# Patient Record
Sex: Male | Born: 1935 | Race: White | Hispanic: No | Marital: Married | Smoking: Never smoker
Health system: Southern US, Community
[De-identification: ages and names within clinical notes are randomized; demographics above are authoritative.]

## PROBLEM LIST (undated history)

## (undated) DIAGNOSIS — G250 Essential tremor: Secondary | ICD-10-CM

## (undated) DIAGNOSIS — C61 Malignant neoplasm of prostate: Secondary | ICD-10-CM

## (undated) DIAGNOSIS — Z950 Presence of cardiac pacemaker: Secondary | ICD-10-CM

## (undated) DIAGNOSIS — I509 Heart failure, unspecified: Secondary | ICD-10-CM

## (undated) DIAGNOSIS — N1330 Unspecified hydronephrosis: Secondary | ICD-10-CM

## (undated) DIAGNOSIS — Z8679 Personal history of other diseases of the circulatory system: Secondary | ICD-10-CM

## (undated) DIAGNOSIS — I4819 Other persistent atrial fibrillation: Secondary | ICD-10-CM

## (undated) DIAGNOSIS — I495 Sick sinus syndrome: Secondary | ICD-10-CM

## (undated) DIAGNOSIS — N289 Disorder of kidney and ureter, unspecified: Secondary | ICD-10-CM

## (undated) DIAGNOSIS — I4510 Unspecified right bundle-branch block: Secondary | ICD-10-CM

## (undated) DIAGNOSIS — D649 Anemia, unspecified: Secondary | ICD-10-CM

## (undated) DIAGNOSIS — R42 Dizziness and giddiness: Secondary | ICD-10-CM

## (undated) DIAGNOSIS — K5730 Diverticulosis of large intestine without perforation or abscess without bleeding: Secondary | ICD-10-CM

## (undated) DIAGNOSIS — Z9889 Other specified postprocedural states: Secondary | ICD-10-CM

## (undated) HISTORY — DX: Anemia, unspecified: D64.9

## (undated) HISTORY — PX: TONSILLECTOMY: SUR1361

## (undated) HISTORY — DX: Heart failure, unspecified: I50.9

## (undated) HISTORY — PX: CATARACT EXTRACTION: SUR2

## (undated) HISTORY — PX: CARDIAC ELECTROPHYSIOLOGY MAPPING AND ABLATION: SHX1292

## (undated) HISTORY — PX: CARDIOVERSION: SHX1299

## (undated) HISTORY — DX: Dizziness and giddiness: R42

---

## 2000-10-23 ENCOUNTER — Encounter (HOSPITAL_COMMUNITY): Admission: RE | Admit: 2000-10-23 | Discharge: 2001-01-21 | Payer: Self-pay | Admitting: Geriatric Medicine

## 2000-10-24 ENCOUNTER — Encounter: Payer: Self-pay | Admitting: Geriatric Medicine

## 2000-10-27 ENCOUNTER — Encounter: Payer: Self-pay | Admitting: Geriatric Medicine

## 2002-07-22 ENCOUNTER — Ambulatory Visit (HOSPITAL_COMMUNITY): Admission: RE | Admit: 2002-07-22 | Discharge: 2002-07-22 | Payer: Self-pay | Admitting: Gastroenterology

## 2003-06-03 ENCOUNTER — Ambulatory Visit: Admission: RE | Admit: 2003-06-03 | Discharge: 2003-06-03 | Payer: Self-pay | Admitting: Geriatric Medicine

## 2003-06-03 ENCOUNTER — Encounter (INDEPENDENT_AMBULATORY_CARE_PROVIDER_SITE_OTHER): Payer: Self-pay | Admitting: Cardiology

## 2003-08-25 ENCOUNTER — Ambulatory Visit: Admission: RE | Admit: 2003-08-25 | Discharge: 2003-09-28 | Payer: Self-pay | Admitting: Radiation Oncology

## 2003-10-28 ENCOUNTER — Ambulatory Visit: Admission: RE | Admit: 2003-10-28 | Discharge: 2004-01-19 | Payer: Self-pay | Admitting: Radiation Oncology

## 2003-12-21 ENCOUNTER — Ambulatory Visit (HOSPITAL_BASED_OUTPATIENT_CLINIC_OR_DEPARTMENT_OTHER): Admission: RE | Admit: 2003-12-21 | Discharge: 2003-12-21 | Payer: Self-pay | Admitting: Urology

## 2003-12-21 ENCOUNTER — Ambulatory Visit (HOSPITAL_COMMUNITY): Admission: RE | Admit: 2003-12-21 | Discharge: 2003-12-21 | Payer: Self-pay | Admitting: Urology

## 2003-12-21 HISTORY — PX: OTHER SURGICAL HISTORY: SHX169

## 2006-02-11 ENCOUNTER — Encounter: Admission: RE | Admit: 2006-02-11 | Discharge: 2006-02-27 | Payer: Self-pay | Admitting: Orthopedic Surgery

## 2006-09-04 ENCOUNTER — Ambulatory Visit (HOSPITAL_COMMUNITY): Admission: RE | Admit: 2006-09-04 | Discharge: 2006-09-04 | Payer: Self-pay | Admitting: Cardiology

## 2006-10-27 ENCOUNTER — Inpatient Hospital Stay (HOSPITAL_COMMUNITY): Admission: AD | Admit: 2006-10-27 | Discharge: 2006-10-27 | Payer: Self-pay | Admitting: Cardiology

## 2006-11-22 ENCOUNTER — Encounter: Admission: RE | Admit: 2006-11-22 | Discharge: 2006-11-22 | Payer: Self-pay | Admitting: Otolaryngology

## 2006-12-19 ENCOUNTER — Encounter: Admission: RE | Admit: 2006-12-19 | Discharge: 2006-12-19 | Payer: Self-pay | Admitting: Cardiology

## 2006-12-22 ENCOUNTER — Ambulatory Visit (HOSPITAL_COMMUNITY): Admission: RE | Admit: 2006-12-22 | Discharge: 2006-12-22 | Payer: Self-pay | Admitting: Cardiology

## 2006-12-22 HISTORY — PX: CARDIAC CATHETERIZATION: SHX172

## 2007-02-11 HISTORY — PX: KNEE ARTHROSCOPY: SUR90

## 2007-03-04 ENCOUNTER — Ambulatory Visit: Payer: Self-pay | Admitting: Internal Medicine

## 2007-04-02 ENCOUNTER — Ambulatory Visit: Payer: Self-pay | Admitting: Internal Medicine

## 2007-04-02 LAB — CONVERTED CEMR LAB
BUN: 25 mg/dL — ABNORMAL HIGH (ref 6–23)
Basophils Absolute: 0 10*3/uL (ref 0.0–0.1)
Basophils Relative: 0.6 % (ref 0.0–1.0)
Calcium: 9 mg/dL (ref 8.4–10.5)
Chloride: 106 meq/L (ref 96–112)
Creatinine, Ser: 1.2 mg/dL (ref 0.4–1.5)
Eosinophils Relative: 2 % (ref 0.0–5.0)
GFR calc Af Amer: 76 mL/min
GFR calc non Af Amer: 63 mL/min
Glucose, Bld: 176 mg/dL — ABNORMAL HIGH (ref 70–99)
HCT: 41.7 % (ref 39.0–52.0)
Hemoglobin: 14 g/dL (ref 13.0–17.0)
INR: 2.5 — ABNORMAL HIGH (ref 0.8–1.0)
MCHC: 33.6 g/dL (ref 30.0–36.0)
Monocytes Absolute: 0.3 10*3/uL (ref 0.2–0.7)
Neutro Abs: 5.6 10*3/uL (ref 1.4–7.7)
Neutrophils Relative %: 72.6 % (ref 43.0–77.0)
Sodium: 138 meq/L (ref 135–145)

## 2007-04-06 ENCOUNTER — Ambulatory Visit: Payer: Self-pay | Admitting: Internal Medicine

## 2007-04-06 ENCOUNTER — Inpatient Hospital Stay (HOSPITAL_COMMUNITY): Admission: RE | Admit: 2007-04-06 | Discharge: 2007-04-07 | Payer: Self-pay | Admitting: Internal Medicine

## 2007-04-08 ENCOUNTER — Ambulatory Visit: Payer: Self-pay | Admitting: Internal Medicine

## 2007-04-27 ENCOUNTER — Ambulatory Visit: Payer: Self-pay | Admitting: Internal Medicine

## 2007-04-27 LAB — CONVERTED CEMR LAB
Creatinine, Ser: 1.2 mg/dL (ref 0.4–1.5)
Eosinophils Absolute: 0.1 10*3/uL (ref 0.0–0.7)
GFR calc Af Amer: 76 mL/min
GFR calc non Af Amer: 63 mL/min
Hemoglobin: 14.2 g/dL (ref 13.0–17.0)
Lymphocytes Relative: 21.2 % (ref 12.0–46.0)
MCHC: 32.9 g/dL (ref 30.0–36.0)
Monocytes Absolute: 0.3 10*3/uL (ref 0.1–1.0)
Neutro Abs: 4.7 10*3/uL (ref 1.4–7.7)
Platelets: 181 10*3/uL (ref 150–400)
WBC: 6.6 10*3/uL (ref 4.5–10.5)
aPTT: 40.7 s — ABNORMAL HIGH (ref 21.7–29.8)

## 2007-05-05 ENCOUNTER — Ambulatory Visit (HOSPITAL_COMMUNITY): Admission: RE | Admit: 2007-05-05 | Discharge: 2007-05-06 | Payer: Self-pay | Admitting: Internal Medicine

## 2007-05-05 ENCOUNTER — Ambulatory Visit: Payer: Self-pay | Admitting: Internal Medicine

## 2007-05-05 HISTORY — PX: PACEMAKER INSERTION: SHX728

## 2007-05-21 ENCOUNTER — Ambulatory Visit: Payer: Self-pay

## 2007-09-22 ENCOUNTER — Ambulatory Visit: Payer: Self-pay | Admitting: Internal Medicine

## 2007-10-16 ENCOUNTER — Encounter: Admission: RE | Admit: 2007-10-16 | Discharge: 2007-10-16 | Payer: Self-pay | Admitting: Neurology

## 2007-10-20 ENCOUNTER — Encounter: Admission: RE | Admit: 2007-10-20 | Discharge: 2007-10-20 | Payer: Self-pay | Admitting: Neurology

## 2007-11-02 ENCOUNTER — Encounter: Admission: RE | Admit: 2007-11-02 | Discharge: 2007-11-02 | Payer: Self-pay | Admitting: Neurosurgery

## 2007-12-01 ENCOUNTER — Encounter: Admission: RE | Admit: 2007-12-01 | Discharge: 2007-12-01 | Payer: Self-pay | Admitting: Neurosurgery

## 2007-12-16 ENCOUNTER — Encounter (INDEPENDENT_AMBULATORY_CARE_PROVIDER_SITE_OTHER): Payer: Self-pay | Admitting: Neurosurgery

## 2007-12-16 ENCOUNTER — Inpatient Hospital Stay (HOSPITAL_COMMUNITY): Admission: RE | Admit: 2007-12-16 | Discharge: 2007-12-20 | Payer: Self-pay | Admitting: Neurosurgery

## 2007-12-16 HISTORY — PX: SUBDURAL HEMATOMA EVACUATION VIA CRANIOTOMY: SUR319

## 2008-01-01 ENCOUNTER — Ambulatory Visit: Payer: Self-pay | Admitting: Internal Medicine

## 2008-03-25 ENCOUNTER — Encounter: Admission: RE | Admit: 2008-03-25 | Discharge: 2008-03-25 | Payer: Self-pay | Admitting: Family Medicine

## 2008-05-02 ENCOUNTER — Encounter: Payer: Self-pay | Admitting: Internal Medicine

## 2008-05-12 ENCOUNTER — Encounter: Payer: Self-pay | Admitting: Cardiology

## 2008-05-12 ENCOUNTER — Ambulatory Visit: Payer: Self-pay | Admitting: Internal Medicine

## 2008-05-12 DIAGNOSIS — I495 Sick sinus syndrome: Secondary | ICD-10-CM | POA: Insufficient documentation

## 2008-05-12 DIAGNOSIS — I4891 Unspecified atrial fibrillation: Secondary | ICD-10-CM

## 2008-06-26 ENCOUNTER — Ambulatory Visit: Payer: Self-pay | Admitting: Internal Medicine

## 2008-06-27 ENCOUNTER — Ambulatory Visit: Payer: Self-pay | Admitting: Internal Medicine

## 2008-09-13 ENCOUNTER — Telehealth: Payer: Self-pay | Admitting: Internal Medicine

## 2008-09-16 ENCOUNTER — Ambulatory Visit: Payer: Self-pay | Admitting: Internal Medicine

## 2008-09-16 ENCOUNTER — Encounter: Payer: Self-pay | Admitting: Internal Medicine

## 2008-09-16 LAB — CONVERTED CEMR LAB
BUN: 23 mg/dL (ref 6–23)
Chloride: 107 meq/L (ref 96–112)
Glucose, Bld: 67 mg/dL — ABNORMAL LOW (ref 70–99)
Magnesium: 2.1 mg/dL (ref 1.5–2.5)
Potassium: 4.7 meq/L (ref 3.5–5.1)

## 2008-09-26 ENCOUNTER — Ambulatory Visit: Payer: Self-pay | Admitting: Internal Medicine

## 2008-12-01 ENCOUNTER — Ambulatory Visit (HOSPITAL_COMMUNITY): Admission: RE | Admit: 2008-12-01 | Discharge: 2008-12-01 | Payer: Self-pay | Admitting: Gastroenterology

## 2008-12-01 HISTORY — PX: COLONOSCOPY W/ POLYPECTOMY: SHX1380

## 2008-12-26 ENCOUNTER — Ambulatory Visit: Payer: Self-pay | Admitting: Internal Medicine

## 2009-03-27 ENCOUNTER — Ambulatory Visit: Payer: Self-pay | Admitting: Internal Medicine

## 2009-04-25 ENCOUNTER — Ambulatory Visit: Payer: Self-pay | Admitting: Internal Medicine

## 2009-04-25 DIAGNOSIS — I442 Atrioventricular block, complete: Secondary | ICD-10-CM | POA: Insufficient documentation

## 2009-06-26 ENCOUNTER — Encounter: Payer: Self-pay | Admitting: Internal Medicine

## 2009-09-25 ENCOUNTER — Ambulatory Visit: Payer: Self-pay | Admitting: Internal Medicine

## 2009-12-25 ENCOUNTER — Ambulatory Visit: Payer: Self-pay | Admitting: Internal Medicine

## 2010-02-13 NOTE — Cardiovascular Report (Signed)
Summary: TTM  TTM   Imported By: Roderic Ovens 02/03/2009 12:36:52  _____________________________________________________________________  External Attachment:    Type:   Image     Comment:   External Document

## 2010-02-13 NOTE — Assessment & Plan Note (Signed)
Summary: pacer check.gdt.amber  Medications Added ASPIRIN 81 MG TBEC (ASPIRIN) Take one tablet by mouth daily CIALIS 10 MG TABS (TADALAFIL) as needed      Allergies Added: NKDA  Primary Provider:  Hal Stoneking   History of Present Illness: [name] is seen in followup for pacemaker implanted for tachybradycardia syndrome. He is status post flutter ablation. He has some atrial fibrillation for which he takes flecainide. He had no symptoms of palpitations.  He has no complaints of chest pain or shortness of breath .  He is tolerating his medications well.  Current Medications (verified): 1)  Synthroid 125 Mcg Tabs (Levothyroxine Sodium) .... Take 1 By Mouth Once Daily 2)  Flecainide Acetate 100 Mg Tabs (Flecainide Acetate) .... Two Times A Day 3)  Pindolol 5 Mg Tabs (Pindolol) .... 1/2 Tab Two Times A Day 4)  Multivitamins  Tabs (Multiple Vitamin) .Marland Kitchen.. 1 Tab Once Daily 5)  Omega-3 350 Mg Caps (Omega-3 Fatty Acids) 6)  Glucosamine-Chondroitin  Caps (Glucosamine-Chondroit-Vit C-Mn) .... Take 1 By Mouth Two Times A Day 7)  Aspirin 81 Mg Tbec (Aspirin) .... Take One Tablet By Mouth Daily 8)  Cialis 10 Mg Tabs (Tadalafil) .... As Needed  Allergies (verified): No Known Drug Allergies  Vital Signs:  Patient profile:   77 year old male Height:      72 inches Weight:      179 pounds BMI:     24.36 Pulse rate:   60 / minute BP sitting:   100 / 65  (right arm) Cuff size:   regular  Vitals Entered By: [name], RMA (April 25, 2009 10:42 AM)  Physical Exam  General:  The patient was alert and oriented in no acute distress. HEENT Normal.  Neck veins were flat, carotids were brisk.  Lungs were clear.  Heart sounds were regular without murmurs or gallops.  Abdomen was soft with active bowel sounds. There is no clubbing cyanosis or edema. Skin Warm and dry    PPM Specifications Following MD:  Sherryl Manges, MD     [facility] Vendor:  [employer]     PPM Model Number:   [id number]     PPM Serial Number:  [id number] PPM DOI:  05/05/2007     PPM Implanting MD:  Sherryl Manges, MD  Lead 1    Location: RA     DOI: 05/05/2007     Model #: 0981     Serial #: [id number]     Status: active Lead 2    Location: RV     DOI: 05/05/2007     Model #: [id number]     Serial #: [id number]     Status: active  Magnet Response Rate:  BOL 100 ERI 85  Indications:  BRADYCARDIA   PPM Follow Up Remote Check?  No Battery Voltage:  good V     Battery Est. Longevity:  > 5 years     Pacer Dependent:  Yes       PPM Device Measurements Atrium  Impedance: 470 ohms, Threshold: 0.6 V at 0.4 msec Right Ventricle  Impedance: 540 ohms, Threshold: 0.7 V at 0.4 msec  Episodes MS Episodes:  0     Coumadin:  No Atrial Pacing:  89%     Ventricular Pacing:  92%  Parameters Mode:  DDDR     Lower Rate Limit:  60     Upper Rate Limit:  110 Paced AV Delay:  250     Next Cardiology Appt Due:  10/14/2009 Tech Comments:  No parameter changes.  Device function normal. Checked by industry.  TTM's with [employer].  ROV 6 months clinic. Altha Harm, LPN  April 25, 2009 11:00 AM   Impression & Recommendations:  Problem # 1:  ATRIAL FIBRILLATION (ICD-427.31) The patient has some atrial fibrillation comprising about 11% based on historogrAM/ device data at this point the options would include withdrawing the flecainide to see whether the frequency increased and if there are any associated symptoms, increase the drug to see if we could further suppress or to continue as we are. Given the paucity of symptoms he would like to do nothing.   His updated medication list for this problem includes:    Flecainide Acetate 100 Mg Tabs (Flecainide acetate) .Marland Kitchen..Marland Kitchen Two times a day    Pindolol 5 Mg Tabs (Pindolol) .Marland Kitchen... 1/2 tab two times a day    Aspirin 81 Mg Tbec (Aspirin) .Marland Kitchen... Take one tablet by mouth daily  Problem # 2:  PACEMAKER, PERMANENT-GUIDANT INSIGNIA 1297 (ICD-V45.01) Device parameters and data were reviewed and no  changes were made  Problem # 3:  AV BLOCK, COMPLETE (ICD-426.0) he has had progressive conduction system disease and now has no intrinsic rhythm at least during sinus. It appears that he conducts from atrium to the ventricle  during atrial fibrillation His updated medication list for this problem includes:    Flecainide Acetate 100 Mg Tabs (Flecainide acetate) .Marland Kitchen..Marland Kitchen Two times a day    Pindolol 5 Mg Tabs (Pindolol) .Marland Kitchen... 1/2 tab two times a day    Aspirin 81 Mg Tbec (Aspirin) .Marland Kitchen... Take one tablet by mouth daily

## 2010-02-13 NOTE — Cardiovascular Report (Signed)
Summary: Office Visit    Office Visit    Imported By: Roderic Ovens 04/25/2009 12:42:13  _____________________________________________________________________  External Attachment:    Type:   Image     Comment:   External Document

## 2010-02-13 NOTE — Cardiovascular Report (Signed)
Summary: TTM   TTM   Imported By: Roderic Ovens 07/24/2009 14:46:39  _____________________________________________________________________  External Attachment:    Type:   Image     Comment:   External Document

## 2010-02-13 NOTE — Cardiovascular Report (Signed)
Summary: TTM   TTM   Imported By: Roderic Ovens 04/05/2009 16:05:36  _____________________________________________________________________  External Attachment:    Type:   Image     Comment:   External Document

## 2010-02-13 NOTE — Cardiovascular Report (Signed)
Summary: TTM   TTM   Imported By: Roderic Ovens 10/25/2009 16:13:42  _____________________________________________________________________  External Attachment:    Type:   Image     Comment:   External Document

## 2010-02-15 NOTE — Cardiovascular Report (Signed)
Summary: TTM   TTM   Imported By: Roderic Ovens 01/12/2010 11:43:04  _____________________________________________________________________  External Attachment:    Type:   Image     Comment:   External Document

## 2010-02-16 NOTE — Cardiovascular Report (Signed)
Summary: TTM   TTM   Imported By: Roderic Ovens 07/24/2009 14:48:13  _____________________________________________________________________  External Attachment:    Type:   Image     Comment:   External Document

## 2010-03-26 ENCOUNTER — Encounter: Payer: Self-pay | Admitting: Internal Medicine

## 2010-03-26 DIAGNOSIS — I495 Sick sinus syndrome: Secondary | ICD-10-CM

## 2010-04-04 ENCOUNTER — Ambulatory Visit: Payer: Self-pay

## 2010-04-12 NOTE — Cardiovascular Report (Signed)
Summary: TTM   TTM   Imported By: Roderic Ovens 04/06/2010 14:42:56  _____________________________________________________________________  External Attachment:    Type:   Image     Comment:   External Document

## 2010-04-18 ENCOUNTER — Other Ambulatory Visit: Payer: Self-pay | Admitting: Internal Medicine

## 2010-05-18 ENCOUNTER — Other Ambulatory Visit: Payer: Self-pay | Admitting: Internal Medicine

## 2010-05-29 NOTE — Cardiovascular Report (Signed)
NAME:  [name], VISCOMI NO.:  [id number]   MEDICAL RECORD NO.:  [id number]          PATIENT TYPE:  OIB   LOCATION:  2854                         FACILITY:  [facility]   PHYSICIAN:  Armanda Magic, M.D.     DATE OF BIRTH:  28-Jun-1931   DATE OF PROCEDURE:  12/22/2006  DATE OF DISCHARGE:                            CARDIAC CATHETERIZATION   PROBLEMS INCLUDE:  1. Atrial fibrillation.  2. Chronic right bundle branch block.  3. Hypothyroidism.  4. Prostate CA.  5. Asymptomatic bradycardia.   PROCEDURE:  1. Left heart catheterization.  2. Coronary angiography.  3. Left ventriculography.   OPERATOR:  Armanda Magic, M.D.   INDICATIONS:  Atrial fibrillation, abnormal Cardiolite with thick  inferior wall defect.   COMPLICATIONS:  None.   IV ACCESS:  Via right femoral artery 6-French sheath.   IV MEDICATIONS:  Fentanyl 25 mcg IV.   This is a very pleasant 77 year old male with a history of prior chronic  right bundle branch block as well as paroxysmal atrial fibrillation  which has now become chronic atrial fibrillation.  He has had problems  with bradycardia in the past, and is now undergoing cardiac  catheterization to evaluate his inferior wall defect on Cardiolite prior  to starting him on flecainide.   The patient is brought to the cardiac catheterization laboratory in the  fasting nonsedated state.  Informed consent was obtained.  The patient  was connected to continuous heart rate pulse oximetry monitoring and  blood pressure monitoring.  The right groin was prepped and draped in a  sterile fashion.  Xylocaine 1% was used for local anesthesia.  Using  modified Seldinger technique a 6-French sheath was placed in the right  femoral artery.  Under fluoroscopic guidance a 6-French JL-4 catheter  was placed in the left coronary artery.  Multiple cine films were taken  at 30-degree RAO, and 40-degree LAO views.  His catheter was then  exchanged out over a guidewire for  a 6-French JR-4 catheter which was  placed under fluoroscopic guidance in the right coronary artery.  Multiple cine films were taken at 30-degree RAO, and 40-degree LAO  views.  The catheter was then exchanged out over a guidewire for a 6-  French angle pigtail catheter which was placed under fluoroscopic  guidance into the left ventricular cavity.   A left ventriculography was performed in the 30-degree RAO view using a  total of 30 mL of contrast at 15 mL per second.  The catheter was then  pulled back across the aortic valve with no significant gradient noted.  At the end of the procedure all catheters and sheaths were removed.  Manual compression was performed until adequate hemostasis was obtained.  The patient was transferred back to his room in stable condition.   RESULTS:  1. The left main coronary artery is widely patent and bifurcates into      the left anterior descending artery and left circumflex artery.  2. The left anterior descending artery is widely patent throughout its      course.  The apex gives rise to a first  diagonal branch which is      widely patent, and bifurcates into daughter branches both of which      are widely patent.  3. The left circumflex is widely patent throughout its course in the      AV groove.  It gives rise to a first small obtuse marginal branch,      and a second larger obtuse marginal II branch which bifurcates into      2 daughter branches and is widely patent.  It then gives off 3      other obtuse marginal branches all of which are widely patent.  4. The right coronary artery has luminal irregularities up to 20%, and      distally bifurcates into a posterior descending artery and      posterior lateral artery both of which are widely patent.   LEFT VENTRICULOGRAPHY:  Left ventriculography shows normal low normal LV  function EF 50%, aortic pressure 133/86 mmHg, LV pressure 132/5 mmHg,  LVEDP 5 mmHg.   ASSESSMENT:  1. Nonobstructive  coronary disease.  2. Low normal LV function EF 50% by cath, but echo and Cardiolite      showed EF well above 55%.  3. Atrial fibrillation rate control.   PLAN:  Restart Coumadin. discharge home after IV fluid and bedrest.  Will start flecainide as an outpatient at 50 mg b.i.d. and Pindolol 5 mg  a day for atrial fibrillation once INR is greater than 2 for four weeks  after restarting Coumadin.   FOLLOWUP:  With me in 4 weeks.      Armanda Magic, M.D.  Electronically Signed     TT/MEDQ  D:  12/22/2006  T:  12/22/2006  Job:  [id number]   cc:   Hal T. Stoneking, M.D.

## 2010-05-29 NOTE — Discharge Summary (Signed)
NAME:  [name], [name] NO.:  [id number]   MEDICAL RECORD NO.:  [id number]          PATIENT TYPE:  OIB   LOCATION:  6526                         FACILITY:  [facility]   PHYSICIAN:  Maple Mirza, PA   DATE OF BIRTH:  1931/09/28   DATE OF ADMISSION:  05/05/2007  DATE OF DISCHARGE:  05/06/2007                               DISCHARGE SUMMARY   ALLERGIES:  No known drug allergies.   Time for this dictation greater than 35 minutes.   FINAL DIAGNOSES:  1. Bradycardia after atrial flutter ablation March 2009, which is      symptomatic with fatigue and lethargy.  2. Discharging day one status post pacemaker implantation Medtronic      Insignia I dual-chamber pacemaker.  3. Chronic right bundle-branch block.   SECONDARY DIAGNOSES:  1. History of paroxysmal atrial fibrillation/flecainide therapy.  2. Iatrogenic hypothyroidism, treated.  3. History of prostate cancer.  4. Left heart catheterization December 22, 2006.  The LAD is patent.      The left circumflex is patent.  The right coronary artery has      luminal irregularities of about 22%.  Ejection fraction 50%.   PROCEDURE:  May 05, 2007, implant of the Medtronic [place] I dual-  chamber pacemaker, Dr. Sherryl Manges.  The patient has had no  postprocedural complications.  No hematoma at the pocket.  The device  interrogation well and chest x-ray shows no pneumothorax and the leads  are in appropriate position.  Of note, the patient has had the atrial  flutter ablation in the past.  This was done on April 06, 2007, Dr.  Sherryl Manges.  Even at that time, the patient did have bradycardia  status post ablation with a low steady recovery of sinus node function  over the ensuing 16 hours.   DISCHARGE MEDICATIONS:  The patient discharging on the following  medications:  1. Coumadin as before this admission.  2. Synthroid 112 mcg daily.  3. Flecainide 100 mg twice daily.  4. Pindolol 5 mg tablets one-half tablet twice  daily.  5. Multivitamin daily.  6. Lycopene 143 mg daily.  7. Omega-3 fatty acid daily.   He is asked to keep his incision dry for the next 7 days and to sponge  bathe until Tuesday May 12, 2007.  He follows up at [facility] Thursday May 21, 2007.  He  sees Dr. Graciela Husbands Tuesday August 25, 2007, at 9:40.   LAB WORK:  Pertinent to this admission was drawn on April 28, 2007.  White cells was 6.6, hemoglobin 14.2, hematocrit 43, and platelets  181,000.  Protime is 22.2, INR is 2, sodium is 140, potassium 4.7,  chloride 106, carbonate 27, glucose 103, BUN is 26, and creatinine 1.2.      Maple Mirza, PA     GM/MEDQ  D:  05/06/2007  T:  05/07/2007  Job:  [id number]   cc:   Duke Salvia, MD, [facility]  Hal T. Stoneking, M.D.

## 2010-05-29 NOTE — Assessment & Plan Note (Signed)
[facility]                         ELECTROPHYSIOLOGY OFFICE NOTE   NAME:[name], [name]                      MRN:          [id number]  DATE:04/27/2007                            DOB:          [birth year]    Mr. [name] is seen about a month following a flutter ablation which was  complicated by significant post termination pausing.  Because of ongoing  need for flecainide for atrial fibrillation control, he was treated with  pindolol.  He has had problems with exercise intolerance and some  lightheadedness.   MEDICATIONS:  1. Flecainide.  2. Coumadin.  3. Pindolol.   PHYSICAL EXAMINATION:  LUNGS:  Clear.  HEART:  His heart sounds were regular.  EXTREMITIES:  Without edema.   We then reviewed his event recorder strips which showed multiple  episodes of bradycardia with rates into the 30s.  He had no pauses of  greater than 3 seconds, notwithstanding the multiple slow heart rates.   IMPRESSION:  1. Chronotropic sinus bradycardia.  2. Status post flutter ablation.  3. Atrial fibrillation on flecainide and pindolol.   We will submit [name] for treadmill testing to assess chronotropic  competence.     Duke Salvia, MD, [facility]  Electronically Signed    SCK/MedQ  DD: 04/27/2007  DT: 04/27/2007  Job #: [id number]

## 2010-05-29 NOTE — Op Note (Signed)
NAME:  [name], STRUBLE NO.:  [id number]   MEDICAL RECORD NO.:  [id number]          PATIENT TYPE:  OIB   LOCATION:  2920                         FACILITY:  [facility]   PHYSICIAN:  Duke Salvia, MD, FACCDATE OF BIRTH:  May 22, 1931   DATE OF PROCEDURE:  04/06/2007  DATE OF DISCHARGE:                               OPERATIVE REPORT   PREOPERATIVE DIAGNOSIS:  Atrial fibrillation with atrial flutter  secondary to flecainide (1C therapy with a moderately rapid ventricular  response).   POSTOPERATIVE DIAGNOSIS:  Sinus rhythm.   PROCEDURE:  Invasive electrophysiological study, arrhythmia mapping, and  radiofrequency catheter ablation.   DESCRIPTION OF PROCEDURE:  Following the obtaining of informed consent,  the patient was brought to the electrophysiology laboratory and placed  on the fluoroscopic table in the supine position.  After routine after  routine prep and drape, cardiac catheterization was performed with local  anesthesia and conscious sedation.  Noninvasive blood pressure  monitoring, and transcutaneous oxygen saturation monitoring, and end-  tidal CO2 monitoring were performed continuously throughout the  procedure.  Following the procedure the catheter was removed.  Hemostasis was obtained, and the patient was transferred to the holding  area in stable condition.   Catheter was a 5-French quadripolar catheter which was inserted in the  left femoral vein at the AV junction.   A 6-French octapolar catheter was inserted into the right femoral vein  to the coronary sinus.   A 7-French dual decapolar catheter was inserted into the left femoral  vein to tricuspid annulus.   An 8-French 8-mm deflectable tip ablation catheter was inserted via the  right femoral vein using both an SL-2 and an S-AFL sheath to mapping  sites in the posterior septal space.   Surface leads I, aVF, and V-1 were monitored continuously throughout the  procedure.  Following insertion  of the catheters the stimulation  protocol included:  Entrainment mapping for both the tricuspid annulus  as well as the left atrium via the coronary sinus.   Incremental atrial pacing.   Incremental ventricular pacing.   Single atrial extra stimuli paced cycle length of 700 msec.   RESULTS ELECTROCARDIOGRAM:  INITIAL RHYTHM:  Atrial flutter; A interval:  334 msec;  RR interval 640-660 msec; QRS duration 206 msec; QT interval 412 msec; P-  wave duration N/A; bundle branch block:  Right/LS L AFB; pre-excitation:  Absent.  FINAL:  Rhythm:  Sinus; RR interval 1387 msec; PR interval 199 msec; QRS  duration:  N/M; QT interval 487 msec; P-wave duration:  148 msec;  AH interval approximately 100 msec;  H-interval 53 msec.  AV nodal function:  AV Wenckebach was 500 msec.  VA conduction was disassociated 600 msec.  AV nodal effective refractory, at a paced cycle length of 700 msecs, it  was 400  msec.   AV nodal conduction was discontinuous post ablation, but without echo  beats; and the discontinuity happened just prior to ERP.   ACCESSORY PATHWAY FUNCTION:  No evidence of accessory pathway was  identified.   VENTRICULAR RESPONSE TO PROGRAMMED STIMULATION:  Normal for ventricular  stimulation  as described.   Arrhythmias induced.  The patient presented to the lab in atrial  flutter.  Entrainment mapping was somewhat difficult because with  conduction slowing, I suspect, that he was encroaching upon his ERP and  post pacing intervals from the coronary sinus were very very long, but  post pacing intervals from the tricuspid annulus turned out to be about  tachycardia cycle length plus 20-30 msec.   Because of the physiology and the pharmacological effects, I presumed  that this was still isthmus dependence.  We then used radiofrequency  energy across the cavotricuspid isthmus.  With interruption of flutter,  the patient had 7 seconds of asystole.  Backup brady pacing was then   initiated.  Gradually he recovered a heart rate in the range of 30-40  beats per minute.   At this junction we noted that we continued to have cavotricuspid  isthmus conduction, and required a multitude more RF applications.  It  turned out that there was a ridge which could only be approached  retrograde; that is to say, with the catheter inverted.  RF, at this  point, resulted in conduction block.   FLUOROSCOPY TIME:  A total 14 minutes and 27 seconds; fluoroscopy was  utilized at 7.5 frames per second.   RADIOFREQUENCY ENERGY:  A total of 24 minutes and 12 seconds of RF  energy was applied across the cavotricuspid isthmus as noted previously.   IMPRESSION:  1. Prolonged sinus node recovery times with sinus bradycardia.  2. Abnormal atrial function manifested by an antecedent history of      atrial fibrillation with 1C therapy induced atrial flutter, status      post successful cavotricuspid isthmus conduction ablation.  3. Normal AV nodal function.  4. Normal His-Purkinje system function.  5. No accessory pathway.  6. Normal ventricular response to programmed stimulation.   SUMMARY AND CONCLUSIONS:  The results of electrophysiological testing  confirmed cavotricuspid isthmus conduction as the mechanism of Mr.  Broxson atrial flutter.  It was a difficult ablation because of the  prominent ridge.   The other notable was his prolonged sinus node recovery time, which may  be aggravated by his beta blockers which are unfortunately a concomitant  of his 1C therapy.  Bradycardia got better as we watched.  We will plan  to observe the patient in the ICU overnight with the intended decision  about pacemaker per implantation in the morning.   The patient tolerated the procedure without apparent complication.      Duke Salvia, MD, [facility]  Electronically Signed     SCK/MEDQ  D:  04/06/2007  T:  04/06/2007  Job:  [id number]   cc:   Armanda Magic, M.D.  Electrophysiology  laboratory

## 2010-05-29 NOTE — Procedures (Signed)
[facility]                              EXERCISE TREADMILL   NAME:[name], [name]                      MRN:          [id number]  DATE:04/27/2007                            DOB:          [birth year]    PROCEDURE:  Treadmill test.   [name] was submitted for treadmill testing for an assessment of  chronotropic competence.  He was exercised on a modification of a  standard Bruce protocol and in stage III, which was 3 minutes into  exercise, his blood pressure was noted to have fallen from 125 to 77.  Repeat was 77.  The test was terminated.  At that point, he had no  symptoms of lightheadedness.   IMPRESSION:  1. Chronotropic incompetence.  2. Hypotensive response to exercise because of the above.     Duke Salvia, MD, [facility]  Electronically Signed    SCK/MedQ  DD: 04/27/2007  DT: 04/27/2007  Job #: [id number]

## 2010-05-29 NOTE — Discharge Summary (Signed)
NAME:  [name], [name] NO.:  [id number]   MEDICAL RECORD NO.:  [id number]          PATIENT TYPE:  INP   LOCATION:  3032                         FACILITY:  [facility]   PHYSICIAN:  Danae Orleans. Venetia Maxon, M.D.  DATE OF BIRTH:  28-May-1931   DATE OF ADMISSION:  12/16/2007  DATE OF DISCHARGE:  12/20/2007                               DISCHARGE SUMMARY   PREOPERATIVE DIAGNOSIS:  Left chronic subdural hematoma.   POSTOPERATIVE DIAGNOSIS:  Left chronic subdural hematoma.   PROCEDURE:  Left craniotomy for subdural hematoma with evacuation of  subdural.   FINAL DIAGNOSIS:  Left chronic subdural hematoma.   HISTORY OF PRESENT ILLNESS AND HOSPITAL COURSE:  [name] is a 77-  year-old man who was admitted to the hospital with a subdural hematoma.  This has been going on for 2-1/2 months.  He had serial CT scans.  He  had been doing well, however, he developed progressive headaches and he  was admitted to the hospital and underwent uncomplicated operative  evacuation of the left-sided subdural hematoma.  Postoperatively, the  patient had a JP drain in place which was subsequently discontinued.  He  was observed in the ICU and then transferred to the floor.  Head CT on  December 18, 2007 showed good drainage.  His wounds were healing.  He was  doing well on December 20, 2007 and was discharged home in stable and  satisfactory condition with preoperative medications of flecainide  acetate 100 mg 2 times daily, Synthroid 125 mcg daily, pindolol 5 mg 1/2  tablet 3 times daily, multivitamin once daily, vitamin D once daily,  glucosamine and chondroitin daily, and fish oil 3 times weekly.  Additionally, his wife was given a prescription for Tylenol No. 3 one to  two every 4 hours as needed for pain.  He is instructed to follow up  with Dr. Franky Macho in the office on December 26, 2007 for staple removal.      Danae Orleans. Venetia Maxon, M.D.  Electronically Signed     JDS/MEDQ  D:  12/20/2007   T:  12/20/2007  Job:  [id number]

## 2010-05-29 NOTE — Letter (Signed)
March 04, 2007    [name], M.D.  [address], [address]  [place], [place] [address]   RE:  [name]  MRN:  [id number]  /  DOB:  Apr 22, 1931   Dear [name]:   It was a pleasure to see [name] at your request today regarding  his atrial fibrillation.   As you know he is a 77 year old gentleman with a history of atrial  fibrillation that dates back a couple years  to when he was initially  found to be hyperthyroid.  Underwent radioactive iodine therapy and  atrial fibrillation became quiescent until a couple years ago when he  started having paroxysms of it, which have become increasingly frequent.  He is not very good with the dates specifically, but he underwent echo  and Cardiolite evaluation which were negative.  Because of persistent  atrial fibrillation.  He underwent cardioversion in August 2008.  Shortly thereafter he developed recurrent atrial fibrillation and was  decided to put on flecainide.  Hospitalized and INR however was less  than 2.  This was deferred and subsequently there has been discussions  back and forth about the flecainide and finally he was loaded as an  outpatient following a catheterization demonstrating no obstructive  coronary disease as well as normal left ventricular function.   His symptoms with atrial fibrillation or mild.  He include has both  palpitations as well as decreased energy.  His wife says he also given  durable and he has less energy.   He is also has a history of exertional lightheadedness.  This predates  his atrial fibrillation as well as flecainide was noted when you sent  for treadmill test were apparently he got hypotensive.   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  His current medications in that regard  include pindolol.   His thromboembolic risk factors are broadly negative with a CHADS score  is zero.   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   PAST SURGICAL HISTORY:  Notable for knee arthroscopy and tonsillectomy  and the seeds for his prostate.   Review of systems is otherwise broadly negative.   SOCIAL HISTORY:  He is married.  He has 2 children and three  grandchildren.  He does not use cigarettes or recreational drugs.  Does  drink alcohol daily and this to 4 ounces a day.   MEDICATIONS:  Include Coumadin, Synthroid 112 flecainide 100 b.i.d.  pindolol 2.5 b.i.d. leg pain.   He has no known allergies.   EXAMINATION:  He is an elderly Caucasian male appearing stated age of  28.  His blood pressure 114/80, his pulse was 75 and rate irregular his  weight was 118.  HEENT exam demonstrated no icterus or xanthomata.  NECK:  Veins were flat.  The carotids are brisk and full bilaterally out  bruits.  BACK:  Without kyphosis, scoliosis.  Lungs were clear.  HEART:  Sounds were irregular without murmurs or gallops.  ABDOMEN:  Soft with active bowel sounds without midline pulsation or  hepatomegaly.  Femoral pulses were 2+ distal pulses were intact.  There is no clubbing, cyanosis or edema.  Neurological exam was grossly normal.  The skin was warm and dry.   Electrocardiogram dated today.  Interestingly, demonstrated atrial  flutter with atrial cycle length of approximately 300 milliseconds.  There is right bundle branch block.   Electrocardiogram from your office on January 28 demonstrated atrial  fibrillation with variable degrees of aberration but the P-waves which  were upright in lead V1 were quite irregular.   IMPRESSION:  1. Atrial flutter devolving from atrial fibrillation in the context of      flecainide therapy.  2. Thromboembolic risk factors notable for CHADS score 0.  3. Vasomotor instability with hypotension during exercise.  4. Normal heart by catheterization Myoview and echo.  5. Prostate cancer  with seed therapy.  6. Question of bradycardia and pindolol.   DISCUSSION:  [name], [name] has atrial fibrillation which is mildly  symptomatic and now has atrial flutter.  He is not sure that he can tell  the difference in the symptoms.  We will lengthy discussion regarding  rate control versus rhythm control all and as we have talked about  before given his modest symptoms the question these are as related to  his atrial fibrillation or to his rate.  As he is relatively young at  this point I would favor restoration of sinus rhythm and the maintenance  of sinus rhythm as we await the day as to the role of catheter ablation  in terms of reducing the risks of mortality and stroke with atrial  fibrillation in the absence of drug therapy.  I reviewed this  extensively with the family as well as the fact that with his CHADS  score of 0, apart from the peri-cardioversion time he does not need to  be on Coumadin.   Given the fact that he is evolved atrial flutter it would be optimistic  but not unheard of to be able to ablate his atrial flutter and have of  the flecainide maintained him in sinus rhythm at least for some period  of time.  The likelihood of that is 38-50% probably and the risks of  flutter ablation are about 01/998, and I reviewed these with the family  including the potential issues of heart block and vascular in the  understand these risks and they would like to proceed in that regard.   To that end, we will then plan to pursue catheter ablation of his atrial  flutter, maintain flecainide therapy to prevent atrial fibrillation,  continue on Coumadin peri-procedurally.   Thank you very much for allowing [place] to participate his care.    Sincerely,      Barry Salvia, MD, [facility]  Electronically Signed    SCK/MedQ  DD: 03/04/2007  DT: 03/05/2007  Job #: [id number]   CC:    [name], M.D.

## 2010-05-29 NOTE — Op Note (Signed)
NAME:  AUGUSTIN, BUN NO.:  [id number]   MEDICAL RECORD NO.:  [id number]          PATIENT TYPE:  INP   LOCATION:  3110                         FACILITY:  [facility]   PHYSICIAN:  Coletta Memos, M.D.     DATE OF BIRTH:  [birth year]   DATE OF PROCEDURE:  12/16/2007  DATE OF DISCHARGE:                               OPERATIVE REPORT   PREOPERATIVE DIAGNOSIS:  Left chronic subdural hematoma.   POSTOPERATIVE DIAGNOSIS:  Left chronic subdural hematoma.   PROCEDURE:  Left parietal craniotomy for hematoma evacuation.   COMPLICATIONS:  None.   DRAINS:  Drain left in subdural space.   INDICATIONS:  Ms. Seidel is a 77 year old gentleman who has had a  subdural hematoma for approximately last 2-1/2 months.  We have followed  it with serial CTs.  Up until this point in time, he did not want any  type of operative treatment.  Neurologically, he has been doing well.  He did have headaches, however.  Subsequently, he and his wife had  discussed it and felt that he should just go ahead with surgery.  Given  that he is admitted today for hematoma evacuation with craniotomy.   OPERATIVE NOTE:  Mr. Mccadden was brought to the operating room,  intubated, and placed under general anesthetic without difficulty.  A  Foley catheter was placed under sterile conditions.  His head was  positioned in a [place] 3-pin head holder after adequate anesthesia was  obtained.  His head was shaved.  He was then prepped in a sterile  fashion.  In the parietal boss on the left side, I made a linear  incision.  I then fashioned a craniotomy flap after placing Raney clips  along the skin edges and self-retaining retractors.  I turned to  craniotomy flap and opened the dura in a cruciate fashion and then  removed what was old blood.  Certainly, had the appearance of old blood.  Really, I did not strip the membranes, but I did open the membranes, so  I could get down to the cerebral surface.  I irrigated  copiously.  I  then placed a drain in the subdural space and reapproximated the dural  flap that was certainly not watertight.  I placed tack-up sutures around  the edges.  I replaced the craniotomy flap allowing for the drain, which  I placed subdurally and brought out through a separate exit site.  I  then reapproximated the bone flap using plates and screws.  I  reapproximated the scalp using Vicryl sutures and staples for the skin  edges, Vicryl for the subgaleal layer.  I applied a sterile dressing.  The patient then was extubated and moving all extremities.           ______________________________  Coletta Memos, M.D.     KC/MEDQ  D:  12/16/2007  T:  12/16/2007  Job:  [id number]

## 2010-05-29 NOTE — Assessment & Plan Note (Signed)
[facility]                         ELECTROPHYSIOLOGY OFFICE NOTE   NAME:FAISONCoreyon, Nicotra                      MRN:          [id number]  DATE:01/01/2008                            DOB:          05/13/1931    Mr. [name] is seen following his ablation for atrial flutter and  recurrent atrial fibrillation for which takes flecainide, and  bradycardia, status post pacemaker implantation.  He has had no  intercurrent atrial arrhythmias either by symptoms or by interrogation.   However, he has had an intercurrent subdural hematoma which was somewhat  chronic and underwent evacuation in early October.  He is obviously no  longer a candidate for Coumadin.   His CHADS VAS score is 1 based on his age of less than 26.   MEDICATIONS:  1. Atenolol 25 b.i.d.  2. Flecainide 100 b.i.d.  3. Synthroid 112.  4. Nutraceuticals.   PHYSICAL EXAMINATION:  VITAL SIGNS:  His blood pressure is 124/76 with a  pulse of 60.  His weight was 170 which is down 10 pounds.  LUNGS:  Clear.  HEART:  Sounds are regular.  EXTREMITIES:  Without edema.   Interrogation of his Guidant Insignia pulse generator demonstrates a P-  wave of 3.5 with impedance of 580.  The threshold was 0.8 at 0.4.  The R-  wave was 11.2 with impedance of 610.  The threshold was 0.6 at 0.4.  He  is 94% ventricularly paced.   IMPRESSION:  1. Bradycardia, status post pacemaker implantation.  2. Atrial flutter, status post ablation.  3. Atrial fibrillation for which takes flecainide.  4. Coumadin therapy for the above.  I discontinued because of the      intercurrent subdural hematoma that required evacuation.  5. CHADS VAS score of 1 with an ankle-brachial index, given age of      less than 13.   Mr. Pursell estimated stroke risk would be about 0.6% with a CHADS VAS  score of 1.  He will go up as he ages as we know.  However, clearly  Coumadin is no longer an option and his risk factors are not  sufficiently high to prompt more aggressive therapy for stroke risk  reduction i.e. left atrial appendage inclusion and/or left atrial  appendectomy at the time of Maze surgery.   It would be reasonable, however, if his neurosurgeon or neurologist  thinks it is appropriate to put him on aspirin for what ever risk  reduction we can accrue by yet.   We will see him again in 4 months' time.     Duke Salvia, MD, [facility]  Electronically Signed    SCK/MedQ  DD: 01/01/2008  DT: 01/02/2008  Job #: [id number]   cc:   Coletta Memos, M.D.  Hal T. Stoneking, M.D.

## 2010-05-29 NOTE — Letter (Signed)
September 22, 2007    Hal T. Stoneking, MD  [address] [place], [place] [address]   RE:  CRISTINO, DEGROFF  MRN:  [id number]  /  DOB:  02-05-1931   Dear Ann Maki,   Mr. Loughney comes in today complaining of dizziness in the setting of  flecainide therapy for atrial fibrillation and prior pacemaker  implantation.   He also asked about what to do about his Coumadin.  I recall that his  [place] score was 1 for borderline age at 37.   His other medications include pindolol, which was done in conjunction  with the flecainide and Coumadin and Synthroid.   On examination, his blood pressure is 104/70 with a pulse of 67.  His  weight was 180 which was stable.  His lungs were clear.  Heart sounds  were regular.  Extremities were without edema.   Interrogation of his Guidant pulse generator demonstrated a P-wave of  3.6 with impedance of 520, a threshold of 0.7 and 0.4, the R-wave was  11.5 with impedance of 610, threshold of 0.6 and 0.4.  The device was  reprogrammed.   IMPRESSION:  1. Tachybrady syndrome.  2. Status post pacer for the above.  3. Atrial fibrillation - paroxysmal.  4. Thromboembolic risk factors notable for borderline age.  5. Balance issues with some instability of gait.   Hal, the first issue is whether Mr. Loomer flecainide is contributing  to his neurological symptoms.  I suggest that we stop it and give it a 2-  4 week washout.  Reinstitution of antiarrhythmic drug therapy could be  then chosen depending on recurrent symptoms.   As he is not on flecainide, he does not need to be on pindolol, this is  designed to prevent 1:1 conduction of atrial flutter induced by the  flecainide which can go paradoxically quite rapidly.   The other issue is whether he had stopped his Coumadin.  Current  stratification algorithms we are using of the [place] score, he does not  have a [place] score of 1 at this point with his age.  A recent [facility] survey which you may  have seen in the Annals looks at the  value of Coumadin and stratified by age and interestingly in men 55-85.  There was little benefit and so with his age being borderline, I think  we could certainly err  in the side of no Coumadin.  Hence, I have told  him to stop it.   He is to let us know what happens to his neurological symptoms with a  discontinuation of his flecainide.     Sincerely,      Duke Salvia, MD, [facility]  Electronically Signed    SCK/MedQ  DD: 09/22/2007  DT: 09/23/2007  Job #: [id number]

## 2010-05-29 NOTE — Discharge Summary (Signed)
NAME:  [name], GOLDWATER NO.:  [id number]   MEDICAL RECORD NO.:  [id number]          PATIENT TYPE:  INP   LOCATION:  3709                         FACILITY:  [facility]   PHYSICIAN:  Jake Bathe, MD      DATE OF BIRTH:  1931/10/06   DATE OF ADMISSION:  10/27/2006  DATE OF DISCHARGE:  10/27/2006                               DISCHARGE SUMMARY   DISCHARGE DIAGNOSES:  1. Atrial flutter.  2. Hypothyroidism.  3. Right bundle branch block, chronic.  4. Long-term anticoagulation use.  5. Long-term medication use.   HOSPITAL COURSE:  Mr. [name] is a 77 year old male patient with a  history of paroxysmal atrial fibrillation, who is currently in atrial  fibrillation, and has had unsuccessful cardioversions in the past.  He  has been maintained on Coumadin, followed by Dr. Pete Glatter.   He was admitted on October 27, 2006, for flecainide loading.  When  reviewing his labs, he has been therapeutic with his INRs from June 17, 2006 to October 16, 2007, and on October 2, his INR was 1.8.  Today, his  INR was 2.3, but because he has not had 4 weeks of therapeutic INRs, we  must discharge him to home.  Our plan is for him to have therapeutic  INRs for at least 4 weeks, and then bring him back in for flecainide  loading given the fact that he would remain in atrial  fibrillation/flutter.   CONDITION ON DISCHARGE:  He is discharge to home in stable condition.  His condition is unchanged.   OTHER LAB STUDIES:  PTT 39, PT 26.3, INR 2.3.  Hemoglobin 14.1,  hematocrit 40.9, platelets 175, white count 4.7.   PENDING LAB TESTS:  Include a TSH and a CMP.  These need to be checked  and reviewed when he comes into the office.   MEDICATIONS:  He is to continue these medications:  1. Synthroid 125 mcg a day.  2. Coumadin as prior to admission.  3. Multivitamin daily.  4. Saw Palmetto daily.  5. Tomato extract daily.  6. Omega-3 daily.   FOLLOWUP:  Return to see Dr. Mayford Knife on November 17, 2006, at 3:15 p.m.   Please note, the patient's INR, again, was 2.1 on discharge today.      Guy Franco, P.A.      Jake Bathe, MD  Electronically Signed    LB/MEDQ  D:  10/27/2006  T:  10/27/2006  Job:  [id number]   cc:   Armanda Magic, M.D.

## 2010-05-29 NOTE — Op Note (Signed)
NAME:  [name], [name] NO.:  [id number]   MEDICAL RECORD NO.:  [id number]          PATIENT TYPE:  OIB   LOCATION:  2854                         FACILITY:  [facility]   PHYSICIAN:  Armanda Magic, M.D.     DATE OF BIRTH:  02/15/1931   DATE OF PROCEDURE:  09/04/2006  DATE OF DISCHARGE:                               OPERATIVE REPORT   PROBLEM LIST INCLUDES:  1. Symptomatic bradycardia.  2. Paroxysmal atrial fibrillation, now in atrial fibrillation.  3. Systemic anticoagulation with therapeutic INR hypothyroidism.   PROCEDURE:  Direct current cardioversion.   OPERATOR:  Armanda Magic, M.D.   INDICATIONS:  Atrial fibrillation.   COMPLICATIONS:  None.   IV MEDICATIONS:  Pentothal 250 mg IV.   HISTORY:  This is a 77 year old male with a history of paroxysmal atrial  fibrillation now in atrial fibrillation with a therapeutic INR for more  than 4 weeks.  He now presents for a cardioversion.   DESCRIPTION OF PROCEDURE:  The patient is brought to the day hospital in  a fasting, nonsedated state.  Informed consent was obtained.  The  patient was connected to continuous heart rate and pulse oximetry  monitoring, and intermittent blood pressure monitor.  Defibrillator pads  were placed on the anterior-and-posterior left chest.  After adequate  anesthesia was administered with 250 mg of IV Pentothal, a 120 joules,  biphasic, synchronized shock was delivered which successfully converted  the patient to sinus rhythm, and actually sinus bradycardia.  The  patient tolerated the procedure well without any complications.  Once  the patient was fully awake.  He was discharged to home.   ASSESSMENT:  1. Atrial fibrillation with controlled ventricular response.  2. Systemic anticoagulation with therapeutic INR.  3. Successful cardioversion to sinus brady.   PLAN:  Discharge to home after fully awake.  Follow up with me in 2  weeks for EKG.      Armanda Magic, M.D.  Electronically Signed     TT/MEDQ  D:  09/04/2006  T:  09/05/2006  Job:  [id number]   cc:   Hal T. Stoneking, M.D.

## 2010-05-29 NOTE — Op Note (Signed)
NAME:  RONELL, DUFFUS NO.:  [id number]   MEDICAL RECORD NO.:  [id number]          PATIENT TYPE:  OIB   LOCATION:  6526                         FACILITY:  [facility]   PHYSICIAN:  Duke Salvia, MD, FACCDATE OF BIRTH:  09-08-1931   DATE OF PROCEDURE:  05/05/2007  DATE OF DISCHARGE:                               OPERATIVE REPORT   PREOPERATIVE DIAGNOSIS:  Previously ablated atrial flutter with  significant sinus node dysfunction and bradycardia persistently in the  30s and 40s.   POSTOPERATIVE DIAGNOSIS:  Previously ablated atrial flutter with  significant sinus node dysfunction and bradycardia persistently in the  30s and 40s.   PROCEDURE:  Dual-chamber pacemaker implantation.   After obtaining informed consent, the patient was brought to the  electrophysiology laboratory, placed on the fluoroscopic table in supine  position.  After routine prep and drape of the left upper chest,  lidocaine was infiltrated into the prepectoral subclavicular region.  An  incision was made and carried down to the layer of device pocket using  sharp dissection and electrocautery.  A pocket was formed similarly.  Hemostasis was obtained.   Thereafter, the attention was turned gaining access to extrathoracic  left subclavian vein, which was accomplished without difficulty and  without the aspiration __________ puncture of the artery.  Two separate  venipunctures were accomplished.  Guidewires were placed and retained  and 0 silk suture was placed in a figure-of-eight fashion, allowed to  hang loosely.  Sequentially, a 7-French sheaths were placed, which were  passed, a Medtronic 5076, 58 cm active fixation, ventricular lead serial  number [id number] and Medtronic 5076, 52 cm active fixation, atrial lead  serial number [id number].  The ventricular lead was marked with a tie.  Under fluoroscopic guidance, multiple ventricular sites were mapped on  the septum at various locations from  just below the pulmonic valve all  the way down to the mid inferior septum.  Unfortunately, none of these  maintained R waves and none of them had adequate current of injury, and  because the patient was going to be atrially paced ultimately, we ended  up reverting to a RV apical site, where the bipolar R wave was 6.5 with  a pace impedance of 909 ohms, a threshold 0.4 volts at 0.5 milliseconds.  Current threshold 0.4 mV.  No diaphragmatic pacing at 10 volts and the  current of injury was brisk.   We also had a problem with atrium with multiple lead locations assessed.  Good amplitudes were seen in the area of Bachmann's bundle and the right  atrial appendage.  However, pacing could only be accomplished on the  right atrial free wall and so notwithstanding his INR of 2.8 we put the  lead over there where the amplitude was 3.0 with a pace impedance of 722  ohms, a threshold of 1 volt at 0.5 milliseconds, current threshold is  1.7 mA and the current of injury was brisk.  These leads were secured to  the prepectoral fascia, and then attached to a Guidant Insignia, model  [id number] pacemaker; serial number [id number].  Ventricular pacing  and AV pacing  were identified.  The leads and pulse generator were then placed in the  pocket, secured to the prepectoral fascia.  The pocket had been  copiously irrigated with antibiotic-containing saline solution and  hemostasis had been obtained.  There was just a tad of oozing which  looked to be coming from the incision, but I elected to put some  Surgicel on the cephalad border and the lateral border of the pocket,  and then closed the wound in three layers in the normal fashion.  The  wound was washed,  dried, and Benzoin and Steri-Strips dressings applied.  Needle counts,  sponge counts, and instrument counts were correct at the end of the  procedure according to the staff.  The patient tolerated the procedure  without apparent complication.      Duke Salvia, MD, [facility]  Electronically Signed     SCK/MEDQ  D:  05/05/2007  T:  05/06/2007  Job:  [id number]   cc:   [facility]  Electrophysiology Lab

## 2010-05-29 NOTE — Discharge Summary (Signed)
NAME:  [name], [name] NO.:  [id number]   MEDICAL RECORD NO.:  [id number]          PATIENT TYPE:  OIB   LOCATION:  2920                         FACILITY:  [facility]   PHYSICIAN:  Barry Salvia, MD, FACCDATE OF BIRTH:  [birth year]   DATE OF ADMISSION:  04/06/2007  DATE OF DISCHARGE:  04/07/2007                               DISCHARGE SUMMARY   Dictation and exam greater than 35 minutes.   FINAL DIAGNOSES:  1. Atrial fibrillation/flutter.  2. Cavo-tricuspid isthmus due to dependent atrial flutter status post      ablation April 06, 2007.      a.     Disrupting ridge on atrial wall requiring retrograde       difficult catheter maneuver for ablation.      b.     Asystole greater than 7 seconds during ablation at the time       of interruption of atrial flutter backup for AV pacing.      c.     Bradycardia post ablation with slow steady recovery of sinus       node function.  3. Vasomotor instability.   SECONDARY DIAGNOSES:  1. Paroxysmal atrial fibrillation quiescent on flecainide 1C      antiarrhythmic.  2. Treated hypothyroidism.   PROCEDURE:  April 06, 2007:  Electrophysiology study, radiofrequency  catheter ablation of a  cavo-tricuspid isthmus counterclockwise atrial flutter with  complications both during and after the procedure as mentioned above.   BRIEF HISTORY:  [name] is a 77 year old gentleman.  He has a history  of atrial fibrillation.  This was found during this patient's  hyperthyroid status.  He underwent radioactive iodine ablation.  His  atrial fibrillation was quiescent until a couple of years ago when he  started having paroxysms which have become increasingly frequent.  The  patient underwent cardioversion August 2008.  He did develop recurrent  atrial fibrillation and was placed on flecainide.  The patient has had a  catheterization in the past.  There was no obstructive coronary artery  disease.  The patient has normal left ventricular  function   Symptoms with atrial fibrillation are mild.  He has palpitations and  fatigue.  He also has a history of exertional lightheadedness.  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  Thromboembolic risk factors are negative.  His [place] score of zero.   PAST MEDICAL HISTORY:  Includes history of prostate cancer, arthritis  and treated iatrogenic hypothyroidism.  The patient has both atrial fib  and flutter.  The patient's fibrillation is well controlled with  flecainide.  The patient becomes hypotensive during exercise.   Plan would be to ablate atrial flutter and maintain the patient on  flecainide with a greater ensured probability that atrial fibrillation  will be under control.  The patient and family are willing to undergo  the risks which include heart block and they wish to proceed.  The plan  will be to pursue catheter ablation of atrial flutter, maintain  flecainide therapy to prevent recurrence of atrial fibrillation and  continue on Coumadin periprocedurally   HOSPITAL  COURSE:  The patient presents electively April 06, 2007.  He  underwent an electrophysiology study with radiofrequency catheter  ablation of a counterclockwise isthmus dependent atrial flutter.  It  required multiple radiofrequency catheter ablations and one reason for  this was that there was an atrial ridge which gave the atrial wall a  third dimension requiring catheter maneuvering which was somewhat  difficult.  The patient did have an episode of sinus arrest lasting  greater than 7 seconds.  He required backup pacing.  The patient was  also bradycardic with a heart rate in the 40s after the procedure after  successful ablation.  He was observed in the intensive care unit  overnight with slow recovery heart rate in the 60s by the morning of  postprocedure day number 1.  Throughout all this, the patient has been  asymptomatic and ready to discharge postprocedure day number 1.  His  Coumadin has  been therapeutic this admission.   DISCHARGE MEDICATIONS:  He goes home on the following medications:  1. Plendil 5 mg tablets one-half tablet twice daily.  2. Flecainide 100 mg twice daily.  3. Coumadin 7.5 alternating with 5 mg daily.  4. Synthroid 112 mcg daily.  5. Multivitamin daily.  6. Lycopene 143 mg daily.  7. Omega fish oil 500 mg 3 times per week.  8. Glucosamine chondroitin daily.   DISCHARGE INSTRUCTIONS:  1. He has follow up with [facility], [address]  2. Our office will call to get an event monitor.  3. Exercise treadmill study, Wednesday, May 06, 2007 at  11 a.m.  He      is asked to eat or drink nothing after 7:30 that day and to wear      comfortable shoes.  4. See Dr. Graciela Husbands Friday May 08, 2007 at 10:45 in the morning.   LABORATORY DATA:  White cells 7.5, hemoglobin 14, hematocrit 41.7,  platelets 185, pro-time 19.7, INR 2.5, sodium 138, potassium 4.3,  chloride 106, carbonate 25, glucose 176, BUN 25, creatinine 1.2.  These  labs were taken on April 02, 2007 at 8 a.m.  The patient may require  further study since his postprandial glucose is 176.      Maple Mirza, [place]      Barry Salvia, MD, [facility]  Electronically Signed    GM/MEDQ  D:  04/07/2007  T:  04/07/2007  Job:  [id number]   cc:   Barry Salvia, MD, [facility]  Hal T. Stoneking, M.D.  Armanda Magic, M.D.

## 2010-05-29 NOTE — Assessment & Plan Note (Signed)
[facility]                                 ON-CALL NOTE   NAME:FAISONPhelix, Fudala                      MRN:          [id number]  DATE:04/26/2007                            DOB:          [birth year]    TIME OF CALL:  Approximately 5:15.   HISTORY:  Cordelia Pen with [employer] called stating that Mr. Minus had  called in his recordings.  She states that his recordings were self  activated that the patient did not activate the monitor.  She states  that on review the telemetry shows sinus bradycardia anywhere from the  30s to 50s. Again, Cordelia Pen did state that these events were self  triggered, that they were not activated by the patient and the patient  has not had any problems or symptoms that may be associated with sinus  bradycardia.  I asked Cordelia Pen to please fax these to the office as per  routine instructions.     Joellyn Rued, PA-C  Electronically Signed    EW/MedQ  DD: 04/27/2007  DT: 04/27/2007  Job #: [id number]

## 2010-06-01 NOTE — Op Note (Signed)
   NAME:  COTEY, RAKES NO.:  [id number]   MEDICAL RECORD NO.:  [id number]                   PATIENT TYPE:  AMB   LOCATION:  ENDO                                 FACILITY:  [facility]   PHYSICIAN:  Danise Edge, M.D.                DATE OF BIRTH:  1931-03-04   DATE OF PROCEDURE:  07/22/2002  DATE OF DISCHARGE:                                 OPERATIVE REPORT   PROCEDURE PERFORMED:  Screening colonoscopy.   ENDOSCOPIST:  Charolett Bumpers, M.D.   INDICATIONS FOR PROCEDURE:  Mr. Stony Stegmann is a 77 year old male born  [birth year].  Mr. Pond is scheduled to undergo his first screening  colonoscopy with polypectomy to prevent colon cancer.   PREMEDICATION:  Versed 5 mg, Demerol 50 mg.   DESCRIPTION OF PROCEDURE:  After obtaining informed consent, Mr. Maglione was  placed in the left lateral decubitus position.  I administered intravenous  Demerol and intravenous Versed to achieve conscious sedation for the  procedure.  The patient's blood pressure, oxygen saturations and cardiac  rhythm were monitored throughout the procedure and documented in the medical  record.   Anal inspection was normal.  Digital rectal exam was normal.  The prostate  was non-nodular.  The Olympus adult colonoscope was introduced into the  rectum and easily advanced to the cecum.  A normal-appearing ileocecal valve  was intubated and the distal ileum inspected.  Colonic preparation for the  exam today was excellent.   Rectum:  Normal.   Sigmoid colon and descending colon:  Normal.   Splenic flexure:  Normal.   Transverse colon:  Normal.   Hepatic flexure:  Normal.   Ascending colon:  Normal.   Cecum and ileocecal valve:  Normal.   Distal ileum:  Normal.    ASSESSMENT:  Normal screening proctocolonoscopy to the cecum.  No endoscopic  evidence for the presence of colorectal neoplasia.                                                 Danise Edge, M.D.    MJ/MEDQ  D:  07/22/2002  T:  07/22/2002  Job:  [id number]   cc:   Hal T. Stoneking, M.D.  [address] [place], [place] [address]  Fax: [phone]

## 2010-06-01 NOTE — Op Note (Signed)
NAME:  [name], [name] NO.:  [id number]   MEDICAL RECORD NO.:  [id number]          PATIENT TYPE:  AMB   LOCATION:  NESC                         FACILITY:  [facility]   PHYSICIAN:  Ronald L. Ovidio Hanger, M.D.DATE OF BIRTH:  01-28-1931   DATE OF PROCEDURE:  12/21/2003  DATE OF DISCHARGE:                                 OPERATIVE REPORT   DIAGNOSIS:  Adenocarcinoma of the prostate.   OPERATION/PROCEDURE:  1.  Transperineal implantation of Iodine 125 seeds to the prostate.  2.  Flexible cystourethroscopy.   SURGEON:  Lucrezia Starch. Earlene Plater, M.D.   ASSISTANT:  Wynn Banker, M.D.   ANESTHESIA:  General endotracheal anesthesia.   ESTIMATED BLOOD LOSS:  10 mL.   TUBES:  16-French Foley.   COMPLICATIONS:  None.   A total of 44 iodine 125 seeds were implanted with 18 needles at 0.600 mCi  per seed.   INDICATIONS FOR PROCEDURE:  [name] is a very nice 77 year old white male  who presented with a PSA of 4.4 and underwent ultrasound and biopsy of the  prostate which revealed a Gleason's score of 6 which was 3 + 3 bilaterally.  He has considered all options carefully and understands risks, benefits and  alternatives. He has elected to proceed with seed implantation.  He has been  properly simulated and properly informed.   DESCRIPTION OF PROCEDURE:  The patient was placed in the supine position.  After proper general endotracheal anesthesia, was placed in the dorsal  lithotomy position and prepped and draped with Betadine in the sterile  fashion.  A 16-French Foley catheter was inserted and the bladder was  drained.  The balloon was inflated with 10 mL of contrast solution.  The  Siemens transrectal ultrasound probe was placed on the stepper device and  localized to preplanned coordinates.  By utilizing the Burdett device,  intraoperative dosimetry planning was performed, compared with preoperative  planning and seed location and augmentation was performed to smooth  out the  dosimetry.  Following this, red rubber catheter was placed in the rectum to  prevent flatus.  The physical and electronic grids were placed and two  holding needles were placed in unused coordinates, proper measurements  obtained and were localized both ultrasonically and fluoroscopically.  Seed  implantation was then performed in preplanned coordinates.  A total of 44  iodine 125 seeds were implanted with 18 needles at 0.600 mCi per seed.  Following implantation, we were comfortable, both fluoroscopically and with  ultrasound, with the locations of the seeds.  Transrectal ultrasound probe  along with red rubber catheter were removed.  Static images were obtained  fluoroscopically and the wound was dressed sterilely.  The patient was  placed in the supine position.  A 16-French Foley was removed and scanned  for seeds and there were none within it.  Flexible cystourethroscopy was  then performed with and Olympus flexible cystourethroscope.  He was noted to  have moderate trilobar hypertrophy with some obstruction but not severe.  He  had grade 1 trabeculation and efflux of clear urine was noted from the  normally placed ureteral orifices bilaterally and were no lesions or seeds  or spacers or strands in the bladder or the urethra.  The flexible  cystourethroscope was visually removed and a new 16-French Foley catheter  was inserted.  Bladder was drained.  The patient was taken to the recovery  room stable.     Rona   RLD/MEDQ  D:  12/21/2003  T:  12/21/2003  Job:  [id number]

## 2010-06-06 ENCOUNTER — Telehealth: Payer: Self-pay | Admitting: Internal Medicine

## 2010-06-06 ENCOUNTER — Encounter: Payer: Self-pay | Admitting: Internal Medicine

## 2010-06-06 NOTE — Telephone Encounter (Signed)
I spoke with the pt. I made him aware that I do not have any echo slots open tomorrow. He states Dr. Pete Glatter was supposed to refer for an echo as part of this appointment. I explained I do not have that request, so we will have him see Dr. Graciela Husbands tomorrow and then we will schedule the echo from that point if that is what is needed. He is agreeable.

## 2010-06-06 NOTE — Telephone Encounter (Signed)
Pt returned your call.  Please call him back

## 2010-06-06 NOTE — Telephone Encounter (Signed)
Pt wants to have an echo done when he comes in to see dr Graciela Husbands. Pt wants to talk to dr Graciela Husbands nurse.

## 2010-06-06 NOTE — Telephone Encounter (Signed)
LMTC

## 2010-06-07 ENCOUNTER — Encounter: Payer: Self-pay | Admitting: Internal Medicine

## 2010-06-07 ENCOUNTER — Ambulatory Visit (INDEPENDENT_AMBULATORY_CARE_PROVIDER_SITE_OTHER): Payer: Medicare Other | Admitting: Internal Medicine

## 2010-06-07 DIAGNOSIS — I059 Rheumatic mitral valve disease, unspecified: Secondary | ICD-10-CM

## 2010-06-07 DIAGNOSIS — I340 Nonrheumatic mitral (valve) insufficiency: Secondary | ICD-10-CM | POA: Insufficient documentation

## 2010-06-07 DIAGNOSIS — I4891 Unspecified atrial fibrillation: Secondary | ICD-10-CM

## 2010-06-07 DIAGNOSIS — Z950 Presence of cardiac pacemaker: Secondary | ICD-10-CM | POA: Insufficient documentation

## 2010-06-07 DIAGNOSIS — R42 Dizziness and giddiness: Secondary | ICD-10-CM | POA: Insufficient documentation

## 2010-06-07 DIAGNOSIS — I495 Sick sinus syndrome: Secondary | ICD-10-CM

## 2010-06-07 NOTE — Assessment & Plan Note (Signed)
No intercurrent atrial fibrillation; we discussed anticoagulation therapy. He has a CHADS VASC score of 2+. Long-term anticoagulation would be appropriate with ap forthcoming release of apixoban, however I would prefer until then to make the change.

## 2010-06-07 NOTE — Assessment & Plan Note (Signed)
It's been a couple of years; we will repeat his ultrasound.

## 2010-06-07 NOTE — Assessment & Plan Note (Signed)
stable °

## 2010-06-07 NOTE — Assessment & Plan Note (Signed)
The patient's device was interrogated.  The information was reviewed. No changes were made in the programming.    

## 2010-06-07 NOTE — Assessment & Plan Note (Signed)
We have discussed the physiology of the static intolerance and reviewed behavioral maneuvers with isometric contraction that might help mitigate this. We'll also plan to discontinue his pindolol initially used for tachybradycardia syndrome but now obviated by the presence of complete heart block

## 2010-06-07 NOTE — Progress Notes (Signed)
  HPI  [name] is a 77 y.o. male  seen in followup for pacemaker implanted for tachybradycardia syndrome. He is status post flutter ablation. He has some atrial fibrillation for which he takes flecainide. He had no symptoms of palpitations.  He has no complaints of chest pain or shortness of breath .   Past Medical History  Diagnosis Date  . Bradycardia     s/p PPM  . Atrial flutter     s/p ablation  . Atrial fibrillation     Past Surgical History  Procedure Date  . Pacemaker insertion 05/05/07    Guidant Insignia   . Total knee arthroplasty   . Tonsillectomy     Current Outpatient Prescriptions  Medication Sig Dispense Refill  . aspirin 81 MG tablet Take 81 mg by mouth daily.        . flecainide (TAMBOCOR) 100 MG tablet TAKE 1 TABLET BY MOUTH 2 TIMES A DAY  60 tablet  6  . Glucosamine-Chondroit-Vit C-Mn CAPS Take by mouth 2 (two) times daily.        Marland Kitchen levothyroxine (SYNTHROID, LEVOTHROID) 125 MCG tablet Take 125 mcg by mouth daily.        . Multiple Vitamin (MULTIVITAMIN) tablet Take 1 tablet by mouth daily.        . Omega-3 Fatty Acids (FISH OIL) 300 MG CAPS Take by mouth daily.        . pindolol (VISKEN) 5 MG tablet 1/2 TAB TWO TIMES A DAY  60 tablet  2  . Psyllium (RA FIBER SUPPLEMENT PO) Take by mouth. Three tablets each day (dietary fiber 6g- soluble fiber 6g)       . tadalafil (CIALIS) 10 MG tablet Take 10 mg by mouth daily as needed.          Allergies no known allergies  Review of Systems negative except from HPI and PMH  Physical Exam Well developed and well nourished in no acute distress HENT normal E scleral and icterus clear Neck Supple JVP flat; carotids brisk and full Clear to ausculation Regular rate and rhythm, no murmurs gallops or rub Soft with active bowel sounds No clubbing cyanosis and edema Alert and oriented, grossly normal motor and sensory function Skin Warm and Dry   Assessment and  Plan

## 2010-06-07 NOTE — Assessment & Plan Note (Signed)
He is 100% atrial and ventricularly paced.

## 2010-06-07 NOTE — Patient Instructions (Signed)
Your physician has requested that you have an echocardiogram. Echocardiography is a painless test that uses sound waves to create images of your heart. It provides your doctor with information about the size and shape of your heart and how well your heart's chambers and valves are working. This procedure takes approximately one hour. There are no restrictions for this procedure.  Your physician has recommended you make the following change in your medication:  1) Stop pindolol.  Your physician wants you to follow-up in: 6 months. You will receive a reminder letter in the mail two months in advance. If you don't receive a letter, please call our office to schedule the follow-up appointment.

## 2010-06-19 ENCOUNTER — Ambulatory Visit (HOSPITAL_COMMUNITY): Payer: Medicare Other | Attending: Internal Medicine | Admitting: Radiology

## 2010-06-19 DIAGNOSIS — I059 Rheumatic mitral valve disease, unspecified: Secondary | ICD-10-CM | POA: Insufficient documentation

## 2010-06-19 DIAGNOSIS — Z950 Presence of cardiac pacemaker: Secondary | ICD-10-CM

## 2010-06-19 DIAGNOSIS — I079 Rheumatic tricuspid valve disease, unspecified: Secondary | ICD-10-CM | POA: Insufficient documentation

## 2010-06-19 DIAGNOSIS — I495 Sick sinus syndrome: Secondary | ICD-10-CM

## 2010-06-25 ENCOUNTER — Encounter: Payer: Self-pay | Admitting: Internal Medicine

## 2010-06-25 DIAGNOSIS — I498 Other specified cardiac arrhythmias: Secondary | ICD-10-CM

## 2010-08-12 ENCOUNTER — Inpatient Hospital Stay (INDEPENDENT_AMBULATORY_CARE_PROVIDER_SITE_OTHER)
Admission: RE | Admit: 2010-08-12 | Discharge: 2010-08-12 | Disposition: A | Discharge status: Home / Self Care | Payer: Medicare Other | Source: Ambulatory Visit | Attending: Family Medicine | Admitting: Family Medicine

## 2010-08-12 ENCOUNTER — Ambulatory Visit (INDEPENDENT_AMBULATORY_CARE_PROVIDER_SITE_OTHER): Payer: Medicare Other

## 2010-08-12 DIAGNOSIS — S42009A Fracture of unspecified part of unspecified clavicle, initial encounter for closed fracture: Secondary | ICD-10-CM

## 2010-08-23 ENCOUNTER — Telehealth: Payer: Self-pay | Admitting: Internal Medicine

## 2010-08-23 NOTE — Telephone Encounter (Signed)
Pt needs to have orif left clavical and needs clearance please call [phone] ext [phone] fax# [phone]

## 2010-08-23 NOTE — Telephone Encounter (Signed)
Pt is for left clavicular ORIF

## 2010-08-23 NOTE — Telephone Encounter (Signed)
Pt has left clavicular fracture and is for an ORIF. Dr. Leslee Home wanted to know if pt needed to be seen prior to scheduling surgery as this is same site as his pacemaker. What precautions would be involved? Their office is aware Dr. Graciela Husbands will be back next week I will forward to Dr. Graciela Husbands and Herbert Seta

## 2010-08-28 ENCOUNTER — Telehealth: Payer: Self-pay | Admitting: Internal Medicine

## 2010-08-28 NOTE — Telephone Encounter (Signed)
Dr. Graciela Husbands called.  He has talked with Dr. Leslee Home today.  Dr. Leslee Home will contact [facility] regarding this matter. Mylo Red RN

## 2010-08-28 NOTE — Telephone Encounter (Signed)
Toniann Fail called regarding letter that was faxed 8-10 for surgical clearance, also made a call regarding the same.  They have gotten no response to either.  He has a fracture and needs to get repaired as soon as possible.  Aware being put in as urgent call.

## 2010-08-28 NOTE — Telephone Encounter (Signed)
In speaking with Dr. Graciela Husbands about urgent nature of surgical clearance he will contact Dr. Tracie Harrier office today. Mylo Red RN

## 2010-08-30 ENCOUNTER — Encounter (HOSPITAL_COMMUNITY): Payer: Medicare Other

## 2010-08-30 ENCOUNTER — Other Ambulatory Visit (HOSPITAL_COMMUNITY): Payer: Self-pay | Admitting: Orthopedic Surgery

## 2010-08-30 ENCOUNTER — Other Ambulatory Visit: Payer: Self-pay | Admitting: Orthopedic Surgery

## 2010-08-30 ENCOUNTER — Ambulatory Visit (HOSPITAL_COMMUNITY)
Admission: RE | Admit: 2010-08-30 | Discharge: 2010-08-30 | Disposition: A | Discharge status: Home / Self Care | Payer: Medicare Other | Source: Ambulatory Visit | Attending: Orthopedic Surgery | Admitting: Orthopedic Surgery

## 2010-08-30 DIAGNOSIS — Z01818 Encounter for other preprocedural examination: Secondary | ICD-10-CM

## 2010-08-30 DIAGNOSIS — Z01810 Encounter for preprocedural cardiovascular examination: Secondary | ICD-10-CM | POA: Insufficient documentation

## 2010-08-30 DIAGNOSIS — S42009A Fracture of unspecified part of unspecified clavicle, initial encounter for closed fracture: Secondary | ICD-10-CM | POA: Insufficient documentation

## 2010-08-30 DIAGNOSIS — Z950 Presence of cardiac pacemaker: Secondary | ICD-10-CM | POA: Insufficient documentation

## 2010-08-30 DIAGNOSIS — X58XXXA Exposure to other specified factors, initial encounter: Secondary | ICD-10-CM | POA: Insufficient documentation

## 2010-08-30 DIAGNOSIS — R9431 Abnormal electrocardiogram [ECG] [EKG]: Secondary | ICD-10-CM | POA: Insufficient documentation

## 2010-08-30 LAB — CBC
HCT: 37.1 % — ABNORMAL LOW (ref 39.0–52.0)
Hemoglobin: 12.5 g/dL — ABNORMAL LOW (ref 13.0–17.0)
MCHC: 33.7 g/dL (ref 30.0–36.0)
Platelets: 188 10*3/uL (ref 150–400)
RBC: 4.07 MIL/uL — ABNORMAL LOW (ref 4.22–5.81)
RDW: 13.1 % (ref 11.5–15.5)

## 2010-08-30 LAB — BASIC METABOLIC PANEL
BUN: 32 mg/dL — ABNORMAL HIGH (ref 6–23)
CO2: 23 mEq/L (ref 19–32)
Chloride: 105 mEq/L (ref 96–112)
Creatinine, Ser: 1.1 mg/dL (ref 0.50–1.35)
GFR calc non Af Amer: >60 mL/min (ref >60)
Glucose, Bld: 115 mg/dL — ABNORMAL HIGH (ref 70–99)
Potassium: 4.1 mEq/L (ref 3.5–5.1)
Sodium: 138 mEq/L (ref 135–145)

## 2010-08-30 LAB — SURGICAL PCR SCREEN: MRSA, PCR: NEGATIVE

## 2010-08-31 ENCOUNTER — Observation Stay (HOSPITAL_COMMUNITY)
Admission: RE | Admit: 2010-08-31 | Discharge: 2010-09-01 | Disposition: A | Discharge status: Home / Self Care | Payer: Medicare Other | Source: Ambulatory Visit | Attending: Orthopedic Surgery | Admitting: Orthopedic Surgery

## 2010-08-31 ENCOUNTER — Ambulatory Visit (HOSPITAL_COMMUNITY): Payer: Medicare Other

## 2010-08-31 DIAGNOSIS — Z01811 Encounter for preprocedural respiratory examination: Secondary | ICD-10-CM | POA: Insufficient documentation

## 2010-08-31 DIAGNOSIS — R9431 Abnormal electrocardiogram [ECG] [EKG]: Secondary | ICD-10-CM | POA: Insufficient documentation

## 2010-08-31 DIAGNOSIS — Z01810 Encounter for preprocedural cardiovascular examination: Secondary | ICD-10-CM | POA: Insufficient documentation

## 2010-08-31 DIAGNOSIS — X58XXXA Exposure to other specified factors, initial encounter: Secondary | ICD-10-CM | POA: Insufficient documentation

## 2010-08-31 DIAGNOSIS — S42023A Displaced fracture of shaft of unspecified clavicle, initial encounter for closed fracture: Principal | ICD-10-CM | POA: Insufficient documentation

## 2010-08-31 DIAGNOSIS — Z01812 Encounter for preprocedural laboratory examination: Secondary | ICD-10-CM | POA: Insufficient documentation

## 2010-08-31 HISTORY — PX: OTHER SURGICAL HISTORY: SHX169

## 2010-08-31 HISTORY — PX: ROTATOR CUFF REPAIR W/ DISTAL CLAVICLE EXCISION: SHX2365

## 2010-09-13 NOTE — Op Note (Signed)
NAME:  [name], [name] NO.:  [id number]  MEDICAL RECORD NO.:  [id number]  LOCATION:  1616                         FACILITY:  [facility]  PHYSICIAN:  Marlowe Kays, M.D.  DATE OF BIRTH:  [birth year]  DATE OF PROCEDURE:  08/31/2010 DATE OF DISCHARGE:                              OPERATIVE REPORT   PREOPERATIVE DIAGNOSIS:  Closed displaced distal left clavicle fracture.  POSTOPERATIVE DIAGNOSIS:  Closed displaced distal left clavicle fracture.  OPERATION:  ORIF of distal left clavicle fracture using two 5-mm Mersilene tapes wrapped around the clavicle and coracoid and supplemented with bone putty.  SURGEON:  Marlowe Kays, M.D.  ASSISTANT:  Nurse.  ANESTHESIA:  General.  INDICATIONS FOR PROCEDURE:  The fracture was far distal with just a several centimeters from the [facility] joint with the clavicle riding high and on serial x-rays there has been no sign that there is any decrease in the gap between the 2 bone fragments and I did not feel that this would heal without surgical assistance.  He also has a pacemaker and through the assistance of Dr. Hurman Horn his cardiologist, we were able to dismantle it prior to surgery and it will be reinstituted after surgery in the PACU.  DESCRIPTION OF PROCEDURE:  Prophylactic antibiotics, satisfied general anesthesia, beach-chair position on the Allen frame, left shoulder was prepped with DuraPrep, draped in sterile field.  Time-out performed.  IV had employed.  We made a curved incision along the mid to lateral clavicle centered at the coracoid and going down roughly to the [facility] joint.  The clavicle and fracture site were demarcated.  I cleared the fracture site of interposed soft tissue and correction of both ends of the fracture.  By elevating up the arm and pushing down distal clavicle what appeared to be close to nonanatomic reduction could be achieved.  I then undermined the clavicle posteriorly directly opposite the  coracoid process so that the tape would be arrived at the straight line and then undermined the clavicle anteriorly and placed a right angle hemostat from front to back off the undersurface of the clavicle and placed two 5- mm Mersilene tapes pulling them anteriorly.  I then dissected and after detaching part of the anterior deltoid and for exposure and found the coracoid process.  He has pacemaker wires just inferior to it and I was very careful to minimize any necessary dissection in that direction.  I then used the right angle hemostat once again to bring the anterior portions of the 2 tapes beneath the coracoid and up anteriorly once again.  With my scrub nurse holding the clavicle reduced and pushing up from the arm I then securely tied the 2 tapes.  I then placed a bone putty in the interval gap between the 2 fracture fragments and took a number of C-arm x-rays until it appeared that the bone putty was nicely filling in the gap which I was unable to close completely and satisfied that we had achieved best reduction possible.  The wound was irrigated with sterile saline taking care not to wash out the bone putty.  I infiltrated soft tissues with 0.25% Marcaine plain.  I then closed the wound  in layers with interrupted #1 Vicryl in the fascia and muscle overlying the clavicle 2-0 Vicryl subcutaneous tissue, staples in the skin.  Betadine, Adaptic, dry sterile dressing, and shoulder immobilizer were applied.  He tolerated the procedure well with no known complications during the case.  The regular Bovie did seem to give some activity to his EKG, so I tried to use a bipolar cautery as much as possible.          ______________________________ Marlowe Kays, M.D.     JA/MEDQ  D:  08/31/2010  T:  09/01/2010  Job:  [id number]  Electronically Signed by Marlowe Kays M.D. on 09/13/2010 10:52:14 AM

## 2010-09-24 ENCOUNTER — Encounter: Payer: Self-pay | Admitting: Internal Medicine

## 2010-09-24 DIAGNOSIS — I4892 Unspecified atrial flutter: Secondary | ICD-10-CM

## 2010-10-02 NOTE — Discharge Summary (Signed)
  NAME:  [name], BUHL NO.:  [id number]  MEDICAL RECORD NO.:  [id number]  LOCATION:                                 FACILITY:  PHYSICIAN:  Marlowe Kays, M.D.  DATE OF BIRTH:  05-Sep-1931  DATE OF ADMISSION:  08/31/2010 DATE OF DISCHARGE:  09/02/2010                              DISCHARGE SUMMARY   BRIEF HISTORY:  This gentleman fell on the left shoulder.  He was eventually seen, where x-rays showed a riding high clavicle at the [facility] joint.  There is a quite a gap between these two at the distal clavicle and was felt those are probably not healed due to the distance.  Dr. Graciela Husbands, [name]'s cardiologist, assisted as far as the management of the pacemaker during surgery.  On August 31, 2010, the patient underwent open reduction and internal fixation of distal left clavicle fracture using two 5 mm Mersilene tapes right around the clavicle and coracoid and supplemented with bony putty.  COURSE IN THE HOSPITAL:  The patient tolerated the surgical procedure quite well.  Neurovascularly remained intact in the operative extremity. He was fitted with a sling primarily for comfort.  Wound was clean and dry at discharge.  The patient was discharged to home on his home medication, which was: 1. Cialis 10 mg daily. 2. Glucosamine and chondroitin daily. 3. Enteric-coated aspirin 1 mg daily. 4. Synthroid 125 mcg daily. 5. Flecainide 100 mg dose b.i.d.  He is to follow up in the office approximately 2 weeks after date of surgery.  He is given mild analgesics for his discomfort.  He is to call should he have any problems or questions.     [name].   ______________________________ Marlowe Kays, M.D.    DLU/MEDQ  D:  09/26/2010  T:  09/27/2010  Job:  [id number]  cc:   Marlowe Kays, M.D. Fax: [phone]  Electronically Signed by Marlowe Kays M.D. on 10/02/2010 07:17:04 AM

## 2010-10-08 LAB — PROTIME-INR
INR: 2.8 — ABNORMAL HIGH
Prothrombin Time: 30.5 — ABNORMAL HIGH

## 2010-10-09 LAB — APTT: aPTT: 48 — ABNORMAL HIGH

## 2010-10-09 LAB — PROTIME-INR
INR: 2.8 — ABNORMAL HIGH
Prothrombin Time: 30.9 — ABNORMAL HIGH

## 2010-10-12 ENCOUNTER — Other Ambulatory Visit: Payer: Self-pay | Admitting: Neurology

## 2010-10-12 DIAGNOSIS — I6200 Nontraumatic subdural hemorrhage, unspecified: Secondary | ICD-10-CM

## 2010-10-16 ENCOUNTER — Ambulatory Visit
Admission: RE | Admit: 2010-10-16 | Discharge: 2010-10-16 | Disposition: A | Discharge status: Home / Self Care | Payer: Medicare Other | Source: Ambulatory Visit | Attending: Neurology | Admitting: Neurology

## 2010-10-16 DIAGNOSIS — I6200 Nontraumatic subdural hemorrhage, unspecified: Secondary | ICD-10-CM

## 2010-10-16 LAB — CBC
MCV: 97.5 fL (ref 78.0–100.0)
Platelets: 174 10*3/uL (ref 150–400)
WBC: 5.4 10*3/uL (ref 4.0–10.5)

## 2010-10-16 LAB — BASIC METABOLIC PANEL
BUN: 16 mg/dL (ref 6–23)
Chloride: 109 mEq/L (ref 96–112)
Creatinine, Ser: 1.07 mg/dL (ref 0.4–1.5)

## 2010-10-16 MED ORDER — IOHEXOL 300 MG/ML  SOLN
75.0000 mL | Freq: Once | INTRAMUSCULAR | Status: AC | PRN
  Administered 2010-10-16: 75 mL via INTRAVENOUS

## 2010-10-19 LAB — TYPE AND SCREEN: ABO/RH(D): O POS

## 2010-10-19 LAB — POCT I-STAT 7, (LYTES, BLD GAS, ICA,H+H)
Acid-base deficit: 2 mmol/L (ref 0.0–2.0)
O2 Saturation: 100 %
Potassium: 3.5 mEq/L (ref 3.5–5.1)
Sodium: 141 mEq/L (ref 135–145)
TCO2: 24 mmol/L (ref 0–100)

## 2010-10-25 LAB — COMPREHENSIVE METABOLIC PANEL
AST: 21
Alkaline Phosphatase: 44
BUN: 14
CO2: 24
Chloride: 110
Creatinine, Ser: 0.85
GFR calc non Af Amer: >60
Total Bilirubin: 1.1

## 2010-10-25 LAB — CBC
HCT: 40.9
MCV: 96.5
RBC: 4.24
WBC: 4.7

## 2010-10-25 LAB — APTT: aPTT: 39 — ABNORMAL HIGH

## 2010-10-25 LAB — PROTIME-INR: INR: 2.3 — ABNORMAL HIGH

## 2010-11-11 ENCOUNTER — Other Ambulatory Visit: Payer: Self-pay | Admitting: Internal Medicine

## 2010-12-05 ENCOUNTER — Encounter (HOSPITAL_COMMUNITY): Payer: Self-pay | Admitting: Pharmacy Technician

## 2010-12-14 ENCOUNTER — Encounter (HOSPITAL_COMMUNITY)
Admission: RE | Admit: 2010-12-14 | Discharge: 2010-12-14 | Disposition: A | Discharge status: Home / Self Care | Payer: Medicare Other | Source: Ambulatory Visit | Attending: Orthopedic Surgery | Admitting: Orthopedic Surgery

## 2010-12-14 ENCOUNTER — Encounter (HOSPITAL_COMMUNITY): Payer: Self-pay

## 2010-12-14 LAB — PROTIME-INR: Prothrombin Time: 14.3 seconds (ref 11.6–15.2)

## 2010-12-14 LAB — DIFFERENTIAL
Basophils Absolute: 0 10*3/uL (ref 0.0–0.1)
Eosinophils Absolute: 0.1 10*3/uL (ref 0.0–0.7)
Eosinophils Relative: 1 % (ref 0–5)

## 2010-12-14 LAB — URINALYSIS, ROUTINE W REFLEX MICROSCOPIC
Bilirubin Urine: NEGATIVE
Hgb urine dipstick: NEGATIVE
Nitrite: NEGATIVE
Specific Gravity, Urine: 1.01 (ref 1.005–1.030)
pH: 7 (ref 5.0–8.0)

## 2010-12-14 LAB — BASIC METABOLIC PANEL
CO2: 27 mEq/L (ref 19–32)
Calcium: 9.7 mg/dL (ref 8.4–10.5)
Creatinine, Ser: 1.14 mg/dL (ref 0.50–1.35)
Glucose, Bld: 78 mg/dL (ref 70–99)

## 2010-12-14 LAB — CBC
MCH: 31.4 pg (ref 26.0–34.0)
MCV: 92.3 fL (ref 78.0–100.0)
Platelets: 195 10*3/uL (ref 150–400)
RDW: 13.4 % (ref 11.5–15.5)

## 2010-12-14 LAB — APTT: aPTT: 35 seconds (ref 24–37)

## 2010-12-14 NOTE — Patient Instructions (Signed)
22 [name]  12/14/2010  Your procedure is scheduled on:12-18-10  Report to [facility] at 800 AM.  Call this number if you have problems the morning of surgery: [phone]   Remember:   Do not eat food:After Midnight.  May have clear liquids:until Midnight .  Clear liquids include soda, tea, black coffee, apple or grape juice, broth.  Take these medicines the morning of surgery with A SIP OF WATER: flecainide   Do not wear jewelry, make-up or nail polish.  Do not wear lotions, powders, or perfumes.   Do not shave 48 hours prior to surgery.  Do not bring valuables to the hospital.  Contacts, dentures or bridgework may not be worn into surgery.  Leave suitcase in the car. After surgery it may be brought to your room.  For patients admitted to the hospital, checkout time is 11:00 AM the day of discharge.   Patients discharged the day of surgery will not be allowed to drive home.  Special Instructions: CHG Shower Use Special Wash: 1/2 bottle night before surgery and 1/2 bottle morning of surgery.neck down avoid private area   Please read over the following fact sheets that you were given: MRSA Information, blood fact sheet, incentive spirometer fact sheet  Jasmine December [name] rn [facility] pre op nurse phone [phone] call if needed

## 2010-12-14 NOTE — Pre-Procedure Instructions (Addendum)
Medical clearance note dr Pete Glatter on chart Chest 2 view x ray in epic under imaging ekg 08-30-10 in epic under media Echo 06-19-10 in epic under procedures Last cardiology office visit note dr Graciela Husbands 06-07-10 in epic under encounters spoke with [facility] scientific rep, aware of 12-18-10 surgery and post op interrogation needed by [employer] rep, pacu to call [phone]-cardiac rep joey deakins when needed 12-18-10

## 2010-12-17 NOTE — H&P (Signed)
[name] is an 77 y.o. male.    Chief Complaint: left knee OA and pain   HPI: Pt is a 77 y.o. male complaining of left knee pain for 4-5 years. Pain had continually increased since the beginning. Initially the pt had a tear of the meniscus and had an arthroscopy in January 2008.  X-rays in the clinic show end-stage arthritic changes of the left knee. Pt has tried various conservative treatments which have failed to alleviate their symptoms. Various options are discussed with the patient. Risks, benefits and expectations were discussed with the patient. Patient understand the risks, benefits and expectations and wishes to proceed with surgery.   PCP:  Ginette Otto, MD, MD  D/C Plans: Home with HHPT  Post-op Meds: Rx given for ASA, Robaxin, Celebrex, Iron, Colace and MiraLax  Tranexamic Acid: Not to be given  PMH: Past Medical History  Diagnosis Date  . Bradycardia     s/p PPM  . Atrial flutter     s/p ablation  . Atrial fibrillation   . Prostate cancer 12-21-2003    12-7-seed implants done    PSH: Past Surgical History  Procedure Date  . Tonsillectomy as child  . Arthrscopic left knee surgery 02-11-2007  . Cardioversion 09-04-2006  . Av node ablation 04-06-2007  . Subdural hematoma evacuation via craniotomy 12-16-2007  . Rotator cuff repair w/ distal clavicle excision 08-31-2010    left  . Pacemaker insertion     Guidant Insignia     Social History:  reports that he has never smoked. He has never used smokeless tobacco. He reports that he drinks alcohol. He reports that he does not use illicit drugs.  Allergies:  No Known Allergies  Medications: No current facility-administered medications on file as of .   Medications Prior to Admission  Medication Sig Dispense Refill  . aspirin 81 MG tablet Take 81 mg by mouth at bedtime.       . flecainide (TAMBOCOR) 100 MG tablet TAKE 1 TABLET BY MOUTH 2 TIMES A DAY  60 tablet  6  . Glucosamine-Chondroit-Vit C-Mn CAPS  Take by mouth 2 (two) times daily.        Marland Kitchen levothyroxine (SYNTHROID, LEVOTHROID) 125 MCG tablet Take 125 mcg by mouth every evening.       . Multiple Vitamin (MULTIVITAMIN) tablet Take 1 tablet by mouth daily.        . Omega-3 Fatty Acids (FISH OIL) 300 MG CAPS Take by mouth daily.        . Psyllium (RA FIBER SUPPLEMENT PO) Take by mouth. Three tablets each day (dietary fiber 6g- soluble fiber 6g)       . tadalafil (CIALIS) 10 MG tablet Take 10 mg by mouth daily as needed.         ROS: Review of Systems  Constitutional: Negative.   HENT: Negative.   Eyes: Negative.   Respiratory: Negative.   Cardiovascular: Negative.   Gastrointestinal: Negative.   Genitourinary: Positive for frequency.  Musculoskeletal: Positive for joint pain.  Skin: Negative.   Neurological: Negative.   Endo/Heme/Allergies: Negative.   Psychiatric/Behavioral: Negative.    Physican Exam: Physical Exam  Constitutional: He is oriented to person, place, and time and well-developed, well-nourished, and in no distress. No distress.  HENT:  Head: Normocephalic.  Eyes: Conjunctivae and EOM are normal. Pupils are equal, round, and reactive to light.  Neck: Neck supple. No JVD present. No tracheal deviation present. No thyromegaly present.  Cardiovascular: Normal rate, regular rhythm  and normal heart sounds.  Exam reveals no gallop and no friction rub.   No murmur heard. Pulmonary/Chest: Effort normal and breath sounds normal. No stridor. No respiratory distress. He has no wheezes. He has no rales. He exhibits no tenderness.  Abdominal: Soft. There is no tenderness. There is no guarding.  Musculoskeletal:       Left knee: He exhibits decreased range of motion and swelling. He exhibits no ecchymosis and no erythema. tenderness found.  Lymphadenopathy:    He has no cervical adenopathy.  Neurological: He is alert and oriented to person, place, and time.  Skin: Skin is warm and dry. No rash noted. He is not diaphoretic.  No erythema. No pallor.  Psychiatric: Affect normal.      Assessment/Plan Assessment: left knee OA and pain   Plan: Patient will undergo a left total knee arthroplasty on 12/04.2012. Risks benefits and expectation were discussed with the patient. Patient understand risks, benefits and expectation and wishes to proceed.   Anastasio Auerbach Rieley Hausman   PAC  12/17/2010, 10:00 AM

## 2010-12-18 ENCOUNTER — Encounter (HOSPITAL_COMMUNITY): Payer: Self-pay | Admitting: *Deleted

## 2010-12-18 ENCOUNTER — Inpatient Hospital Stay (HOSPITAL_COMMUNITY): Payer: Medicare Other | Admitting: Certified Registered Nurse Anesthetist

## 2010-12-18 ENCOUNTER — Inpatient Hospital Stay (HOSPITAL_COMMUNITY)
Admission: RE | Admit: 2010-12-18 | Discharge: 2010-12-21 | DRG: 470 | Disposition: A | Discharge status: Skilled Nursing Facility | Payer: Medicare Other | Source: Ambulatory Visit | Attending: Orthopedic Surgery | Admitting: Orthopedic Surgery

## 2010-12-18 ENCOUNTER — Encounter (HOSPITAL_COMMUNITY): Payer: Self-pay | Admitting: Certified Registered Nurse Anesthetist

## 2010-12-18 ENCOUNTER — Encounter (HOSPITAL_COMMUNITY)
Admission: RE | Disposition: A | Discharge status: Skilled Nursing Facility | Payer: Self-pay | Source: Ambulatory Visit | Attending: Orthopedic Surgery

## 2010-12-18 DIAGNOSIS — F19921 Other psychoactive substance use, unspecified with intoxication with delirium: Secondary | ICD-10-CM | POA: Diagnosis not present

## 2010-12-18 DIAGNOSIS — I4891 Unspecified atrial fibrillation: Secondary | ICD-10-CM

## 2010-12-18 DIAGNOSIS — Z96659 Presence of unspecified artificial knee joint: Secondary | ICD-10-CM

## 2010-12-18 DIAGNOSIS — M1710 Unilateral primary osteoarthritis, unspecified knee: Principal | ICD-10-CM | POA: Diagnosis present

## 2010-12-18 DIAGNOSIS — E039 Hypothyroidism, unspecified: Secondary | ICD-10-CM | POA: Diagnosis present

## 2010-12-18 DIAGNOSIS — D649 Anemia, unspecified: Secondary | ICD-10-CM | POA: Diagnosis not present

## 2010-12-18 DIAGNOSIS — Z01812 Encounter for preprocedural laboratory examination: Secondary | ICD-10-CM

## 2010-12-18 DIAGNOSIS — I442 Atrioventricular block, complete: Secondary | ICD-10-CM

## 2010-12-18 DIAGNOSIS — T50995A Adverse effect of other drugs, medicaments and biological substances, initial encounter: Secondary | ICD-10-CM | POA: Diagnosis not present

## 2010-12-18 DIAGNOSIS — Z950 Presence of cardiac pacemaker: Secondary | ICD-10-CM

## 2010-12-18 HISTORY — PX: TOTAL KNEE ARTHROPLASTY: SHX125

## 2010-12-18 SURGERY — ARTHROPLASTY, KNEE, TOTAL
Anesthesia: Spinal | Site: Knee | Laterality: Left | Wound class: Clean

## 2010-12-18 MED ORDER — LACTATED RINGERS IV SOLN: INTRAVENOUS | Status: DC

## 2010-12-18 MED ORDER — ONDANSETRON HCL 4 MG PO TABS: 4.0000 mg | ORAL_TABLET | Freq: Four times a day (QID) | ORAL | Status: DC | PRN

## 2010-12-18 MED ORDER — LEVOTHYROXINE SODIUM 125 MCG PO TABS
125.0000 ug | ORAL_TABLET | Freq: Every evening | ORAL | Status: DC
  Administered 2010-12-18 – 2010-12-20 (×3): 125 ug via ORAL
  Filled 2010-12-18 (×4): qty 1

## 2010-12-18 MED ORDER — METHOCARBAMOL 500 MG PO TABS
500.0000 mg | ORAL_TABLET | Freq: Four times a day (QID) | ORAL | Status: DC | PRN
  Administered 2010-12-19 – 2010-12-20 (×3): 500 mg via ORAL
  Filled 2010-12-18 (×3): qty 1

## 2010-12-18 MED ORDER — CEFAZOLIN SODIUM 1-5 GM-% IV SOLN
INTRAVENOUS | Status: DC | PRN
  Administered 2010-12-18: 1 g via INTRAVENOUS

## 2010-12-18 MED ORDER — PROMETHAZINE HCL 25 MG/ML IJ SOLN: 6.2500 mg | INTRAMUSCULAR | Status: DC | PRN

## 2010-12-18 MED ORDER — MENTHOL 3 MG MT LOZG
1.0000 | LOZENGE | OROMUCOSAL | Status: DC | PRN
  Filled 2010-12-18: qty 9

## 2010-12-18 MED ORDER — LACTATED RINGERS IV SOLN
INTRAVENOUS | Status: DC
  Administered 2010-12-18: 125 mL/h via INTRAVENOUS

## 2010-12-18 MED ORDER — FENTANYL CITRATE 0.05 MG/ML IJ SOLN
INTRAMUSCULAR | Status: DC | PRN
  Administered 2010-12-18 (×4): 25 ug via INTRAVENOUS
  Administered 2010-12-18: 50 ug via INTRAVENOUS
  Administered 2010-12-18: 25 ug via INTRAVENOUS
  Administered 2010-12-18: 50 ug via INTRAVENOUS

## 2010-12-18 MED ORDER — HYDROCODONE-ACETAMINOPHEN 7.5-325 MG PO TABS
1.0000 | ORAL_TABLET | ORAL | Status: DC | PRN
  Administered 2010-12-18: 1 via ORAL
  Administered 2010-12-19 – 2010-12-21 (×12): 2 via ORAL
  Filled 2010-12-18 (×5): qty 2
  Filled 2010-12-18: qty 1
  Filled 2010-12-18 (×7): qty 2

## 2010-12-18 MED ORDER — ACETAMINOPHEN 10 MG/ML IV SOLN
INTRAVENOUS | Status: DC | PRN
  Administered 2010-12-18: 1000 mg via INTRAVENOUS

## 2010-12-18 MED ORDER — ALUM & MAG HYDROXIDE-SIMETH 200-200-20 MG/5ML PO SUSP: 30.0000 mL | ORAL | Status: DC | PRN

## 2010-12-18 MED ORDER — CEFAZOLIN SODIUM 1-5 GM-% IV SOLN: 1.0000 g | INTRAVENOUS | Status: DC

## 2010-12-18 MED ORDER — LACTATED RINGERS IV SOLN
INTRAVENOUS | Status: DC | PRN
  Administered 2010-12-18 (×2): via INTRAVENOUS

## 2010-12-18 MED ORDER — SODIUM CHLORIDE 0.9 % IR SOLN
Status: DC | PRN
  Administered 2010-12-18: 1000 mL

## 2010-12-18 MED ORDER — KETOROLAC TROMETHAMINE 30 MG/ML IJ SOLN
INTRAMUSCULAR | Status: DC | PRN
  Administered 2010-12-18 (×2): 30 mg

## 2010-12-18 MED ORDER — PHENOL 1.4 % MT LIQD
1.0000 | OROMUCOSAL | Status: DC | PRN
Start: 2010-12-18 — End: 2010-12-21
  Filled 2010-12-18: qty 177

## 2010-12-18 MED ORDER — LIDOCAINE HCL (CARDIAC) 20 MG/ML IV SOLN
INTRAVENOUS | Status: DC | PRN
  Administered 2010-12-18: 60 mg via INTRAVENOUS

## 2010-12-18 MED ORDER — DOCUSATE SODIUM 100 MG PO CAPS
100.0000 mg | ORAL_CAPSULE | Freq: Two times a day (BID) | ORAL | Status: DC
  Administered 2010-12-18 – 2010-12-21 (×6): 100 mg via ORAL
  Filled 2010-12-18 (×7): qty 1

## 2010-12-18 MED ORDER — ONDANSETRON HCL 4 MG/2ML IJ SOLN
4.0000 mg | Freq: Four times a day (QID) | INTRAMUSCULAR | Status: DC | PRN
  Administered 2010-12-19: 4 mg via INTRAVENOUS
  Filled 2010-12-18: qty 2

## 2010-12-18 MED ORDER — METHOCARBAMOL 100 MG/ML IJ SOLN
500.0000 mg | Freq: Four times a day (QID) | INTRAVENOUS | Status: DC | PRN
  Filled 2010-12-18: qty 5

## 2010-12-18 MED ORDER — CEFAZOLIN SODIUM 1-5 GM-% IV SOLN
1.0000 g | Freq: Four times a day (QID) | INTRAVENOUS | Status: AC
  Administered 2010-12-18 – 2010-12-19 (×3): 1 g via INTRAVENOUS
  Filled 2010-12-18 (×3): qty 50

## 2010-12-18 MED ORDER — FERROUS SULFATE 325 (65 FE) MG PO TABS
325.0000 mg | ORAL_TABLET | Freq: Three times a day (TID) | ORAL | Status: DC
  Administered 2010-12-18 – 2010-12-21 (×9): 325 mg via ORAL
  Filled 2010-12-18 (×11): qty 1

## 2010-12-18 MED ORDER — FLECAINIDE ACETATE 100 MG PO TABS
100.0000 mg | ORAL_TABLET | Freq: Two times a day (BID) | ORAL | Status: DC
  Administered 2010-12-18 – 2010-12-21 (×6): 100 mg via ORAL
  Filled 2010-12-18 (×8): qty 1

## 2010-12-18 MED ORDER — BUPIVACAINE-EPINEPHRINE PF 0.25-1:200000 % IJ SOLN
INTRAMUSCULAR | Status: DC | PRN
  Administered 2010-12-18 (×2): 60 mL

## 2010-12-18 MED ORDER — ZOLPIDEM TARTRATE 5 MG PO TABS: 5.0000 mg | ORAL_TABLET | Freq: Every evening | ORAL | Status: DC | PRN

## 2010-12-18 MED ORDER — FENTANYL CITRATE 0.05 MG/ML IJ SOLN: 25.0000 ug | INTRAMUSCULAR | Status: DC | PRN

## 2010-12-18 MED ORDER — HYDROMORPHONE HCL PF 1 MG/ML IJ SOLN
0.2000 mg | INTRAMUSCULAR | Status: DC | PRN
Start: 2010-12-18 — End: 2010-12-21
  Administered 2010-12-18: 0.3 mg via INTRAVENOUS
  Administered 2010-12-18: 0.6 mg via INTRAVENOUS
  Administered 2010-12-18: 0.3 mg via INTRAVENOUS
  Administered 2010-12-19: 0.6 mg via INTRAVENOUS
  Filled 2010-12-18 (×4): qty 1

## 2010-12-18 MED ORDER — POLYETHYLENE GLYCOL 3350 17 G PO PACK
17.0000 g | PACK | Freq: Every day | ORAL | Status: DC | PRN
  Administered 2010-12-20: 17 g via ORAL
  Filled 2010-12-18: qty 1

## 2010-12-18 MED ORDER — DEXAMETHASONE SODIUM PHOSPHATE 10 MG/ML IJ SOLN: 10.0000 mg | Freq: Once | INTRAMUSCULAR | Status: DC

## 2010-12-18 MED ORDER — SODIUM CHLORIDE 0.9 % IV SOLN
INTRAVENOUS | Status: DC
  Administered 2010-12-18 – 2010-12-19 (×2): via INTRAVENOUS
  Filled 2010-12-18 (×11): qty 1000

## 2010-12-18 MED ORDER — BUPIVACAINE IN DEXTROSE 0.75-8.25 % IT SOLN
INTRATHECAL | Status: DC | PRN
  Administered 2010-12-18: 2 mL via INTRATHECAL

## 2010-12-18 MED ORDER — RIVAROXABAN 10 MG PO TABS
10.0000 mg | ORAL_TABLET | Freq: Every day | ORAL | Status: DC
  Administered 2010-12-19 – 2010-12-21 (×3): 10 mg via ORAL
  Filled 2010-12-18 (×3): qty 1

## 2010-12-18 MED ORDER — MIDAZOLAM HCL 5 MG/5ML IJ SOLN
INTRAMUSCULAR | Status: DC | PRN
  Administered 2010-12-18: 1 mg via INTRAVENOUS

## 2010-12-18 SURGICAL SUPPLY — 57 items
ADH SKN CLS APL DERMABOND .7 (GAUZE/BANDAGES/DRESSINGS) ×1
BAG SPEC THK2 15X12 ZIP CLS (MISCELLANEOUS) ×1
BAG ZIPLOCK 12X15 (MISCELLANEOUS) ×2 IMPLANT
BANDAGE ELASTIC 6 VELCRO ST LF (GAUZE/BANDAGES/DRESSINGS) ×2 IMPLANT
BANDAGE ESMARK 6X9 LF (GAUZE/BANDAGES/DRESSINGS) ×1 IMPLANT
BLADE SAW SGTL 13.0X1.19X90.0M (BLADE) ×2 IMPLANT
BNDG CMPR 9X6 STRL LF SNTH (GAUZE/BANDAGES/DRESSINGS) ×1
BNDG ESMARK 6X9 LF (GAUZE/BANDAGES/DRESSINGS) ×2
BOWL SMART MIX CTS (DISPOSABLE) ×2 IMPLANT
CEMENT HV SMART SET (Cement) ×1 IMPLANT
CLOTH BEACON ORANGE TIMEOUT ST (SAFETY) ×2 IMPLANT
CUFF TOURN SGL QUICK 34 (TOURNIQUET CUFF) ×2
CUFF TRNQT CYL 34X4X40X1 (TOURNIQUET CUFF) ×1 IMPLANT
DECANTER SPIKE VIAL GLASS SM (MISCELLANEOUS) ×2 IMPLANT
DERMABOND ADVANCED (GAUZE/BANDAGES/DRESSINGS) ×1
DERMABOND ADVANCED .7 DNX12 (GAUZE/BANDAGES/DRESSINGS) ×1 IMPLANT
DRAPE EXTREMITY T 121X128X90 (DRAPE) ×2 IMPLANT
DRAPE POUCH INSTRU U-SHP 10X18 (DRAPES) ×2 IMPLANT
DRAPE U-SHAPE 47X51 STRL (DRAPES) ×2 IMPLANT
DRSG AQUACEL AG ADV 3.5X10 (GAUZE/BANDAGES/DRESSINGS) ×2 IMPLANT
DRSG TEGADERM 4X4.75 (GAUZE/BANDAGES/DRESSINGS) ×2 IMPLANT
DURAPREP 26ML APPLICATOR (WOUND CARE) ×2 IMPLANT
ELECT REM PT RETURN 9FT ADLT (ELECTROSURGICAL) ×2
ELECTRODE REM PT RTRN 9FT ADLT (ELECTROSURGICAL) ×1 IMPLANT
EVACUATOR 1/8 PVC DRAIN (DRAIN) ×2 IMPLANT
FACESHIELD LNG OPTICON STERILE (SAFETY) ×10 IMPLANT
GAUZE SPONGE 2X2 8PLY STRL LF (GAUZE/BANDAGES/DRESSINGS) ×1 IMPLANT
GLOVE BIOGEL PI IND STRL 7.5 (GLOVE) ×1 IMPLANT
GLOVE BIOGEL PI IND STRL 8 (GLOVE) ×1 IMPLANT
GLOVE BIOGEL PI INDICATOR 7.5 (GLOVE) ×1
GLOVE BIOGEL PI INDICATOR 8 (GLOVE) ×1
GLOVE ECLIPSE 8.0 STRL XLNG CF (GLOVE) ×2 IMPLANT
GLOVE ORTHO TXT STRL SZ7.5 (GLOVE) ×4 IMPLANT
GOWN STRL NON-REIN LRG LVL3 (GOWN DISPOSABLE) ×2 IMPLANT
HANDPIECE INTERPULSE COAX TIP (DISPOSABLE) ×2
IMMOBILIZER KNEE 20 (SOFTGOODS)
IMMOBILIZER KNEE 20 THIGH 36 (SOFTGOODS) IMPLANT
KIT BASIN OR (CUSTOM PROCEDURE TRAY) ×2 IMPLANT
MANIFOLD NEPTUNE II (INSTRUMENTS) ×2 IMPLANT
NDL SAFETY ECLIPSE 18X1.5 (NEEDLE) ×1 IMPLANT
NEEDLE HYPO 18GX1.5 SHARP (NEEDLE) ×2
NS IRRIG 1000ML POUR BTL (IV SOLUTION) ×4 IMPLANT
PACK TOTAL JOINT (CUSTOM PROCEDURE TRAY) ×2 IMPLANT
POSITIONER SURGICAL ARM (MISCELLANEOUS) ×2 IMPLANT
SET HNDPC FAN SPRY TIP SCT (DISPOSABLE) ×1 IMPLANT
SET PAD KNEE POSITIONER (MISCELLANEOUS) ×2 IMPLANT
SPONGE GAUZE 2X2 STER 10/PKG (GAUZE/BANDAGES/DRESSINGS) ×1
SUCTION FRAZIER 12FR DISP (SUCTIONS) ×2 IMPLANT
SUT MNCRL AB 4-0 PS2 18 (SUTURE) ×2 IMPLANT
SUT VIC AB 1 CT1 36 (SUTURE) ×6 IMPLANT
SUT VIC AB 2-0 CT1 27 (SUTURE) ×6
SUT VIC AB 2-0 CT1 TAPERPNT 27 (SUTURE) ×3 IMPLANT
SYR 50ML LL SCALE MARK (SYRINGE) ×2 IMPLANT
TOWEL OR 17X26 10 PK STRL BLUE (TOWEL DISPOSABLE) ×4 IMPLANT
TRAY FOLEY CATH 14FRSI W/METER (CATHETERS) ×2 IMPLANT
WATER STERILE IRR 1500ML POUR (IV SOLUTION) ×2 IMPLANT
WRAP KNEE MAXI GEL POST OP (GAUZE/BANDAGES/DRESSINGS) ×2 IMPLANT

## 2010-12-18 NOTE — Transfer of Care (Signed)
Immediate Anesthesia Transfer of Care Note  Patient: [name]  Procedure(s) Performed:  TOTAL KNEE ARTHROPLASTY  Patient Location: PACU  Anesthesia Type: Regional  Level of Consciousness: awake, alert  and oriented  Airway & Oxygen Therapy: Patient connected to face mask oxygen  Post-op Assessment: Report given to PACU RN  Post vital signs: Reviewed and stable  Complications: No apparent anesthesia complications

## 2010-12-18 NOTE — Progress Notes (Signed)
Utilization review completed.  

## 2010-12-18 NOTE — Interval H&P Note (Signed)
History and Physical Interval Note:  12/18/2010 10:17 AM  [name]  has presented today for surgery, with the diagnosis of left knee osteoarthrosis  The various methods of treatment have been discussed with the patient and family. After consideration of risks, benefits and other options for treatment, the patient has consented to  Procedure(s): LEFT TOTAL KNEE ARTHROPLASTY as a surgical intervention .  The patients' history has been reviewed, patient examined, no change in status, stable for surgery.  I have reviewed the patients' chart and labs.  Questions were answered to the patient's satisfaction.     Shelda Pal

## 2010-12-18 NOTE — Anesthesia Postprocedure Evaluation (Signed)
Anesthesia Post Note  Patient: [name]  Procedure(s) Performed:  TOTAL KNEE ARTHROPLASTY  Anesthesia type: Spinal  Patient location: PACU  Post pain: Pain level controlled  Post assessment: Post-op Vital signs reviewed  Last Vitals:  Filed Vitals:   12/18/10 1330  BP: 118/63  Pulse: 60  Temp:   Resp: 12    Post vital signs: Reviewed  Level of consciousness: sedated  Complications: No apparent anesthesia complications

## 2010-12-18 NOTE — Transfer of Care (Signed)
Immediate Anesthesia Transfer of Care Note  Patient: [name]  Procedure(s) Performed:  TOTAL KNEE ARTHROPLASTY  Patient Location: PACU  Anesthesia Type: Regional  Level of Consciousness: awake, alert  and oriented  Airway & Oxygen Therapy: Patient connected to face mask oxygen  Post-op Assessment: Report given to PACU RN  Post vital signs: Reviewed and stable  Complications: No apparent anesthesia complications 

## 2010-12-18 NOTE — Op Note (Signed)
NAME:  [name]                      MEDICAL RECORD NO.:  [id number]                             FACILITY:  [facility]      PHYSICIAN:  Madlyn Frankel. Charlann Boxer, M.D.  DATE OF BIRTH:  02-14-1931      DATE OF PROCEDURE:  12/18/2010                                     OPERATIVE REPORT         PREOPERATIVE DIAGNOSIS:  Left knee osteoarthritis.      POSTOPERATIVE DIAGNOSIS:  Left knee osteoarthritis.      FINDINGS:  The patient was noted to have complete loss of cartilage and   bone-on-bone arthritis with associated osteophytes in the medial and patello-femoral compartments of   the knee with a significant synovitis and associated effusion.      PROCEDURE:  Left total knee replacement.      COMPONENTS USED:  DePuy rotating platform posterior stabilized knee   system, a size 5 femur, 4 tibia, 10 mm insert, and 41 patellar   button.      SURGEON:  Madlyn Frankel. Charlann Boxer, M.D.      ASSISTANT:  Leilani Able, PA-C.      ANESTHESIA:  Spinal.      SPECIMENS:  None.      COMPLICATION:  None.      DRAINS:  One Hemovac.  EBL: 150cc      TOURNIQUET TIME:   Total Tourniquet Time Documented: Thigh (Left) - 36 minutes .      The patient was stable to the recovery room.      INDICATION FOR PROCEDURE:  [name] is a 77 y.o. male patient of   mine.  The patient had been seen, evaluated, and treated conservatively in the   office with medication, activity modification, and injections.  The patient had   radiographic changes of bone-on-bone arthritis with endplate sclerosis and osteophytes noted.      The patient failed conservative measures including medication, injections, and activity modification, and at this point was ready for more definitive measures.   Based on the radiographic changes and failed conservative measures, the patient   decided to proceed with total knee replacement.  Risks of infection,   DVT, component failure, need for revision surgery, postop course, and   expectations were all   discussed and reviewed.  Consent was obtained for benefit of pain   relief.      PROCEDURE IN DETAIL:  The patient was brought to the operative theater.   Once adequate anesthesia, preoperative antibiotics, 2 gm of Ancef administered, the patient was positioned supine with the left thigh tourniquet placed.  The  left lower extremity was prepped and draped in sterile fashion.  A time-   out was performed identifying the patient, planned procedure, and   extremity.      The left lower extremity was placed in the [facility] leg holder.  The leg was   exsanguinated, tourniquet elevated to 250 mmHg.  A midline incision was   made followed by median parapatellar arthrotomy.  Following initial   exposure, attention was first directed to the patella.  Precut  measurement was noted to be 25 mm.  I resected down to 15 mm and used a   41 patellar button to restore patellar height as well as cover the cut   surface.      The lug holes were drilled and a metal shim was placed to protect the   patella from retractors and saw blades.      At this point, attention was now directed to the femur.  The femoral   canal was opened with a drill, irrigated to try to prevent fat emboli.  An   intramedullary rod was passed at 3 degrees valgus, 11 mm of bone was   resected off the distal femur due to a pre-operative flexion contracture.  Following this resection, the tibia was   subluxated anteriorly.  Using the extramedullary guide, 10 mm of bone was resected off   the proximal lateral tibia.  We confirmed the gap would be   stable medially and laterally with a 10 mm insert as well as confirmed   the cut was perpendicular in the coronal plane, checking with an alignment rod.      Once this was done, I sized the femur to be a size 5 in the anterior-   posterior dimension, chose a 5 component based on medial and   lateral dimension.  The size 5 rotation block was then pinned in   position  anterior referenced using the C-clamp to set rotation.  The   anterior, posterior, and  chamfer cuts were made without difficulty nor   notching making certain that I was along the anterior cortex to help   with flexion gap stability.      The final box cut was made off the lateral aspect of distal femur.      At this point, the tibia was sized to be a size 4, the size 4 tray was   then pinned in position through the medial third of the tubercle,   drilled, and keel punched.  Trial reduction was now carried with a 5 femur,  4 tibia, a 10 mm insert, and the 41 patella botton.  The knee was brought to   extension, full extension with good flexion stability with the patella   tracking through the trochlea without application of pressure.  Given   all these findings, the trial components removed.  Final components were   opened and cement was mixed.  The knee was irrigated with normal saline   solution and pulse lavage.  The synovial lining was   then injected with 0.25% Marcaine with epinephrine and 1 cc of Toradol,   total of 61 cc.      The knee was irrigated.  Final implants were then cemented onto clean and   dried cut surfaces of bone with the knee brought to extension with a 10   mm trial insert.      Once the cement had fully cured, the excess cement was removed   throughout the knee.  I confirmed I was satisfied with the range of   motion and stability, and the final 10 mm insert was chosen.  It was   placed into the knee.      The tourniquet had been let down at 35 minutes.  No significant   hemostasis required.  The medium Hemovac drain was placed deep.  The   extensor mechanism was then reapproximated using #1 Vicryl with the knee   in flexion.  The   remaining  wound was closed with 2-0 Vicryl and running 4-0 Monocryl.   The knee was cleaned, dried, dressed sterilely using Dermabond and   Aquacel dressing.  Drain site dressed separately.  The patient was then   brought to  recovery room in stable condition, tolerating the procedure   well.   Please note that Physician Assistant, Leilani Able, was present for the entirety of the case, and was utilized for pre-operative positioning, peri-operative retractor management, general facilitation of the procedure.  He was also utilized for primary wound closure at the end of the case.              Madlyn Frankel Charlann Boxer, M.D.

## 2010-12-18 NOTE — Anesthesia Procedure Notes (Addendum)
Spinal  Patient location during procedure: OR Staffing Performed by: anesthesiologist  Preanesthetic Checklist Completed: patient identified, site marked, surgical consent, pre-op evaluation, timeout performed, IV checked, risks and benefits discussed and monitors and equipment checked Spinal Block Patient position: sitting Prep: Betadine Patient monitoring: heart rate, continuous pulse ox and blood pressure Injection technique: single-shot Needle Needle type: Quincke  Needle gauge: 22 G Needle length: 9 cm Additional Notes Expiration date of kit checked and confirmed. Patient tolerated procedure well, without complications.    Anesthesia Regional Block:   Narrative:

## 2010-12-18 NOTE — Anesthesia Preprocedure Evaluation (Signed)
Anesthesia Evaluation   Patient awake    Reviewed: Allergy & Precautions, H&P , NPO status , Patient's Chart, lab work & pertinent test results  Airway Mallampati: II TM Distance: >3 FB     Dental  (+) Teeth Intact and Dental Advisory Given   Pulmonary neg pulmonary ROS,    Pulmonary exam normal       Cardiovascular - CAD + pacemaker (History of atrial fibrillation treated with ablation and packemaker) Regular Normal    Neuro/Psych History of subdural hematoma several years ago requiring evacuation, Denies residual effects. Negative Neurological ROS  Negative Psych ROS   GI/Hepatic negative GI ROS, Neg liver ROS,   Endo/Other  Hypothyroidism   Renal/GU negative Renal ROS     Musculoskeletal negative musculoskeletal ROS (+)   Abdominal Normal abdominal exam  (+)   Peds  Hematology negative hematology ROS (+)   Anesthesia Other Findings   Reproductive/Obstetrics                           Anesthesia Physical Anesthesia Plan  ASA: III  Anesthesia Plan: Spinal   Post-op Pain Management:    Induction:   Airway Management Planned: Simple Face Mask  Additional Equipment:   Intra-op Plan:   Post-operative Plan:   Informed Consent: I have reviewed the patients History and Physical, chart, labs and discussed the procedure including the risks, benefits and alternatives for the proposed anesthesia with the patient or authorized representative who has indicated his/her understanding and acceptance.   Dental advisory given  Plan Discussed with: CRNA  Anesthesia Plan Comments:         Anesthesia Quick Evaluation

## 2010-12-18 NOTE — Anesthesia Postprocedure Evaluation (Signed)
Anesthesia Post Note  Patient: [name]  Procedure(s) Performed:  TOTAL KNEE ARTHROPLASTY  Anesthesia type: Spinal  Patient location: PACU  Post pain: Pain level controlled  Post assessment: Post-op Vital signs reviewed  Last Vitals:  Filed Vitals:   12/18/10 1400  BP: 126/73  Pulse: 60  Temp:   Resp: 13    Post vital signs: Reviewed  Level of consciousness: sedated  Complications: No apparent anesthesia complications

## 2010-12-19 DIAGNOSIS — Z96659 Presence of unspecified artificial knee joint: Secondary | ICD-10-CM

## 2010-12-19 LAB — CBC
Hemoglobin: 10 g/dL — ABNORMAL LOW (ref 13.0–17.0)
MCH: 31.2 pg (ref 26.0–34.0)
MCHC: 34.1 g/dL (ref 30.0–36.0)
Platelets: 149 10*3/uL — ABNORMAL LOW (ref 150–400)

## 2010-12-19 LAB — BASIC METABOLIC PANEL
Calcium: 8.2 mg/dL — ABNORMAL LOW (ref 8.4–10.5)
GFR calc non Af Amer: 68 mL/min — ABNORMAL LOW (ref >90)
Glucose, Bld: 116 mg/dL — ABNORMAL HIGH (ref 70–99)
Sodium: 134 mEq/L — ABNORMAL LOW (ref 135–145)

## 2010-12-19 MED ORDER — FERROUS SULFATE 325 (65 FE) MG PO TABS: 325.0000 mg | ORAL_TABLET | Freq: Three times a day (TID) | ORAL | Status: DC

## 2010-12-19 MED ORDER — ASPIRIN EC 325 MG PO TBEC: 325.0000 mg | DELAYED_RELEASE_TABLET | Freq: Two times a day (BID) | ORAL | Status: AC

## 2010-12-19 MED ORDER — DSS 100 MG PO CAPS: 100.0000 mg | ORAL_CAPSULE | Freq: Two times a day (BID) | ORAL | Status: AC

## 2010-12-19 MED ORDER — METHOCARBAMOL 500 MG PO TABS: 500.0000 mg | ORAL_TABLET | Freq: Four times a day (QID) | ORAL | Status: DC | PRN

## 2010-12-19 MED ORDER — CELECOXIB 200 MG PO CAPS: 200.0000 mg | ORAL_CAPSULE | Freq: Two times a day (BID) | ORAL | Status: AC

## 2010-12-19 MED ORDER — POLYETHYLENE GLYCOL 3350 17 G PO PACK: 17.0000 g | PACK | Freq: Two times a day (BID) | ORAL | Status: AC

## 2010-12-19 NOTE — Progress Notes (Signed)
CSW consulted for possible ST SNF placement. Me twith pt/spouse this am. Pt plans to return home with [facility] services upon d/c. RNCM to follow for D/C planning.

## 2010-12-19 NOTE — Progress Notes (Signed)
Subjective: 1 Day Post-Op Procedure(s) (LRB): TOTAL KNEE ARTHROPLASTY (Left)   Patient reports pain as moderate. No events other than some pain.  Objective:   VITALS:   Filed Vitals:   12/19/10 0600  BP: 107/67  Pulse: 64  Temp: 97.8 F (36.6 C)  Resp: 16    Neurovascular intact Dorsiflexion/Plantar flexion intact Incision: dressing C/D/I No cellulitis present Compartment soft  LABS  Basename 12/19/10 0359  HGB 10.0*  HCT 29.3*  WBC 7.5  PLT 149*     Basename 12/19/10 0359  NA 134*  K 4.7  BUN 23  CREATININE 1.03  GLUCOSE 116*    Assessment/Plan: 1 Day Post-Op Procedure(s) (LRB): TOTAL KNEE ARTHROPLASTY (Left)  Foley D/C'ed HV drain D/C'ed IV changed to NSL Advance diet Up with therapy Discharge home with home health on Thursday/Friday   Anastasio Auerbach. Delon Revelo   PAC  12/19/2010, 9:11 AM

## 2010-12-19 NOTE — Progress Notes (Signed)
Physical Therapy Evaluation Patient Details Name: [name] MRN: [id number] DOB: 07-20-1931 Today's Date: 12/19/2010 0921-1000 eval 2 1TA  Problem List:  Patient Active Problem List  Diagnoses  . AV BLOCK, COMPLETE  . ATRIAL FIBRILLATION-paroxysmal  . SICK SINUS/ TACHY-BRADY SYNDROME  . Geologist, engineering  . Orthostatic lightheadedness  . Mitral regurgitation    Past Medical History:  Past Medical History  Diagnosis Date  . Bradycardia     s/p PPM  . Atrial flutter     s/p ablation  . Atrial fibrillation   . Prostate cancer 12-21-2003    12-7-seed implants done   Past Surgical History:  Past Surgical History  Procedure Date  . Tonsillectomy as child  . Arthrscopic left knee surgery 02-11-2007  . Cardioversion 09-04-2006  . Av node ablation 04-06-2007  . Subdural hematoma evacuation via craniotomy 12-16-2007  . Rotator cuff repair w/ distal clavicle excision 08-31-2010    left  . Pacemaker insertion     [employer]     PT Assessment/Plan/Recommendation PT Assessment Clinical Impression Statement: pt is s/p Left TKA  and will benefit from PT to maximize independence PT Recommendation/Assessment: Patient will need skilled PT in the acute care venue PT Problem List: Decreased strength;Decreased range of motion;Decreased activity tolerance;Decreased balance;Decreased mobility;Decreased knowledge of use of DME;Pain Barriers to Discharge: None PT Therapy Diagnosis : Difficulty walking;Acute pain PT Plan PT Frequency: 7X/week PT Treatment/Interventions: DME instruction;Gait training;Stair training;Functional mobility training;Therapeutic activities;Therapeutic exercise;Patient/family education PT Recommendation Follow Up Recommendations: Home health PT NO DME recommended by PT  PT Goals  Acute Rehab PT Goals PT Goal Formulation: With patient Time For Goal Achievement: 5 days Pt will go Supine/Side to Sit: with supervision PT Goal:  Supine/Side to Sit - Progress: Progressing toward goal Pt will go Sit to Supine/Side: with supervision PT Goal: Sit to Supine/Side - Progress: Other (comment) Pt will go Sit to Stand: with supervision PT Goal: Sit to Stand - Progress: Progressing toward goal Pt will go Stand to Sit: with supervision PT Goal: Stand to Sit - Progress: Progressing toward goal Pt will Ambulate: 51 - 150 feet;with supervision;with rolling walker PT Goal: Ambulate - Progress: Progressing toward goal Pt will Go Up / Down Stairs: 1-2 stairs (has 2 short steps) PT Goal: Up/Down Stairs - Progress: Progressing toward goal Pt will Perform Home Exercise Program: with supervision, verbal cues required/provided PT Goal: Perform Home Exercise Program - Progress: Progressing toward goal  PT Evaluation Precautions/Restrictions  Precautions Precautions: Knee Precaution Comments: no pillow under knee Restrictions Weight Bearing Restrictions: No LLE Weight Bearing: Weight bearing as tolerated Prior Functioning  Home Living Lives With: Spouse Receives Help From: Family Type of Home:  (condo) Home Layout: One level Home Access: Stairs to enter [employer] of Steps: 2 separated short steps   Home Adaptive Equipment: Straight cane;Walker - rolling Additional Comments: toilet riser Prior Function Level of Independence: Independent with gait;Independent with transfers Cognition Cognition Arousal/Alertness: Awake/alert Overall Cognitive Status: Appears within functional limits for tasks assessed Orientation Level: Oriented X4 Sensation/Coordination   Extremity Assessment RLE Assessment RLE Assessment: Within Functional Limits (grossly) LLE Assessment LLE Assessment:  (independent SLR; ankle WFL) Mobility (including Balance) Bed Mobility Supine to Sit: 4: Min assist Supine to Sit Details (indicate cue type and reason): min assist with LLE; increased time; VCs to scoot Sitting - Scoot to Edge of Bed: 4:  Min assist Sitting - Scoot to Rex of Bed Details (indicate cue type and reason): assist to prevent posterior LOB; cues  for UE use Transfers Sit to Stand: 4: Min assist;From bed;3: Mod assist;From elevated surface Sit to Stand Details (indicate cue type and reason): cues for hands and LLE position Stand to Sit: 4: Min assist;To chair/3-in-1;3: Mod assist Stand to Sit Details: cues for hands and LLE position Ambulation/Gait Ambulation/Gait Assistance: 1: +2 Total assist;Patient percentage (comment) Ambulation/Gait Assistance Details (indicate cue type and reason): pt=60%; +2 lines, wt shift, safety, balance Ambulation Distance (Feet): 5 Feet Assistive device: Rolling walker Gait Pattern: Step-to pattern;Antalgic    Exercise  Total Joint Exercises Ankle Circles/Pumps: AROM;Both;Seated;10 reps Quad Sets: AROM;Both;10 reps;Seated End of Session PT - End of Session Activity Tolerance: Treatment limited secondary to medical complications (Comment) (decreased BP, dizziness and fatigue with activity) Patient left: in chair;with call bell in reach;with family/visitor present Nurse Communication: Other (comment) (BP) General Behavior During Session: [facility] for tasks performed Cognition: [facility] for tasks performed  [facility] 12/19/2010, 11:37 AM

## 2010-12-19 NOTE — Progress Notes (Signed)
Physical Therapy Treatment Patient Details Name: RILAN EILAND MRN: [id number] DOB: April 03, 1931 Today's Date: 12/19/2010 [id number] 1TE  PT Assessment/Plan  PT - Assessment/Plan Comments on Treatment Session: pt with less dizziness this pm; has a great deal of trouble relaxing hip flexors, contributing to pai in LLE, better knee and hip extension end of session/after ther ex PT Plan: Discharge plan remains appropriate;Frequency remains appropriate PT Frequency: 7X/week Follow Up Recommendations: Home health PT Equipment Recommended: No DME recommended by PT  PT Goals  Acute Rehab PT Goals PT Goal Formulation: With patient Time For Goal Achievement: 5 days Pt will go Supine/Side to Sit: with supervision PT Goal: Supine/Side to Sit - Progress: Progressing toward goal Pt will go Sit to Supine/Side: with supervision PT Goal: Sit to Supine/Side - Progress: Progressing toward goal Pt will go Sit to Stand: with supervision PT Goal: Sit to Stand - Progress: Progressing toward goal Pt will go Stand to Sit: with supervision PT Goal: Stand to Sit - Progress: Progressing toward goal Pt will Ambulate: 51 - 150 feet;with supervision;with rolling walker PT Goal: Ambulate - Progress: Progressing toward goal Pt will Go Up / Down Stairs: 1-2 stairs (has 2 short steps) PT Goal: Up/Down Stairs - Progress: Progressing toward goal Pt will Perform Home Exercise Program: with supervision, verbal cues required/provided PT Goal: Perform Home Exercise Program - Progress: Progressing toward goal  PT Treatment Precautions/Restrictions  Precautions Precautions: Knee Precaution Comments: no pillow under knee Restrictions Weight Bearing Restrictions: No LLE Weight Bearing: Weight bearing as tolerated Mobility (including Balance) Bed Mobility Sit to supine with min assist with LLE and cues for technique  Transfers Sit to Stand: 4: Min assist;From chair/3-in-1 Sit to Stand Details (indicate cue type and  reason): cues for hands, wt shift, LLE position Stand to Sit: 4: Min assist;To bed Stand to Sit Details: cues for hands and LLE position Ambulation/Gait Ambulation/Gait Assistance: 3: Mod assist Ambulation/Gait Assistance Details (indicate cue type and reason): stand-step pivot chair to bed with assist for balance, cues for  sequence Ambulation Distance (Feet): 5 Feet Assistive device: Rolling walker Gait Pattern: Step-to pattern;Antalgic    Exercise  Total Joint Exercises Ankle Circles/Pumps: AROM;Both;10 reps Quad Sets: AROM;Both;10 reps Short Arc Quad: AROM;Left;10 reps;Supine Heel Slides: AROM;10 reps;Supine;Left Hip ABduction/ADduction: AROM;Left;10 reps;Supine Straight Leg Raises: AROM;Left;5 reps;Supine End of Session PT - End of Session Activity Tolerance: Patient limited by fatigue Patient left: in bed;with call bell in reach Nurse Communication: Other (comment) (BP) General Behavior During Session: [facility] for tasks performed Cognition: [facility] for tasks performed  [facility] 12/19/2010, 12:59 PM

## 2010-12-19 NOTE — Discharge Summary (Signed)
Physician Discharge Summary  Patient ID: [name] MRN: [id number] DOB/AGE: 75-Jun-1933 75 y.o.  Admit date: 12/18/2010 Discharge date: 12/19/2010  Procedures:  Procedure(s) (LRB): TOTAL KNEE ARTHROPLASTY (Left)  Attending Physician: Shelda Pal   Admission Diagnoses: Left knee OA and pain    Discharge Diagnoses:  1. S/P Left total knee arthroplasty 2. Bradycardia  3. Atrial flutter  4. Atrial fibrillation  5. Prostate cancer   HPI: Pt is a 77 y.o. male complaining of left knee pain for 4-5 years. Pain had continually increased since the beginning. Initially the pt had a tear of the meniscus and had an arthroscopy in January 2008. X-rays in the clinic show end-stage arthritic changes of the left knee. Pt has tried various conservative treatments which have failed to alleviate their symptoms. Various options are discussed with the patient. Risks, benefits and expectations were discussed with the patient. Patient understand the risks, benefits and expectations and wishes to proceed with surgery.   PCP: Ginette Otto, MD, MD   Discharged Condition: good  Hospital Course:  Patient underwent the above stated procedure on 12/18/2010. Patient tolerated the procedure well and brought to the recovery room in good condition and subsequently to the floor.  POD #1 BP: 107/67 ; Pulse: 64 ; Temp: 97.8 F Pt's foley was removed, as well as the hemovac drain removed. IV was changed to a saline lock. Patient reports pain as moderate. No events other than some pain. Neurovascular intact, dorsiflexion/plantar flexion intact, incision: dressing C/D/I, no cellulitis present and compartment soft.  LABS  Basename  12/19/10 0359   HGB  10.0*   HCT  29.3*    POD #2  BP: 115/62 ; Pulse: 67 ; Temp: 98.9 F Patient reports pain as moderate. Complaining of some dizziness, but otherwise no events. Neurovascular intact, dorsiflexion/plantar flexion intact, incision: dressing C/D/I, no  cellulitis present and compartment soft.  LABS  Basename  12/20/10 0405    HGB  8.7*    HCT  24.9*     POD #3 BP: 122/68 ; Pulse: 64 ; Temp: 98.7 F  Patient reports pain as moderate. Some confusion last night but likely related to narcotics Patient and wife interested in him going to rehab. Neurovascular intact, dorsiflexion/plantar flexion intact, incision: dressing C/D/I, no cellulitis present and compartment soft.  LABS  No new labs  Discharge Exam: Extremities: Homans sign is negative, no sign of DVT, no edema, redness or tenderness in the calves or thighs and no ulcers, gangrene or trophic changes  Disposition: Nursing Facility  Discharge Orders    Future Appointments: Provider: Department: Dept Phone: Center:   01/24/2011 11:00 AM Duke Salvia, MD [facility] [phone] [id number]     Future Orders Please Complete By Expires   Diet - low sodium heart healthy      Call MD / Call 911      Comments:   If you experience chest pain or shortness of breath, CALL 911 and be transported to the hospital emergency room.  If you develope a fever above 101 F, pus (white drainage) or increased drainage or redness at the wound, or calf pain, call your surgeon's office.   Discharge instructions      Comments:   Maintain surgical dressing for 8 days, then replace with gauze and tape. Keep the area dry and clean until follow up. Follow up in 2 weeks at [facility]. Call with any questions or concerns.     Constipation Prevention  Comments:   Drink plenty of fluids.  Prune juice may be helpful.  You may use a stool softener, such as Colace (over the counter) 100 mg twice a day.  Use MiraLax (over the counter) for constipation as needed.   Increase activity slowly as tolerated      Weight Bearing as taught in Physical Therapy      Comments:   Use a walker or crutches as instructed.   Driving restrictions      Comments:   No driving for 4 weeks   TED hose       Comments:   Use stockings (TED hose) for 2 weeks on both leg(s).  You may remove them at night for sleeping.   Change dressing      Comments:   Maintain surgical dressing for 8 days, then change the dressing daily with sterile 4 x 4 inch gauze dressing and tape.      Current Discharge Medication List    START taking these medications   Details  aspirin EC 325 MG tablet Take 1 tablet (325 mg total) by mouth 2 (two) times daily. X 4 weeks Qty: 60 tablet, Refills: 0    celecoxib (CELEBREX) 200 MG capsule Take 1 capsule (200 mg total) by mouth 2 (two) times daily.    docusate sodium 100 MG CAPS Take 100 mg by mouth 2 (two) times daily.    ferrous sulfate 325 (65 FE) MG tablet Take 1 tablet (325 mg total) by mouth 3 (three) times daily after meals.    HYDROcodone-acetaminophen (NORCO) 7.5-325 MG per tablet Take 1-2 tablets by mouth every 4 (four) hours as needed. Qty: 120 tablet, Refills: 0    methocarbamol (ROBAXIN) 500 MG tablet Take 1 tablet (500 mg total) by mouth every 6 (six) hours as needed (muscle spasms). Qty: 50 tablet, Refills: 0    polyethylene glycol (MIRALAX / GLYCOLAX) packet Take 17 g by mouth 2 (two) times daily. Qty: 14 each      CONTINUE these medications which have NOT CHANGED   Details  flecainide (TAMBOCOR) 100 MG tablet TAKE 1 TABLET BY MOUTH 2 TIMES A DAY Qty: 60 tablet, Refills: 6    Glucosamine-Chondroit-Vit C-Mn CAPS Take by mouth 2 (two) times daily.      levothyroxine (SYNTHROID, LEVOTHROID) 125 MCG tablet Take 125 mcg by mouth every evening.     Psyllium (RA FIBER SUPPLEMENT PO) Take by mouth. Three tablets each day (dietary fiber 6g- soluble fiber 6g)     Multiple Vitamin (MULTIVITAMIN) tablet Take 1 tablet by mouth daily.      Omega-3 Fatty Acids (FISH OIL) 300 MG CAPS Take by mouth daily.      tadalafil (CIALIS) 10 MG tablet Take 10 mg by mouth daily as needed.        STOP taking these medications     traMADol (ULTRAM) 50 MG tablet  Comments:  Reason for Stopping:       aspirin 81 MG tablet Comments:  Reason for Stopping:          Signed: Anastasio Auerbach. Alyia Lacerte   PAC  12/21/2010, 7:20 AM

## 2010-12-20 LAB — BASIC METABOLIC PANEL
CO2: 26 mEq/L (ref 19–32)
Calcium: 8.4 mg/dL (ref 8.4–10.5)
GFR calc Af Amer: 74 mL/min — ABNORMAL LOW (ref >90)
GFR calc non Af Amer: 63 mL/min — ABNORMAL LOW (ref >90)
Sodium: 135 mEq/L (ref 135–145)

## 2010-12-20 LAB — CBC
MCH: 31.5 pg (ref 26.0–34.0)
Platelets: 136 10*3/uL — ABNORMAL LOW (ref 150–400)
RBC: 2.76 MIL/uL — ABNORMAL LOW (ref 4.22–5.81)

## 2010-12-20 NOTE — Progress Notes (Signed)
FL2 in shadow for MD signature. ST SNF may be needed upon D/C. SNF bed offers will be available in am , if needed. Will follow to assist with D/C planning.

## 2010-12-20 NOTE — Progress Notes (Signed)
Physical Therapy Treatment Patient Details Name: [name] MRN: [id number] DOB: Sep 16, 1931 Today's Date: 12/20/2010 1431 - 1510 2GT PT Assessment/Plan  PT - Assessment/Plan Comments on Treatment Session: pt continues with increased anxiety and requiring constant cues for safe task completion PT Plan: Discharge plan needs to be updated (Pt would benefit from ST-SNF level rehab) PT Frequency: 7X/week Follow Up Recommendations: Skilled nursing facility Equipment Recommended: Defer to next venue PT Goals  Acute Rehab PT Goals PT Goal: Sit to Supine/Side - Progress: Progressing toward goal PT Goal: Sit to Stand - Progress: Progressing toward goal PT Goal: Stand to Sit - Progress: Progressing toward goal  PT Treatment Precautions/Restrictions  Precautions Precautions: Knee Precaution Comments: no pillow under knee Required Braces or Orthoses: Yes Knee Immobilizer: Discontinue once straight leg raise with < 10 degree lag Restrictions Weight Bearing Restrictions: Yes LLE Weight Bearing: Weight bearing as tolerated Mobility (including Balance) Bed Mobility Bed Mobility: Yes Supine to Sit: 4: Min assist (for LLE) Supine to Sit Details (indicate cue type and reason): utilized sheet as leg lifter Sitting - Scoot to Edge of Bed: 4: Min assist Sit to Supine - Right: 4: Min assist;3: Mod assist Transfers Sit to Stand: 4: Min assist;With upper extremity assist;With armrests;From bed;From chair/3-in-1 Sit to Stand Details (indicate cue type and reason): cues for LE position, hand placement, posture Stand to Sit: 4: Min assist Stand to Sit Details: cues for LE position, hand placement, posture Ambulation/Gait Ambulation/Gait Assistance: 4: Min assist;3: Mod assist Ambulation/Gait Assistance Details (indicate cue type and reason): cues for posture, position from RW, sequence and increased UE WB Ambulation Distance (Feet):  (14, 29', 16) Assistive device: Rolling walker Gait Pattern:  Step-to pattern    Exercise    End of Session PT - End of Session Equipment Utilized During Treatment: Left knee immobilizer Activity Tolerance: Patient limited by pain Patient left: in bed;with call bell in reach General Behavior During Session: [facility] for tasks performed Cognition: [facility] for tasks performed  [name] 12/20/2010, 3:53 PM

## 2010-12-20 NOTE — Progress Notes (Signed)
Physical Therapy Treatment Patient Details Name: [name] MRN: [id number] DOB: [birth year] Today's Date: 12/20/2010 7829-5621 1 gt 1te PT Assessment/Plan  PT - Assessment/Plan Comments on Treatment Session: pt doing better, lesss dizziness today, still limited by pain and fatigue; pt anxious reagarding mobility PT Plan: Discharge plan remains appropriate;Frequency remains appropriate PT Frequency: 7X/week Follow Up Recommendations: Home health PT Equipment Recommended: None recommended by PT PT Goals  Acute Rehab PT Goals PT Goal: Supine/Side to Sit - Progress: Progressing toward goal PT Goal: Sit to Supine/Side - Progress: Progressing toward goal PT Goal: Sit to Stand - Progress: Progressing toward goal PT Goal: Stand to Sit - Progress: Progressing toward goal PT Goal: Ambulate - Progress: Progressing toward goal PT Goal: Perform Home Exercise Program - Progress: Progressing toward goal  PT Treatment Precautions/Restrictions  Precautions Precautions: Knee Precaution Comments: no pillow under knee Restrictions Weight Bearing Restrictions: Yes LLE Weight Bearing: Weight bearing as tolerated Mobility (including Balance) Bed Mobility Supine to Sit: 4: Min assist Supine to Sit Details (indicate cue type and reason): min assist with LLE; increased time; VCs to scoot Sitting - Scoot to Edge of Bed: 4: Min assist Sitting - Scoot to [place] of Bed Details (indicate cue type and reason): assist to prevent posterior LOB; cues for UE use Transfers Sit to Stand: 4: Min assist;From bed;With upper extremity assist Sit to Stand Details (indicate cue type and reason): cues for hands, wt shift, LLE position Stand to Sit: 4: Min assist;With upper extremity assist;With armrests;To chair/3-in-1 Stand to Sit Details: cues for hands and LLE position Ambulation/Gait Ambulation/Gait Assistance: 4: Min assist;3: Mod assist Ambulation/Gait Assistance Details (indicate cue type and reason): cues  for sequence, RW placement,  LOB x 3 to with min to mod assist to recover Ambulation Distance (Feet): 15 Feet Assistive device: Rolling walker Gait Pattern: Step-to pattern;Antalgic Stairs:  (stairs in am; pt has 2 very short separated steps)    Exercise  Total Joint Exercises Ankle Circles/Pumps: AROM;Both;10 reps Quad Sets: AROM;Both;10 reps Short Arc Quad: AROM;Left;10 reps; Heel Slides: AAROM;10 reps;;Left Straight Leg Raises: AAROM;Left;10 reps;Seated End of Session PT - End of Session Equipment Utilized During Treatment: Left knee immobilizer Activity Tolerance: Patient limited by fatigue Patient left: in chair;with call bell in reach General Behavior During Session: [facility] for tasks performed Cognition: [facility] for tasks performed  [facility] 12/20/2010, 10:18 AM

## 2010-12-20 NOTE — Progress Notes (Signed)
Physical Therapy Treatment Patient Details Name: [name] MRN: [id number] DOB: [birth year] Today's Date: 12/20/2010 1205 - 1215  TA PT Assessment/Plan  PT - Assessment/Plan Comments on Treatment Session: pt continues with increased anxiety and requiring constant cues for safe task completion PT Plan: Discharge plan remains appropriate PT Frequency: 7X/week Follow Up Recommendations: Home health PT Equipment Recommended: None recommended by PT PT Goals  Acute Rehab PT Goals PT Goal: Supine/Side to Sit - Progress: Progressing toward goal PT Goal: Sit to Supine/Side - Progress: Progressing toward goal PT Goal: Sit to Stand - Progress: Progressing toward goal PT Goal: Stand to Sit - Progress: Progressing toward goal PT Goal: Ambulate - Progress: Progressing toward goal PT Goal: Perform Home Exercise Program - Progress: Progressing toward goal  PT Treatment Precautions/Restrictions  Precautions Precautions: Knee Precaution Comments: no pillow under knee Required Braces or Orthoses: Yes Knee Immobilizer: Discontinue once straight leg raise with < 10 degree lag Restrictions Weight Bearing Restrictions: Yes LLE Weight Bearing: Weight bearing as tolerated Mobility (including Balance) Bed Mobility Supine to Sit: 4: Min assist Supine to Sit Details (indicate cue type and reason): min assist with LLE; increased time; VCs to scoot Sitting - Scoot to Edge of Bed: 4: Min assist Sitting - Scoot to [place] of Bed Details (indicate cue type and reason): assist to prevent posterior LOB; cues for UE use Sit to Supine - Right: 4: Min assist;3: Mod assist Transfers Sit to Stand: 4: Min assist;3: Mod assist;With upper extremity assist;With armrests;From chair/3-in-1 Sit to Stand Details (indicate cue type and reason): cues for LE position and use of UEs Stand to Sit: 4: Min assist;3: Mod assist;To bed;Without upper extremity assist Stand to Sit Details: cues for LE position and use of  UEs Ambulation/Gait Ambulation/Gait Assistance: 4: Min assist;3: Mod assist Ambulation/Gait Assistance Details (indicate cue type and reason): cues for posture, position from RW, sequence and increased UE WB Ambulation Distance (Feet): 5 Feet Assistive device: Rolling walker Gait Pattern: Step-to pattern Stairs:  (stairs in am; pt has 2 very short separated steps)     End of Session PT - End of Session Equipment Utilized During Treatment: Left knee immobilizer Activity Tolerance: Patient limited by pain Patient left: in bed;with call bell in reach General Behavior During Session: [facility] for tasks performed Cognition: [facility] for tasks performed  [name] 12/20/2010, 12:23 PM

## 2010-12-20 NOTE — Progress Notes (Signed)
Subjective: 2 Days Post-Op Procedure(s) (LRB): TOTAL KNEE ARTHROPLASTY (Left)   Patient reports pain as moderate. Complaining of some dizziness, but otherwise no events.   Objective:   VITALS:   Filed Vitals:   12/20/10 0540  BP: 115/62  Pulse: 67  Temp: 98.9 F (37.2 C)  Resp: 18    Neurovascular intact Dorsiflexion/Plantar flexion intact Incision: dressing C/D/I No cellulitis present Compartment soft  LABS  Basename 12/20/10 0405 12/19/10 0359  HGB 8.7* 10.0*  HCT 24.9* 29.3*  WBC 6.3 7.5  PLT 136* 149*     Basename 12/20/10 0405 12/19/10 0359  NA 135 134*  K 4.1 4.7  BUN 15 23  CREATININE 1.09 1.03  GLUCOSE 105* 116*   Assessment/Plan: 2 Days Post-Op Procedure(s) (LRB): TOTAL KNEE ARTHROPLASTY (Left)   Up with therapy Plan for discharge tomorrow to home with HHPT   [name]. [name]   PAC  12/20/2010, 7:53 AM

## 2010-12-20 NOTE — Progress Notes (Signed)
Occupational Therapy Evaluation Patient Details Name: TAISON CELANI MRN: [id number] DOB: 1931-05-21 Today's Date: 12/20/2010 EV2 [id number] Problem List:  Patient Active Problem List  Diagnoses  . AV BLOCK, COMPLETE  . ATRIAL FIBRILLATION-paroxysmal  . SICK SINUS/ TACHY-BRADY SYNDROME  . Geologist, engineering  . Orthostatic lightheadedness  . Mitral regurgitation  . S/P left total knee replacement    Past Medical History:  Past Medical History  Diagnosis Date  . Bradycardia     s/p PPM  . Atrial flutter     s/p ablation  . Atrial fibrillation   . Prostate cancer 12-21-2003    12-7-seed implants done   Past Surgical History:  Past Surgical History  Procedure Date  . Tonsillectomy as child  . Arthrscopic left knee surgery 02-11-2007  . Cardioversion 09-04-2006  . Av node ablation 04-06-2007  . Subdural hematoma evacuation via craniotomy 12-16-2007  . Rotator cuff repair w/ distal clavicle excision 08-31-2010    left  . Pacemaker insertion     [employer]     OT Assessment/Plan/Recommendation OT Assessment Clinical Impression Statement: Skilled OT to maximize I w/ADLs to supervision for d/c to next venue of care OT Recommendation/Assessment: Patient will need skilled OT in the acute care venue OT Problem List: Decreased activity tolerance;Decreased safety awareness;Decreased knowledge of use of DME or AE Problem List Comments: Wife states she cannot manage pt at home Barriers to Discharge: Decreased caregiver support OT Therapy Diagnosis : Generalized weakness OT Plan OT Frequency: Min 1X/week OT Treatment/Interventions: Self-care/ADL training;DME and/or AE instruction;Therapeutic activities;Patient/family education OT Recommendation Equipment Recommended: Defer to next venue Individuals Consulted Consulted and Agree with Results and Recommendations: Patient;Family member/caregiver OT Goals Acute Rehab OT Goals OT Goal Formulation: With  patient/family Time For Goal Achievement: 2 weeks ADL Goals Pt Will Perform Lower Body Bathing: with supervision;Sit to stand from bed ADL Goal: Lower Body Bathing - Progress: Progressing toward goals Pt Will Perform Lower Body Dressing: with supervision;Sit to stand from bed;with adaptive equipment ADL Goal: Lower Body Dressing - Progress: Progressing toward goals Pt Will Transfer to Toilet: with supervision;3-in-1;Raised toilet seat with arms ADL Goal: Toilet Transfer - Progress: Progressing toward goals Pt Will Perform Toileting - Clothing Manipulation: with supervision;Standing ADL Goal: Toileting - Clothing Manipulation - Progress: Progressing toward goals Pt Will Perform Toileting - Hygiene: with supervision;Sit to stand from 3-in-1/toilet ADL Goal: Toileting - Hygiene - Progress: Progressing toward goals  OT Evaluation Precautions/Restrictions  Precautions Precautions: Knee Precaution Comments: no pillow under knee Required Braces or Orthoses: Yes Knee Immobilizer: Discontinue once straight leg raise with < 10 degree lag Restrictions Weight Bearing Restrictions: Yes LLE Weight Bearing: Weight bearing as tolerated Prior Functioning Home Living Bathroom Shower/Tub: Walk-in shower Bathroom Toilet: Handicapped height Bathroom Accessibility: Yes How Accessible: Accessible via walker Home Adaptive Equipment: Raised toilet seat with rails;Straight cane;Walker - rolling;Shower chair without back   ADL ADL Grooming: Performed;Wash/dry face;Other (comment) (Minguard A for posture, balance) Where Assessed - Grooming: Standing at sink Upper Body Bathing: Simulated;Set up Where Assessed - Upper Body Bathing: Sitting, bed;Unsupported Lower Body Bathing: Simulated;Moderate assistance Where Assessed - Lower Body Bathing: Sit to stand from bed Upper Body Dressing: Simulated;Set up Where Assessed - Upper Body Dressing: Sitting, bed;Unsupported Lower Body Dressing: Simulated;Moderate  assistance Where Assessed - Lower Body Dressing: Sit to stand from bed Toilet Transfer: Performed;Minimal assistance;Moderate assistance Toilet Transfer Details (indicate cue type and reason): cues for safe technique manipulating RW around bathroom, hand placement, LE position Toilet Transfer Method: Freight forwarder  Equipment: Raised toilet seat with arms (or 3-in-1 over toilet) Toileting - Clothing Manipulation: Performed Where Assessed - Toileting Clothing Manipulation: Sit to stand from 3-in-1 or toilet Toileting - Hygiene: Performed;Other (comment) (minguard A) Where Assessed - Toileting Hygiene: Sit to stand from 3-in-1 or toilet Tub/Shower Transfer: Not assessed Tub/Shower Transfer Method: Not assessed Equipment Used: Rolling walker (3:1) Vision/Perception    Cognition Cognition Arousal/Alertness: Awake/alert Overall Cognitive Status: Appears within functional limits for tasks assessed Orientation Level: Oriented X4 Sensation/Coordination   Extremity Assessment RUE Assessment RUE Assessment: Within Functional Limits LUE Assessment LUE Assessment: Within Functional Limits Mobility  Bed Mobility Bed Mobility: Yes Supine to Sit: 4: Min assist (for LLE) Supine to Sit Details (indicate cue type and reason): utilized sheet as leg lifter Sitting - Scoot to Edge of Bed: 4: Min assist Sit to Supine - Right: 4: Min assist;3: Mod assist Transfers Sit to Stand: 4: Min assist;With upper extremity assist;With armrests;From bed;From chair/3-in-1 Sit to Stand Details (indicate cue type and reason): cues for LE position, hand placement, posture Stand to Sit: 4: Min assist Stand to Sit Details: cues for LE position, hand placement, posture Exercises   End of Session OT - End of Session Equipment Utilized During Treatment: Gait belt (RW, 3:1) Activity Tolerance: Patient tolerated treatment well Patient left: in chair;with call bell in reach;with family/visitor  present General Behavior During Session: [facility] for tasks performed Cognition: [facility] for tasks performed   Chenelle Benning A, OTR/L [phone] 12/20/2010, 3:09 PM

## 2010-12-21 LAB — BASIC METABOLIC PANEL
BUN: 14 mg/dL (ref 6–23)
Calcium: 8.5 mg/dL (ref 8.4–10.5)
GFR calc non Af Amer: 70 mL/min — ABNORMAL LOW (ref >90)
Glucose, Bld: 118 mg/dL — ABNORMAL HIGH (ref 70–99)

## 2010-12-21 MED ORDER — METHOCARBAMOL 500 MG PO TABS: 500.0000 mg | ORAL_TABLET | Freq: Four times a day (QID) | ORAL | Status: AC | PRN

## 2010-12-21 MED ORDER — HYDROCODONE-ACETAMINOPHEN 7.5-325 MG PO TABS: 1.0000 | ORAL_TABLET | ORAL | Status: AC | PRN

## 2010-12-21 NOTE — Progress Notes (Signed)
Utilization review completed.  

## 2010-12-21 NOTE — Progress Notes (Signed)
CM spoke with patient 12/20/10,  wants to go home where spouse will be caregiver . Already has DME that he has borrowed-RW, elevated toilet seat. Wishes to use [facility] for hh pt 12/21/2010 Raynelle Bring [facility] CCM [phone] Plans have changed to SNF rehab today per patient and spouse. CSW referral for snf rehab

## 2010-12-21 NOTE — Progress Notes (Signed)
Pt planning to D/C to [facility] place today for ST  SNF placement. [employer] has provided auth for SNF and ambulance transport ( [facility] ).

## 2010-12-21 NOTE — Progress Notes (Signed)
Physical Therapy Treatment Patient Details Name: [name] MRN: [id number] DOB: 1931-09-03 Today's Date: 12/21/2010 0940-1010 1gt 1te PT Assessment/Plan  PT - Assessment/Plan Comments on Treatment Session: pt with less anxiety this am regarding mobility, able to demo good carryover with safe mobility techniques and self correct toward end of session PT Plan: Discharge plan remains appropriate;Frequency remains appropriate PT Frequency: 7X/week Follow Up Recommendations: Skilled nursing facility  vs. Home health PT (if  does not D/C SNF today will likely progress to [facility] Monday) Equipment Recommended: None recommended by PT PT Goals  Acute Rehab PT Goals Time For Goal Achievement: 7 days PT Goal: Supine/Side to Sit - Progress: Progressing toward goal PT Goal: Sit to Stand - Progress: Progressing toward goal PT Goal: Stand to Sit - Progress: Progressing toward goal PT Goal: Ambulate - Progress: Progressing toward goal PT Goal: Perform Home Exercise Program - Progress: Progressing toward goal  PT Treatment Precautions/Restrictions  Precautions Precautions: Knee Precaution Comments: no pillow under knee Required Braces or Orthoses: Yes Knee Immobilizer: Discontinue once straight leg raise with < 10 degree lag Restrictions Weight Bearing Restrictions: Yes LLE Weight Bearing: Weight bearing as tolerated Mobility (including Balance) Bed Mobility Supine to Sit: 4: Min assist Supine to Sit Details (indicate cue type and reason): min assist with LLE; VCs to scoot Sitting - Scoot to Edge of Bed: 5: Supervision Transfers Sit to Stand: From bed;With upper extremity assist (min/guard) Sit to Stand Details (indicate cue type and reason): cues for LE position and use of UEs Stand to Sit:  (min/guard) Stand to Sit Details: cues for hand placement, wt shift and bil LE position Ambulation/Gait Ambulation/Gait Assistance: 4: Min assist Ambulation/Gait Assistance Details (indicate cue  type and reason): much improved gait stability today, no LOB; cues for  sequence, posture and RW distance from self Ambulation Distance (Feet): 100 Feet Assistive device: Rolling walker Gait Pattern: Step-to pattern    Exercise  Total Joint Exercises Ankle Circles/Pumps: AROM;Both;10 reps Quad Sets: AROM;Both;10 reps Short Arc Quad: AROM;Left;10 reps;Supine Heel Slides: AAROM;10 reps;Supine;Left Straight Leg Raises: 10 reps;AAROM;Left;Supine End of Session PT - End of Session Equipment Utilized During Treatment: Left knee immobilizer Activity Tolerance: Patient tolerated treatment well Patient left: in chair;with call bell in reach General Behavior During Session: [facility] for tasks performed Cognition: [facility] for tasks performed  [facility] 12/21/2010, 10:29 AM

## 2010-12-21 NOTE — Progress Notes (Signed)
Patient discharged to SNF for short term rehab at [facility], VSS, no complaints at present time, wife at bedside, 1 bag belongings sent with patient via ambulance.

## 2010-12-21 NOTE — Progress Notes (Signed)
Subjective: 3 Days Post-Op Procedure(s) (LRB): TOTAL KNEE ARTHROPLASTY (Left)    Patient reports pain as moderate. Some confusion last night but likely related to narcotics  Patient and wife interested in him going to rehab  Objective:   VITALS:   Filed Vitals:   12/21/10 0455  BP: 122/68  Pulse: 64  Temp: 98.7 F (37.1 C)  Resp: 16    Neurovascular intact Incision: dressing C/D/I Compartment soft  LABS  Basename 12/20/10 0405 12/19/10 0359  HGB 8.7* 10.0*  HCT 24.9* 29.3*  WBC 6.3 7.5  PLT 136* 149*     Basename 12/21/10 0421 12/20/10 0405 12/19/10 0359  NA 134* 135 134*  K 3.8 4.1 4.7  BUN 14 15 23   CREATININE 1.01 1.09 1.03  GLUCOSE 118* 105* 116*    No results found for this basename: LABPT:2,INR:2 in the last 72 hours   Assessment/Plan: 3 Days Post-Op Procedure(s) (LRB): TOTAL KNEE ARTHROPLASTY (Left)   Discharge to SNF Will work to get him to SNF today after therapy

## 2010-12-21 NOTE — Progress Notes (Signed)
Physical Therapy Treatment Patient Details Name: [name] MRN: [id number] DOB: Aug 14, 1931 Today's Date: 12/21/2010 12-28-1245 2gt PT Assessment/Plan  PT - Assessment/Plan Comments on Treatment Session: pt doing better, feeling better, possible d/c to SNF PT Plan: Discharge plan remains appropriate;Frequency remains appropriate PT Frequency: 7X/week Follow Up Recommendations: Skilled nursing facility;Home health PT Equipment Recommended: None recommended by PT PT Goals  Acute Rehab PT Goals Time For Goal Achievement: 7 days PT Goal: Supine/Side to Sit - Progress: Progressing toward goal PT Goal: Sit to Stand - Progress: Progressing toward goal PT Goal: Stand to Sit - Progress: Progressing toward goal PT Goal: Ambulate - Progress: Progressing toward goal PT Goal: Perform Home Exercise Program - Progress: Progressing toward goal  PT Treatment Precautions/Restrictions  Precautions Precautions: Knee Precaution Comments: no pillow under knee Required Braces or Orthoses: Yes Knee Immobilizer: Discontinue once straight leg raise with < 10 degree lag Restrictions Weight Bearing Restrictions: Yes LLE Weight Bearing: Weight bearing as tolerated Mobility (including Balance) Bed Mobility Supine to Sit: 4: Min assist Supine to Sit Details (indicate cue type and reason): min assist with LLE; VCs to scoot Sitting - Scoot to Edge of Bed: 5: Supervision Transfers Sit to Stand: 5: Supervision;From chair/3-in-1 Sit to Stand Details (indicate cue type and reason): cues for LE position and use of UEs Stand to Sit: 5: Supervision;To chair/3-in-1 Stand to Sit Details: cues for hand placement, wt shift and bil LE position Ambulation/Gait Ambulation/Gait Assistance: 5: Supervision Ambulation/Gait Assistance Details (indicate cue type and reason): cues sequence Ambulation Distance (Feet): 140 Feet Assistive device: Rolling walker Gait Pattern: Step-to pattern    End of Session PT - End  of Session Equipment Utilized During Treatment: Left knee immobilizer Activity Tolerance: Patient tolerated treatment well Patient left: in chair;with call bell in reach General Behavior During Session: [facility] for tasks performed Cognition: [facility] for tasks performed  [facility] 12/21/2010, 1:29 PM

## 2010-12-25 ENCOUNTER — Encounter (HOSPITAL_COMMUNITY): Payer: Self-pay | Admitting: Orthopedic Surgery

## 2010-12-25 ENCOUNTER — Encounter: Payer: Medicare Other | Admitting: Internal Medicine

## 2010-12-30 ENCOUNTER — Encounter: Payer: Self-pay | Admitting: Internal Medicine

## 2010-12-30 DIAGNOSIS — I498 Other specified cardiac arrhythmias: Secondary | ICD-10-CM

## 2011-01-22 ENCOUNTER — Ambulatory Visit: Payer: Medicare Other | Attending: Orthopedic Surgery | Admitting: Rehabilitation

## 2011-01-22 DIAGNOSIS — IMO0001 Reserved for inherently not codable concepts without codable children: Secondary | ICD-10-CM | POA: Insufficient documentation

## 2011-01-22 DIAGNOSIS — M25669 Stiffness of unspecified knee, not elsewhere classified: Secondary | ICD-10-CM | POA: Insufficient documentation

## 2011-01-22 DIAGNOSIS — M25569 Pain in unspecified knee: Secondary | ICD-10-CM | POA: Insufficient documentation

## 2011-01-22 DIAGNOSIS — R262 Difficulty in walking, not elsewhere classified: Secondary | ICD-10-CM | POA: Insufficient documentation

## 2011-01-23 ENCOUNTER — Ambulatory Visit: Payer: Medicare Other | Admitting: Rehabilitation

## 2011-01-24 ENCOUNTER — Ambulatory Visit: Payer: Medicare Other | Admitting: Rehabilitation

## 2011-01-24 ENCOUNTER — Encounter: Payer: Self-pay | Admitting: Internal Medicine

## 2011-01-24 ENCOUNTER — Ambulatory Visit (INDEPENDENT_AMBULATORY_CARE_PROVIDER_SITE_OTHER): Payer: Medicare Other | Admitting: Internal Medicine

## 2011-01-24 DIAGNOSIS — I951 Orthostatic hypotension: Secondary | ICD-10-CM

## 2011-01-24 DIAGNOSIS — I495 Sick sinus syndrome: Secondary | ICD-10-CM

## 2011-01-24 DIAGNOSIS — R42 Dizziness and giddiness: Secondary | ICD-10-CM

## 2011-01-24 DIAGNOSIS — I4891 Unspecified atrial fibrillation: Secondary | ICD-10-CM

## 2011-01-24 DIAGNOSIS — I442 Atrioventricular block, complete: Secondary | ICD-10-CM

## 2011-01-24 DIAGNOSIS — Z950 Presence of cardiac pacemaker: Secondary | ICD-10-CM

## 2011-01-24 LAB — PACEMAKER DEVICE OBSERVATION
AL AMPLITUDE: 2.9 mv
AL IMPEDENCE PM: 460 Ohm
AL THRESHOLD: 0.6 V
VENTRICULAR PACING PM: 95

## 2011-01-24 NOTE — Assessment & Plan Note (Signed)
Currently holding sinus rhythm on flecainide. We discussed again oral anticoagulation. He would like to wait until the release of apixoban.

## 2011-01-24 NOTE — Progress Notes (Signed)
  HPI  [name] is a 78 y.o. male seen in followup for pacemaker implanted for tachybradycardia syndrome. He is status post flutter ablation. He has some atrial fibrillation for which he takes flecainide. He had no symptoms of palpitations.  He has no complaints of chest pain or shortness of breath    He is recently undergoing knee replacement surgery and he is doing well following that.  Past Medical History  Diagnosis Date  . Bradycardia     s/p PPM  . Atrial flutter     s/p ablation  . Atrial fibrillation   . Prostate cancer 12-21-2003    12-7-seed implants done  . Hematoma   . History of difficult shoulder delivery, resulting in fractured clavicle of infant, currently pregnant     Past Surgical History  Procedure Date  . Tonsillectomy as child  . Arthrscopic left knee surgery 02-11-2007  . Cardioversion 09-04-2006  . Av node ablation 04-06-2007  . Subdural hematoma evacuation via craniotomy 12-16-2007  . Rotator cuff repair w/ distal clavicle excision 08-31-2010    left  . Pacemaker insertion     [employer]   . Total knee arthroplasty 12/18/2010    Procedure: TOTAL KNEE ARTHROPLASTY;  Surgeon: Shelda Pal;  Location: [facility]S;  Service: Orthopedics;  Laterality: Left;    Current Outpatient Prescriptions  Medication Sig Dispense Refill  . aspirin 81 MG tablet Take 160 mg by mouth daily.      . celecoxib (CELEBREX) 200 MG capsule Take 200 mg by mouth daily.      . flecainide (TAMBOCOR) 100 MG tablet TAKE 1 TABLET BY MOUTH 2 TIMES A DAY  60 tablet  6  . Glucosamine-Chondroit-Vit C-Mn CAPS Take by mouth 2 (two) times daily.        Marland Kitchen HYDROcodone-acetaminophen (NORCO) 7.5-325 MG per tablet Take 1 tablet by mouth every 6 (six) hours as needed. Take 1 or 2 tablets as needed      . levothyroxine (SYNTHROID, LEVOTHROID) 150 MCG tablet Take 150 mcg by mouth daily.      . Methylfol-Methylcob-Acetylcyst (CEREFOLIN NAC) 6-2-600 MG TABS Take 1 tablet by mouth daily.      .  Multiple Vitamin (MULTIVITAMIN) tablet Take 1 tablet by mouth daily.        . Omega-3 Fatty Acids (FISH OIL) 300 MG CAPS Take by mouth daily.        . Psyllium (RA FIBER SUPPLEMENT PO) Take by mouth. Three tablets each day (dietary fiber 6g- soluble fiber 6g)         No Known Allergies  Review of Systems negative except from HPI and PMH  Physical Exam BP 122/70  Pulse 61  Ht 6' (1.829 m)  Wt 170 lb 6.4 oz (77.293 kg)  BMI 23.11 kg/m2 Well developed and well nourished in no acute distress HENT normal E scleral and icterus clear Neck Supple JVP flat; carotids brisk and full Clear to ausculation Regular rate and rhythm, no murmurs gallops or rub Soft with active bowel sounds No clubbing cyanosis none Edema walking with a cane Alert and oriented, grossly normal motor and sensory function Skin Warm and Dry   Assessment and  Plan

## 2011-01-24 NOTE — Assessment & Plan Note (Signed)
As above.

## 2011-01-24 NOTE — Assessment & Plan Note (Signed)
Stable post pacing 

## 2011-01-24 NOTE — Assessment & Plan Note (Signed)
The patient's device was interrogated.  The information was reviewed. No changes were made in the programming.    

## 2011-01-24 NOTE — Assessment & Plan Note (Signed)
Much improved with isometric maneuver

## 2011-01-28 ENCOUNTER — Ambulatory Visit: Payer: Medicare Other | Admitting: Rehabilitation

## 2011-01-30 ENCOUNTER — Ambulatory Visit: Payer: Medicare Other | Admitting: Rehabilitation

## 2011-02-01 ENCOUNTER — Encounter: Payer: Medicare Other | Admitting: Rehabilitation

## 2011-02-05 ENCOUNTER — Ambulatory Visit: Payer: Medicare Other | Admitting: Rehabilitation

## 2011-02-06 ENCOUNTER — Encounter: Payer: Medicare Other | Admitting: Rehabilitation

## 2011-02-07 ENCOUNTER — Encounter: Payer: Medicare Other | Admitting: Rehabilitation

## 2011-02-11 ENCOUNTER — Ambulatory Visit: Payer: Medicare Other | Admitting: Rehabilitation

## 2011-02-13 ENCOUNTER — Ambulatory Visit: Payer: Medicare Other | Admitting: Rehabilitation

## 2011-02-15 ENCOUNTER — Ambulatory Visit: Payer: Medicare Other | Attending: Geriatric Medicine | Admitting: Rehabilitation

## 2011-02-15 DIAGNOSIS — M25669 Stiffness of unspecified knee, not elsewhere classified: Secondary | ICD-10-CM | POA: Insufficient documentation

## 2011-02-15 DIAGNOSIS — R262 Difficulty in walking, not elsewhere classified: Secondary | ICD-10-CM | POA: Insufficient documentation

## 2011-02-15 DIAGNOSIS — IMO0001 Reserved for inherently not codable concepts without codable children: Secondary | ICD-10-CM | POA: Insufficient documentation

## 2011-02-15 DIAGNOSIS — M25569 Pain in unspecified knee: Secondary | ICD-10-CM | POA: Insufficient documentation

## 2011-02-18 ENCOUNTER — Ambulatory Visit: Payer: Medicare Other | Admitting: Rehabilitation

## 2011-02-20 ENCOUNTER — Encounter: Payer: Medicare Other | Admitting: Rehabilitation

## 2011-02-21 ENCOUNTER — Ambulatory Visit: Payer: Medicare Other | Admitting: Rehabilitation

## 2011-02-22 ENCOUNTER — Encounter: Payer: Medicare Other | Admitting: Rehabilitation

## 2011-02-25 ENCOUNTER — Ambulatory Visit: Payer: Medicare Other | Admitting: Rehabilitation

## 2011-02-27 ENCOUNTER — Ambulatory Visit: Payer: Medicare Other | Admitting: Rehabilitation

## 2011-03-04 ENCOUNTER — Ambulatory Visit: Payer: Medicare Other | Admitting: Rehabilitation

## 2011-03-05 ENCOUNTER — Ambulatory Visit: Payer: Medicare Other | Admitting: Rehabilitation

## 2011-04-01 ENCOUNTER — Encounter: Payer: Self-pay | Admitting: Internal Medicine

## 2011-04-01 DIAGNOSIS — I498 Other specified cardiac arrhythmias: Secondary | ICD-10-CM

## 2011-05-21 ENCOUNTER — Other Ambulatory Visit: Payer: Self-pay | Admitting: *Deleted

## 2011-05-21 MED ORDER — FLECAINIDE ACETATE 100 MG PO TABS: 100.0000 mg | ORAL_TABLET | Freq: Two times a day (BID) | ORAL | Status: DC

## 2011-06-27 ENCOUNTER — Encounter (HOSPITAL_COMMUNITY): Payer: Self-pay | Admitting: Emergency Medicine

## 2011-06-27 ENCOUNTER — Emergency Department (HOSPITAL_COMMUNITY): Payer: Medicare Other

## 2011-06-27 ENCOUNTER — Emergency Department (HOSPITAL_COMMUNITY)
Admission: EM | Admit: 2011-06-27 | Discharge: 2011-06-27 | Disposition: A | Discharge status: Home / Self Care | Payer: Medicare Other | Attending: Emergency Medicine | Admitting: Emergency Medicine

## 2011-06-27 DIAGNOSIS — Z7982 Long term (current) use of aspirin: Secondary | ICD-10-CM | POA: Insufficient documentation

## 2011-06-27 DIAGNOSIS — I059 Rheumatic mitral valve disease, unspecified: Secondary | ICD-10-CM | POA: Insufficient documentation

## 2011-06-27 DIAGNOSIS — R0789 Other chest pain: Secondary | ICD-10-CM | POA: Insufficient documentation

## 2011-06-27 DIAGNOSIS — M25519 Pain in unspecified shoulder: Secondary | ICD-10-CM | POA: Insufficient documentation

## 2011-06-27 DIAGNOSIS — M549 Dorsalgia, unspecified: Secondary | ICD-10-CM | POA: Insufficient documentation

## 2011-06-27 DIAGNOSIS — Z950 Presence of cardiac pacemaker: Secondary | ICD-10-CM | POA: Insufficient documentation

## 2011-06-27 DIAGNOSIS — Z79899 Other long term (current) drug therapy: Secondary | ICD-10-CM | POA: Insufficient documentation

## 2011-06-27 DIAGNOSIS — I4891 Unspecified atrial fibrillation: Secondary | ICD-10-CM | POA: Insufficient documentation

## 2011-06-27 DIAGNOSIS — Z8546 Personal history of malignant neoplasm of prostate: Secondary | ICD-10-CM | POA: Insufficient documentation

## 2011-06-27 LAB — BASIC METABOLIC PANEL
BUN: 30 mg/dL — ABNORMAL HIGH (ref 6–23)
CO2: 22 mEq/L (ref 19–32)
Calcium: 9.5 mg/dL (ref 8.4–10.5)
Creatinine, Ser: 1.18 mg/dL (ref 0.50–1.35)

## 2011-06-27 LAB — DIFFERENTIAL
Basophils Absolute: 0 10*3/uL (ref 0.0–0.1)
Eosinophils Relative: 2 % (ref 0–5)
Lymphocytes Relative: 31 % (ref 12–46)
Monocytes Absolute: 0.3 10*3/uL (ref 0.1–1.0)

## 2011-06-27 LAB — POCT I-STAT TROPONIN I: Troponin i, poc: 0.01 ng/mL (ref 0.00–0.08)

## 2011-06-27 LAB — CBC
HCT: 37.1 % — ABNORMAL LOW (ref 39.0–52.0)
MCHC: 35.3 g/dL (ref 30.0–36.0)
MCV: 89.4 fL (ref 78.0–100.0)
RDW: 17 % — ABNORMAL HIGH (ref 11.5–15.5)

## 2011-06-27 MED ORDER — SODIUM CHLORIDE 0.9 % IV BOLUS (SEPSIS)
500.0000 mL | Freq: Once | INTRAVENOUS | Status: AC
  Administered 2011-06-27: 500 mL via INTRAVENOUS

## 2011-06-27 NOTE — ED Notes (Addendum)
Pt stated that he noticed that his veins were distended on both arms. No pain. And vein distention decrease when he raises his arms. No redness. Pt also stated that he was having bilateral shoulder blade discomfort. Pt stated that the discomfort went away when he laid down. No pain any longer. No CP, No SOB. Will continue to monitor.

## 2011-06-27 NOTE — ED Provider Notes (Addendum)
History     CSN: [id number]  Arrival date & time 06/27/11  0013   First MD Initiated Contact with Patient 06/27/11 0124      Chief Complaint  Patient presents with  . Shoulder Pain    (Consider location/radiation/quality/duration/timing/severity/associated sxs/prior treatment) HPI Patient is a 78 year old male with history of multiple medical problems who presents to the emergency department for evaluation of several moments of mid back pain radiating to the bilateral shoulders that occurred while the patient was standing up and mixing some coffee. Following this brief sensation patient noted that the veins in his hands appeared particularly prominent. Patient's symptoms were resolved after laying down but he presented as he wanted to make sure that everything was okay. He currently denies any pain, nausea, vomiting, or neurologic symptoms. Patient does continue to note that he feels that his hands appeared to have very prominent veins dorsally. There are no other associated or modifying factors. Past Medical History  Diagnosis Date  . Bradycardia     s/p PPM  . Atrial flutter     s/p ablation  . Atrial fibrillation   . Prostate cancer 12-21-2003    12-7-seed implants done  . Hematoma   . Mitral regurgitation     mild echo 2012  . Pacemaker --[employer]   . Orthostatic lightheadedness   . History of difficult shoulder delivery, resulting in fractured clavicle of infant, currently pregnant     Please note- pt is not "currently pregnant" but does have a history of clavicle fx during delivery    Past Surgical History  Procedure Date  . Tonsillectomy as child  . Arthrscopic left knee surgery 02-11-2007  . Cardioversion 09-04-2006  . Av node ablation 04-06-2007  . Subdural hematoma evacuation via craniotomy 12-16-2007  . Rotator cuff repair w/ distal clavicle excision 08-31-2010    left  . Pacemaker insertion     [employer] Insignia   . Total knee arthroplasty 12/18/2010   Procedure: TOTAL KNEE ARTHROPLASTY;  Surgeon: Shelda Pal;  Location: [facility];  Service: Orthopedics;  Laterality: Left;    History reviewed. No pertinent family history.  History  Substance Use Topics  . Smoking status: Never Smoker   . Smokeless tobacco: Never Used  . Alcohol Use: 8.4 oz/week    14 Glasses of wine per week     2 glasses wine day      Review of Systems  Constitutional: Negative.   HENT: Negative.   Eyes: Negative.   Respiratory: Negative.   Cardiovascular: Negative.   Gastrointestinal: Negative.   Genitourinary: Negative.   Musculoskeletal: Positive for back pain.       As described in history of present illness  Skin: Negative.   Neurological: Negative.   Hematological: Negative.   Psychiatric/Behavioral: Negative.   All other systems reviewed and are negative.    Allergies  Review of patient's allergies indicates no known allergies.  Home Medications   Current Outpatient Rx  Name Route Sig Dispense Refill  . ASPIRIN 81 MG PO TABS Oral Take 81 mg by mouth daily.     Marland Kitchen FERROUS SULFATE 325 (65 FE) MG PO TABS Oral Take 325 mg by mouth daily with breakfast.    . FLECAINIDE ACETATE 100 MG PO TABS Oral Take 1 tablet (100 mg total) by mouth 2 (two) times daily. 180 tablet 2  . GLUCOSAMINE-CHONDROIT-VIT C-MN PO CAPS Oral Take by mouth 2 (two) times daily.      Marland Kitchen LEVOTHYROXINE SODIUM 150 MCG PO  TABS Oral Take 150 mcg by mouth daily.    . CEREFOLIN NAC 6-2-600 MG PO TABS Oral Take 1 tablet by mouth daily.    Marland Kitchen ONE-DAILY MULTI VITAMINS PO TABS Oral Take 1 tablet by mouth daily.      Marland Kitchen FISH OIL 300 MG PO CAPS Oral Take by mouth daily.      Marland Kitchen RA FIBER SUPPLEMENT PO Oral Take by mouth. Three tablets each day (dietary fiber 6g- soluble fiber 6g)       BP 139/82  Pulse 58  Temp 97.7 F (36.5 C) (Oral)  Resp 12  SpO2 98%  Physical Exam GEN: Well-developed, well-nourished male in no distress HEENT: Atraumatic, normocephalic. Oropharynx clear without  erythema EYES: PERRLA BL, no scleral icterus. NECK: Trachea midline, no meningismus CV: regular rate and rhythm. No murmurs, rubs, or gallops PULM: No respiratory distress.  No crackles, wheezes, or rales. GI: soft, non-tender. No guarding, rebound, or tenderness. + bowel sounds  GU: deferred Neuro: cranial nerves grossly 2-12 intact, no abnormalities of strength or sensation, A and O x 3 MSK: Patient moves all 4 extremities symmetrically, no deformity, edema, or injury noted Skin: No rashes petechiae, purpura, or jaundice Psych: no abnormality of mood  ED Course  Procedures (including critical care time)  Indication: atypical chest pain Please note this EKG was reviewed extemporaneously by myself.  Date: 06/27/2011   Date: 06/27/2011  Rate: 60  Rhythm: AV paced rhythm, prolonged AV conduction  QRS Axis: normal  Intervals: normal  ST/T Wave abnormalities: nonspecific T wave changes  Conduction Disutrbances:none  Narrative Interpretation:   Old EKG Reviewed: unchanged      Labs Reviewed  CBC - Abnormal; Notable for the following:    RBC 4.15 (*)     HCT 37.1 (*)     RDW 17.0 (*)     All other components within normal limits  BASIC METABOLIC PANEL - Abnormal; Notable for the following:    BUN 30 (*)     GFR calc non Af Amer 58 (*)     GFR calc Af Amer 67 (*)     All other components within normal limits  POCT I-STAT TROPONIN I  DIFFERENTIAL  POCT I-STAT TROPONIN I   Dg Chest 2 View  06/27/2011  *RADIOLOGY REPORT*  Clinical Data: Upper back pain/shoulder pain.  CHEST - 2 VIEW  Comparison: 08/30/2010  Findings: Mild osteopenia.Pacer with leads at right atrium and right ventricle.  No lead discontinuity.  Remote distal left clavicular trauma. Midline trachea.  Normal heart size and mediastinal contours for age.  No pleural effusion or pneumothorax. At least one right lung granuloma. No congestive failure.  IMPRESSION: No acute cardiopulmonary disease.  Original Report  Authenticated By: Consuello Bossier, M.D.     1. Chest pain, atypical       MDM  Patient is a 78 year old male who presented complaining of brief sensation of pain between his shoulder blades with no associated chest pain, lightheadedness, shortness of breath, or diaphoresis. He noted that he had prominent veins noted on the dorsum of both hands following this episode. Patient denied any other symptoms. Aside from concern over the veins in his hands patient had no symptoms on presentation to the emergency department. Given his conjugated past medical history workup for possible atypical chest pain was performed. EKG did not show any significant changes and chest x-ray was unremarkable including no evidence of widened mediastinum that might raise concern for aortic dissection. Patient was hemodynamically  stable. CBC and BMP were unremarkable as were 0 and 3 hour troponins. Patient remained comfortable. He is normally followed by Dr. Merlene Laughter as his primary care physician. I reported that he should followup with Dr. Pete Glatter regarding his concerns over the veins in his hands. Patient had no history of thromboembolic disease in pulses were 2+ radially bilaterally. Patient did not have any skin changes that might suggest a phlebitis either. Patient was discharged home in good condition.       Cyndra Numbers, MD 06/28/11 1191  Cyndra Numbers, MD 07/25/11 2007

## 2011-06-27 NOTE — Discharge Instructions (Signed)
Chest Pain (Nonspecific) It is often hard to give a specific diagnosis for the cause of chest pain. There is always a chance that your pain could be related to something serious, such as a heart attack or a blood clot in the lungs. You need to follow up with your caregiver for further evaluation. CAUSES   Heartburn.   Pneumonia or bronchitis.   Anxiety or stress.   Inflammation around your heart (pericarditis) or lung (pleuritis or pleurisy).   A blood clot in the lung.   A collapsed lung (pneumothorax). It can develop suddenly on its own (spontaneous pneumothorax) or from injury (trauma) to the chest.   Shingles infection (herpes zoster virus).  The chest wall is composed of bones, muscles, and cartilage. Any of these can be the source of the pain.  The bones can be bruised by injury.   The muscles or cartilage can be strained by coughing or overwork.   The cartilage can be affected by inflammation and become sore (costochondritis).  DIAGNOSIS  Lab tests or other studies, such as X-rays, electrocardiography, stress testing, or cardiac imaging, may be needed to find the cause of your pain.  TREATMENT   Treatment depends on what may be causing your chest pain. Treatment may include:   Acid blockers for heartburn.   Anti-inflammatory medicine.   Pain medicine for inflammatory conditions.   Antibiotics if an infection is present.   You may be advised to change lifestyle habits. This includes stopping smoking and avoiding alcohol, caffeine, and chocolate.   You may be advised to keep your head raised (elevated) when sleeping. This reduces the chance of acid going backward from your stomach into your esophagus.   Most of the time, nonspecific chest pain will improve within 2 to 3 days with rest and mild pain medicine.  HOME CARE INSTRUCTIONS   If antibiotics were prescribed, take your antibiotics as directed. Finish them even if you start to feel better.   For the next few  days, avoid physical activities that bring on chest pain. Continue physical activities as directed.   Do not smoke.   Avoid drinking alcohol.   Only take over-the-counter or prescription medicine for pain, discomfort, or fever as directed by your caregiver.   Follow your caregiver's suggestions for further testing if your chest pain does not go away.   Keep any follow-up appointments you made. If you do not go to an appointment, you could develop lasting (chronic) problems with pain. If there is any problem keeping an appointment, you must call to reschedule.  SEEK MEDICAL CARE IF:   You think you are having problems from the medicine you are taking. Read your medicine instructions carefully.   Your chest pain does not go away, even after treatment.   You develop a rash with blisters on your chest.  SEEK IMMEDIATE MEDICAL CARE IF:   You have increased chest pain or pain that spreads to your arm, neck, jaw, back, or abdomen.   You develop shortness of breath, an increasing cough, or you are coughing up blood.   You have severe back or abdominal pain, feel nauseous, or vomit.   You develop severe weakness, fainting, or chills.   You have a fever.  THIS IS AN EMERGENCY. Do not wait to see if the pain will go away. Get medical help at once. Call your local emergency services (911 in [place] Do not drive yourself to the hospital. MAKE SURE YOU:   Understand these instructions.     Will watch your condition.   Will get help right away if you are not doing well or get worse.  Document Released: 10/10/2004 Document Revised: 12/20/2010 Document Reviewed: 08/06/2007 [employer] Patient Information 2012 [employer]. 

## 2011-06-27 NOTE — ED Notes (Signed)
C/o veins in bilateral arms being "very pronounced" x 1 hour.  States he had bilateral shoulder pain 1 hour ago that started while mixing coffee.  States pain was relieved once lying down.

## 2011-07-02 DIAGNOSIS — I498 Other specified cardiac arrhythmias: Secondary | ICD-10-CM

## 2011-09-01 ENCOUNTER — Emergency Department (INDEPENDENT_AMBULATORY_CARE_PROVIDER_SITE_OTHER): Payer: Medicare Other

## 2011-09-01 ENCOUNTER — Emergency Department (HOSPITAL_COMMUNITY)
Admission: EM | Admit: 2011-09-01 | Discharge: 2011-09-01 | Disposition: A | Discharge status: Home / Self Care | Payer: Medicare Other | Source: Home / Self Care | Attending: Emergency Medicine | Admitting: Emergency Medicine

## 2011-09-01 ENCOUNTER — Encounter (HOSPITAL_COMMUNITY): Payer: Self-pay | Admitting: *Deleted

## 2011-09-01 DIAGNOSIS — R109 Unspecified abdominal pain: Secondary | ICD-10-CM

## 2011-09-01 LAB — POCT URINALYSIS DIP (DEVICE)
Bilirubin Urine: NEGATIVE
Glucose, UA: NEGATIVE mg/dL
Ketones, ur: NEGATIVE mg/dL
Leukocytes, UA: NEGATIVE
pH: 6 (ref 5.0–8.0)

## 2011-09-01 MED ORDER — BISACODYL 5 MG PO TBEC: 5.0000 mg | DELAYED_RELEASE_TABLET | Freq: Two times a day (BID) | ORAL | Status: AC

## 2011-09-01 MED ORDER — POLYETHYLENE GLYCOL 3350 17 G PO PACK: 17.0000 g | PACK | Freq: Two times a day (BID) | ORAL | Status: AC

## 2011-09-01 NOTE — ED Provider Notes (Signed)
History     CSN: [id number]  Arrival date & time 09/01/11  1137   First MD Initiated Contact with Patient 09/01/11 1143      Chief Complaint  Patient presents with  . Flank Pain    (Consider location/radiation/quality/duration/timing/severity/associated sxs/prior treatment) HPI Comments: Patient presents urgent care this early afternoon complaining of sudden onset of left flank pain pain is somewhat constant and it's been present for the last 2 days. He does not recall any falls or injuries or gestures that could have precipitated this pain. (Patient points to left flank region). Patient denies having noticed any other symptoms such as abdominal pains, nausea, vomiting, no changes in urination pattern has he denies any discomfort, burning, or pressure sensation. Describes the pain is much more noticeable when he's laying flat on his bed. The point that last night he had to sit up in a chair to watch TV as the pain was bothering. Patient any diarrheas, feeling nauseous, and denies constitutional symptoms such as fevers, myalgias, arthralgias, unintentional weight loss or changes in appetite.  Patient is a 78 y.o. male presenting with flank pain. The history is provided by the patient.  Flank Pain This is a new problem. The current episode started 2 days ago. The problem occurs constantly. The problem has been gradually worsening. Pertinent negatives include no chest pain, no abdominal pain, no headaches and no shortness of breath. Exacerbated by: Laying flat on his back. Nothing relieves the symptoms. He has tried acetaminophen for the symptoms. The treatment provided no relief.    Past Medical History  Diagnosis Date  . Bradycardia     s/p PPM  . Atrial flutter     s/p ablation  . Atrial fibrillation   . Prostate cancer 12-21-2003    12-7-seed implants done  . Hematoma   . Mitral regurgitation     mild echo 2012  . Pacemaker --[employer]   . Orthostatic lightheadedness   .  Subdural hematoma     Past Surgical History  Procedure Date  . Tonsillectomy as child  . Arthrscopic left knee surgery 02-11-2007  . Cardioversion 09-04-2006  . Av node ablation 04-06-2007  . Subdural hematoma evacuation via craniotomy 12-16-2007  . Rotator cuff repair w/ distal clavicle excision 08-31-2010    left  . Pacemaker insertion     Guidant Insignia   . Total knee arthroplasty 12/18/2010    Procedure: TOTAL KNEE ARTHROPLASTY;  Surgeon: Shelda Pal;  Location: [facility];  Service: Orthopedics;  Laterality: Left;  . Joint replacement   . Brain surgery   . Cardioversion   . Cataract extraction   . Left knee arthroscopy     No family history on file.  History  Substance Use Topics  . Smoking status: Never Smoker   . Smokeless tobacco: Never Used  . Alcohol Use: 7.7 oz/week    7 Glasses of wine, 7 Drinks containing 0.5 oz of alcohol per week     2 glasses wine day      Review of Systems  Constitutional: Negative for chills, activity change and appetite change.  Respiratory: Negative for chest tightness and shortness of breath.   Cardiovascular: Negative for chest pain, palpitations and leg swelling.  Gastrointestinal: Negative for abdominal pain.  Genitourinary: Positive for flank pain. Negative for dysuria.  Musculoskeletal: Positive for back pain.  Skin: Negative for rash.  Neurological: Negative for dizziness, facial asymmetry and headaches.    Allergies  Review of patient's allergies indicates no known  allergies.  Home Medications   Current Outpatient Rx  Name Route Sig Dispense Refill  . ASPIRIN 81 MG PO TABS Oral Take 81 mg by mouth daily.     Marland Kitchen FLECAINIDE ACETATE 100 MG PO TABS Oral Take 1 tablet (100 mg total) by mouth 2 (two) times daily. 180 tablet 2  . GLUCOSAMINE CHONDR COMPLEX PO Oral Take by mouth.    Marland Kitchen LEVOTHYROXINE SODIUM 150 MCG PO TABS Oral Take 137 mcg by mouth daily.     . CEREFOLIN NAC 6-2-600 MG PO TABS Oral Take 1 tablet by mouth daily.     Marland Kitchen ONE-DAILY MULTI VITAMINS PO TABS Oral Take 1 tablet by mouth daily.      Marland Kitchen FISH OIL 300 MG PO CAPS Oral Take by mouth daily.      Marland Kitchen RA FIBER SUPPLEMENT PO Oral Take by mouth. Three tablets each day (dietary fiber 6g- soluble fiber 6g)     . BISACODYL 5 MG PO TBEC Oral Take 1 tablet (5 mg total) by mouth 2 (two) times daily. 14 tablet 0  . FERROUS SULFATE 325 (65 FE) MG PO TABS Oral Take 325 mg by mouth daily with breakfast.    . GLUCOSAMINE-CHONDROIT-VIT C-MN PO CAPS Oral Take by mouth 2 (two) times daily.      Marland Kitchen POLYETHYLENE GLYCOL 3350 PO PACK Oral Take 17 g by mouth 2 (two) times daily. 14 each 0    BP 182/85  Pulse 64  Temp 97.8 F (36.6 C) (Oral)  Resp 18  SpO2 99%  Physical Exam  Nursing note and vitals reviewed. Constitutional: Vital signs are normal. He appears well-developed and well-nourished.  Eyes: No scleral icterus.  Neck: Neck supple.  Cardiovascular: Normal rate.   Pulmonary/Chest: Effort normal and breath sounds normal. He has no wheezes. He has no rales. He exhibits no tenderness.  Abdominal: Soft. He exhibits no distension and no mass. There is no hepatosplenomegaly or hepatomegaly. There is no tenderness. There is no rigidity, no rebound, no guarding, no CVA tenderness, no tenderness at McBurney's point and negative Murphy's sign. No hernia.  Musculoskeletal:       Arms: Skin: Skin is warm. No rash noted. No erythema.    ED Course  Procedures (including critical care time)   Labs Reviewed  POCT URINALYSIS DIP (DEVICE)   Dg Abd 1 View  09/01/2011  *RADIOLOGY REPORT*  Clinical Data: Flank pain.  ABDOMEN - 1 VIEW  Comparison: None.  Findings: None there is moderate stool throughout the colon.  No evidence for small bowel obstruction.  The Visualized osseous structures have a normal appearance.  Prostate seeds overlie the symphysis pubis. No definite urinary tract calculi.  IMPRESSION: Moderate stool burden.  Original Report Authenticated By: Patterson Hammersmith, M.D.        MDM   Left flank pressure discomfort not reproducible on abdominal exam. Urine dip results were unremarkable. X-rays was consistent with significant stool burden. Have discussed with patient that this is a possibility that his perceiving pressure discomfort in his left flank induced by this cold splenic flexure related to constipation. Patient has been prescribed a combination of MiraLax and Dulcolax to be use for the next few days and have them course patient to walk and to hydrate himself well. Alternatively we discussed at if no improvement is noted or worsening symptoms such as increased pain, nausea, vomiting, fevers that she should go immediately to the emergency department for further evaluation. I suspect this is left flank  pain associated with constipation and increased stool burden. Patient understands treatment plan, followup care and symptoms that warrant further evaluation in the emergency department. Patient is comfortably sitting reading his electronic devise in a sitting position afebrile.      Jimmie Molly, MD 09/01/11 1346

## 2011-09-01 NOTE — ED Notes (Signed)
C/O left flank pain 2 days ago; pain is constant with varying severity.  Has difficulty laying down and sleeping due to flank pain becoming worse from pressure of bed.  Denies n/v/d, difficulty urinating, or fevers.  Took a left-over pain pill to help with discomfort.

## 2011-09-17 ENCOUNTER — Other Ambulatory Visit: Payer: Self-pay | Admitting: Gastroenterology

## 2011-09-17 DIAGNOSIS — R109 Unspecified abdominal pain: Secondary | ICD-10-CM

## 2011-09-19 ENCOUNTER — Ambulatory Visit
Admission: RE | Admit: 2011-09-19 | Discharge: 2011-09-19 | Disposition: A | Discharge status: Home / Self Care | Payer: Medicare Other | Source: Ambulatory Visit | Attending: Gastroenterology | Admitting: Gastroenterology

## 2011-09-19 DIAGNOSIS — R109 Unspecified abdominal pain: Secondary | ICD-10-CM

## 2011-10-01 DIAGNOSIS — I498 Other specified cardiac arrhythmias: Secondary | ICD-10-CM

## 2011-10-10 ENCOUNTER — Encounter: Payer: Self-pay | Admitting: Internal Medicine

## 2011-10-10 ENCOUNTER — Telehealth: Payer: Self-pay | Admitting: Internal Medicine

## 2011-10-10 ENCOUNTER — Ambulatory Visit (INDEPENDENT_AMBULATORY_CARE_PROVIDER_SITE_OTHER): Payer: Medicare Other | Admitting: Internal Medicine

## 2011-10-10 VITALS — BP 106/64 | HR 122 | Resp 18 | Ht 72.0 in | Wt 169.5 lb

## 2011-10-10 DIAGNOSIS — I4891 Unspecified atrial fibrillation: Secondary | ICD-10-CM

## 2011-10-10 DIAGNOSIS — Z950 Presence of cardiac pacemaker: Secondary | ICD-10-CM

## 2011-10-10 DIAGNOSIS — I442 Atrioventricular block, complete: Secondary | ICD-10-CM

## 2011-10-10 LAB — PACEMAKER DEVICE OBSERVATION
AL THRESHOLD: 1 V
DEVICE MODEL PM: 319544
RV LEAD THRESHOLD: 1 V

## 2011-10-10 MED ORDER — PROPRANOLOL HCL ER 60 MG PO CP24: 60.0000 mg | ORAL_CAPSULE | Freq: Every day | ORAL | Status: DC

## 2011-10-10 NOTE — Telephone Encounter (Signed)
New Problem:    Patient called in because he believes that he has slipped out of steady rhythm and would like to be seen today.  Patient had an accelrerated Heart Rate. Please call back.

## 2011-10-10 NOTE — Assessment & Plan Note (Signed)
Pacemaker interrogation today demonstrates underlying atrial tachycardia. The patient was temporarily programmed into the time mode and atrial pacing at 190 beats per minute resulted in termination of the patient's atrial tachycardia in restoration of sinus rhythm at 60 beats per minute with atrial ventricular pacing. I've asked the patient to follow back up with Dr. Graciela Husbands in the next week or 2. His echocardiogram demonstrates fairly marked ST-T wave abnormality on flecainide.

## 2011-10-10 NOTE — Telephone Encounter (Signed)
Please call home phone number 

## 2011-10-10 NOTE — Patient Instructions (Addendum)
Your physician recommends that you schedule a follow-up appointment next week with Dr Warren Danes to start Propanolol 60mg  daily that Neuro gave patient

## 2011-10-10 NOTE — Assessment & Plan Note (Addendum)
Interrogation of the patient's pacemaker today demonstrates that his underlying atrial rhythm appears to be an atrial tachycardia at 122 beats per minute. He is not in atrial fibrillation. The patient is on flecainide. He may well have a very slow atrial flutter. There is 1-1 conduction. After pace termination of the patient's atrial arrhythmia, he is maintaining sinus rhythm with AV sequential pacing. He has been prescribed propranolol for tremor. If the patient goes back into atrial flutter/tachycardia, hopefully this will improve his ventricular rate control.

## 2011-10-10 NOTE — Progress Notes (Signed)
HPI Mr. Dunshee returns today for an unscheduled office visit do to tachypalpitations.Marland Kitchen He is a very pleasant 78 year old man with a history of atrial flutter status post catheter ablation. The patient developed bradycardia and underwent permanent pacemaker insertion. He developed atrial fibrillation and has been placed on flecainide. The patient notes that he underwent a very strenuous exercise regimen several days ago. Since then he has felt rather poorly. He has minimal awareness of palpitations. He has minimal shortness of breath but no chest tightness. He has not had syncope. No Known Allergies   Current Outpatient Prescriptions  Medication Sig Dispense Refill  . aspirin 81 MG tablet Take 81 mg by mouth daily.       . ferrous sulfate 325 (65 FE) MG tablet Take 325 mg by mouth daily with breakfast.      . flecainide (TAMBOCOR) 100 MG tablet Take 1 tablet (100 mg total) by mouth 2 (two) times daily.  180 tablet  2  . Glucosamine-Chondroit-Vit C-Mn CAPS Take by mouth 2 (two) times daily.        . Glucosamine-Chondroitin (GLUCOSAMINE CHONDR COMPLEX PO) Take by mouth.      . levothyroxine (SYNTHROID, LEVOTHROID) 150 MCG tablet Take 137 mcg by mouth daily.       . Multiple Vitamin (MULTIVITAMIN) tablet Take 1 tablet by mouth daily.        . Omega-3 Fatty Acids (FISH OIL) 300 MG CAPS Take by mouth daily.        . Psyllium (RA FIBER SUPPLEMENT PO) Take by mouth. Three tablets each day (dietary fiber 6g- soluble fiber 6g)       . propranolol ER (INDERAL LA) 60 MG 24 hr capsule Take 1 capsule (60 mg total) by mouth daily.         Past Medical History  Diagnosis Date  . Bradycardia     s/p PPM  . Atrial flutter     s/p ablation  . Atrial fibrillation   . Prostate cancer 12-21-2003    12-7-seed implants done  . Hematoma   . Mitral regurgitation     mild echo 2012  . Pacemaker --[employer]   . Orthostatic lightheadedness   . Subdural hematoma     ROS:   All systems reviewed and  negative except as noted in the HPI.   Past Surgical History  Procedure Date  . Tonsillectomy as child  . Arthrscopic left knee surgery 02-11-2007  . Cardioversion 09-04-2006  . Av node ablation 04-06-2007  . Subdural hematoma evacuation via craniotomy 12-16-2007  . Rotator cuff repair w/ distal clavicle excision 08-31-2010    left  . Pacemaker insertion     [employer]ign[employer]   . Total knee arthroplasty 12/18/2010    Procedure: TOTAL KNEE ARTHROPLASTY;  Surgeon: Shelda Pal;  Location: [facility];  Service: Orthopedics;  Laterality: Left;  . Joint replacement   . Brain surgery   . Cardioversion   . Cataract extraction   . Left knee arthroscopy      No family history on file.   History   Social History  . Marital Status: Married    Spouse Name: N/A    Number of Children: N/A  . Years of Education: N/A   Occupational History  . Not on file.   Social History Main Topics  . Smoking status: Never Smoker   . Smokeless tobacco: Never Used  . Alcohol Use: 7.7 oz/week    7 Glasses of wine, 7 Drinks containing 0.5 oz  of alcohol per week     2 glasses wine day  . Drug Use: No  . Sexually Active: Not on file   Other Topics Concern  . Not on file   Social History Narrative  . No narrative on file     BP 106/64  Pulse 122  Resp 18  Ht 6' (1.829 m)  Wt 169 lb 8 oz (76.885 kg)  BMI 22.99 kg/m2  Physical Exam:  Well appearing elderly man, NAD HEENT: Unremarkable Neck:  7 cm JVD, no thyromegally Lungs:  Clear with no wheezes, rales, or rhonchi.  HEART:  Regular tachycardia rhythm, no murmurs, no rubs, no clicks Abd:  soft, positive bowel sounds, no organomegally, no rebound, no guarding Ext:  2 plus pulses, no edema, no cyanosis, no clubbing Skin:  No rashes no nodules Neuro:  CN II through XII intact, motor grossly intact  EKG Wide QRS tachycardia at 122 beats per minute DEVICE  Normal device function.  See PaceArt for details. Underlying atrial flutter/tachycardia  at 122 beats per minute  Assess/Plan:

## 2011-10-10 NOTE — Telephone Encounter (Signed)
States he thinks his heart is out of rhythm and blood pressure is low.  Scheduled pt to come in for an EKG and B/P check this afternoon.  Pt agrees.

## 2011-10-22 ENCOUNTER — Encounter: Payer: Medicare Other | Admitting: Internal Medicine

## 2011-10-23 ENCOUNTER — Encounter: Payer: Self-pay | Admitting: Internal Medicine

## 2011-10-23 ENCOUNTER — Ambulatory Visit (INDEPENDENT_AMBULATORY_CARE_PROVIDER_SITE_OTHER): Payer: Medicare Other | Admitting: Internal Medicine

## 2011-10-23 VITALS — BP 100/70 | HR 60 | Ht 72.0 in | Wt 169.0 lb

## 2011-10-23 DIAGNOSIS — I498 Other specified cardiac arrhythmias: Secondary | ICD-10-CM

## 2011-10-23 DIAGNOSIS — I471 Supraventricular tachycardia: Secondary | ICD-10-CM | POA: Insufficient documentation

## 2011-10-23 DIAGNOSIS — Z950 Presence of cardiac pacemaker: Secondary | ICD-10-CM

## 2011-10-23 DIAGNOSIS — I4891 Unspecified atrial fibrillation: Secondary | ICD-10-CM

## 2011-10-23 DIAGNOSIS — I442 Atrioventricular block, complete: Secondary | ICD-10-CM

## 2011-10-23 LAB — PACEMAKER DEVICE OBSERVATION
AL AMPLITUDE: 3.1 mv
AL THRESHOLD: 0.8 V
DEVICE MODEL PM: 319544
RV LEAD AMPLITUDE: 5.6 mv

## 2011-10-23 MED ORDER — FLECAINIDE ACETATE 100 MG PO TABS: 50.0000 mg | ORAL_TABLET | Freq: Two times a day (BID) | ORAL | Status: DC

## 2011-10-23 NOTE — Assessment & Plan Note (Signed)
Intermittent

## 2011-10-23 NOTE — Assessment & Plan Note (Signed)
The patient had atrial tachycardia terminated by [facility] by Dr. Ladona Ridgel. We have reprogrammed his device to allow [place] to detect tachycardias faster than 110; he is mostly atrial pace is a we should have very little overlap. We will continue his flecainide, although we will decrease the dose from 100-50 twice daily as his first-degree AV block is new since June suggesting significant flecainide effect, and perhaps excess

## 2011-10-23 NOTE — Progress Notes (Signed)
Patient Care Team: Hal Stormy Fabian, MD as PCP - General (Internal Medicine)   HPI  [name] is a 78 y.o. male Seen in followup for atrial fibrillation for which she was placed on flecainide. He was seen a few weeks ago and was found to have a broad complex rhythm that was concerning for flecainide toxicity. There was an atrial tachycardia that Dr. Ladona Ridgel successfully pace terminated. Review of the histograms suggested that this was a unique event. (today).  He has a history of catheter ablation of atrial flutter. Past Medical History  Diagnosis Date  . Bradycardia     s/p PPM  . Atrial flutter     s/p ablation  . Atrial fibrillation   . Prostate cancer 12-21-2003    12-7-seed implants done  . Hematoma   . Mitral regurgitation     mild echo 2012  . Pacemaker --[employer]   . Orthostatic lightheadedness   . Subdural hematoma     Past Surgical History  Procedure Date  . Tonsillectomy as child  . Arthrscopic left knee surgery 02-11-2007  . Cardioversion 09-04-2006  . Av node ablation 04-06-2007  . Subdural hematoma evacuation via craniotomy 12-16-2007  . Rotator cuff repair w/ distal clavicle excision 08-31-2010    left  . Pacemaker insertion     [employer]ign[employer]   . Total knee arthroplasty 12/18/2010    Procedure: TOTAL KNEE ARTHROPLASTY;  Surgeon: Shelda Pal;  Location: [facility];  Service: Orthopedics;  Laterality: Left;  . Joint replacement   . Brain surgery   . Cardioversion   . Cataract extraction   . Left knee arthroscopy     Current Outpatient Prescriptions  Medication Sig Dispense Refill  . aspirin 81 MG tablet Take 81 mg by mouth daily.       . flecainide (TAMBOCOR) 100 MG tablet Take 1 tablet (100 mg total) by mouth 2 (two) times daily.  180 tablet  2  . Glucosamine-Chondroit-Vit C-Mn CAPS Take by mouth 2 (two) times daily.        . Glucosamine-Chondroitin (GLUCOSAMINE CHONDR COMPLEX PO) Take by mouth.      . levothyroxine (SYNTHROID,  LEVOTHROID) 150 MCG tablet Take 137 mcg by mouth daily.       . Multiple Vitamin (MULTIVITAMIN) tablet Take 1 tablet by mouth daily.        . Omega-3 Fatty Acids (FISH OIL) 300 MG CAPS Take by mouth daily.        . propranolol ER (INDERAL LA) 60 MG 24 hr capsule Take 1 capsule (60 mg total) by mouth daily.      . Psyllium (RA FIBER SUPPLEMENT PO) Take by mouth. Three tablets each day (dietary fiber 6g- soluble fiber 6g)       . rOPINIRole (REQUIP) 0.25 MG tablet         No Known Allergies  Review of Systems negative except from HPI and PMH  Physical Exam BP 100/70  Pulse 60  Ht 6' (1.829 m)  Wt 169 lb (76.658 kg)  BMI 22.92 kg/m2  SpO2 99% Well developed and well nourished in no acute distress HENT normal E scleral and icterus clear Neck Supple JVP flat; carotids brisk and full Clear to ausculation  egular rate and rhythm, no murmurs gallops or rub Soft with active bowel sounds No clubbing cyanosis none Edema Alert and oriented, grossly normal motor and sensory function Skin Warm and Dry  Electrocardiogram today demonstrates sinus rhythm with AV pacing and a PR  interval of 240 ms Assessment and  Plan

## 2011-10-23 NOTE — Patient Instructions (Addendum)
Decrease Flecainide dose to 50mg  twice daily.

## 2011-10-23 NOTE — Assessment & Plan Note (Signed)
No recurrent atrial fibrillation.

## 2011-10-23 NOTE — Assessment & Plan Note (Signed)
The patient's device was interrogated and the information was fully reviewed.  The device was reprogrammed to  Allow it to check atrial tachycardia in the 120 range

## 2011-10-24 ENCOUNTER — Encounter: Payer: Medicare Other | Admitting: Internal Medicine

## 2011-11-06 NOTE — Addendum Note (Signed)
Addended by: Marrion Coy L on: 11/06/2011 03:01 PM   Modules accepted: Orders

## 2011-12-31 DIAGNOSIS — I498 Other specified cardiac arrhythmias: Secondary | ICD-10-CM

## 2012-02-18 ENCOUNTER — Telehealth: Payer: Self-pay | Admitting: Internal Medicine

## 2012-02-18 DIAGNOSIS — I4891 Unspecified atrial fibrillation: Secondary | ICD-10-CM

## 2012-02-18 MED ORDER — PROPRANOLOL HCL ER 60 MG PO CP24: 60.0000 mg | ORAL_CAPSULE | Freq: Every day | ORAL | Status: DC

## 2012-02-18 NOTE — Telephone Encounter (Signed)
New problem:   Beta blocker was discussed by Dr. Graciela Husbands . However, the urologist had put patient on medication there is a delay from the urologist in refill medication.

## 2012-02-18 NOTE — Telephone Encounter (Signed)
Follow-up: ° ° ° °Patient called in returning your call.  Please call back. °

## 2012-02-18 NOTE — Telephone Encounter (Signed)
I left a message for the patient to call. 

## 2012-02-18 NOTE — Telephone Encounter (Signed)
I spoke with the patient. He is needing a refill on his beta blocker- propranolol ER 60 mg once daily refilled. This was originally done through Dr. Marlis Edelson office, but they will not refill until he is seen next week. I explained to the patient I would refill this for him. [facility] [address] [facility].

## 2012-03-31 DIAGNOSIS — I498 Other specified cardiac arrhythmias: Secondary | ICD-10-CM

## 2012-04-14 ENCOUNTER — Encounter: Payer: Self-pay | Admitting: Internal Medicine

## 2012-04-14 ENCOUNTER — Ambulatory Visit (INDEPENDENT_AMBULATORY_CARE_PROVIDER_SITE_OTHER): Payer: Medicare Other | Admitting: Internal Medicine

## 2012-04-14 VITALS — BP 112/60 | HR 61 | Ht 72.0 in | Wt 180.0 lb

## 2012-04-14 DIAGNOSIS — I4891 Unspecified atrial fibrillation: Secondary | ICD-10-CM

## 2012-04-14 DIAGNOSIS — I442 Atrioventricular block, complete: Secondary | ICD-10-CM

## 2012-04-14 DIAGNOSIS — I471 Supraventricular tachycardia: Secondary | ICD-10-CM

## 2012-04-14 DIAGNOSIS — R42 Dizziness and giddiness: Secondary | ICD-10-CM

## 2012-04-14 DIAGNOSIS — I498 Other specified cardiac arrhythmias: Secondary | ICD-10-CM

## 2012-04-14 DIAGNOSIS — Z950 Presence of cardiac pacemaker: Secondary | ICD-10-CM

## 2012-04-14 LAB — PACEMAKER DEVICE OBSERVATION
AL AMPLITUDE: 2.4 mv
BRDY-0002RV: 60 {beats}/min
DEVICE MODEL PM: 319544
RV LEAD AMPLITUDE: 9.9 mv
RV LEAD IMPEDENCE PM: 490 Ohm

## 2012-04-14 NOTE — Assessment & Plan Note (Signed)
Intermittent. Heart rate control is adequate

## 2012-04-14 NOTE — Assessment & Plan Note (Signed)
The patient's device was interrogated.  The information was reviewed. No changes were made in the programming.    

## 2012-04-14 NOTE — Patient Instructions (Addendum)
Your physician has recommended you make the following change in your medication:  1) Stop flecainide. 2) check on the cost of Eliquis with your pharmacy.  Your physician wants you to follow-up in: 1 year with Dr. Graciela Husbands. You will receive a reminder letter in the mail two months in advance. If you don't receive a letter, please call our office to schedule the follow-up appointment.

## 2012-04-14 NOTE — Progress Notes (Signed)
Patient Care Team: Hal Stormy Fabian, MD as PCP - General (Internal Medicine)   HPI  [name] is a 79 y.o. male Seen in followup for atrial fibrillation for which  he was placed on flecainide. He was seen a few weeks ago and was found to have a broad complex rhythm that was concerning for flecainide toxicity. There was an atrial tachycardia that Dr. Ladona Ridgel successfully pace terminated.   It was elected to continue his flecainide. The device was reprogrammed to detect slower arrhythmias. The flecainide dose was decreased from 100-50 twice a day  He has no awareness of intercurrent atrial fibrillation. He notes that fatigue is chronic but no different. He does not take anticoagulants.    Past Medical History      Past Medical History  Diagnosis Date  . Bradycardia     s/p PPM  . Atrial flutter     s/p ablation  . Atrial fibrillation   . Prostate cancer 12-21-2003    12-7-seed implants done  . Hematoma   . Mitral regurgitation     mild echo 2012  . Pacemaker --[employer]   . Orthostatic lightheadedness   . Subdural hematoma     Past Surgical History  Procedure Laterality Date  . Tonsillectomy  as child  . Arthrscopic left knee surgery  02-11-2007  . Cardioversion  09-04-2006  . Av node ablation  04-06-2007  . Subdural hematoma evacuation via craniotomy  12-16-2007  . Rotator cuff repair w/ distal clavicle excision  08-31-2010    left  . Pacemaker insertion      [employer]   . Total knee arthroplasty  12/18/2010    Procedure: TOTAL KNEE ARTHROPLASTY;  Surgeon: Shelda Pal;  Location: [facility];  Service: Orthopedics;  Laterality: Left;  . Joint replacement    . Brain surgery    . Cardioversion    . Cataract extraction    . Left knee arthroscopy      Current Outpatient Prescriptions  Medication Sig Dispense Refill  . aspirin 81 MG tablet Take 81 mg by mouth daily.       . Calcium Citrate-Vitamin D (CITRACAL + D PO) Take by mouth.      . flecainide  (TAMBOCOR) 100 MG tablet Take 0.5 tablets (50 mg total) by mouth 2 (two) times daily.  180 tablet  2  . Glucosamine-Chondroitin (GLUCOSAMINE CHONDR COMPLEX PO) Take by mouth.      [name] Oil 500 MG CAPS Take by mouth.      . levothyroxine (SYNTHROID, LEVOTHROID) 150 MCG tablet Take 137 mcg by mouth daily.       . Methylfol-Methylcob-Acetylcyst (CEREFOLIN NAC PO) Take by mouth daily.      . Multiple Vitamin (MULTIVITAMIN) tablet Take 1 tablet by mouth daily.        . propranolol ER (INDERAL LA) 60 MG 24 hr capsule Take 1 capsule (60 mg total) by mouth daily.  90 capsule  3   No current facility-administered medications for this visit.    No Known Allergies  Review of Systems negative except from HPI and PMH  Physical Exam BP 112/60  Pulse 61  Ht 6' (1.829 m)  Wt 180 lb (81.647 kg)  BMI 24.41 kg/m2  SpO2 99% Well developed and well nourished in no acute distress HENT normal E scleral and icterus clear Neck Supple JVP flat; carotids brisk and full Clear to ausculation irregularly irregular  no murmurs gallops or rub Soft with active bowel sounds  No clubbing cyanosis none Edema Alert and oriented, grossly normal motor and sensory function Skin Warm and Dry    Assessment and  Plan

## 2012-04-14 NOTE — Assessment & Plan Note (Signed)
We reviewed the value of isometric maneuvers as well as the potential value of block for his bed

## 2012-04-14 NOTE — Assessment & Plan Note (Signed)
  He presents today with atrial fibrillation. He is unaware of his atrial arrhythmia today. He has had a 30% burden by virtue of his sister for evaluation. He is not on anticoagulation. We have reviewed this again. I've suggested that he consider apixaban. This is based on the data from the AVERROES trial

## 2012-04-15 ENCOUNTER — Telehealth: Payer: Self-pay | Admitting: Internal Medicine

## 2012-04-15 NOTE — Telephone Encounter (Signed)
The dose is 5 mg twice a day.\ Either pradaxa or xarelto can be looked at,  They may be similar relative to asa but we dont have data

## 2012-04-15 NOTE — Telephone Encounter (Signed)
Spoke with patient regarding Rx for Eliquis.  Patient states he saw Dr. Graciela Husbands yesterday and SK told him to check on price of Eliquis with pharmacy.  Patient states this will cost him $70/month.  States it is a Nurse, children's IV with his insurance co Herbalist) and that Dr. Graciela Husbands can claim an exception for patient but they do not know how much this will lower the cost.  Patient would like to know what Dr. Graciela Husbands recommends and also what is the dosage he will be taking? Please call home phone @ [phone].  May speak to wife if patient unavailable.  Routing to Dr. Graciela Husbands for advice

## 2012-04-15 NOTE — Telephone Encounter (Signed)
New Problem:     Patient called in wanting to speak with Dr. Odessa Fleming nurse about a medication change Dr. Graciela Husbands was discussing with him yesterday.  Please call back.

## 2012-04-16 ENCOUNTER — Telehealth: Payer: Self-pay | Admitting: Internal Medicine

## 2012-04-16 DIAGNOSIS — I4892 Unspecified atrial flutter: Secondary | ICD-10-CM

## 2012-04-16 NOTE — Telephone Encounter (Signed)
New Prob   Requesting to speak to nurse regarding a new prescription. Please call.

## 2012-04-16 NOTE — Telephone Encounter (Signed)
I spoke with the patient. He states he called [employer] back and was told that Dr. Graciela Husbands could start an exception process for Eliquis. I explained to the patient I wasn't sure exactly what that meant. This does not sound like a PA, he does not qualify for the lower co-pay card for the first year, and he does not qualify for indigent. He will contact the insurance company again and see if they can call us or if there is a # to call directly to speak with someone about this. He did check on Pradaxa and Xarelto and they will be a lower cost. He will call us back.

## 2012-04-16 NOTE — Telephone Encounter (Signed)
I spoke with the patient. He has been made aware of Dr. Odessa Fleming recommendations for the dose on Eliquis. I have also given him the names of Pradaxa at 150 mg BID and Xarelto at 20 mg once daily. He will check on the cost of these drugs as well. I have advised of potential side effects with Pradaxa. The patient was also stating his insurance had stated Dr. Graciela Husbands could claim an exception for Eliquis. I asked if this was just in the form of a letter and the patient was not sure. I explained I have never heard of doing this with a drug, but will forward to Dr. Graciela Husbands to review.  We may just be able to do a letter stating why this drug is recommended and preferred for the patient to submit to his insurance. The patient is aware that Dr. Graciela Husbands is out starting today and through all of next week.

## 2012-04-18 ENCOUNTER — Other Ambulatory Visit: Payer: Self-pay | Admitting: Internal Medicine

## 2012-04-30 ENCOUNTER — Telehealth: Payer: Self-pay

## 2012-04-30 NOTE — Telephone Encounter (Signed)
Pt called today wanting to speak with you concerning a medication that Dr. Graciela Husbands recommended. [phone]

## 2012-04-30 NOTE — Telephone Encounter (Signed)
[name] at 04/30/2012 1:41 PM   Status: Signed            Pt called today wanting to speak with you concerning a medication that Dr. Graciela Husbands recommended. [phone]

## 2012-04-30 NOTE — Telephone Encounter (Signed)
I called and spoke with the patient. He gave me the number to [facility] to call regarding a letter of exception. [phone], member ID- [id number]. Sherri Rad, RN, BSN   Per [employer], this is for a tier exception to be done. Eliquis is a tier 4 drug. I was put in contact with a Charity fundraiser for [employer]. Since the patient has not tried another blood thinner, he more than likely will not get a tier exception. Xarelto and Pradaxa are tier 3 drugs, but I was not able to find out the cost to the patient. I will notify the patient, the request for tier exceptions for Eliquis will be put through. Per the nurse, we may get a call back to the office tomorrow regarding approval or denial. I will also notify the patient of where we are in the process. I will forward to Dr. Graciela Husbands and Deliah Goody, RN. I have spoken with the patient's wife and she is aware. Sherri Rad, RN, BSN

## 2012-04-30 NOTE — Telephone Encounter (Signed)
Call received back from [place], [place] with [employer]. Tier exception was denied. I will forward to Dr. Graciela Husbands to make him aware and see if we pursue xarelto as a tier 3. [employer] has notified the patient's wife of the denial. As an understanding, these drugs are covered, but at a higher co-pay.

## 2012-05-05 NOTE — Telephone Encounter (Signed)
Spoke with pt, he would like to take xarelto and pradaxa which ever dr Graciela Husbands prefers. He also wants to know if dr Graciela Husbands thinks ablation is an option for him again. Aware dr Graciela Husbands is not in the office today. Will ask and then call the pt back at his home number.

## 2012-05-05 NOTE — Telephone Encounter (Signed)
F/u    Pt calling regarding changing to a less expensive blood thinner

## 2012-05-06 NOTE — Telephone Encounter (Signed)
Discussed with dr Graciela Husbands, he would prefer xarelto. The last bmp in our chart is 08-2011. Will call dr Pete Glatter for more recent labs.

## 2012-05-06 NOTE — Telephone Encounter (Signed)
Pt aware will need bmp drawn for the dosing of the xarelto. The last one done with dr stoneking was 2012. Once we have the results of the bmp will be able to dose xarelto. Pt aware will follow up with dr Graciela Husbands regarding atrial fib ablation. The pt had atrial flutter ablation in the past. Pt agreed with this plan.

## 2012-05-08 ENCOUNTER — Other Ambulatory Visit (INDEPENDENT_AMBULATORY_CARE_PROVIDER_SITE_OTHER): Payer: Medicare Other

## 2012-05-08 ENCOUNTER — Encounter: Payer: Self-pay | Admitting: Internal Medicine

## 2012-05-08 DIAGNOSIS — I4892 Unspecified atrial flutter: Secondary | ICD-10-CM

## 2012-05-08 LAB — CBC WITH DIFFERENTIAL/PLATELET
Basophils Absolute: 0 10*3/uL (ref 0.0–0.1)
Eosinophils Absolute: 0.2 10*3/uL (ref 0.0–0.7)
HCT: 40.2 % (ref 39.0–52.0)
Lymphs Abs: 1.7 10*3/uL (ref 0.7–4.0)
MCHC: 34.1 g/dL (ref 30.0–36.0)
Monocytes Absolute: 0.6 10*3/uL (ref 0.1–1.0)
Monocytes Relative: 8.7 % (ref 3.0–12.0)
Neutro Abs: 4.3 10*3/uL (ref 1.4–7.7)
Platelets: 207 10*3/uL (ref 150.0–400.0)
RDW: 12.9 % (ref 11.5–14.6)

## 2012-05-08 LAB — BASIC METABOLIC PANEL
BUN: 30 mg/dL — ABNORMAL HIGH (ref 6–23)
CO2: 27 mEq/L (ref 19–32)
GFR: 62.1 mL/min (ref >60.00)
Glucose, Bld: 75 mg/dL (ref 70–99)
Potassium: 4.8 mEq/L (ref 3.5–5.1)

## 2012-05-11 MED ORDER — RIVAROXABAN 20 MG PO TABS: 20.0000 mg | ORAL_TABLET | Freq: Every day | ORAL | Status: DC

## 2012-05-11 NOTE — Telephone Encounter (Signed)
Spoke with pt, aware labs are normal. Discussed with the pharm md, pt to start xarelto 20 mg once daily. He will have labs repeated in 4 weeks. Co-pay card placed at the front desk for pick-up. Pt voiced understanding and agreed with this plan.

## 2012-05-13 ENCOUNTER — Other Ambulatory Visit: Payer: Self-pay | Admitting: *Deleted

## 2012-06-02 ENCOUNTER — Other Ambulatory Visit: Payer: Self-pay

## 2012-06-02 MED ORDER — CEREFOLIN NAC 6-2-600 MG PO TABS: 1.0000 | ORAL_TABLET | Freq: Every day | ORAL | Status: DC

## 2012-06-10 ENCOUNTER — Other Ambulatory Visit: Payer: Self-pay | Admitting: Emergency Medicine

## 2012-06-10 DIAGNOSIS — I4892 Unspecified atrial flutter: Secondary | ICD-10-CM

## 2012-06-10 MED ORDER — RIVAROXABAN 20 MG PO TABS: 20.0000 mg | ORAL_TABLET | Freq: Every day | ORAL | Status: DC

## 2012-06-12 ENCOUNTER — Telehealth: Payer: Self-pay | Admitting: Internal Medicine

## 2012-06-12 DIAGNOSIS — I4892 Unspecified atrial flutter: Secondary | ICD-10-CM

## 2012-06-12 MED ORDER — RIVAROXABAN 20 MG PO TABS: 20.0000 mg | ORAL_TABLET | Freq: Every day | ORAL | Status: DC

## 2012-06-12 NOTE — Telephone Encounter (Signed)
New Problem  Pt states he wants to know if Dr Graciela Husbands will increase his Xarelto 20 mg from 30 days to 90 days ?

## 2012-06-12 NOTE — Telephone Encounter (Signed)
Spoke with pt, script sent to pharm for 90 day supply

## 2012-06-30 ENCOUNTER — Encounter: Payer: Self-pay | Admitting: Internal Medicine

## 2012-06-30 DIAGNOSIS — I498 Other specified cardiac arrhythmias: Secondary | ICD-10-CM

## 2012-07-13 ENCOUNTER — Other Ambulatory Visit (HOSPITAL_COMMUNITY): Payer: Self-pay | Admitting: Podiatry

## 2012-07-13 DIAGNOSIS — R0989 Other specified symptoms and signs involving the circulatory and respiratory systems: Secondary | ICD-10-CM

## 2012-07-16 ENCOUNTER — Ambulatory Visit (HOSPITAL_COMMUNITY)
Admission: RE | Admit: 2012-07-16 | Discharge: 2012-07-16 | Disposition: A | Discharge status: Home / Self Care | Payer: Medicare Other | Source: Ambulatory Visit | Attending: Cardiovascular Disease | Admitting: Cardiovascular Disease

## 2012-07-16 DIAGNOSIS — R0989 Other specified symptoms and signs involving the circulatory and respiratory systems: Secondary | ICD-10-CM

## 2012-07-16 NOTE — Progress Notes (Signed)
Lower Extremity Arterial Duplex Completed. Negative. °[name] ° °

## 2012-07-22 ENCOUNTER — Ambulatory Visit: Payer: Self-pay | Admitting: Nurse Practitioner

## 2012-07-23 ENCOUNTER — Ambulatory Visit: Payer: Self-pay | Admitting: Nurse Practitioner

## 2012-08-17 ENCOUNTER — Ambulatory Visit: Payer: Self-pay | Admitting: Nurse Practitioner

## 2012-08-20 ENCOUNTER — Encounter: Payer: Self-pay | Admitting: Nurse Practitioner

## 2012-08-20 ENCOUNTER — Ambulatory Visit (INDEPENDENT_AMBULATORY_CARE_PROVIDER_SITE_OTHER): Payer: Medicare Other | Admitting: Nurse Practitioner

## 2012-08-20 VITALS — BP 111/77 | HR 73 | Ht 72.0 in | Wt 180.0 lb

## 2012-08-20 DIAGNOSIS — R259 Unspecified abnormal involuntary movements: Secondary | ICD-10-CM

## 2012-08-20 DIAGNOSIS — I6200 Nontraumatic subdural hemorrhage, unspecified: Secondary | ICD-10-CM

## 2012-08-20 DIAGNOSIS — S065XAA Traumatic subdural hemorrhage with loss of consciousness status unknown, initial encounter: Secondary | ICD-10-CM

## 2012-08-20 DIAGNOSIS — S065X9A Traumatic subdural hemorrhage with loss of consciousness of unspecified duration, initial encounter: Secondary | ICD-10-CM

## 2012-08-20 DIAGNOSIS — R251 Tremor, unspecified: Secondary | ICD-10-CM

## 2012-08-20 DIAGNOSIS — G3184 Mild cognitive impairment, so stated: Secondary | ICD-10-CM

## 2012-08-20 NOTE — Patient Instructions (Addendum)
Continue Inderal LA 60 mg daily for tremors. Continue to challenge mind with activity such as crossword puzzles, Suduko, playing bridge daily. Continue Cerefolin NAC. Followup with me in 6 months.

## 2012-08-20 NOTE — Progress Notes (Signed)
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 Small improvement since last visit with Cerefloin. Tremors are slightly worse left greater than right, etiology unclear whether benign essential or mild Parkinsonism secondary to remote history of left frontal subdural hematoma.  He returns for followup after last visit on 10/09/2011. He states improvement in his hand tremor since using Inderal LA 60 mg daily which he seems to be tolerating it quite well without significant side effects. He states his typing is a lot better. He does complain of mild orthostatic dizziness when he stands up. He is concerned about his short-term memory difficulties which he feels are slightly getting worse over the years. He forgets appointments but may remember them later. He needs to write himself constant notes and reminders. He does have a family history of dementia in his mother. 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 He has no new complaints. Despite his subjective worsening of memory on MMSE testing today he scored 29/30 which is unchanged from last visit.  UPDATE 08/20/12 (LL):[name] returns for follow up for memory loss and tremors.  His tremors are well controlled on Inderal LA and he continues to take Cerefolin NAC daily with no problems.  He has a pacemaker and takes Xarelto daily for atrial fibrillation.  He scores 30/30 on his MMSE today, with 4/4 clock drawing and naming 11 "f" words in 1 minute.  No new neurovascular problems.   REVIEW OF SYSTEMS: Full 14 system review of systems performed and notable only for: constitutional: N/A  cardiovascular: N/A respiratory: N/A endocrine: N/A  ear/nose/throat: Ringing in ears  Hematology/Lymph:  N/A musculoskeletal: joint pain skin: N/A genitourinary: urination problems Gastrointestinal: blood in stool allergy/immunology: N/A neurological: Memory loss, dizziness sleep: insomnia psychiatric: not enough sleep   ALLERGIES: No Known Allergies  HOME MEDICATIONS: Outpatient Prescriptions Prior to Visit  Medication Sig Dispense Refill  . Calcium Citrate-Vitamin D (CITRACAL + D PO) Take by mouth.      . Glucosamine-Chondroitin (GLUCOSAMINE CHONDR COMPLEX PO) Take by mouth.      [name] Oil 500 MG CAPS Take by mouth.      . levothyroxine (SYNTHROID, LEVOTHROID) 150 MCG tablet Take 137 mcg by mouth daily.       . Methylfol-Methylcob-Acetylcyst (CEREFOLIN NAC) 6-2-600 MG TABS Take 1 tablet by mouth daily.  90 each  1  . Multiple Vitamin (MULTIVITAMIN) tablet Take 1 tablet by mouth daily.        . propranolol ER (INDERAL LA) 60 MG 24 hr capsule Take 1 capsule (60 mg total) by mouth daily.  90 capsule  3  . Rivaroxaban (XARELTO) 20 MG TABS Take 1 tablet (20 mg total) by mouth daily.  90 tablet  3   No facility-administered medications prior to visit.    PAST MEDICAL HISTORY: Past Medical History  Diagnosis Date  . Bradycardia     s/p PPM  . Atrial flutter     s/p ablation  . Atrial fibrillation   . Prostate cancer 12-21-2003    12-7-seed implants done  . Hematoma   . Mitral regurgitation     mild echo 2012  . Pacemaker --[employer]   . Orthostatic lightheadedness   . Subdural hematoma     PAST SURGICAL HISTORY: Past Surgical  History  Procedure Laterality Date  . Tonsillectomy  as child  . Arthrscopic left knee surgery  02-11-2007  . Cardioversion  09-04-2006  . Av node ablation  04-06-2007  . Subdural hematoma evacuation via craniotomy  12-16-2007  . Rotator cuff repair w/ distal clavicle excision  08-31-2010    left  . Pacemaker insertion      [employer] Insignia   . Total knee arthroplasty  12/18/2010    Procedure: TOTAL KNEE ARTHROPLASTY;  Surgeon: Shelda Pal;  Location: [facility];  Service: Orthopedics;  Laterality: Left;  . Joint replacement    . Brain surgery    . Cardioversion    . Cataract extraction    . Left knee arthroscopy      FAMILY HISTORY: Family History  Problem Relation Age of Onset  . Aneurysm Father     SOCIAL HISTORY: History   Social History  . Marital Status: Married    Spouse Name: N/A    Number of Children: 2  . Years of Education: BA   Occupational History  .     Social History Main Topics  . Smoking status: Never Smoker   . Smokeless tobacco: Never Used  . Alcohol Use: 7.7 oz/week    7 Glasses of wine, 7 Drinks containing 0.5 oz of alcohol per week     Comment: 2 glasses wine day  . Drug Use: No  . Sexually Active: Not on file   Other Topics Concern  . Not on file   Social History Narrative   Patient lives at home with his spouse.   Caffeine Use: 2 cups of coffee     PHYSICAL EXAM  There were no vitals filed for this visit. There is no weight on file to calculate BMI.  Generalized: In no acute distress. Pleasant elderly Caucasian male.   Neck: Supple, no carotid bruits   Cardiac: Irregular rhythm, no murmur   Pulmonary: Clear to auscultation bilaterally   Musculoskeletal: Mild kyphosis  Neurological examination   Mentation: Alert oriented to time, place, history taking, language fluent, and casual conversation. MMSE 30/30 today, Clock drawing 4/4.   (MMSE 29/30 on 03/23/2012, 26/30 on 08/08/2011; deficits in orientation, attention and calculation. AST 14 on 03/23/2012; was 14 on 08/08/2011. Clock drawing 4/4. )  Cranial nerve II-XII: Pupils were equal round reactive to light extraocular movements were full, visual field were full on confrontational test. facial sensation and strength were normal. hearing was intact to finger rubbing bilaterally. Uvula tongue midline. head turning and shoulder shrug and were normal and symmetric. MOTOR: normal bulk and tone, full strength in the BUE,  BLE, fine finger movements normal, no pronator drift. Fine postural tremors left greater than right. No cogwheeling or bradykinesia. Mild tremulous writing. Able to draw a spiral and joints he points to make a straight line easily. SENSORY: normal and symmetric to light touch, pinprick COORDINATION: finger-nose-finger, heel-to-shin bilaterally, there was no truncal ataxia REFLEXES: Deep tendon reflexes are 2+ symmetric. Negative palmomental. GAIT/STATION: Short strides, steady gait with mild difficulty with tandem walking. Turns are smooth.   DIAGNOSTIC DATA (LABS, IMAGING, TESTING) - I reviewed patient records, labs, notes, testing and imaging myself where available.  Lab Results  Component Value Date   WBC 6.8 05/08/2012   HGB 13.7 05/08/2012   HCT 40.2 05/08/2012   MCV 96.7 05/08/2012   PLT 207.0 05/08/2012      Component Value Date/Time   NA 136 05/08/2012 1201   K 4.8 05/08/2012 1201  CL 102 05/08/2012 1201   CO2 27 05/08/2012 1201   GLUCOSE 75 05/08/2012 1201   BUN 30* 05/08/2012 1201   CREATININE 1.2 05/08/2012 1201   CALCIUM 8.9 05/08/2012 1201     ASSESSMENT AND PLAN 50 -year-old male with mild memory loss and cognitive impairment likely from age-related minimal cognitive impairment. Stable since last visit with Cerefolin. Tremors are slightly worse left greater than right, etiology unclear whether benign essential for mild Parkinsonianism secondary to remote history of left frontal subdural hematoma, controlled well on Inderal LA.  PLAN: Continue Inderal LA 60 mg daily for tremors. Continue to challenge mind with activity such as crossword puzzles, Suduko, playing bridge daily. Continue Cerefolin NAC. Followup with me in 12 months, sooner as needed.  Johany Hansman NP-C 08/20/2012, 2:54 PM  [facility] [address] [place], [place] [address] [phone]

## 2012-09-29 ENCOUNTER — Encounter: Payer: Self-pay | Admitting: Internal Medicine

## 2012-09-29 DIAGNOSIS — I498 Other specified cardiac arrhythmias: Secondary | ICD-10-CM

## 2012-10-27 ENCOUNTER — Telehealth: Payer: Self-pay | Admitting: *Deleted

## 2012-10-27 NOTE — Telephone Encounter (Signed)
Pt states his circulation test was normal, but he continues to have problems with his foot.  I encouraged pt to schedule follow up appt with Dr Leeanne Deed.  Pt agreed and I transferred to scheduler.

## 2012-11-04 ENCOUNTER — Encounter: Payer: Self-pay | Admitting: Podiatry

## 2012-11-04 ENCOUNTER — Ambulatory Visit (INDEPENDENT_AMBULATORY_CARE_PROVIDER_SITE_OTHER): Payer: Medicare Other | Admitting: Podiatry

## 2012-11-04 VITALS — BP 120/71 | HR 62 | Resp 20 | Ht 71.0 in | Wt 175.0 lb

## 2012-11-04 DIAGNOSIS — M7750 Other enthesopathy of unspecified foot: Secondary | ICD-10-CM

## 2012-11-04 MED ORDER — DICLOFENAC SODIUM 3 % TD GEL: TRANSDERMAL | Status: DC

## 2012-11-04 NOTE — Patient Instructions (Signed)
Apply gel to the sore tendon on the left foot as instructed.

## 2012-11-04 NOTE — Progress Notes (Signed)
Patient ID: [name], male   DOB: 1931-02-07, 79 y.o.   MRN: [id number]  Subjective: This patient presents complaining of persistent sharp pains localize to the dorsum of the left foot over the extensor hallucis longus tendon area. He complains of pain that can last up to 4 hours that can occur on an off weightbearing.  Vascular: The left DP is zero over four. The right DP is two over four. PTs are two over four bilaterally. Arterial Doppler exam 07/16/2012 demonstrated normal values without any evidence of arterial insufficiency in the lower extremities at rest.  Neurological: Sensation to 10 g monofilament wire is intact 10 over 10 locations bilaterally. Vibratory sensation intact bilaterally. Knee and ankle reflexes are weakly reactive bilaterally.  Musculoskeletal: The left hallux is in a hyperextended position at rest. He can actively dorsi flex the left hallux 5 over 5. The extensor hallucis longus tendon on the left is tender to palpation.  Assessment: Extensor hallucis longus tendinitis, left foot  Plan: 3% diclofenac gel dispense 100 g, apply 2 g 4 times a day to the tendon area as needed.  Reappoint at patient's request.  Ladasha Schnackenberg C.Leeanne Deed, DPM

## 2012-11-05 ENCOUNTER — Telehealth: Payer: Self-pay | Admitting: *Deleted

## 2012-11-05 NOTE — Telephone Encounter (Signed)
Pt states the Diclofenac 3% gel is close to $400.00 even with ins. The pharmacist said the Voltaren 1% gel would be less.  I told pt that I would inform Dr Leeanne Deed and call again.

## 2012-11-09 MED ORDER — DICLOFENAC SODIUM 1 % TD GEL: 4.0000 g | Freq: Four times a day (QID) | TRANSDERMAL | Status: DC

## 2012-11-09 NOTE — Telephone Encounter (Signed)
Informed pt's wife, I would be sending a rx for Voltaren 1% gel to his pharmacy, that he could call and check.

## 2012-11-29 IMAGING — CR DG CHEST 2V
2 series · 2 of 2 positions shown · non-contrast
Comparison: 08/30/2010

CLINICAL DATA: Upper back pain/shoulder pain.

CHEST - 2 VIEW

[w chest pa]
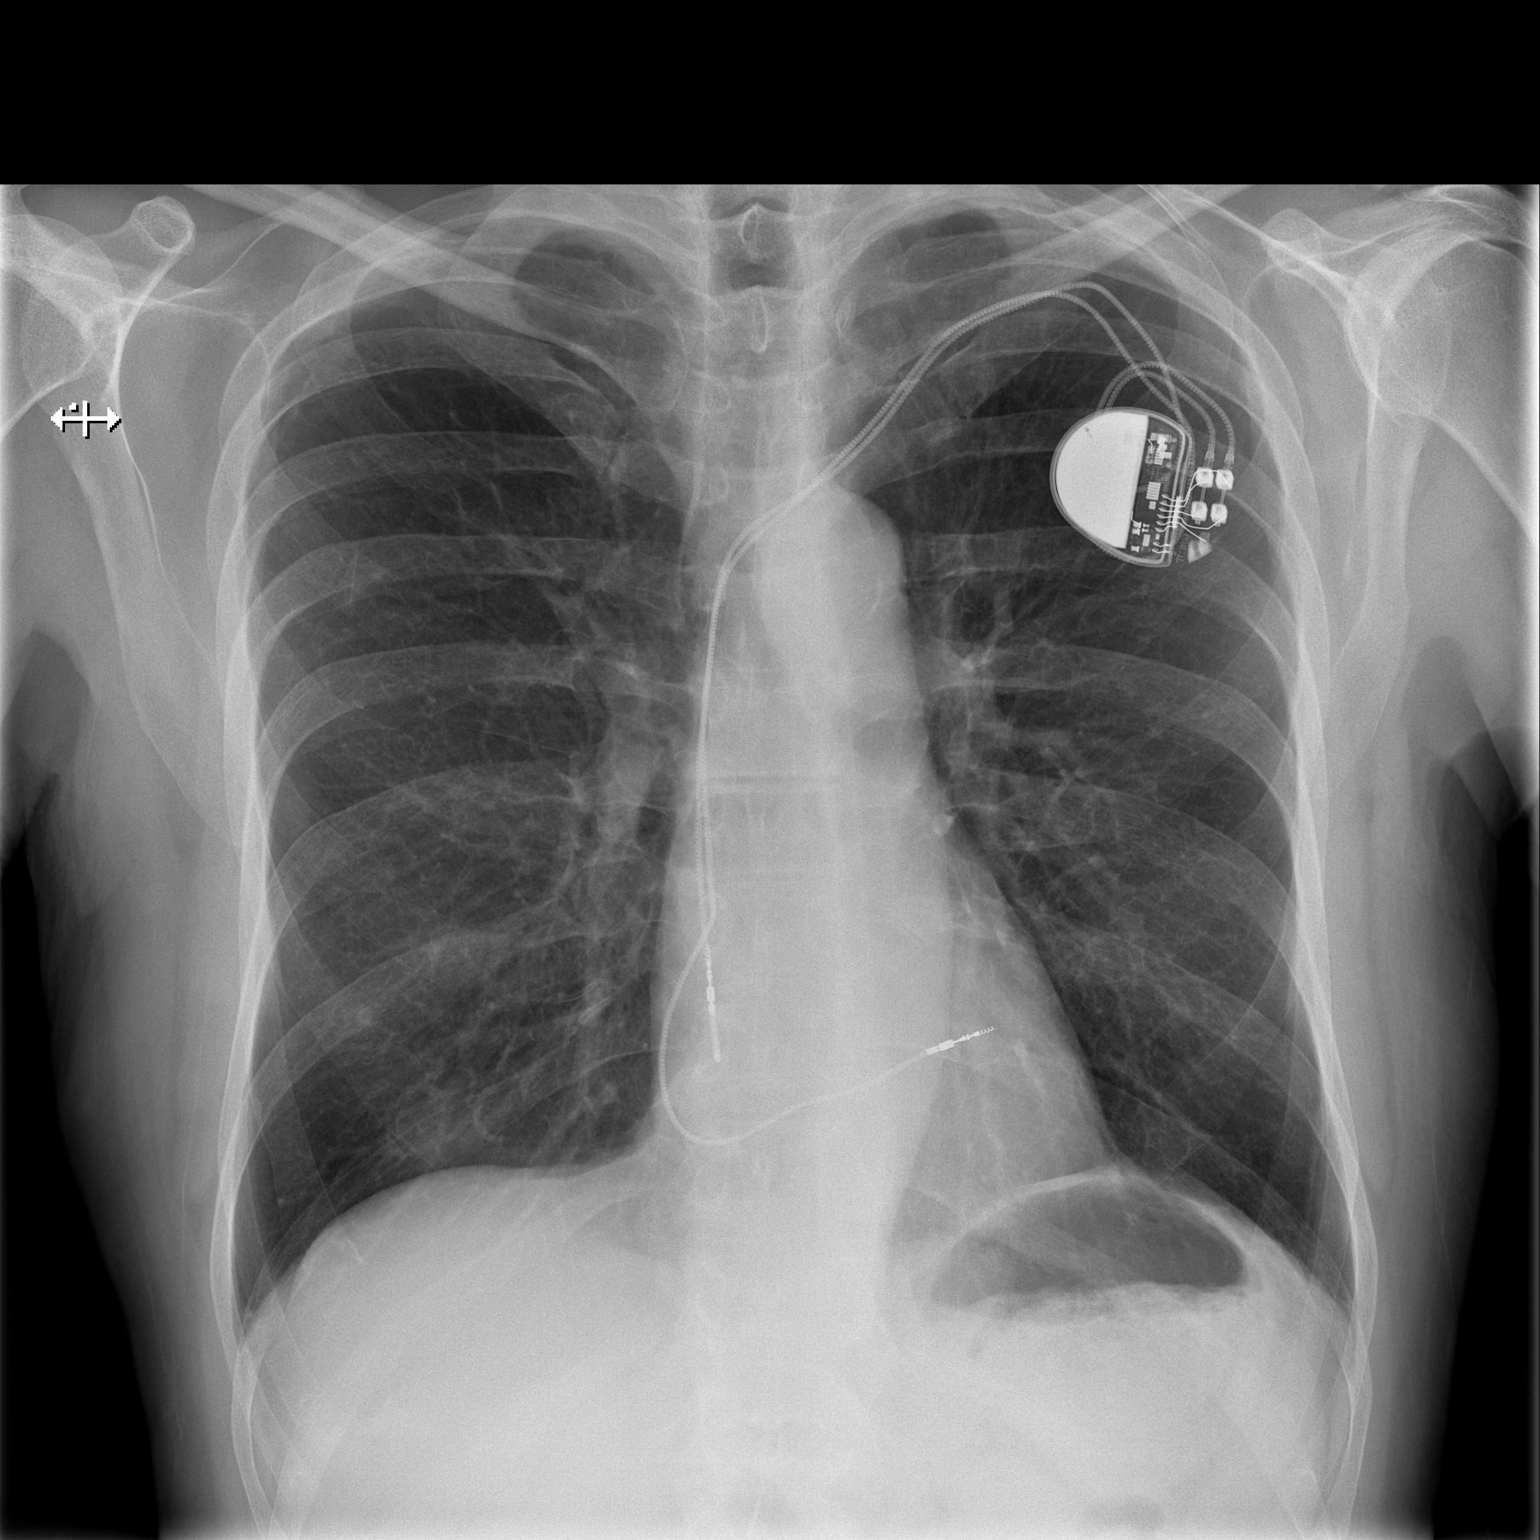

[w chest lat]
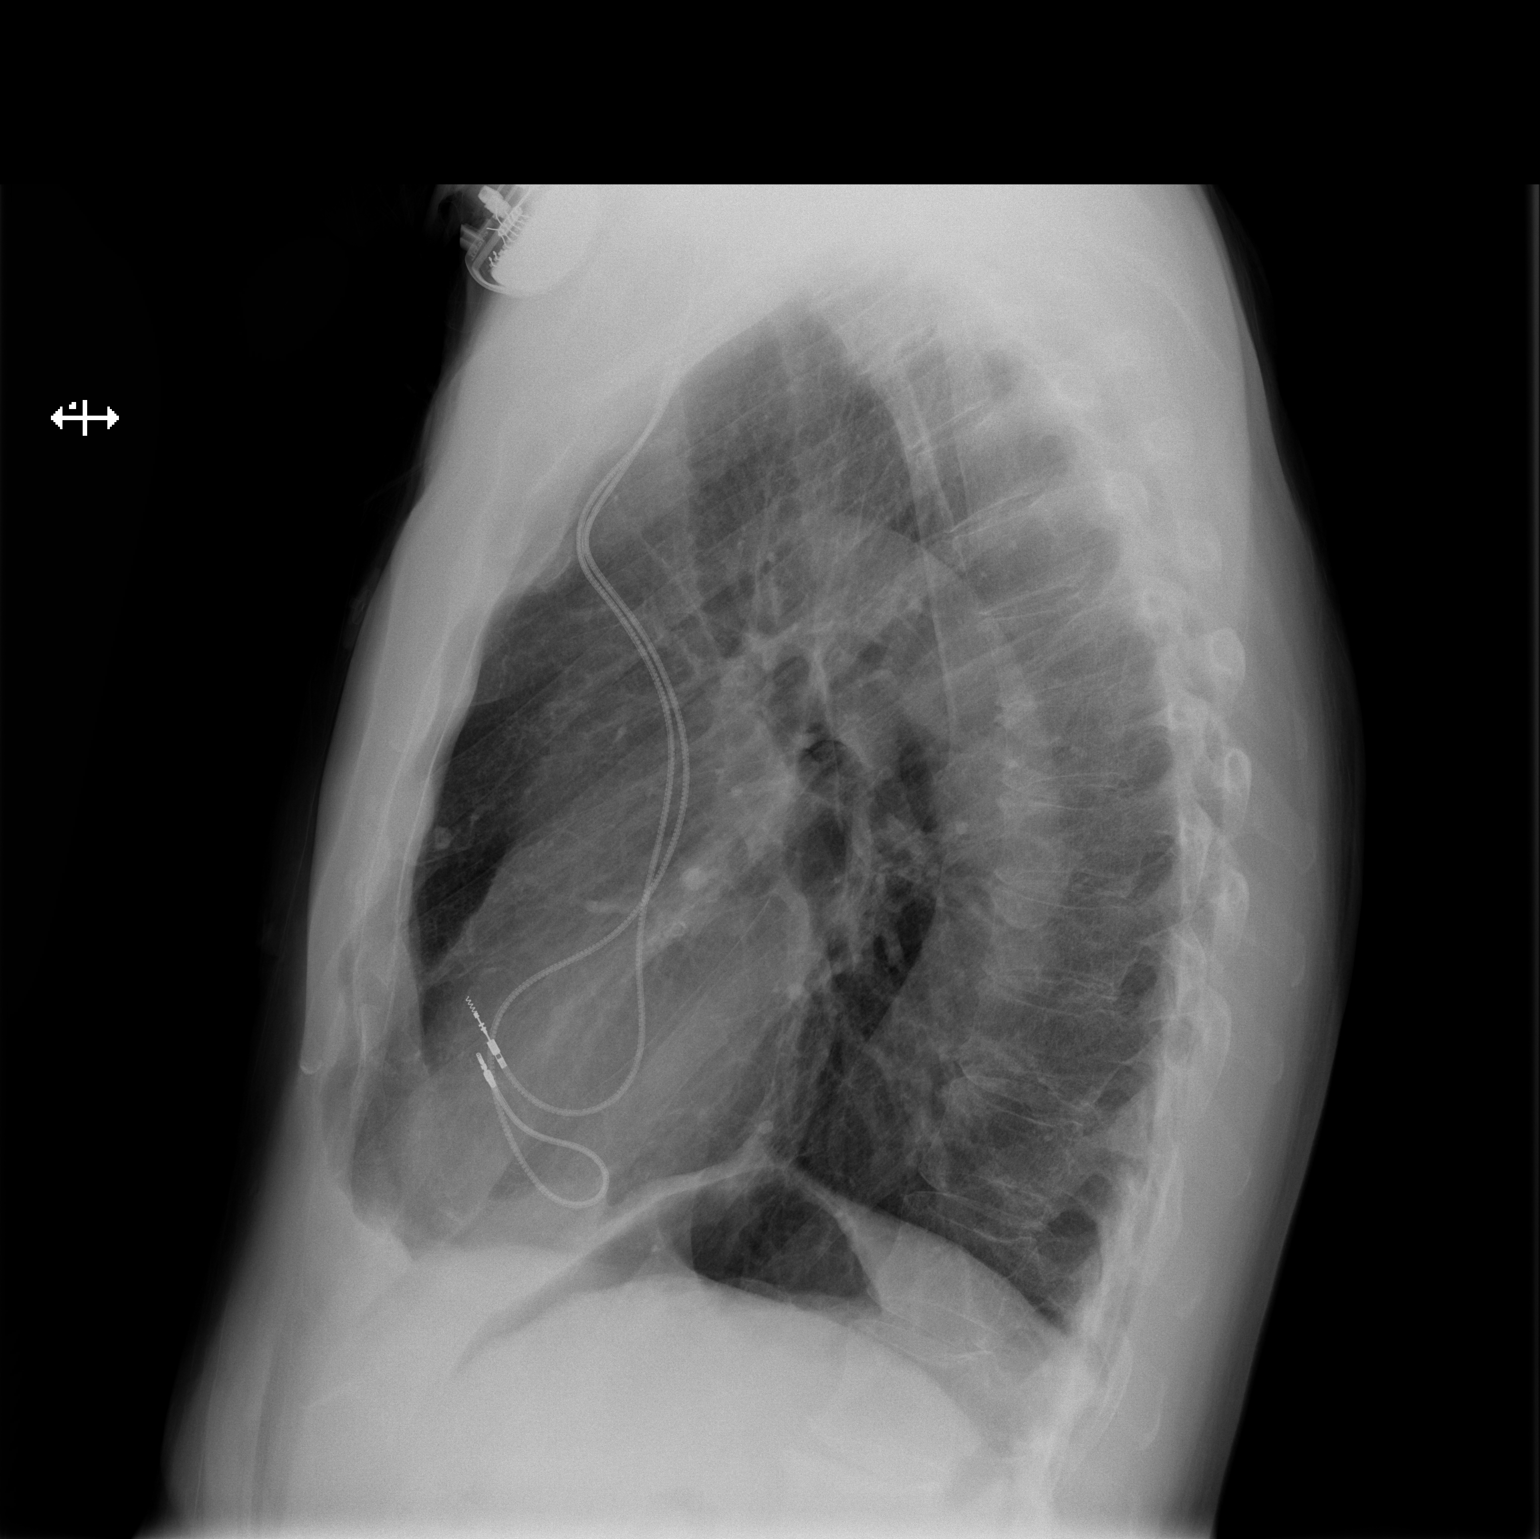

[2 of 2 positions shown; findings below may reference images not displayed]

FINDINGS: Mild osteopenia.Pacer with leads at right atrium and
right ventricle.  No lead discontinuity.  Remote distal left
clavicular trauma. Midline trachea.  Normal heart size and
mediastinal contours for age.  No pleural effusion or pneumothorax.
At least one right lung granuloma. No congestive failure.
IMPRESSION: No acute cardiopulmonary disease.

## 2012-12-04 ENCOUNTER — Telehealth: Payer: Self-pay | Admitting: Neurology

## 2012-12-06 MED ORDER — CEREFOLIN NAC 6-2-600 MG PO TABS: 1.0000 | ORAL_TABLET | Freq: Every day | ORAL | Status: DC

## 2012-12-06 NOTE — Telephone Encounter (Signed)
Rx sent 

## 2013-01-04 ENCOUNTER — Encounter: Payer: Self-pay | Admitting: Internal Medicine

## 2013-01-04 DIAGNOSIS — I498 Other specified cardiac arrhythmias: Secondary | ICD-10-CM

## 2013-01-06 ENCOUNTER — Ambulatory Visit: Payer: Medicare Other | Admitting: Podiatry

## 2013-01-13 ENCOUNTER — Ambulatory Visit (INDEPENDENT_AMBULATORY_CARE_PROVIDER_SITE_OTHER): Payer: Medicare Other | Admitting: Podiatry

## 2013-01-13 ENCOUNTER — Ambulatory Visit (INDEPENDENT_AMBULATORY_CARE_PROVIDER_SITE_OTHER): Payer: Medicare Other

## 2013-01-13 DIAGNOSIS — M2020 Hallux rigidus, unspecified foot: Secondary | ICD-10-CM

## 2013-01-13 DIAGNOSIS — M2022 Hallux rigidus, left foot: Secondary | ICD-10-CM

## 2013-01-13 NOTE — Progress Notes (Signed)
Patient ID: [name], male   DOB: 02/02/1931, 79 y.o.   MRN: [id number] Subjective: Patient presents for followup visit from 11/04/2012. At that time he was complaining of painful left hallux. He has multiple repetitive constant sharp pains up to 10 episodes a day at lasting up to a half hour. Diclofenac gel was prescribed on the initial visit however he noticed minimal relief.  Objective: Orientated x76 79 year old white male.  Vascular PTs are two over four bilaterally. Left DP is zero over four right is two over four. Arterial Doppler on 07/16/2012 demonstrated normal values  without evidence of arterial insufficiency in the lower extremity at rest.  Neurological: Patient has stable gait with hallux bilaterally in an hyperextended position.  Musculoskeletal: The left first MPJ is rigid at the metatarsophalangeal joint and the distal left hallux remains in the upward position. The right first MPJ has has mobility, however the right hallux is in hyperextended position.  X-ray report left foot demonstrates significant narrowing and arthrosis of the left first MPJ with the hallux in a hyperextended position.  Assessment: Hallux rigidus left first MPJ resulting in hyperextension of the left hallux  Plan: Patient advised that the deformity that creates the hyperextension is at the level of the first metatarsal phalangeal joint. He callosities had minimal relief from local care I am going to refer him to Dr. Ralene Cork for further evaluation and possible surgical treatment.

## 2013-01-29 ENCOUNTER — Ambulatory Visit: Payer: Medicare Other

## 2013-01-29 ENCOUNTER — Emergency Department (INDEPENDENT_AMBULATORY_CARE_PROVIDER_SITE_OTHER): Payer: Medicare Other

## 2013-01-29 ENCOUNTER — Ambulatory Visit (INDEPENDENT_AMBULATORY_CARE_PROVIDER_SITE_OTHER): Payer: Medicare Other

## 2013-01-29 ENCOUNTER — Encounter (HOSPITAL_COMMUNITY): Payer: Self-pay | Admitting: Emergency Medicine

## 2013-01-29 ENCOUNTER — Emergency Department (HOSPITAL_COMMUNITY)
Admission: EM | Admit: 2013-01-29 | Discharge: 2013-01-29 | Disposition: A | Discharge status: Home / Self Care | Payer: Medicare Other | Source: Home / Self Care | Attending: Family Medicine | Admitting: Family Medicine

## 2013-01-29 VITALS — BP 115/64 | HR 74 | Resp 16 | Ht 71.0 in | Wt 175.0 lb

## 2013-01-29 DIAGNOSIS — M79609 Pain in unspecified limb: Secondary | ICD-10-CM

## 2013-01-29 DIAGNOSIS — M25519 Pain in unspecified shoulder: Secondary | ICD-10-CM

## 2013-01-29 DIAGNOSIS — M549 Dorsalgia, unspecified: Secondary | ICD-10-CM

## 2013-01-29 DIAGNOSIS — M1990 Unspecified osteoarthritis, unspecified site: Secondary | ICD-10-CM

## 2013-01-29 DIAGNOSIS — M7750 Other enthesopathy of unspecified foot: Secondary | ICD-10-CM

## 2013-01-29 DIAGNOSIS — M2020 Hallux rigidus, unspecified foot: Secondary | ICD-10-CM

## 2013-01-29 MED ORDER — TRAMADOL HCL 50 MG PO TABS: 50.0000 mg | ORAL_TABLET | Freq: Four times a day (QID) | ORAL | Status: DC | PRN

## 2013-01-29 MED ORDER — CYCLOBENZAPRINE HCL 10 MG PO TABS: 10.0000 mg | ORAL_TABLET | Freq: Two times a day (BID) | ORAL | Status: DC | PRN

## 2013-01-29 NOTE — Patient Instructions (Signed)
Hallux Rigidus Hallux rigidus is a condition involving pain and a loss of motion of the first (big) toe. The pain gets worse with lifting up (extension) of the toe. This is usually due to arthritic bony bumps (spurring) of the joint at the base of the big toe.  SYMPTOMS   Pain, with lifting up of the toe.  Tenderness over the joint where the big toe meets the foot.  Redness, swelling, and warmth over the top of the base of the big toe (sometimes).  Foot pain, stiffness, and limping. CAUSES  Halllux rigidus is caused by arthritis of the joint where the big toe meets the foot. The arthritis creates a bone spur that pinches the soft tissues, when the toe is extended. RISK INCREASES WITH:  Tight shoes, with a narrow toe box.  Family history of foot problems.  Gout and rheumatoid and psoriatic arthritis.  History of previous toe injury, including "turf toe."  Long first toe, flat feet, and other big toe bony bumps.  Arthritis of the big toe. PREVENTION   Wear wide toed shoes that fit well.  Tape the big toe, to reduce motion and to prevent pinching of the tissues between the bone.  Maintain physical fitness:  Foot and ankle flexibility.  Muscle strength and endurance. PROGNOSIS  This condition can usually be managed with proper treatment. However, surgery is typically required to prevent the problem from recurring.  RELATED COMPLICATIONS  Injury to other areas of the foot or ankle, caused by abnormal walking in an attempt to avoid the pain felt when walking normally. TREATMENT Treatment first involves stopping the activities that aggravate your symptoms. Ice and medicine can be used to reduce the pain and inflammation. Modifications to shoes may help reduce pain, including wearing stiff-soled shoes, shoes with a wide toe box, inserting a padded donut to relieve pressure on top of the joint, or wearing an arch support. Corticosteroid injections may be given to reduce inflammation.  If non-surgical treatment is unsuccessful, surgery may be needed. Surgical options include removing the arthritic bony spur, cutting a bone in the foot to change the arc of motion (allowing the toe to extend more), or fusion of the joint (eliminating all motion in the joint at the base of the big toe).  MEDICATION   If pain medicine is needed, nonsteroidal anti-inflammatory medicines (aspirin and ibuprofen), or other minor pain relievers (acetaminophen), are often advised.  Do not take pain medicine for 7 days before surgery.  Prescription pain relievers are usually prescribed only after surgery. Use only as directed and only as much as you need.  Ointments for arthritis, applied to the skin, may give some relief.  Injections of corticosteroids may be given to reduce inflammation. HEAT AND COLD  Cold treatment (icing) relieves pain and reduces inflammation. Cold treatment should be applied for 10 to 15 minutes every 2 to 3 hours, and immediately after activity that aggravates your symptoms. Use ice packs or an ice massage.  Heat treatment may be used before performing the stretching and strengthening activities prescribed by your caregiver, physical therapist, or athletic trainer. Use a heat pack or a warm water soak. SEEK MEDICAL CARE IF:   Symptoms get worse or do not improve in 2 weeks, despite treatment.  After surgery you develop fever, increasing pain, redness, swelling, drainage of fluids, bleeding, or increasing warmth.  New, unexplained symptoms develop. (Drugs used in treatment may produce side effects.) Document Released: 12/31/2004 Document Revised: 03/25/2011 Document Reviewed: 04/14/2008 ExitCare Patient   Information 2014 [employer].  

## 2013-01-29 NOTE — Discharge Instructions (Signed)
   Shoulder Pain The shoulder is the joint that connects your arms to your body. The bones that form the shoulder joint include the upper arm bone (humerus), the shoulder blade (scapula), and the collarbone (clavicle). The top of the humerus is shaped like a ball and fits into a rather flat socket on the scapula (glenoid cavity). A combination of muscles and strong, fibrous tissues that connect muscles to bones (tendons) support your shoulder joint and hold the ball in the socket. Small, fluid-filled sacs (bursae) are located in different areas of the joint. They act as cushions between the bones and the overlying soft tissues and help reduce friction between the gliding tendons and the bone as you move your arm. Your shoulder joint allows a wide range of motion in your arm. This range of motion allows you to do things like scratch your back or throw a ball. However, this range of motion also makes your shoulder more prone to pain from overuse and injury. Causes of shoulder pain can originate from both injury and overuse and usually can be grouped in the following four categories:  Redness, swelling, and pain (inflammation) of the tendon (tendinitis) or the bursae (bursitis).  Instability, such as a dislocation of the joint.  Inflammation of the joint (arthritis).  Broken bone (fracture). HOME CARE INSTRUCTIONS   Apply ice to the sore area.  Put ice in a plastic bag.  Place a towel between your skin and the bag.  Leave the ice on for 15-20 minutes, 03-04 times per day for the first 2 days.  Stop using cold packs if they do not help with the pain.  If you have a shoulder sling or immobilizer, wear it as long as your caregiver instructs. Only remove it to shower or bathe. Move your arm as little as possible, but keep your hand moving to prevent swelling.  Squeeze a soft ball or foam pad as much as possible to help prevent swelling.  Only take over-the-counter or prescription medicines for  pain, discomfort, or fever as directed by your caregiver. SEEK MEDICAL CARE IF:   Your shoulder pain increases, or new pain develops in your arm, hand, or fingers.  Your hand or fingers become cold and numb.  Your pain is not relieved with medicines. SEEK IMMEDIATE MEDICAL CARE IF:   Your arm, hand, or fingers are numb or tingling.  Your arm, hand, or fingers are significantly swollen or turn white or blue. MAKE SURE YOU:   Understand these instructions.  Will watch your condition.  Will get help right away if you are not doing well or get worse. Document Released: 10/10/2004 Document Revised: 09/25/2011 Document Reviewed: 12/15/2010 ExitCare Patient Information 2014 [employer].  

## 2013-01-29 NOTE — ED Provider Notes (Signed)
CSN: [id number]     Arrival date & time 01/29/13  1332 History   First MD Initiated Contact with Patient 01/29/13 1526     Chief Complaint  Patient presents with  . Back Pain   (Consider location/radiation/quality/duration/timing/severity/associated sxs/prior Treatment) HPI Comments: 80 year old male with history of complete AV block, A. fib, sick sinus syndrome, mitral regurg, with pacemaker, presents complaining of left shoulder pain. Since he woke up this morning, he has a strange vague stabbing pain in his left shoulder. He has never experienced this before and he denies any known injury. This has been present since he woke up.  He has tried taking tylenol for this but it did not help.  No other associated symptoms, all other ROS negative.     Past Medical History  Diagnosis Date  . Bradycardia     s/p PPM  . Atrial flutter     s/p ablation  . Atrial fibrillation   . Prostate cancer 12-21-2003    12-7-seed implants done  . Hematoma   . Mitral regurgitation     mild echo 2012  . Pacemaker --[employer]   . Orthostatic lightheadedness   . Subdural hematoma    Past Surgical History  Procedure Laterality Date  . Tonsillectomy  as child  . Arthrscopic left knee surgery  02-11-2007  . Cardioversion  09-04-2006  . Av node ablation  04-06-2007  . Subdural hematoma evacuation via craniotomy  12-16-2007  . Rotator cuff repair w/ distal clavicle excision  08-31-2010    left  . Pacemaker insertion      [employer] Insignia   . Total knee arthroplasty  12/18/2010    Procedure: TOTAL KNEE ARTHROPLASTY;  Surgeon: Mauri Pole;  Location: [facility];  Service: Orthopedics;  Laterality: Left;  . Joint replacement    . Brain surgery    . Cardioversion    . Cataract extraction    . Left knee arthroscopy     Family History  Problem Relation Age of Onset  . Aneurysm Father   . Stroke Father    History  Substance Use Topics  . Smoking status: Never Smoker   . Smokeless tobacco: Never  Used  . Alcohol Use: 7.7 oz/week    7 Glasses of wine, 7 Drinks containing 0.5 oz of alcohol per week     Comment: 2 glasses wine day    Review of Systems  Constitutional: Negative for fever, chills and fatigue.  HENT: Negative for sore throat.   Eyes: Negative for visual disturbance.  Respiratory: Negative for cough and shortness of breath.   Cardiovascular: Negative for chest pain, palpitations and leg swelling.  Gastrointestinal: Negative for nausea, vomiting, abdominal pain, diarrhea and constipation.  Genitourinary: Negative for dysuria, urgency, frequency and hematuria.  Musculoskeletal: Positive for arthralgias and back pain. Negative for myalgias, neck pain and neck stiffness.  Skin: Negative for rash.  Neurological: Negative for dizziness, weakness and light-headedness.    Allergies  Review of patient's allergies indicates no known allergies.  Home Medications   Current Outpatient Rx  Name  Route  Sig  Dispense  Refill  . BOOSTRIX 5-2.5-18.5 injection               . Calcium Citrate-Vitamin D (CITRACAL + D PO)   Oral   Take by mouth.         . cyclobenzaprine (FLEXERIL) 10 MG tablet   Oral   Take 1 tablet (10 mg total) by mouth 2 (two) times daily as  needed for muscle spasms.   20 tablet   0   . Glucosamine-Chondroitin (GLUCOSAMINE CHONDR COMPLEX PO)   Oral   Take by mouth.         Javier Docker Oil 500 MG CAPS   Oral   Take by mouth.         . levothyroxine (SYNTHROID, LEVOTHROID) 150 MCG tablet   Oral   Take 137 mcg by mouth daily.          . Methylfol-Methylcob-Acetylcyst (CEREFOLIN NAC) 6-2-600 MG TABS   Oral   Take 1 tablet by mouth daily.   90 each   2   . Multiple Vitamin (MULTIVITAMIN) tablet   Oral   Take 1 tablet by mouth daily.           . propranolol ER (INDERAL LA) 60 MG 24 hr capsule   Oral   Take 1 capsule (60 mg total) by mouth daily.   90 capsule   3   . Rivaroxaban (XARELTO) 20 MG TABS   Oral   Take 1 tablet (20 mg  total) by mouth daily.   90 tablet   3   . solifenacin (VESICARE) 5 MG tablet   Oral   Take 10 mg by mouth daily.         . traMADol (ULTRAM) 50 MG tablet   Oral   Take 1 tablet (50 mg total) by mouth every 6 (six) hours as needed.   30 tablet   0   . zolpidem (AMBIEN) 10 MG tablet                BP 142/75  Pulse 63  Temp(Src) 97.3 F (36.3 C) (Oral)  Resp 20  SpO2 100% Physical Exam  Nursing note and vitals reviewed. Constitutional: He is oriented to person, place, and time. He appears well-developed and well-nourished. No distress.  HENT:  Head: Normocephalic.  Pulmonary/Chest: Effort normal. No respiratory distress.  Musculoskeletal:       Cervical back: He exhibits no tenderness.  Left scapular winging   Neurological: He is alert and oriented to person, place, and time. Coordination normal.  Skin: Skin is warm and dry. No rash noted. He is not diaphoretic.  Psychiatric: He has a normal mood and affect. Judgment normal.    ED Course  Procedures (including critical care time) Labs Review Labs Reviewed - No data to display Imaging Review Dg Ribs Unilateral W/chest Left  01/29/2013   CLINICAL DATA:  Pain.  EXAM: LEFT RIBS AND CHEST - 3+ VIEW  COMPARISON:  None.  FINDINGS: No focal bony abnormality. No evidence of fracture. Small left base infiltrate versus atelectasis. No pneumothorax. Apical pleural thickening on the left consistent with scarring. This is stable from 06/27/2011. Cardiac pacer noted with lead tips in right atrium and right ventricle. Degenerative changes thoracic spine.  IMPRESSION: Mild left lower lobe pneumonia and/or atelectasis. No focal bony abnormality.   Electronically Signed   By: Marcello Moores  Register   On: 01/29/2013 16:28      MDM   1. Shoulder pain   2. Back pain    Pt seen in conjunction with attending, scapular winging is mild and probably insignificant.  EKG unchanged, non-ischemic.  CXR read is PNA or atelectasis, does not fit  with clinical picture, will monitor for any respiratory symptoms.  This is most likely MSK pain, treat symptomatically, f/u if worsening or if new/concerning symptoms arise.     Meds ordered this encounter  Medications  .  traMADol (ULTRAM) 50 MG tablet    Sig: Take 1 tablet (50 mg total) by mouth every 6 (six) hours as needed.    Dispense:  30 tablet    Refill:  0    Order Specific Question:  Supervising Provider    Answer:  Lynne Leader, [place]  . cyclobenzaprine (FLEXERIL) 10 MG tablet    Sig: Take 1 tablet (10 mg total) by mouth 2 (two) times daily as needed for muscle spasms.    Dispense:  20 tablet    Refill:  0    Order Specific Question:  Supervising Provider    Answer:  Lynne Leader, [place]       Liam Graham, PA-C 01/29/13 1654

## 2013-01-29 NOTE — Progress Notes (Signed)
   Subjective:    Patient ID: [name], male    DOB: 31-Aug-1931, 80 y.o.   MRN: [id number] Pt presents for follow up of left hallux, pt states the Voltaren gel 1% did not help, is currently using Tylenol. HPI    Review of Systems no new changes or findings.     Objective:   Physical Exam Vascular status appears to be intact with pedal pulses palpable DP postal for PT plus one over 4 bilateral Refill time 3 seconds all digits. Epicritic and proprioceptive sensations intact and symmetric bilateral. There is normal plantar response DTRs not listed. Clinically there is some limitation of motion great toe joint left more so than right with the toe being in the hallux extensors position has restricted range of motion dorsiflexion plantar flexion with pain and crepitus on attempted range of motion of the left great toe joint. Right toe has some mild arthrosis although not near as severe or symptomatic. Patient been using topical indications (gel etc. it was temporary relief at best maintaining good [place] walking or athletic shoe at all times. 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 My recommendation would be for conservative care stiff soled shoe patient is been using Tylenol as needed for pain topical NSAIDs the other alternative at this time be surgical intervention in the form of the implant arthroplasty [name] with Silastic implant is reviewed with the patient. Also the option of the first MTP fusion was also discussed however I feel patient would benefit from an implant arthroplasty to maintain motion and activities. Literature is dispensed be discussed options risks complications patient will discuss with family in reappointed to me for possible surgical consult  and scheduling at some point in the future  [name] DPM

## 2013-01-29 NOTE — ED Notes (Signed)
C/o upper left back pain that was noticed this morning, onset this morning.  Pain is described as sharp, and has improved slightly.  Also described as "uncomfortable" .  Patient reports twisting of torso makes sharp pain spike.  No known injury.

## 2013-02-02 NOTE — ED Provider Notes (Signed)
Medical screening examination/treatment/procedure(s) were performed by a resident physician or non-physician practitioner and as the supervising physician I was immediately available for consultation/collaboration.  Valen Mascaro, MD    Brianni Manthe S Lennix Rotundo, MD 02/02/13 0744 

## 2013-02-08 ENCOUNTER — Other Ambulatory Visit: Payer: Self-pay | Admitting: Internal Medicine

## 2013-03-18 ENCOUNTER — Other Ambulatory Visit: Payer: Self-pay | Admitting: Internal Medicine

## 2013-03-18 ENCOUNTER — Ambulatory Visit
Admission: RE | Admit: 2013-03-18 | Discharge: 2013-03-18 | Disposition: A | Discharge status: Home / Self Care | Payer: Medicare Other | Source: Ambulatory Visit | Attending: Internal Medicine | Admitting: Internal Medicine

## 2013-03-18 DIAGNOSIS — R059 Cough, unspecified: Secondary | ICD-10-CM

## 2013-03-18 DIAGNOSIS — R05 Cough: Secondary | ICD-10-CM

## 2013-04-14 ENCOUNTER — Ambulatory Visit (INDEPENDENT_AMBULATORY_CARE_PROVIDER_SITE_OTHER): Payer: Medicare Other | Admitting: Internal Medicine

## 2013-04-14 ENCOUNTER — Encounter: Payer: Self-pay | Admitting: Internal Medicine

## 2013-04-14 VITALS — BP 127/81 | HR 85 | Ht 72.0 in | Wt 180.0 lb

## 2013-04-14 DIAGNOSIS — I4891 Unspecified atrial fibrillation: Secondary | ICD-10-CM

## 2013-04-14 DIAGNOSIS — I442 Atrioventricular block, complete: Secondary | ICD-10-CM

## 2013-04-14 DIAGNOSIS — I495 Sick sinus syndrome: Secondary | ICD-10-CM

## 2013-04-14 DIAGNOSIS — Z950 Presence of cardiac pacemaker: Secondary | ICD-10-CM

## 2013-04-14 LAB — MDC_IDC_ENUM_SESS_TYPE_INCLINIC
Brady Statistic AP VP Percent: 26 %
Brady Statistic AP VS Percent: 1 %
Brady Statistic AS VP Percent: 23 %
Implantable Pulse Generator Model: 1297
Implantable Pulse Generator Serial Number: 319544
Lead Channel Impedance Value: 360 Ohm
Lead Channel Pacing Threshold Amplitude: 0.7 V
Lead Channel Pacing Threshold Pulse Width: 0.4 ms
Lead Channel Setting Pacing Amplitude: 2 V
MDC IDC MSMT LEADCHNL RA SENSING INTR AMPL: 1.1 mV
MDC IDC MSMT LEADCHNL RV IMPEDANCE VALUE: 420 Ohm
MDC IDC MSMT LEADCHNL RV SENSING INTR AMPL: 8.2 mV
MDC IDC SET LEADCHNL RV PACING AMPLITUDE: 2.4 V
MDC IDC SET LEADCHNL RV PACING PULSEWIDTH: 0.4 ms
MDC IDC STAT BRADY AS VS PERCENT: 50 %

## 2013-04-14 NOTE — Progress Notes (Signed)
Patient Care Team: Lajean Manes, MD as PCP - General (Internal Medicine)   HPI  [name] is a 80 y.o. male Seen in followup for atrial fibrillation for which he was placed on flecainide. He was seen a few weeks ago and was found to have a broad complex rhythm that was concerning for flecainide toxicity. There was an atrial tachycardia that Dr. Lovena Le successfully pace terminated.  It was elected to continue his flecainide. The device was reprogrammed to detect slower arrhythmias. The flecainide dose was decreased from 100-50 twice a day  He has no awareness of intercurrent atrial fibrillation. He notes that fatigue is chronic but no different.  He is taking Rivaroxaban   Past Medical History  Diagnosis Date  . Bradycardia     s/p PPM  . Atrial flutter     s/p ablation  . Atrial fibrillation   . Prostate cancer 12-21-2003    12-7-seed implants done  . Hematoma   . Mitral regurgitation     mild echo 2012  . Pacemaker --[employer]   . Orthostatic lightheadedness   . Subdural hematoma     Past Surgical History  Procedure Laterality Date  . Tonsillectomy  as child  . Arthrscopic left knee surgery  02-11-2007  . Cardioversion  09-04-2006  . Av node ablation  04-06-2007  . Subdural hematoma evacuation via craniotomy  12-16-2007  . Rotator cuff repair w/ distal clavicle excision  08-31-2010    left  . Pacemaker insertion      [employer] Insignia   . Total knee arthroplasty  12/18/2010    Procedure: TOTAL KNEE ARTHROPLASTY;  Surgeon: Mauri Pole;  Location: [facility];  Service: Orthopedics;  Laterality: Left;  . Joint replacement    . Brain surgery    . Cardioversion    . Cataract extraction    . Left knee arthroscopy      Current Outpatient Prescriptions  Medication Sig Dispense Refill  . BOOSTRIX 5-2.5-18.5 injection       . Calcium Citrate-Vitamin D (CITRACAL + D PO) Take by mouth.      . Glucosamine-Chondroitin (GLUCOSAMINE CHONDR COMPLEX PO) Take by  mouth.      Javier Docker Oil 500 MG CAPS Take by mouth.      . levothyroxine (SYNTHROID, LEVOTHROID) 150 MCG tablet Take 137 mcg by mouth daily.       . Methylfol-Methylcob-Acetylcyst (CEREFOLIN NAC) 6-2-600 MG TABS Take 1 tablet by mouth daily.  90 each  2  . Multiple Vitamin (MULTIVITAMIN) tablet Take 1 tablet by mouth daily.        . propranolol ER (INDERAL LA) 60 MG 24 hr capsule TAKE 1 CAPSULE (60 MG TOTAL) BY MOUTH DAILY.  90 capsule  0  . Rivaroxaban (XARELTO) 20 MG TABS Take 1 tablet (20 mg total) by mouth daily.  90 tablet  3  . zolpidem (AMBIEN) 10 MG tablet        No current facility-administered medications for this visit.    No Known Allergies  Review of Systems negative except from HPI and PMH  Physical Exam BP 127/81  Pulse 85  Ht 6' (1.829 m)  Wt 180 lb (81.647 kg)  BMI 24.41 kg/m2 Well developed and nourished in no acute distress HENT normal Neck supple with JVP-flat Carotids brisk and full without bruits Clear Irregularly irregular rate and rhythm with controlled* ventricular response, no murmurs or gallops Abd-soft with active BS without hepatomegaly No Clubbing cyanosis edema  Skin-warm and dry A & Oriented  Grossly normal sensory and motor function     Assessment and  Plan  Atrial fibrillation  Permanent  Continue meds  Pacemaker The patient's device was interrogated.  The information was reviewed. No changes were made in the programming.

## 2013-04-14 NOTE — Patient Instructions (Signed)

## 2013-04-15 ENCOUNTER — Encounter: Payer: Self-pay | Admitting: Internal Medicine

## 2013-04-22 ENCOUNTER — Encounter: Payer: Self-pay | Admitting: Internal Medicine

## 2013-05-07 DIAGNOSIS — I4892 Unspecified atrial flutter: Secondary | ICD-10-CM

## 2013-05-10 ENCOUNTER — Other Ambulatory Visit: Payer: Self-pay | Admitting: Internal Medicine

## 2013-05-28 ENCOUNTER — Encounter (HOSPITAL_COMMUNITY): Payer: Self-pay | Admitting: Emergency Medicine

## 2013-05-28 ENCOUNTER — Other Ambulatory Visit: Payer: Self-pay | Admitting: Geriatric Medicine

## 2013-05-28 ENCOUNTER — Inpatient Hospital Stay (HOSPITAL_COMMUNITY)
Admission: EM | Admit: 2013-05-28 | Discharge: 2013-06-04 | DRG: 715 | Disposition: A | Discharge status: Home / Self Care | Payer: Medicare Other | Attending: Internal Medicine | Admitting: Internal Medicine

## 2013-05-28 DIAGNOSIS — R053 Chronic cough: Secondary | ICD-10-CM

## 2013-05-28 DIAGNOSIS — E875 Hyperkalemia: Secondary | ICD-10-CM | POA: Diagnosis present

## 2013-05-28 DIAGNOSIS — K5900 Constipation, unspecified: Secondary | ICD-10-CM | POA: Diagnosis not present

## 2013-05-28 DIAGNOSIS — I4891 Unspecified atrial fibrillation: Secondary | ICD-10-CM | POA: Diagnosis present

## 2013-05-28 DIAGNOSIS — D638 Anemia in other chronic diseases classified elsewhere: Secondary | ICD-10-CM | POA: Diagnosis present

## 2013-05-28 DIAGNOSIS — N1330 Unspecified hydronephrosis: Secondary | ICD-10-CM | POA: Diagnosis present

## 2013-05-28 DIAGNOSIS — I4892 Unspecified atrial flutter: Secondary | ICD-10-CM | POA: Diagnosis present

## 2013-05-28 DIAGNOSIS — N3289 Other specified disorders of bladder: Secondary | ICD-10-CM | POA: Diagnosis present

## 2013-05-28 DIAGNOSIS — R300 Dysuria: Secondary | ICD-10-CM | POA: Diagnosis not present

## 2013-05-28 DIAGNOSIS — N3941 Urge incontinence: Secondary | ICD-10-CM | POA: Diagnosis present

## 2013-05-28 DIAGNOSIS — R05 Cough: Secondary | ICD-10-CM

## 2013-05-28 DIAGNOSIS — I442 Atrioventricular block, complete: Secondary | ICD-10-CM

## 2013-05-28 DIAGNOSIS — Z7901 Long term (current) use of anticoagulants: Secondary | ICD-10-CM

## 2013-05-28 DIAGNOSIS — R350 Frequency of micturition: Secondary | ICD-10-CM | POA: Diagnosis not present

## 2013-05-28 DIAGNOSIS — R059 Cough, unspecified: Secondary | ICD-10-CM

## 2013-05-28 DIAGNOSIS — I495 Sick sinus syndrome: Secondary | ICD-10-CM

## 2013-05-28 DIAGNOSIS — R42 Dizziness and giddiness: Secondary | ICD-10-CM

## 2013-05-28 DIAGNOSIS — E876 Hypokalemia: Secondary | ICD-10-CM | POA: Diagnosis not present

## 2013-05-28 DIAGNOSIS — Z950 Presence of cardiac pacemaker: Secondary | ICD-10-CM | POA: Diagnosis present

## 2013-05-28 DIAGNOSIS — C61 Malignant neoplasm of prostate: Principal | ICD-10-CM | POA: Diagnosis present

## 2013-05-28 DIAGNOSIS — I340 Nonrheumatic mitral (valve) insufficiency: Secondary | ICD-10-CM

## 2013-05-28 DIAGNOSIS — I498 Other specified cardiac arrhythmias: Secondary | ICD-10-CM | POA: Diagnosis present

## 2013-05-28 DIAGNOSIS — R5383 Other fatigue: Secondary | ICD-10-CM

## 2013-05-28 DIAGNOSIS — N179 Acute kidney failure, unspecified: Secondary | ICD-10-CM | POA: Diagnosis present

## 2013-05-28 DIAGNOSIS — Z96659 Presence of unspecified artificial knee joint: Secondary | ICD-10-CM

## 2013-05-28 DIAGNOSIS — J4489 Other specified chronic obstructive pulmonary disease: Secondary | ICD-10-CM | POA: Diagnosis present

## 2013-05-28 DIAGNOSIS — J449 Chronic obstructive pulmonary disease, unspecified: Secondary | ICD-10-CM | POA: Diagnosis present

## 2013-05-28 DIAGNOSIS — D649 Anemia, unspecified: Secondary | ICD-10-CM | POA: Diagnosis present

## 2013-05-28 DIAGNOSIS — R3915 Urgency of urination: Secondary | ICD-10-CM | POA: Diagnosis not present

## 2013-05-28 DIAGNOSIS — Z823 Family history of stroke: Secondary | ICD-10-CM

## 2013-05-28 DIAGNOSIS — I471 Supraventricular tachycardia: Secondary | ICD-10-CM

## 2013-05-28 LAB — COMPREHENSIVE METABOLIC PANEL
ALBUMIN: 4 g/dL (ref 3.5–5.2)
ALK PHOS: 69 U/L (ref 39–117)
ALT: 10 U/L (ref 0–53)
AST: 17 U/L (ref 0–37)
BILIRUBIN TOTAL: 0.4 mg/dL (ref 0.3–1.2)
BUN: 68 mg/dL — ABNORMAL HIGH (ref 6–23)
CHLORIDE: 103 meq/L (ref 96–112)
CO2: 21 mEq/L (ref 19–32)
Calcium: 10 mg/dL (ref 8.4–10.5)
Creatinine, Ser: 4.48 mg/dL — ABNORMAL HIGH (ref 0.50–1.35)
GFR calc Af Amer: 13 mL/min — ABNORMAL LOW (ref >90)
GFR, EST NON AFRICAN AMERICAN: 11 mL/min — AB (ref >90)
Glucose, Bld: 107 mg/dL — ABNORMAL HIGH (ref 70–99)
POTASSIUM: 6 meq/L — AB (ref 3.7–5.3)
Sodium: 140 mEq/L (ref 137–147)
Total Protein: 7.3 g/dL (ref 6.0–8.3)

## 2013-05-28 LAB — CBC WITH DIFFERENTIAL/PLATELET
BASOS PCT: 0 % (ref 0–1)
Basophils Absolute: 0 10*3/uL (ref 0.0–0.1)
Eosinophils Absolute: 0.1 10*3/uL (ref 0.0–0.7)
Eosinophils Relative: 2 % (ref 0–5)
HCT: 28.6 % — ABNORMAL LOW (ref 39.0–52.0)
HEMOGLOBIN: 9.7 g/dL — AB (ref 13.0–17.0)
LYMPHS ABS: 1.2 10*3/uL (ref 0.7–4.0)
Lymphocytes Relative: 20 % (ref 12–46)
MCH: 33.4 pg (ref 26.0–34.0)
MCHC: 33.9 g/dL (ref 30.0–36.0)
MCV: 98.6 fL (ref 78.0–100.0)
Monocytes Absolute: 0.3 10*3/uL (ref 0.1–1.0)
Monocytes Relative: 6 % (ref 3–12)
NEUTROS PCT: 72 % (ref 43–77)
Neutro Abs: 4.2 10*3/uL (ref 1.7–7.7)
Platelets: 181 10*3/uL (ref 150–400)
RBC: 2.9 MIL/uL — ABNORMAL LOW (ref 4.22–5.81)
RDW: 13 % (ref 11.5–15.5)
WBC: 5.9 10*3/uL (ref 4.0–10.5)

## 2013-05-28 MED ORDER — SODIUM CHLORIDE 0.9 % IV BOLUS (SEPSIS)
1000.0000 mL | Freq: Once | INTRAVENOUS | Status: AC
  Administered 2013-05-28: 1000 mL via INTRAVENOUS

## 2013-05-28 MED ORDER — SODIUM POLYSTYRENE SULFONATE 15 GM/60ML PO SUSP
15.0000 g | Freq: Once | ORAL | Status: AC
  Administered 2013-05-28: 15 g via ORAL
  Filled 2013-05-28: qty 60

## 2013-05-28 NOTE — H&P (Signed)
PCP:  [name], MD  Urology  Cardiology: Caryl Comes  Chief Complaint:  Was told his labs were off  HPI: [name] is a 80 y.o. male   has a past medical history of Bradycardia; Atrial flutter; Atrial fibrillation; Prostate cancer (12-21-2003); Hematoma; Mitral regurgitation; Pacemaker --[employer]; Orthostatic lightheadedness; Subdural hematoma; Anemia due to GI blood loss; and Prostate cancer.   Presented with  Patient had routine lab work done that showed acute renal failure. No hx of this in the past. He noted decreased urine output for some time. Urologist have told him few days ago that he was retaining his urine. Last week he was started on Flomax in hopes to help with retention. Yesterday he has wet is bed. Denies any nausea or vomiting no diarrhea.  Bladder scan in ER showed 300 ml of urine. Patient reports hxof prostate CA diagnosed and treated 5 years ago.  Hospitalist was called for admission for ARF. He reports a bit of dry cough.   Review of Systems:    Pertinent positives include: urinary retention.  non-productive cough,   Constitutional:  No weight loss, night sweats, Fevers, chills, fatigue, weight loss  HEENT:  No headaches, Difficulty swallowing,Tooth/dental problems,Sore throat,  No sneezing, itching, ear ache, nasal congestion, post nasal drip,  Cardio-vascular:  No chest pain, Orthopnea, PND, anasarca, dizziness, palpitations.no Bilateral lower extremity swelling  GI:  No heartburn, indigestion, abdominal pain, nausea, vomiting, diarrhea, change in bowel habits, loss of appetite, melena, blood in stool, hematemesis Resp:  no shortness of breath at rest. No dyspnea on exertion, No excess mucus, no productive cough, No No coughing up of blood.No change in color of mucus.No wheezing. Skin:  no rash or lesions. No jaundice GU:  no dysuria, change in color of urine, no urgency or frequency. No straining to urinate.  No flank pain.    Musculoskeletal:  No joint pain or no joint swelling. No decreased range of motion. No back pain.  Psych:  No change in mood or affect. No depression or anxiety. No memory loss.  Neuro: no localizing neurological complaints, no tingling, no weakness, no double vision, no gait abnormality, no slurred speech, no confusion  Otherwise ROS are negative except for above, 10 systems were reviewed  Past Medical History: Past Medical History  Diagnosis Date  . Bradycardia     s/p PPM  . Atrial flutter     s/p ablation  . Atrial fibrillation   . Prostate cancer 12-21-2003    12-7-seed implants done  . Hematoma   . Mitral regurgitation     mild echo 2012  . Pacemaker --[employer]   . Orthostatic lightheadedness   . Subdural hematoma    Past Surgical History  Procedure Laterality Date  . Tonsillectomy  as child  . Arthrscopic left knee surgery  02-11-2007  . Cardioversion  09-04-2006  . Av node ablation  04-06-2007  . Subdural hematoma evacuation via craniotomy  12-16-2007  . Rotator cuff repair w/ distal clavicle excision  08-31-2010    left  . Pacemaker insertion      [employer]ignia   . Total knee arthroplasty  12/18/2010    Procedure: TOTAL KNEE ARTHROPLASTY;  Surgeon: Mauri Pole;  Location: [facility];  Service: Orthopedics;  Laterality: Left;  . Joint replacement    . Brain surgery    . Cardioversion    . Cataract extraction    . Left knee arthroscopy       Medications: Prior  to Admission medications   Medication Sig Start Date End Date Taking? Authorizing Provider  acetaminophen (TYLENOL) 500 MG tablet Take 500 mg by mouth every 6 (six) hours as needed for moderate pain.   Yes Historical Provider, MD  calcium citrate-vitamin D (CITRACAL+D) 315-200 MG-UNIT per tablet Take 1 tablet by mouth 2 (two) times daily.   Yes Historical Provider, MD  Carboxymethylcellul-Glycerin (LUBRICATING EYE DROPS) 0.5-0.9 % SOLN Apply 1 drop to eye daily as needed.   Yes Historical  Provider, MD  Glucosamine-Chondroit-Vit C-Mn (GLUCOSAMINE 1500 COMPLEX) CAPS Take 1 capsule by mouth 2 (two) times daily.   Yes Historical Provider, MD  guaiFENesin (ROBITUSSIN) 100 MG/5ML liquid Take 200 mg by mouth 2 (two) times daily as needed for cough or congestion.   Yes Historical Provider, MD  levothyroxine (SYNTHROID, LEVOTHROID) 137 MCG tablet Take 137 mcg by mouth daily before breakfast.   Yes Historical Provider, MD  Melatonin 3 MG TABS Take 1 tablet by mouth at bedtime.   Yes Historical Provider, MD  Methylfol-Methylcob-Acetylcyst (CEREFOLIN NAC) 6-2-600 MG TABS Take 1 tablet by mouth daily.   Yes Historical Provider, MD  Multiple Vitamin (MULTIVITAMIN WITH MINERALS) TABS tablet Take 1 tablet by mouth daily.   Yes Historical Provider, MD  Omega-3 Fatty Acids (FISH OIL) 500 MG CAPS Take 500 mg by mouth 2 (two) times daily.   Yes Historical Provider, MD  propranolol ER (INDERAL LA) 60 MG 24 hr capsule Take 60 mg by mouth daily.   Yes Historical Provider, MD  rivaroxaban (XARELTO) 20 MG TABS tablet Take 20 mg by mouth daily.    Yes Historical Provider, MD  solifenacin (VESICARE) 10 MG tablet Take 10 mg by mouth daily.   Yes Historical Provider, MD  tamsulosin (FLOMAX) 0.4 MG CAPS capsule Take 0.4 mg by mouth daily.   Yes Historical Provider, MD    Allergies:  No Known Allergies  Social History:  Ambulatory  Independently  Lives at home   With family     reports that he has never smoked. He has never used smokeless tobacco. He reports that he drinks about 7.7 ounces of alcohol per week. He reports that he does not use illicit drugs.    Family History: family history includes Aneurysm in his father; Stroke in his father.    Physical Exam: Patient Vitals for the past 24 hrs:  BP Temp Temp src Pulse Resp SpO2 Height Weight  05/28/13 2315 158/99 mmHg - - 118 21 99 % - -  05/28/13 2300 149/92 mmHg - - 90 20 95 % - -  05/28/13 2230 154/88 mmHg - - 46 20 96 % - -  05/28/13 2215  156/87 mmHg - - - 22 - - -  05/28/13 2145 151/104 mmHg - - 42 21 100 % - -  05/28/13 1942 - 97.5 F (36.4 C) Oral - - - - -  05/28/13 1939 116/91 mmHg - Oral 60 20 99 % 6' (1.829 m) 79.379 kg (175 lb)    1. General:  in No Acute distress 2. Psychological: Alert and   Oriented 3. Head/ENT:   Moist  Mucous Membranes                          Head Non traumatic, neck supple                          Normal  Dentition 4. SKIN: normal  Skin turgor,  Skin clean Dry and intact no rash 5. Heart: Irregular rate and rhythm no Murmur, Rub or gallop 6. Lungs: no wheezes occasional crackles   7. Abdomen: Soft, non-tender, Non distended 8. Lower extremities: no clubbing, cyanosis, or edema 9. Neurologically Grossly intact, moving all 4 extremities equally 10. MSK: Normal range of motion  body mass index is 23.73 kg/(m^2).   Labs on Admission:   Recent Labs  05/28/13 1946  NA 140  K 6.0*  CL 103  CO2 21  GLUCOSE 107*  BUN 68*  CREATININE 4.48*  CALCIUM 10.0    Recent Labs  05/28/13 1946  AST 17  ALT 10  ALKPHOS 69  BILITOT 0.4  PROT 7.3  ALBUMIN 4.0   No results found for this basename: LIPASE, AMYLASE,  in the last 72 hours  Recent Labs  05/28/13 1946  WBC 5.9  NEUTROABS 4.2  HGB 9.7*  HCT 28.6*  MCV 98.6  PLT 181   No results found for this basename: CKTOTAL, CKMB, CKMBINDEX, TROPONINI,  in the last 72 hours No results found for this basename: TSH, T4TOTAL, FREET3, T3FREE, THYROIDAB,  in the last 72 hours No results found for this basename: VITAMINB12, FOLATE, FERRITIN, TIBC, IRON, RETICCTPCT,  in the last 72 hours No results found for this basename: HGBA1C    Estimated Creatinine Clearance: 14.7 ml/min (by C-G formula based on Cr of 4.48). ABG    Component Value Date/Time   PHART 7.393 12/16/2007 0941   HCO3 23.2 12/16/2007 0941   TCO2 24 12/16/2007 0941   ACIDBASEDEF 2.0 12/16/2007 0941   O2SAT 100.0 12/16/2007 0941     No results found for this basename:  DDIMER     Other results:  I have pearsonaly reviewed this: ECG REPORT  Rate:83  Rhythm: A.fib with RBBB some PVC ST&T Change: no ischemia   BNP (last 3 results) No results found for this basename: PROBNP,  in the last 8760 hours  Filed Weights   05/28/13 1939  Weight: 79.379 kg (175 lb)     Cultures: No results found for this basename: sdes, specrequest, cult, reptstatus      Radiological Exams on Admission: No results found.  Chart has been reviewed  Assessment/Plan  80 year old gentleman with history of atrial fibrillation on xarelto with recent history of urinary retention presents with acute renal failure with improved urinary output after Foley placement  Present on Admission:  . Acute renal failure - likely post obstructive, will obtain renal ultrasound, obtain urine electrolytes urinary output improved after Foley placement. Continue Flomax for now in a.m. please get urology consult. If no improvement of acute renal failure would benefit from renal consult  . ATRIAL FIBRILLATION-paroxysmal - currently rate controlled  acute renal failure not a candidate for xarelto, will hold and watch renal function. If recovers it could be restarted later is not patient need to have an alternative anticoagulation . Anemia - patient has remote history of GI bleeding nothing recent. Obtain Hemoccult stool and anemia panel  . Hyperkalemia - Has been treated with Taxol 8 insulin glucose and albuterol held calcium his calcium level was 10   Will repeat serial labs.   cough will obtain chest x-ray given bilateral crackles  Prophylaxis: SCDpatient likely still therapeutic on xarelto   CODE STATUS:  FULL CODE   Other plan as per orders.  I have spent a total of 55 min on this admission  Abdi Husak 05/28/2013, 11:57 PM

## 2013-05-28 NOTE — ED Notes (Signed)
Pt ambulated to the bathroom.  

## 2013-05-28 NOTE — ED Provider Notes (Signed)
CSN: [id number]     Arrival date & time 05/28/13  1936 History   First MD Initiated Contact with Patient 05/28/13 2156     No chief complaint on file.    (Consider location/radiation/quality/duration/timing/severity/associated sxs/prior Treatment) The history is provided by the patient, medical records and the spouse. No language interpreter was used.    [name] is a 80 y.o. male  with a hx of s-fib (with pacemaker), prostate cancer (tx with seeds), anemia (unknown origin) presents to the Emergency Department presents after his MD reported that hs had an abnormal lab value.  Pt reports he has had chronic cough that the doctor has been managing and he is waiting for a CT scan on Tuesday.  He denies new medication changes, dysuria, hematuria, flank pain, abd pain.  Aggravating or alleviating factors. Patient denies chest pain, shortness of breath, fever, chills. Pt relates a hx of urinary retention, but reports that he has been urinating some today.    Past Medical History  Diagnosis Date  . Bradycardia     s/p PPM  . Atrial flutter     s/p ablation  . Atrial fibrillation   . Prostate cancer 12-21-2003    12-7-seed implants done  . Hematoma   . Mitral regurgitation     mild echo 2012  . Pacemaker --[employer]   . Orthostatic lightheadedness   . Subdural hematoma    Past Surgical History  Procedure Laterality Date  . Tonsillectomy  as child  . Arthrscopic left knee surgery  02-11-2007  . Cardioversion  09-04-2006  . Av node ablation  04-06-2007  . Subdural hematoma evacuation via craniotomy  12-16-2007  . Rotator cuff repair w/ distal clavicle excision  08-31-2010    left  . Pacemaker insertion      [employer] Insignia   . Total knee arthroplasty  12/18/2010    Procedure: TOTAL KNEE ARTHROPLASTY;  Surgeon: Mauri Pole;  Location: [facility];  Service: Orthopedics;  Laterality: Left;  . Joint replacement    . Brain surgery    . Cardioversion    . Cataract extraction    .  Left knee arthroscopy     Family History  Problem Relation Age of Onset  . Aneurysm Father   . Stroke Father    History  Substance Use Topics  . Smoking status: Never Smoker   . Smokeless tobacco: Never Used  . Alcohol Use: 7.7 oz/week    7 Glasses of wine, 7 Drinks containing 0.5 oz of alcohol per week     Comment: 2 glasses wine day    Review of Systems  Constitutional: Negative for fever, diaphoresis, appetite change, fatigue and unexpected weight change.  HENT: Negative for mouth sores.   Eyes: Negative for visual disturbance.  Respiratory: Positive for cough (chronic). Negative for chest tightness, shortness of breath and wheezing.   Cardiovascular: Negative for chest pain.  Gastrointestinal: Negative for nausea, vomiting, abdominal pain, diarrhea and constipation.  Endocrine: Negative for polydipsia, polyphagia and polyuria.  Genitourinary: Negative for dysuria, urgency, frequency and hematuria.  Musculoskeletal: Negative for back pain and neck stiffness.  Skin: Negative for rash.  Allergic/Immunologic: Negative for immunocompromised state.  Neurological: Negative for syncope, light-headedness and headaches.  Hematological: Does not bruise/bleed easily.  Psychiatric/Behavioral: Negative for sleep disturbance. The patient is not nervous/anxious.       Allergies  Review of patient's allergies indicates no known allergies.  Home Medications   Prior to Admission medications   Medication Sig  Start Date End Date Taking? Authorizing Provider  acetaminophen (TYLENOL) 500 MG tablet Take 500 mg by mouth every 6 (six) hours as needed for moderate pain.   Yes Historical Provider, MD  calcium citrate-vitamin D (CITRACAL+D) 315-200 MG-UNIT per tablet Take 1 tablet by mouth 2 (two) times daily.   Yes Historical Provider, MD  Carboxymethylcellul-Glycerin (LUBRICATING EYE DROPS) 0.5-0.9 % SOLN Apply 1 drop to eye daily as needed.   Yes Historical Provider, MD   Glucosamine-Chondroit-Vit C-Mn (GLUCOSAMINE 1500 COMPLEX) CAPS Take 1 capsule by mouth 2 (two) times daily.   Yes Historical Provider, MD  guaiFENesin (ROBITUSSIN) 100 MG/5ML liquid Take 200 mg by mouth 2 (two) times daily as needed for cough or congestion.   Yes Historical Provider, MD  levothyroxine (SYNTHROID, LEVOTHROID) 137 MCG tablet Take 137 mcg by mouth daily before breakfast.   Yes Historical Provider, MD  Melatonin 3 MG TABS Take 1 tablet by mouth at bedtime.   Yes Historical Provider, MD  Methylfol-Methylcob-Acetylcyst (CEREFOLIN NAC) 6-2-600 MG TABS Take 1 tablet by mouth daily.   Yes Historical Provider, MD  Multiple Vitamin (MULTIVITAMIN WITH MINERALS) TABS tablet Take 1 tablet by mouth daily.   Yes Historical Provider, MD  Omega-3 Fatty Acids (FISH OIL) 500 MG CAPS Take 500 mg by mouth 2 (two) times daily.   Yes Historical Provider, MD  propranolol ER (INDERAL LA) 60 MG 24 hr capsule Take 60 mg by mouth daily.   Yes Historical Provider, MD  rivaroxaban (XARELTO) 20 MG TABS tablet Take 20 mg by mouth daily.    Yes Historical Provider, MD  solifenacin (VESICARE) 10 MG tablet Take 10 mg by mouth daily.   Yes Historical Provider, MD  tamsulosin (FLOMAX) 0.4 MG CAPS capsule Take 0.4 mg by mouth daily.   Yes Historical Provider, MD   BP 149/92  Pulse 90  Temp(Src) 97.5 F (36.4 C) (Oral)  Resp 20  Ht 6' (1.829 m)  Wt 175 lb (79.379 kg)  BMI 23.73 kg/m2  SpO2 95% Physical Exam  Nursing note and vitals reviewed. Constitutional: He is oriented to person, place, and time. He appears well-developed and well-nourished. No distress.  Awake, alert, nontoxic appearance  HENT:  Head: Normocephalic and atraumatic.  Right Ear: Tympanic membrane, external ear and ear canal normal.  Left Ear: Tympanic membrane, external ear and ear canal normal.  Nose: Nose normal. No epistaxis. Right sinus exhibits no maxillary sinus tenderness and no frontal sinus tenderness. Left sinus exhibits no  maxillary sinus tenderness and no frontal sinus tenderness.  Mouth/Throat: Uvula is midline, oropharynx is clear and moist and mucous membranes are normal. Mucous membranes are not pale and not cyanotic. No oropharyngeal exudate, posterior oropharyngeal edema, posterior oropharyngeal erythema or tonsillar abscesses.  Eyes: Conjunctivae are normal. Pupils are equal, round, and reactive to light. No scleral icterus.  Neck: Normal range of motion and full passive range of motion without pain. Neck supple.  Cardiovascular: Normal rate, regular rhythm and intact distal pulses.   Irregularly irregular rhythm  Pulmonary/Chest: Effort normal and breath sounds normal. No stridor. No respiratory distress. He has no wheezes.  Urine equal breath sounds Patient coughing  Abdominal: Soft. Bowel sounds are normal. He exhibits no mass. There is no tenderness. There is no rebound and no guarding.  Abdomen soft and nontender No CVA tenderness  Musculoskeletal: Normal range of motion. He exhibits no edema.  No peripheral edema  Lymphadenopathy:    He has no cervical adenopathy.  Neurological: He is alert and  oriented to person, place, and time. He exhibits normal muscle tone. Coordination normal.  Speech is clear and goal oriented Moves extremities without ataxia  Skin: Skin is warm and dry. No rash noted. He is not diaphoretic. No erythema.  No rash  Psychiatric: He has a normal mood and affect.    ED Course  Procedures (including critical care time) Labs Review Labs Reviewed  CBC WITH DIFFERENTIAL - Abnormal; Notable for the following:    RBC 2.90 (*)    Hemoglobin 9.7 (*)    HCT 28.6 (*)    All other components within normal limits  COMPREHENSIVE METABOLIC PANEL - Abnormal; Notable for the following:    Potassium 6.0 (*)    Glucose, Bld 107 (*)    BUN 68 (*)    Creatinine, Ser 4.48 (*)    GFR calc non Af Amer 11 (*)    GFR calc Af Amer 13 (*)    All other components within normal limits     Imaging Review No results found.   EKG Interpretation None      ECG:  Date: 05/28/2013  Rate: 83  Rhythm: atrial fibrillation  QRS Axis: normal  Intervals: n/a  ST/T Wave abnormalities: indeterminate  Conduction Disutrbances:right bundle branch block  Narrative Interpretation: a-fib with demand pacing, unchanged from prior  Old EKG Reviewed: unchanged    MDM   Final diagnoses:  Hyperkalemia  Acute renal failure  Atrial fibrillation  Chronic cough  Anemia  Pacemaker-dual-chamber-[employer]   [name] presents after routine followup with his primary care physician L. neck with acute renal failure. No known cause of his renal failure. Question dehydration versus urinary retention vs possible medication change. Patient denies medication change however pharmacy review shows that patient has been taking cerefolin strength 6/1/5/2 which was recently changed to a strength of 6/2/600 and contains acetylcysteine.  Patient denies nausea vomiting or other dehydration events. He has a history of A. fib and is anticoagulated with Xarelto.    Bladder scan with 342mL. Foley with 361mL output.  ECG with A. fib and demand pacing; unchanged from last.  He is given fluids and Kayexalate. Will consult hospitalist for admission.  BP 149/92  Pulse 90  Temp(Src) 97.5 F (36.4 C) (Oral)  Resp 20  Ht 6' (1.829 m)  Wt 175 lb (79.379 kg)  BMI 23.73 kg/m2  SpO2 95%  The patient was discussed with and seen by Dr. Eulis Foster who agrees with the treatment plan.   Jarrett Soho Dereonna Lensing, PA-C 05/29/13 [phone]

## 2013-05-28 NOTE — ED Provider Notes (Signed)
  Face-to-face evaluation   History: He presents for evaluation of elevated creatinine. His doctor has been following him for a chest problem and check routine labs today. He, states he was also told recently that his bladder was "half full." Today he also had urinary urge incontinence, and urgency. He denies back, pain or stool incontinence.   Physical exam: Alert, elderly man in mild discomfort. Heart regular rate and rhythm. No murmur. Lungs clear to auscultation. Abdomen soft, nontender. He has a sensation of pressure with palpation, suprapubic region, but there is no palpable mass in this area.   Urinary Bladder scan- 300 cc  Medical screening examination/treatment/procedure(s) were conducted as a shared visit with non-physician practitioner(s) and myself.  I personally evaluated the patient during the encounter  Richarda Blade, MD 05/29/13 [phone]

## 2013-05-28 NOTE — ED Notes (Signed)
Admitting at bedside 

## 2013-05-28 NOTE — ED Notes (Addendum)
Pt. received a call from Dr. Carlyle Lipa clinic advising him to go to ER due to abnormal blood tests results done today at [facility] . Pt. unable to recall the abnormal results . Denies pain or discomfort / respirations unlabored .

## 2013-05-28 NOTE — ED Notes (Signed)
Bedside commode set up at bedside.

## 2013-05-29 ENCOUNTER — Inpatient Hospital Stay (HOSPITAL_COMMUNITY): Payer: Medicare Other

## 2013-05-29 ENCOUNTER — Encounter (HOSPITAL_COMMUNITY): Payer: Self-pay | Admitting: *Deleted

## 2013-05-29 DIAGNOSIS — E875 Hyperkalemia: Secondary | ICD-10-CM

## 2013-05-29 DIAGNOSIS — D649 Anemia, unspecified: Secondary | ICD-10-CM

## 2013-05-29 DIAGNOSIS — R059 Cough, unspecified: Secondary | ICD-10-CM

## 2013-05-29 DIAGNOSIS — N179 Acute kidney failure, unspecified: Secondary | ICD-10-CM | POA: Diagnosis present

## 2013-05-29 DIAGNOSIS — R05 Cough: Secondary | ICD-10-CM

## 2013-05-29 DIAGNOSIS — I4891 Unspecified atrial fibrillation: Secondary | ICD-10-CM

## 2013-05-29 LAB — BASIC METABOLIC PANEL
BUN: 68 mg/dL — ABNORMAL HIGH (ref 6–23)
BUN: 63 mg/dL — ABNORMAL HIGH (ref 6–23)
BUN: 62 mg/dL — ABNORMAL HIGH (ref 6–23)
CHLORIDE: 107 meq/L (ref 96–112)
CO2: 19 meq/L (ref 19–32)
CO2: 18 meq/L — AB (ref 19–32)
CO2: 19 mEq/L (ref 19–32)
Calcium: 9.2 mg/dL (ref 8.4–10.5)
Calcium: 8.4 mg/dL (ref 8.4–10.5)
Calcium: 8.8 mg/dL (ref 8.4–10.5)
Chloride: 102 mEq/L (ref 96–112)
Chloride: 105 mEq/L (ref 96–112)
Creatinine, Ser: 4.53 mg/dL — ABNORMAL HIGH (ref 0.50–1.35)
Creatinine, Ser: 4.56 mg/dL — ABNORMAL HIGH (ref 0.50–1.35)
Creatinine, Ser: 4.7 mg/dL — ABNORMAL HIGH (ref 0.50–1.35)
GFR calc Af Amer: 13 mL/min — ABNORMAL LOW (ref >90)
GFR calc Af Amer: 13 mL/min — ABNORMAL LOW (ref >90)
GFR calc non Af Amer: 11 mL/min — ABNORMAL LOW (ref >90)
GFR, EST AFRICAN AMERICAN: 12 mL/min — AB (ref >90)
GFR, EST NON AFRICAN AMERICAN: 11 mL/min — AB (ref >90)
GFR, EST NON AFRICAN AMERICAN: 11 mL/min — AB (ref >90)
GLUCOSE: 108 mg/dL — AB (ref 70–99)
GLUCOSE: 116 mg/dL — AB (ref 70–99)
Glucose, Bld: 78 mg/dL (ref 70–99)
POTASSIUM: 4.4 meq/L (ref 3.7–5.3)
Potassium: 5 mEq/L (ref 3.7–5.3)
Potassium: 4.4 mEq/L (ref 3.7–5.3)
SODIUM: 140 meq/L (ref 137–147)
SODIUM: 135 meq/L — AB (ref 137–147)
SODIUM: 141 meq/L (ref 137–147)

## 2013-05-29 LAB — COMPREHENSIVE METABOLIC PANEL
ALBUMIN: 3.5 g/dL (ref 3.5–5.2)
ALT: 10 U/L (ref 0–53)
AST: 15 U/L (ref 0–37)
Alkaline Phosphatase: 61 U/L (ref 39–117)
BUN: 65 mg/dL — ABNORMAL HIGH (ref 6–23)
CALCIUM: 9 mg/dL (ref 8.4–10.5)
CO2: 17 meq/L — AB (ref 19–32)
CREATININE: 4.46 mg/dL — AB (ref 0.50–1.35)
Chloride: 105 mEq/L (ref 96–112)
GFR calc Af Amer: 13 mL/min — ABNORMAL LOW (ref >90)
GFR, EST NON AFRICAN AMERICAN: 11 mL/min — AB (ref >90)
Glucose, Bld: 137 mg/dL — ABNORMAL HIGH (ref 70–99)
Potassium: 4.7 mEq/L (ref 3.7–5.3)
SODIUM: 141 meq/L (ref 137–147)
Total Bilirubin: 0.3 mg/dL (ref 0.3–1.2)
Total Protein: 6.6 g/dL (ref 6.0–8.3)

## 2013-05-29 LAB — CBC
HEMATOCRIT: 25.8 % — AB (ref 39.0–52.0)
HEMOGLOBIN: 8.7 g/dL — AB (ref 13.0–17.0)
MCH: 33.1 pg (ref 26.0–34.0)
MCHC: 33.7 g/dL (ref 30.0–36.0)
MCV: 98.1 fL (ref 78.0–100.0)
Platelets: 144 10*3/uL — ABNORMAL LOW (ref 150–400)
RBC: 2.63 MIL/uL — AB (ref 4.22–5.81)
RDW: 13.2 % (ref 11.5–15.5)
WBC: 6.5 10*3/uL (ref 4.0–10.5)

## 2013-05-29 LAB — URINALYSIS, ROUTINE W REFLEX MICROSCOPIC
BILIRUBIN URINE: NEGATIVE
Glucose, UA: NEGATIVE mg/dL
HGB URINE DIPSTICK: NEGATIVE
Ketones, ur: NEGATIVE mg/dL
Leukocytes, UA: NEGATIVE
NITRITE: NEGATIVE
PH: 5 (ref 5.0–8.0)
Protein, ur: NEGATIVE mg/dL
Specific Gravity, Urine: 1.014 (ref 1.005–1.030)
Urobilinogen, UA: 0.2 mg/dL (ref 0.0–1.0)

## 2013-05-29 LAB — IRON AND TIBC
IRON: 52 ug/dL (ref 42–135)
SATURATION RATIOS: 20 % (ref 20–55)
TIBC: 262 ug/dL (ref 215–435)
UIBC: 210 ug/dL (ref 125–400)

## 2013-05-29 LAB — FERRITIN: Ferritin: 127 ng/mL (ref 22–322)

## 2013-05-29 LAB — FOLATE: Folate: >20 ng/mL

## 2013-05-29 LAB — GLUCOSE, CAPILLARY
GLUCOSE-CAPILLARY: 47 mg/dL — AB (ref 70–99)
Glucose-Capillary: 100 mg/dL — ABNORMAL HIGH (ref 70–99)

## 2013-05-29 LAB — RETICULOCYTES
RBC.: 2.57 MIL/uL — ABNORMAL LOW (ref 4.22–5.81)
Retic Count, Absolute: 23.1 10*3/uL (ref 19.0–186.0)
Retic Ct Pct: 0.9 % (ref 0.4–3.1)

## 2013-05-29 LAB — TSH: TSH: 0.675 u[IU]/mL (ref 0.350–4.500)

## 2013-05-29 LAB — MAGNESIUM: Magnesium: 1.6 mg/dL (ref 1.5–2.5)

## 2013-05-29 LAB — SODIUM, URINE, RANDOM: Sodium, Ur: 54 mEq/L

## 2013-05-29 LAB — GLUCOSE, RANDOM: Glucose, Bld: 130 mg/dL — ABNORMAL HIGH (ref 70–99)

## 2013-05-29 LAB — PHOSPHORUS: PHOSPHORUS: 4.2 mg/dL (ref 2.3–4.6)

## 2013-05-29 LAB — VITAMIN B12: VITAMIN B 12: 972 pg/mL — AB (ref 211–911)

## 2013-05-29 LAB — CREATININE, URINE, RANDOM: CREATININE, URINE: 50.41 mg/dL

## 2013-05-29 MED ORDER — ACETAMINOPHEN 650 MG RE SUPP
650.0000 mg | Freq: Four times a day (QID) | RECTAL | Status: DC | PRN
  Filled 2013-05-29: qty 1

## 2013-05-29 MED ORDER — GUAIFENESIN 100 MG/5ML PO SOLN
200.0000 mg | Freq: Two times a day (BID) | ORAL | Status: DC | PRN
  Administered 2013-05-29 (×2): 200 mg via ORAL
  Filled 2013-05-29 (×6): qty 10

## 2013-05-29 MED ORDER — PROPRANOLOL HCL ER 60 MG PO CP24
60.0000 mg | ORAL_CAPSULE | Freq: Every day | ORAL | Status: DC
  Administered 2013-05-29 – 2013-06-04 (×6): 60 mg via ORAL
  Filled 2013-05-29 (×7): qty 1

## 2013-05-29 MED ORDER — DEXTROSE 50 % IV SOLN
1.0000 | Freq: Once | INTRAVENOUS | Status: AC
Start: 2013-05-29 — End: 2013-05-29
  Administered 2013-05-29: 50 mL via INTRAVENOUS
  Filled 2013-05-29: qty 50

## 2013-05-29 MED ORDER — DOCUSATE SODIUM 100 MG PO CAPS
100.0000 mg | ORAL_CAPSULE | Freq: Two times a day (BID) | ORAL | Status: DC
Start: 2013-05-29 — End: 2013-06-04
  Administered 2013-05-31 – 2013-06-03 (×7): 100 mg via ORAL
  Filled 2013-05-29 (×14): qty 1

## 2013-05-29 MED ORDER — ONDANSETRON HCL 4 MG/2ML IJ SOLN
4.0000 mg | Freq: Four times a day (QID) | INTRAMUSCULAR | Status: DC | PRN
  Filled 2013-05-29: qty 2

## 2013-05-29 MED ORDER — HYDROCODONE-ACETAMINOPHEN 5-325 MG PO TABS
1.0000 | ORAL_TABLET | ORAL | Status: DC | PRN
  Administered 2013-05-30: 1 via ORAL
  Administered 2013-06-01 – 2013-06-03 (×2): 2 via ORAL
  Administered 2013-06-04: 1 via ORAL
  Filled 2013-05-29: qty 2
  Filled 2013-05-29: qty 1
  Filled 2013-05-29: qty 2
  Filled 2013-05-29: qty 1
  Filled 2013-05-29: qty 2

## 2013-05-29 MED ORDER — DARIFENACIN HYDROBROMIDE ER 15 MG PO TB24
15.0000 mg | ORAL_TABLET | Freq: Every day | ORAL | Status: DC
  Administered 2013-05-29 – 2013-06-04 (×6): 15 mg via ORAL
  Filled 2013-05-29 (×7): qty 1

## 2013-05-29 MED ORDER — INSULIN ASPART 100 UNIT/ML IV SOLN
10.0000 [IU] | Freq: Once | INTRAVENOUS | Status: AC
Start: 2013-05-29 — End: 2013-05-29
  Administered 2013-05-29: 10 [IU] via INTRAVENOUS

## 2013-05-29 MED ORDER — SODIUM CHLORIDE 0.9 % IJ SOLN
3.0000 mL | Freq: Two times a day (BID) | INTRAMUSCULAR | Status: DC
  Administered 2013-05-29 – 2013-06-02 (×7): 3 mL via INTRAVENOUS
  Filled 2013-05-29: qty 3

## 2013-05-29 MED ORDER — ACETAMINOPHEN 325 MG PO TABS
650.0000 mg | ORAL_TABLET | Freq: Four times a day (QID) | ORAL | Status: DC | PRN
  Administered 2013-05-29 – 2013-05-30 (×3): 650 mg via ORAL
  Filled 2013-05-29 (×4): qty 2

## 2013-05-29 MED ORDER — DEXTROSE 10 % IV SOLN: Freq: Once | INTRAVENOUS | Status: DC

## 2013-05-29 MED ORDER — SODIUM POLYSTYRENE SULFONATE 15 GM/60ML PO SUSP
30.0000 g | Freq: Once | ORAL | Status: AC
  Administered 2013-05-29: 30 g via ORAL
  Filled 2013-05-29 (×2): qty 120

## 2013-05-29 MED ORDER — LEVOTHYROXINE SODIUM 137 MCG PO TABS
137.0000 ug | ORAL_TABLET | Freq: Every day | ORAL | Status: DC
  Administered 2013-05-29 – 2013-06-04 (×7): 137 ug via ORAL
  Filled 2013-05-29 (×9): qty 1

## 2013-05-29 MED ORDER — ONDANSETRON HCL 4 MG PO TABS
4.0000 mg | ORAL_TABLET | Freq: Four times a day (QID) | ORAL | Status: DC | PRN
  Filled 2013-05-29: qty 1

## 2013-05-29 MED ORDER — ALBUTEROL SULFATE (2.5 MG/3ML) 0.083% IN NEBU
5.0000 mg | INHALATION_SOLUTION | Freq: Once | RESPIRATORY_TRACT | Status: AC
  Administered 2013-05-29: 5 mg via RESPIRATORY_TRACT
  Filled 2013-05-29: qty 6

## 2013-05-29 MED ORDER — MOMETASONE FURO-FORMOTEROL FUM 100-5 MCG/ACT IN AERO
2.0000 | INHALATION_SPRAY | Freq: Two times a day (BID) | RESPIRATORY_TRACT | Status: DC
  Administered 2013-05-30 – 2013-06-04 (×10): 2 via RESPIRATORY_TRACT
  Filled 2013-05-29 (×2): qty 8.8

## 2013-05-29 MED ORDER — SODIUM CHLORIDE 0.9 % IV SOLN
INTRAVENOUS | Status: AC
  Administered 2013-05-29: 03:00:00 via INTRAVENOUS

## 2013-05-29 MED ORDER — TAMSULOSIN HCL 0.4 MG PO CAPS
0.4000 mg | ORAL_CAPSULE | Freq: Every day | ORAL | Status: DC
  Administered 2013-05-29 – 2013-06-04 (×6): 0.4 mg via ORAL
  Filled 2013-05-29 (×7): qty 1

## 2013-05-29 NOTE — Progress Notes (Signed)
Pt's wife and daughter in room wanting to speak with MD; Dr. Humphrey Rolls paged at this time to make aware; will await callback.

## 2013-05-29 NOTE — Consult Note (Signed)
Reason for Consult: Acute Renal Failure with Hydronephrosis, Prostate Cancer  Referring Physician: Laurelyn Sickle MD  [name] is an 80 y.o. male.   HPI:   1 - Acute Renal Failure With Hydronephrosis  - Baseline Cr < 1.3 2014, noted acute rise >4 with hyperkalemia to 6.0, now improved. Renal US 5/16 with bilateral hydro to bladder that is non-distended. Has had foley for >24 hrs without  significant improvement in Cr. CT 5/16 with hydro down to very thickened bladder neck worrisome for possible neoplasm.  2 - Prostate Cancer - s/p primary brachytherapy 2005 for Gleason 6 disease with excellent biochemical control until 2014 when slow rise in PSA, now most recently 2.89 04/2013.   Today Alonzo is seen in consultation for above. He has been on Xarelto for A-Fib in past, but none in 3 days.   Past Medical History  Diagnosis Date  . Bradycardia     s/p PPM  . Atrial flutter     s/p ablation  . Atrial fibrillation   . Prostate cancer 12-21-2003    12-7-seed implants done  . Hematoma   . Mitral regurgitation     mild echo 2012  . Pacemaker --[employer]   . Orthostatic lightheadedness   . Subdural hematoma   . Anemia due to GI blood loss   . Prostate cancer     Past Surgical History  Procedure Laterality Date  . Tonsillectomy  as child  . Arthrscopic left knee surgery  02-11-2007  . Cardioversion  09-04-2006  . Av node ablation  04-06-2007  . Subdural hematoma evacuation via craniotomy  12-16-2007  . Rotator cuff repair w/ distal clavicle excision  08-31-2010    left  . Pacemaker insertion      [employer] Insignia   . Total knee arthroplasty  12/18/2010    Procedure: TOTAL KNEE ARTHROPLASTY;  Surgeon: Mauri Pole;  Location: [facility];  Service: Orthopedics;  Laterality: Left;  . Joint replacement    . Brain surgery    . Cardioversion    . Cataract extraction    . Left knee arthroscopy      Family History  Problem Relation Age of Onset  . Aneurysm Father   . Stroke Father      Social History:  reports that he has never smoked. He has never used smokeless tobacco. He reports that he drinks about 7.7 ounces of alcohol per week. He reports that he does not use illicit drugs.  Allergies: No Known Allergies  Medications: I have reviewed the patient's current medications.  Results for orders placed during the hospital encounter of 05/28/13 (from the past 48 hour(s))  CBC WITH DIFFERENTIAL     Status: Abnormal   Collection Time    05/28/13  7:46 PM      Result Value Ref Range   WBC 5.9  4.0 - 10.5 K/uL   RBC 2.90 (*) 4.22 - 5.81 MIL/uL   Hemoglobin 9.7 (*) 13.0 - 17.0 g/dL   HCT 28.6 (*) 39.0 - 52.0 %   MCV 98.6  78.0 - 100.0 fL   MCH 33.4  26.0 - 34.0 pg   MCHC 33.9  30.0 - 36.0 g/dL   RDW 13.0  11.5 - 15.5 %   Platelets 181  150 - 400 K/uL   Neutrophils Relative % 72  43 - 77 %   Neutro Abs 4.2  1.7 - 7.7 K/uL   Lymphocytes Relative 20  12 - 46 %   Lymphs  Abs 1.2  0.7 - 4.0 K/uL   Monocytes Relative 6  3 - 12 %   Monocytes Absolute 0.3  0.1 - 1.0 K/uL   Eosinophils Relative 2  0 - 5 %   Eosinophils Absolute 0.1  0.0 - 0.7 K/uL   Basophils Relative 0  0 - 1 %   Basophils Absolute 0.0  0.0 - 0.1 K/uL  COMPREHENSIVE METABOLIC PANEL     Status: Abnormal   Collection Time    05/28/13  7:46 PM      Result Value Ref Range   Sodium 140  137 - 147 mEq/L   Potassium 6.0 (*) 3.7 - 5.3 mEq/L   Chloride 103  96 - 112 mEq/L   CO2 21  19 - 32 mEq/L   Glucose, Bld 107 (*) 70 - 99 mg/dL   BUN 68 (*) 6 - 23 mg/dL   Creatinine, Ser 4.48 (*) 0.50 - 1.35 mg/dL   Calcium 10.0  8.4 - 10.5 mg/dL   Total Protein 7.3  6.0 - 8.3 g/dL   Albumin 4.0  3.5 - 5.2 g/dL   AST 17  0 - 37 U/L   ALT 10  0 - 53 U/L   Alkaline Phosphatase 69  39 - 117 U/L   Total Bilirubin 0.4  0.3 - 1.2 mg/dL   GFR calc non Af Amer 11 (*) >90 mL/min   GFR calc Af Amer 13 (*) >90 mL/min   Comment: (NOTE)     The eGFR has been calculated using the CKD EPI equation.     This calculation has  not been validated in all clinical situations.     eGFR's persistently <90 mL/min signify possible Chronic Kidney     Disease.  URINALYSIS, ROUTINE W REFLEX MICROSCOPIC     Status: None   Collection Time    05/28/13 11:35 PM      Result Value Ref Range   Color, Urine YELLOW  YELLOW   APPearance CLEAR  CLEAR   Specific Gravity, Urine 1.014  1.005 - 1.030   pH 5.0  5.0 - 8.0   Glucose, UA NEGATIVE  NEGATIVE mg/dL   Hgb urine dipstick NEGATIVE  NEGATIVE   Bilirubin Urine NEGATIVE  NEGATIVE   Ketones, ur NEGATIVE  NEGATIVE mg/dL   Protein, ur NEGATIVE  NEGATIVE mg/dL   Urobilinogen, UA 0.2  0.0 - 1.0 mg/dL   Nitrite NEGATIVE  NEGATIVE   Leukocytes, UA NEGATIVE  NEGATIVE   Comment: MICROSCOPIC NOT DONE ON URINES WITH NEGATIVE PROTEIN, BLOOD, LEUKOCYTES, NITRITE, OR GLUCOSE <1000 mg/dL.  GLUCOSE, RANDOM     Status: Abnormal   Collection Time    05/29/13  1:55 AM      Result Value Ref Range   Glucose, Bld 130 (*) 70 - 99 mg/dL  GLUCOSE, CAPILLARY     Status: Abnormal   Collection Time    05/29/13  2:44 AM      Result Value Ref Range   Glucose-Capillary 47 (*) 70 - 99 mg/dL  RETICULOCYTES     Status: Abnormal   Collection Time    05/29/13  3:20 AM      Result Value Ref Range   Retic Ct Pct 0.9  0.4 - 3.1 %   RBC. 2.57 (*) 4.22 - 5.81 MIL/uL   Retic Count, Manual 23.1  19.0 - 186.0 K/uL  BASIC METABOLIC PANEL     Status: Abnormal   Collection Time    05/29/13  3:20 AM  Result Value Ref Range   Sodium 140  137 - 147 mEq/L   Potassium 5.0  3.7 - 5.3 mEq/L   Chloride 107  96 - 112 mEq/L   CO2 19  19 - 32 mEq/L   Glucose, Bld 78  70 - 99 mg/dL   BUN 68 (*) 6 - 23 mg/dL   Creatinine, Ser 4.53 (*) 0.50 - 1.35 mg/dL   Calcium 9.2  8.4 - 10.5 mg/dL   GFR calc non Af Amer 11 (*) >90 mL/min   GFR calc Af Amer 13 (*) >90 mL/min   Comment: (NOTE)     The eGFR has been calculated using the CKD EPI equation.     This calculation has not been validated in all clinical situations.      eGFR's persistently <90 mL/min signify possible Chronic Kidney     Disease.  TSH     Status: None   Collection Time    05/29/13  3:20 AM      Result Value Ref Range   TSH 0.675  0.350 - 4.500 uIU/mL   Comment: Please note change in reference range.  GLUCOSE, CAPILLARY     Status: Abnormal   Collection Time    05/29/13  3:24 AM      Result Value Ref Range   Glucose-Capillary 100 (*) 70 - 99 mg/dL  MAGNESIUM     Status: None   Collection Time    05/29/13  9:05 AM      Result Value Ref Range   Magnesium 1.6  1.5 - 2.5 mg/dL  PHOSPHORUS     Status: None   Collection Time    05/29/13  9:05 AM      Result Value Ref Range   Phosphorus 4.2  2.3 - 4.6 mg/dL  COMPREHENSIVE METABOLIC PANEL     Status: Abnormal   Collection Time    05/29/13  9:05 AM      Result Value Ref Range   Sodium 141  137 - 147 mEq/L   Potassium 4.7  3.7 - 5.3 mEq/L   Chloride 105  96 - 112 mEq/L   CO2 17 (*) 19 - 32 mEq/L   Glucose, Bld 137 (*) 70 - 99 mg/dL   BUN 65 (*) 6 - 23 mg/dL   Creatinine, Ser 4.46 (*) 0.50 - 1.35 mg/dL   Calcium 9.0  8.4 - 10.5 mg/dL   Total Protein 6.6  6.0 - 8.3 g/dL   Albumin 3.5  3.5 - 5.2 g/dL   AST 15  0 - 37 U/L   ALT 10  0 - 53 U/L   Alkaline Phosphatase 61  39 - 117 U/L   Total Bilirubin 0.3  0.3 - 1.2 mg/dL   GFR calc non Af Amer 11 (*) >90 mL/min   GFR calc Af Amer 13 (*) >90 mL/min   Comment: (NOTE)     The eGFR has been calculated using the CKD EPI equation.     This calculation has not been validated in all clinical situations.     eGFR's persistently <90 mL/min signify possible Chronic Kidney     Disease.  CBC     Status: Abnormal   Collection Time    05/29/13  9:05 AM      Result Value Ref Range   WBC 6.5  4.0 - 10.5 K/uL   RBC 2.63 (*) 4.22 - 5.81 MIL/uL   Hemoglobin 8.7 (*) 13.0 - 17.0 g/dL   HCT 25.8 (*) 39.0 -  52.0 %   MCV 98.1  78.0 - 100.0 fL   MCH 33.1  26.0 - 34.0 pg   MCHC 33.7  30.0 - 36.0 g/dL   RDW 13.2  11.5 - 15.5 %   Platelets 144 (*) 150  - 400 K/uL    Dg Chest 2 View  05/29/2013   CLINICAL DATA:  Cough  EXAM: CHEST  2 VIEW  COMPARISON:  03/18/2013  FINDINGS: Hyperinflation suggests COPD but coarsening of the interstitial markings but no focal lobar opacity. No pleural effusion. Heart size is mildly enlarged without evidence for edema. Dual lead left-sided pacer in place. No pneumothorax. No acute osseous finding.  IMPRESSION: Hyperinflation suggesting emphysema with mild cardiomegaly.   Electronically Signed   By: Conchita Paris M.D.   On: 05/29/2013 12:29   US Renal  05/29/2013   CLINICAL DATA:  Acute renal failure. History of prostate cancer. Postobstructive acute renal failure suspected per electronic medical record.  EXAM: RENAL/URINARY TRACT ULTRASOUND COMPLETE  COMPARISON:  Abdominal ultrasound 09/19/2011  FINDINGS: Right Kidney:  Length: 13 cm. Moderate hydronephrosis and proximal hydroureter. A cause of obstruction is not visualized. Normal cortical echogenicity. No evidence of solid mass.  Left Kidney:  Length: 13 cm. Moderate hydronephrosis and proximal hydroureter. A cause of obstruction is not visualized. Normal cortical echogenicity. No evidence of solid mass.  Bladder:  Decompressed around a Foley catheter.  IMPRESSION: Moderate bilateral hydronephrosis with decompressed bladder. While the hydronephrosis could be related to recent bladder outlet obstruction, suggest follow-up examination to document normalization (thereby excluding an obstructive retroperitoneal process affecting the distal ureters.)   Electronically Signed   By: Jorje Guild M.D.   On: 05/29/2013 11:29    Review of Systems  Constitutional: Negative.  Negative for fever and chills.  HENT: Negative.   Eyes: Negative.   Respiratory: Negative.   Cardiovascular: Negative.   Gastrointestinal: Negative.  Negative for nausea and vomiting.  Genitourinary: Negative.  Negative for hematuria and flank pain.  Musculoskeletal: Negative.   Skin: Negative.    Neurological: Negative.   Endo/Heme/Allergies: Negative.   Psychiatric/Behavioral: Negative.    Blood pressure 157/94, pulse 87, temperature 98 F (36.7 C), temperature source Oral, resp. rate 19, height 6' (1.829 m), weight 78.3 kg (172 lb 9.9 oz), SpO2 98.00%. Physical Exam  Constitutional: He is oriented to person, place, and time. He appears well-developed and well-nourished.  Very pleasant  HENT:  Head: Normocephalic and atraumatic.  Eyes: EOM are normal. Pupils are equal, round, and reactive to light.  Neck: Normal range of motion.  Cardiovascular: Normal rate.   Respiratory: Effort normal.  GI: Bowel sounds are normal.  Genitourinary: Penis normal.  Foley c/d/i with clear yellow urine  Musculoskeletal: Normal range of motion.  Neurological: He is alert and oriented to person, place, and time.  Skin: Skin is warm and dry.  Psychiatric: He has a normal mood and affect. His behavior is normal. Judgment and thought content normal.    Assessment/Plan:  1 - Acute Renal Failure With Hydronephrosis  - Certainly concerning for bladder neck / prostat obstruction, possibly prostate cancer recurrence. Discussed options for renal drainage including neph tubes v. Attempt endoscopic management with cysto, retrogrades, bladder biopsy / resection, bilateral stents and they want attmpt of the latter. I estimated chances of success 50% and if not, then would need neph tubes.  Risks including bleeding, infection, damage to kidney / ureter / bladder, non-cure, need for chronic stent changes, non-diagnosis, as well as rare risks such  as DVT, PE, MI, CVA, Mortality discussed.   2 - Prostate Cancer - PSA today pending, rise ceratinly concerning for recurrence, would strongly consider androgen deprivation in continued PSA rise or histologic tumor of biopsy.   [facility] to [place] for surgery as most tools available there for successful procedure.   Alexis Frock 05/29/2013, 4:04 PM

## 2013-05-29 NOTE — Progress Notes (Signed)
[facility] PROGRESS NOTE  [name] PYP:[id number] DOB: 1931/12/13 DOA: 05/28/2013 PCP: [name], MD  Assessment/Plan: 1. Acute renal failure -he had a baseline creatinine of 1.2 in April -on ultrasound appears to have an obstructive uropathy -urology consult ordered also consider nephrology consult ordered  2. Atrial fibrillation -right now rate controlled -monitor rate continue beta blockers  3. Anemia -check iron studies -get hemoccult  4. Cough -appears to be more consistent with asthmatic cough -will start on dulera  5. Hyperkalemia -improved -monitor labs  Code Status: Full Code Family Communication: Wife and Daughter in room (indicate person spoken with, relationship, and if by phone, the number) Disposition Plan: Home   Consultants:  Urology ordered  Nephrology ordered  Procedures:  Ultrasound of Kidneys  Antibiotics:  None  HPI/Subjective: 6 yow male admitted with worsening of creatinine from baseline of 1.2 most recently. On ultrasound has an obstructive uropathy. He does have history of prostate issues in the past treated by urology. He is basically asymptomatic other than a cough which appears to sound like a asthmatic cough.  Objective: Filed Vitals:   05/29/13 0216  BP: 157/94  Pulse: 87  Temp: 98 F (36.7 C)  Resp: 19    Intake/Output Summary (Last 24 hours) at 05/29/13 1444 Last data filed at 05/29/13 1243  Gross per 24 hour  Intake 641.25 ml  Output   1375 ml  Net -733.75 ml   Filed Weights   05/28/13 1939 05/29/13 0216  Weight: 79.379 kg (175 lb) 78.3 kg (172 lb 9.9 oz)    Exam:   General:  Awake and alert no distress  Cardiovascular: RRR no gallop or rubs  Respiratory: good air entry no rales noted  Abdomen: soft non-tender  Musculoskeletal: No active synovitis  Data Reviewed: Basic Metabolic Panel:  Recent Labs Lab 05/28/13 1946 05/29/13 0155 05/29/13 0320 05/29/13 0905  NA 140   --  140 141  K 6.0*  --  5.0 4.7  CL 103  --  107 105  CO2 21  --  19 17*  GLUCOSE 107* 130* 78 137*  BUN 68*  --  68* 65*  CREATININE 4.48*  --  4.53* 4.46*  CALCIUM 10.0  --  9.2 9.0  MG  --   --   --  1.6  PHOS  --   --   --  4.2   Liver Function Tests:  Recent Labs Lab 05/28/13 1946 05/29/13 0905  AST 17 15  ALT 10 10  ALKPHOS 69 61  BILITOT 0.4 0.3  PROT 7.3 6.6  ALBUMIN 4.0 3.5   No results found for this basename: LIPASE, AMYLASE,  in the last 168 hours No results found for this basename: AMMONIA,  in the last 168 hours CBC:  Recent Labs Lab 05/28/13 1946 05/29/13 0905  WBC 5.9 6.5  NEUTROABS 4.2  --   HGB 9.7* 8.7*  HCT 28.6* 25.8*  MCV 98.6 98.1  PLT 181 144*   Cardiac Enzymes: No results found for this basename: CKTOTAL, CKMB, CKMBINDEX, TROPONINI,  in the last 168 hours BNP (last 3 results) No results found for this basename: PROBNP,  in the last 8760 hours CBG:  Recent Labs Lab 05/29/13 0244 05/29/13 0324  GLUCAP 47* 100*    No results found for this or any previous visit (from the past 240 hour(s)).   Studies: Dg Chest 2 View  05/29/2013   CLINICAL DATA:  Cough  EXAM: CHEST  2 VIEW  COMPARISON:  03/18/2013  FINDINGS: Hyperinflation suggests COPD but coarsening of the interstitial markings but no focal lobar opacity. No pleural effusion. Heart size is mildly enlarged without evidence for edema. Dual lead left-sided pacer in place. No pneumothorax. No acute osseous finding.  IMPRESSION: Hyperinflation suggesting emphysema with mild cardiomegaly.   Electronically Signed   By: Conchita Paris M.D.   On: 05/29/2013 12:29   US Renal  05/29/2013   CLINICAL DATA:  Acute renal failure. History of prostate cancer. Postobstructive acute renal failure suspected per electronic medical record.  EXAM: RENAL/URINARY TRACT ULTRASOUND COMPLETE  COMPARISON:  Abdominal ultrasound 09/19/2011  FINDINGS: Right Kidney:  Length: 13 cm. Moderate hydronephrosis and  proximal hydroureter. A cause of obstruction is not visualized. Normal cortical echogenicity. No evidence of solid mass.  Left Kidney:  Length: 13 cm. Moderate hydronephrosis and proximal hydroureter. A cause of obstruction is not visualized. Normal cortical echogenicity. No evidence of solid mass.  Bladder:  Decompressed around a Foley catheter.  IMPRESSION: Moderate bilateral hydronephrosis with decompressed bladder. While the hydronephrosis could be related to recent bladder outlet obstruction, suggest follow-up examination to document normalization (thereby excluding an obstructive retroperitoneal process affecting the distal ureters.)   Electronically Signed   By: Jorje Guild M.D.   On: 05/29/2013 11:29    Scheduled Meds: . darifenacin  15 mg Oral Daily  . docusate sodium  100 mg Oral BID  . levothyroxine  137 mcg Oral QAC breakfast  . propranolol ER  60 mg Oral Daily  . sodium chloride  3 mL Intravenous Q12H  . tamsulosin  0.4 mg Oral Daily   Continuous Infusions:   Active Problems:   ATRIAL FIBRILLATION-paroxysmal   Pacemaker-dual-chamber-[employer]   Acute renal failure   Anemia   ARF (acute renal failure)   Hyperkalemia    Time spent: 8min    Allyne Gee  [facility] Pager [phone] If 7PM-7AM, please contact night-coverage at [email or web address], password [facility] 05/29/2013, 2:44 PM  LOS: 1 day

## 2013-05-30 ENCOUNTER — Inpatient Hospital Stay (HOSPITAL_COMMUNITY): Payer: Medicare Other | Admitting: Anesthesiology

## 2013-05-30 ENCOUNTER — Encounter (HOSPITAL_COMMUNITY)
Admission: EM | Disposition: A | Discharge status: Home / Self Care | Payer: Self-pay | Source: Home / Self Care | Attending: Internal Medicine

## 2013-05-30 ENCOUNTER — Encounter (HOSPITAL_BASED_OUTPATIENT_CLINIC_OR_DEPARTMENT_OTHER): Payer: Self-pay | Admitting: Anesthesiology

## 2013-05-30 ENCOUNTER — Inpatient Hospital Stay (HOSPITAL_COMMUNITY): Payer: Medicare Other

## 2013-05-30 ENCOUNTER — Encounter (HOSPITAL_COMMUNITY): Payer: Medicare Other | Admitting: Anesthesiology

## 2013-05-30 DIAGNOSIS — D649 Anemia, unspecified: Secondary | ICD-10-CM

## 2013-05-30 DIAGNOSIS — E875 Hyperkalemia: Secondary | ICD-10-CM

## 2013-05-30 DIAGNOSIS — I4891 Unspecified atrial fibrillation: Secondary | ICD-10-CM

## 2013-05-30 DIAGNOSIS — N179 Acute kidney failure, unspecified: Secondary | ICD-10-CM

## 2013-05-30 HISTORY — PX: CYSTOSCOPY W/ URETERAL STENT PLACEMENT: SHX1429

## 2013-05-30 LAB — BASIC METABOLIC PANEL
BUN: 59 mg/dL — ABNORMAL HIGH (ref 6–23)
CHLORIDE: 106 meq/L (ref 96–112)
CO2: 19 mEq/L (ref 19–32)
Calcium: 8.5 mg/dL (ref 8.4–10.5)
Creatinine, Ser: 4.59 mg/dL — ABNORMAL HIGH (ref 0.50–1.35)
GFR calc Af Amer: 13 mL/min — ABNORMAL LOW (ref >90)
GFR calc non Af Amer: 11 mL/min — ABNORMAL LOW (ref >90)
Glucose, Bld: 98 mg/dL (ref 70–99)
Potassium: 4.2 mEq/L (ref 3.7–5.3)
Sodium: 142 mEq/L (ref 137–147)

## 2013-05-30 SURGERY — CYSTOSCOPY, WITH RETROGRADE PYELOGRAM AND URETERAL STENT INSERTION
Anesthesia: General | Site: Ureter | Laterality: Bilateral

## 2013-05-30 MED ORDER — PROPOFOL 10 MG/ML IV BOLUS
INTRAVENOUS | Status: DC | PRN
  Administered 2013-05-30: 50 mg via INTRAVENOUS
  Administered 2013-05-30: 200 mg via INTRAVENOUS

## 2013-05-30 MED ORDER — PHENYLEPHRINE HCL 10 MG/ML IJ SOLN
INTRAMUSCULAR | Status: DC | PRN
  Administered 2013-05-30 (×4): 80 ug via INTRAVENOUS

## 2013-05-30 MED ORDER — METHYLENE BLUE 1 % INJ SOLN
INTRAMUSCULAR | Status: DC | PRN
  Administered 2013-05-30: 10 mL via INTRAVENOUS

## 2013-05-30 MED ORDER — DEXTROSE 5 % IV SOLN
1.0000 g | INTRAVENOUS | Status: DC | PRN
  Administered 2013-05-30: 1 g via INTRAVENOUS

## 2013-05-30 MED ORDER — ONDANSETRON HCL 4 MG/2ML IJ SOLN
INTRAMUSCULAR | Status: DC | PRN
  Administered 2013-05-30: 4 mg via INTRAVENOUS

## 2013-05-30 MED ORDER — SODIUM CHLORIDE 0.9 % IR SOLN
Status: DC | PRN
  Administered 2013-05-30 (×6): 1000 mL via INTRAVESICAL

## 2013-05-30 MED ORDER — PROMETHAZINE HCL 25 MG/ML IJ SOLN: 6.2500 mg | INTRAMUSCULAR | Status: DC | PRN

## 2013-05-30 MED ORDER — TIOTROPIUM BROMIDE MONOHYDRATE 18 MCG IN CAPS
18.0000 ug | ORAL_CAPSULE | Freq: Every day | RESPIRATORY_TRACT | Status: DC
  Administered 2013-05-31 – 2013-06-04 (×5): 18 ug via RESPIRATORY_TRACT
  Filled 2013-05-30: qty 5

## 2013-05-30 MED ORDER — FENTANYL CITRATE 0.05 MG/ML IJ SOLN
25.0000 ug | INTRAMUSCULAR | Status: DC | PRN
  Administered 2013-05-30 (×2): 50 ug via INTRAVENOUS

## 2013-05-30 MED ORDER — CEFTRIAXONE SODIUM 1 G IJ SOLR
1.0000 g | INTRAMUSCULAR | Status: DC
Start: 2013-05-30 — End: 2013-05-30
  Filled 2013-05-30: qty 10

## 2013-05-30 MED ORDER — LACTATED RINGERS IV SOLN
INTRAVENOUS | Status: DC | PRN
  Administered 2013-05-30 (×2): via INTRAVENOUS

## 2013-05-30 MED ORDER — METHYLENE BLUE 1 % INJ SOLN
INTRAMUSCULAR | Status: AC
  Filled 2013-05-30: qty 10

## 2013-05-30 MED ORDER — FENTANYL CITRATE 0.05 MG/ML IJ SOLN
INTRAMUSCULAR | Status: DC | PRN
  Administered 2013-05-30 (×2): 50 ug via INTRAVENOUS

## 2013-05-30 MED ORDER — DEXAMETHASONE SODIUM PHOSPHATE 10 MG/ML IJ SOLN
INTRAMUSCULAR | Status: DC | PRN
  Administered 2013-05-30: 10 mg via INTRAVENOUS

## 2013-05-30 MED ORDER — LIDOCAINE HCL (CARDIAC) 20 MG/ML IV SOLN
INTRAVENOUS | Status: DC | PRN
  Administered 2013-05-30: 100 mg via INTRAVENOUS

## 2013-05-30 MED ORDER — IOHEXOL 300 MG/ML  SOLN
INTRAMUSCULAR | Status: DC | PRN
  Administered 2013-05-30: 10 mL via INTRAVENOUS

## 2013-05-30 SURGICAL SUPPLY — 26 items
BAG URINE DRAINAGE (UROLOGICAL SUPPLIES) ×7 IMPLANT
BAG URO CATCHER STRL LF (DRAPE) ×4 IMPLANT
CATH BEACON 5.038 65CM KMP-01 (CATHETERS) ×3 IMPLANT
CATH FOLEY 2WAY SLVR  5CC 16FR (CATHETERS) ×2
CATH FOLEY 2WAY SLVR 5CC 16FR (CATHETERS) ×1 IMPLANT
CATH FOLEY 3WAY 30CC 22FR (CATHETERS) ×4 IMPLANT
DRAPE CAMERA CLOSED 9X96 (DRAPES) ×4 IMPLANT
ELECT BUTTON HF 24-28F 2 30DE (ELECTRODE) ×4 IMPLANT
ELECT LOOP MED HF 24F 12D (CUTTING LOOP) ×7 IMPLANT
ELECT LOOP MED HF 24F 12D CBL (CLIP) ×4 IMPLANT
ELECT RESECT VAPORIZE 12D CBL (ELECTRODE) ×4 IMPLANT
GLOVE BIOGEL M STRL SZ7.5 (GLOVE) ×4 IMPLANT
GOWN STRL REUS W/TWL XL LVL3 (GOWN DISPOSABLE) ×4 IMPLANT
GUIDEWIRE ANG ZIPWIRE 038X150 (WIRE) ×3 IMPLANT
GUIDEWIRE STR DUAL SENSOR (WIRE) ×3 IMPLANT
HOLDER FOLEY CATH W/STRAP (MISCELLANEOUS) IMPLANT
IV NS IRRIG 3000ML ARTHROMATIC (IV SOLUTION) ×8 IMPLANT
KIT ASPIRATION TUBING (SET/KITS/TRAYS/PACK) IMPLANT
MANIFOLD NEPTUNE II (INSTRUMENTS) ×4 IMPLANT
PACK CYSTO (CUSTOM PROCEDURE TRAY) ×4 IMPLANT
PLUG CATH AND CAP STER (CATHETERS) ×4 IMPLANT
STENT CONTOUR 6FRX26X.038 (STENTS) ×6 IMPLANT
SYR 30ML LL (SYRINGE) IMPLANT
SYRINGE IRR TOOMEY STRL 70CC (SYRINGE) ×4 IMPLANT
TUBING CONNECTING 10 (TUBING) ×3 IMPLANT
TUBING CONNECTING 10' (TUBING) ×1

## 2013-05-30 NOTE — Progress Notes (Signed)
[facility] at bedside to transport pt to [facility] for procedure; family at bedside and will follow pt to [facility].

## 2013-05-30 NOTE — Progress Notes (Signed)
[facility] PROGRESS NOTE  [name] TDV:[id number] DOB: 28-Feb-1931 DOA: 05/28/2013 PCP: [name], MD  Assessment/Plan: 1. Acute renal failure -spoke to urology he had cystoscopy done and this shows possible tumor obstructing the ureters -await pathology -appreciate urology input  2. Atrial fibrillation -right now rate controlled -monitor rate continue beta blockers  3. Anemia -likely chronic disease  4. Cough -appears to be more consistent with asthmatic cough -started on dulera -CXR shows COPD  5. Hyperkalemia -improved -monitor labs  Code Status: Full Code Family Communication: Wife and Daughter in room (indicate person spoken with, relationship, and if by phone, the number) Disposition Plan: Home   Consultants:  Urology ordered  Nephrology ordered  Procedures:  Ultrasound of Kidneys  Antibiotics:  None  HPI/Subjective: 28 yow male admitted with worsening of creatinine from baseline of 1.2 most recently. On ultrasound has an obstructive uropathy. He does have history of prostate issues in the past treated by urology. Went for cystoscopy and had resection of tumor.  Objective: Filed Vitals:   05/30/13 1340  BP:   Pulse: 69  Temp:   Resp: 11    Intake/Output Summary (Last 24 hours) at 05/30/13 1432 Last data filed at 05/30/13 1342  Gross per 24 hour  Intake   1150 ml  Output   1425 ml  Net   -275 ml   Filed Weights   05/28/13 1939 05/29/13 0216  Weight: 79.379 kg (175 lb) 78.3 kg (172 lb 9.9 oz)    Exam:   General:  Awake and alert no distress  Cardiovascular: RRR no gallop or rubs  Respiratory: good air entry no rales noted  Abdomen: soft non-tender  Musculoskeletal: No active synovitis  Data Reviewed: Basic Metabolic Panel:  Recent Labs Lab 05/29/13 0320 05/29/13 0905 05/29/13 1520 05/29/13 2009 05/30/13 0755  NA 140 141 135* 141 142  K 5.0 4.7 4.4 4.4 4.2  CL 107 105 102 105 106  CO2 19 17* 18*  19 19  GLUCOSE 78 137* 108* 116* 98  BUN 68* 65* 63* 62* 59*  CREATININE 4.53* 4.46* 4.56* 4.70* 4.59*  CALCIUM 9.2 9.0 8.4 8.8 8.5  MG  --  1.6  --   --   --   PHOS  --  4.2  --   --   --    Liver Function Tests:  Recent Labs Lab 05/28/13 1946 05/29/13 0905  AST 17 15  ALT 10 10  ALKPHOS 69 61  BILITOT 0.4 0.3  PROT 7.3 6.6  ALBUMIN 4.0 3.5   No results found for this basename: LIPASE, AMYLASE,  in the last 168 hours No results found for this basename: AMMONIA,  in the last 168 hours CBC:  Recent Labs Lab 05/28/13 1946 05/29/13 0905  WBC 5.9 6.5  NEUTROABS 4.2  --   HGB 9.7* 8.7*  HCT 28.6* 25.8*  MCV 98.6 98.1  PLT 181 144*   Cardiac Enzymes: No results found for this basename: CKTOTAL, CKMB, CKMBINDEX, TROPONINI,  in the last 168 hours BNP (last 3 results) No results found for this basename: PROBNP,  in the last 8760 hours CBG:  Recent Labs Lab 05/29/13 0244 05/29/13 0324  GLUCAP 47* 100*    No results found for this or any previous visit (from the past 240 hour(s)).   Studies: Ct Abdomen Pelvis Wo Contrast  05/30/2013   CLINICAL DATA:  Acute renal failure. Bilateral hydronephrosis at recent ultrasound.  EXAM: CT ABDOMEN AND PELVIS WITHOUT CONTRAST  TECHNIQUE:  Multidetector CT imaging of the abdomen and pelvis was performed following the standard protocol without IV contrast.  COMPARISON:  Renal ultrasound obtained yesterday.  FINDINGS: Again demonstrated is moderate dilatation of both renal collecting systems and ureters. The ureters are dilated to the level of the urinary bladder. There is moderate diffuse bladder wall thickening. Foley catheter in the bladder. No urinary tract calculi are seen. Prostate radiation seed implants are demonstrated with no enlargement of the prostate gland. Small bilateral inguinal hernias containing fat.  Small bilateral pleural effusions. Mild bilateral lower lobe atelectasis. Mild patchy opacity in the lingula. Multiple small  calcified granulomata in the liver and spleen. Unremarkable non contrasted appearance of the pancreas, gallbladder and adrenal glands. Small amount of free peritoneal fluid in the inferior pelvis. This is slightly higher in density than simple fluid, measuring between 8 and 13 Hounsfield units.  Minimal sigmoid diverticulosis without evidence of diverticulitis. No enlarged lymph nodes. Mildly enlarged heart. Lumbar lower thoracic spine degenerative changes. No evidence of bony metastatic disease.  IMPRESSION: 1. Moderate bilateral hydronephrosis and hydroureter to the level of a decompressed urinary bladder with moderate diffuse wall thickening. This is concerning for the possibility of a bladder neoplasm causing ureteral obstruction. Recommend cystoscopy. 2. Small amount of minimally complex free peritoneal fluid in the inferior pelvis. 3. Mild cardiomegaly. 4. Small bilateral pleural effusions and mild bilateral lower lobe atelectasis. 5. Mild patchy scarring or atelectasis in the lingula. 6. Small bilateral inguinal hernias containing fat.   Electronically Signed   By: Enrique Sack M.D.   On: 05/30/2013 01:27   Dg Chest 2 View  05/29/2013   CLINICAL DATA:  Cough  EXAM: CHEST  2 VIEW  COMPARISON:  03/18/2013  FINDINGS: Hyperinflation suggests COPD but coarsening of the interstitial markings but no focal lobar opacity. No pleural effusion. Heart size is mildly enlarged without evidence for edema. Dual lead left-sided pacer in place. No pneumothorax. No acute osseous finding.  IMPRESSION: Hyperinflation suggesting emphysema with mild cardiomegaly.   Electronically Signed   By: Conchita Paris M.D.   On: 05/29/2013 12:29   US Renal  05/29/2013   CLINICAL DATA:  Acute renal failure. History of prostate cancer. Postobstructive acute renal failure suspected per electronic medical record.  EXAM: RENAL/URINARY TRACT ULTRASOUND COMPLETE  COMPARISON:  Abdominal ultrasound 09/19/2011  FINDINGS: Right Kidney:  Length: 13  cm. Moderate hydronephrosis and proximal hydroureter. A cause of obstruction is not visualized. Normal cortical echogenicity. No evidence of solid mass.  Left Kidney:  Length: 13 cm. Moderate hydronephrosis and proximal hydroureter. A cause of obstruction is not visualized. Normal cortical echogenicity. No evidence of solid mass.  Bladder:  Decompressed around a Foley catheter.  IMPRESSION: Moderate bilateral hydronephrosis with decompressed bladder. While the hydronephrosis could be related to recent bladder outlet obstruction, suggest follow-up examination to document normalization (thereby excluding an obstructive retroperitoneal process affecting the distal ureters.)   Electronically Signed   By: Jorje Guild M.D.   On: 05/29/2013 11:29    Scheduled Meds: . darifenacin  15 mg Oral Daily  . docusate sodium  100 mg Oral BID  . levothyroxine  137 mcg Oral QAC breakfast  . mometasone-formoterol  2 puff Inhalation BID  . propranolol ER  60 mg Oral Daily  . sodium chloride  3 mL Intravenous Q12H  . tamsulosin  0.4 mg Oral Daily  . tiotropium  18 mcg Inhalation Daily   Continuous Infusions:   Active Problems:   ATRIAL FIBRILLATION-paroxysmal   Pacemaker-dual-chamber-[employer]   Acute renal failure   Anemia   ARF (acute renal failure)   Hyperkalemia    Time spent: 28min    Allyne Gee  [facility] Pager [phone] If 7PM-7AM, please contact night-coverage at [email or web address], password [facility] 05/30/2013, 2:32 PM  LOS: 2 days

## 2013-05-30 NOTE — Transfer of Care (Signed)
Immediate Anesthesia Transfer of Care Note  Patient: QUARTEZ LAGOS  Procedure(s) Performed: Procedure(s): CYSTOSCOPY WITH RETROGRADE PYELOGRAM/ BILATERAL URETERAL STENT PLACEMENT, TRANSURETRAL RESECTION OF BLADDER TUMOR WITH GYRUS (Bilateral)  Patient Location: PACU  Anesthesia Type:General  Level of Consciousness: sedated  Airway & Oxygen Therapy: Patient Spontanous Breathing and Patient connected to face mask oxygen  Post-op Assessment: Report given to PACU RN and Post -op Vital signs reviewed and stable  Post vital signs: Reviewed and stable  Complications: No apparent anesthesia complications

## 2013-05-30 NOTE — Progress Notes (Signed)
Pt back in room from [facility]; pt awake and alert; pt denies pain at this time; will cont. To monitor.

## 2013-05-30 NOTE — Brief Op Note (Signed)
05/28/2013 - 05/30/2013  12:33 PM  PATIENT:  [name]  80 y.o. male  PRE-OPERATIVE DIAGNOSIS:  prostate cancer, acute renal failure, hydronephrosis  POST-OPERATIVE DIAGNOSIS:  prostate cancer, acute renal failure, hydronephrosis  PROCEDURE:  Procedure(s): CYSTOSCOPY WITH RETROGRADE PYELOGRAM/ BILATERAL URETERAL STENT PLACEMENT, TRANSURETRAL RESECTION OF BLADDER TUMOR WITH GYRUS (Bilateral)  SURGEON:  Surgeon(s) and Role:    * Alexis Frock, MD - Primary  PHYSICIAN ASSISTANT:   ASSISTANTS: none   ANESTHESIA:   general  EBL:  Total I/O In: 1000 [I.V.:1000] Out: -   BLOOD ADMINISTERED:none  DRAINS: 35F foley to straight drain   LOCAL MEDICATIONS USED:  NONE  SPECIMEN:  Source of Specimen:  Bladder neck / trigone / prostate base mass fragments  DISPOSITION OF SPECIMEN:  PATHOLOGY  COUNTS:  YES  TOURNIQUET:  * No tourniquets in log *  DICTATION: .Other Dictation: Dictation Number [id number]  PLAN OF CARE: Admit to inpatient   PATIENT DISPOSITION:  PACU - hemodynamically stable.   Delay start of Pharmacological VTE agent (>24hrs) due to surgical blood loss or risk of bleeding: not applicable

## 2013-05-30 NOTE — Anesthesia Preprocedure Evaluation (Addendum)
Anesthesia Evaluation  Patient identified by MRN, date of birth, ID band Patient awake  General Assessment Comment: Bradycardia         s/p PPM   .  Atrial flutter         s/p ablation   .  Atrial fibrillation     .  Prostate cancer  12-21-2003       12-7-seed implants done   .  Hematoma     .  Mitral regurgitation         mild echo 2012   .  Pacemaker --[employer]     .  Orthostatic lightheadedness     .  Subdural hematoma     Reviewed: Allergy & Precautions, H&P , NPO status , Patient's Chart, lab work & pertinent test results  Airway Mallampati: II TM Distance: >3 FB Neck ROM: Full    Dental no notable dental hx.    Pulmonary neg pulmonary ROS,  Chronic cough for three months of unknown etiology. Nebulizer treatment today.  Lungs clear with good air exchange. breath sounds clear to auscultation  Pulmonary exam normal       Cardiovascular Exercise Tolerance: Good + dysrhythmias Atrial Fibrillation + pacemaker Rhythm:Regular Rate:Normal     Neuro/Psych negative neurological ROS  negative psych ROS   GI/Hepatic negative GI ROS, Neg liver ROS,   Endo/Other  negative endocrine ROS  Renal/GU ARFRenal diseaseK 4.2 Cr 4.59  negative genitourinary   Musculoskeletal negative musculoskeletal ROS (+)   Abdominal   Peds negative pediatric ROS (+)  Hematology  (+) anemia , Hgb 8.7   Anesthesia Other Findings   Reproductive/Obstetrics negative OB ROS                         Anesthesia Physical Anesthesia Plan  ASA: III  Anesthesia Plan: General   Post-op Pain Management:    Induction: Intravenous  Airway Management Planned: LMA  Additional Equipment:   Intra-op Plan:   Post-operative Plan: Extubation in OR  Informed Consent: I have reviewed the patients History and Physical, chart, labs and discussed the procedure including the risks, benefits and alternatives for the  proposed anesthesia with the patient or authorized representative who has indicated his/her understanding and acceptance.   Dental advisory given  Plan Discussed with: CRNA  Anesthesia Plan Comments:         Anesthesia Quick Evaluation

## 2013-05-31 ENCOUNTER — Encounter (HOSPITAL_COMMUNITY): Payer: Self-pay | Admitting: Urology

## 2013-05-31 DIAGNOSIS — I517 Cardiomegaly: Secondary | ICD-10-CM

## 2013-05-31 HISTORY — PX: TRANSTHORACIC ECHOCARDIOGRAM: SHX275

## 2013-05-31 LAB — CBC
HEMATOCRIT: 27.5 % — AB (ref 39.0–52.0)
HEMOGLOBIN: 9.4 g/dL — AB (ref 13.0–17.0)
MCH: 33.3 pg (ref 26.0–34.0)
MCHC: 34.2 g/dL (ref 30.0–36.0)
MCV: 97.5 fL (ref 78.0–100.0)
Platelets: 148 10*3/uL — ABNORMAL LOW (ref 150–400)
RBC: 2.82 MIL/uL — ABNORMAL LOW (ref 4.22–5.81)
RDW: 13 % (ref 11.5–15.5)
WBC: 14.6 10*3/uL — ABNORMAL HIGH (ref 4.0–10.5)

## 2013-05-31 LAB — BASIC METABOLIC PANEL
BUN: 50 mg/dL — ABNORMAL HIGH (ref 6–23)
CO2: 19 meq/L (ref 19–32)
Calcium: 8.5 mg/dL (ref 8.4–10.5)
Chloride: 105 mEq/L (ref 96–112)
Creatinine, Ser: 3.37 mg/dL — ABNORMAL HIGH (ref 0.50–1.35)
GFR calc Af Amer: 19 mL/min — ABNORMAL LOW (ref >90)
GFR calc non Af Amer: 16 mL/min — ABNORMAL LOW (ref >90)
GLUCOSE: 122 mg/dL — AB (ref 70–99)
POTASSIUM: 4.1 meq/L (ref 3.7–5.3)
SODIUM: 140 meq/L (ref 137–147)

## 2013-05-31 LAB — PSA: PSA: 2.59 ng/mL (ref <=4.00)

## 2013-05-31 NOTE — Care Management Note (Unsigned)
    Page 1 of 1   05/31/2013     12:05:48 PM CARE MANAGEMENT NOTE 05/31/2013  Patient:  [name], [name]   Account Number:  [id number]  Date Initiated:  05/31/2013  Documentation initiated by:  Timmie Dugue  Subjective/Objective Assessment:   Pt adm on 5/15 with ARF, bladder mass.  PTA,  pt resides at home with spouse.     Action/Plan:   Will follow for dc needs as pt progresses.   Anticipated DC Date:  06/01/2013   Anticipated DC Plan:  [place]  CM consult      Choice offered to / List presented to:             Status of service:  In process, will continue to follow Medicare Important Message given?   (If response is "NO", the following Medicare IM given date fields will be blank) Date Medicare IM given:   Date Additional Medicare IM given:    Discharge Disposition:    Per UR Regulation:  Reviewed for med. necessity/level of care/duration of stay  If discussed at [place] of Stay Meetings, dates discussed:    Comments:

## 2013-05-31 NOTE — Op Note (Signed)
NAME:  [name] NO.:  [id number]  MEDICAL RECORD NO.:  [id number]  LOCATION:  [id number]                        FACILITY:  [place]  PHYSICIAN:  Alexis Frock, MD     DATE OF BIRTH:  1931/03/07  DATE OF PROCEDURE:  05/30/2013 DATE OF DISCHARGE:                              OPERATIVE REPORT   DIAGNOSES:  Bilateral hydronephrosis, acute renal failure, history of prostate cancer, bladder mass.  PROCEDURE: 1. Cystoscopy, bilateral retrograde pyelogram interpretation. 2. Insertion of bilateral ureteral stents, 6 x 26, no tether. 3. Transurethral resection of bladder tumor, volume medium.  ESTIMATED BLOOD LOSS:  Nil.  COMPLICATIONS:  None.  SPECIMEN:  Area of trigone/bladder neck/bladder base mass fragments for permanent Pathology.  FINDINGS: 1. Nodular mass appearance to the area of the trigone and bladder neck     completely involving bilateral ureteral orifices,     worrisome for possible malignancy with malignant ureteral     obstruction. 2. Bilateral severe hydroureteronephrosis to the level of the     ureterovesical junction. 3. Successful unroofing of bilateral ureteral orifices with resection     of bladder neck area.   INDICATION:  [name] is a 80 year old gentleman with history of localized prostate cancer, status post brachytherapy.  He has had excellent biochemical control for a number of years.  He recently had a  modest PSA rise.  However, he presented to the emergency room after it was noted that he was in acute renal failure with a creatinine of 4 from baseline of less than 1.5 previously.  Imaging revealed bilateral hydronephrosis down to the level of the ureterovesical junction where there is thickened nodular tissue worrisome for possible neoplasm.  The patient also failed to have significant GFR recovery with a trial of Foley catheter alone.  Options were discussed including continued conservative measures versus bilateral  nephrostomy versus attempted transurethral endoscopic management with insertion of bilateral stents, possible resection of bladder neck mass, and the family wished to proceed with the latter. Informed consent was obtained and placed in the medical record.  PROCEDURE IN DETAIL:  The patient being [name] was verified. Procedure being transurethral resection of bladder tumor and bilateral stent placement confirmed.  Procedure was carried out.  Time-out was performed.  Intravenous antibiotics were administered.  General LMA anesthesia was introduced.  The patient was placed into a low lithotomy position.  Sterile field was created by prepping and draping the patient's penis, perineum, and proximal thighs using iodine x3.  Next, cystourethroscopy was performed using a 22-French rigid cystoscope with a 30-degree offset lens.  Inspection of the anterior and posterior urethra were unremarkable.  Inspection of the urinary bladder revealed a nodular appearance to the entire trigone extending towards the area of the prostate base.  Ureteral orifices were not readily visible and this process appeared to involve the orifices proper.  Multiple attempts were made to try to cannulate the native ureteral orifices on each side using multiple angulations with a KMP catheter.  Straight-tip wire and angled- tip wire, and ureteral orifices were unable to be cannulated on either side.  As such, decision was made to proceed with the resection of the trigone and  nodular area for purposes of tissue diagnosis and to hopefully unroof the bilateral ureters.  As such, the cystoscope was exchanged for a 28-French continuous-flow resectoscope sheath with medium loop and very careful resection was performed of this area of tissue, which did have a consistency worrisome for possible prostate tissue.  These fragments were set aside, labeled the area of the bladder neck, prostate base mass, history of prostate  cancer.  Additional coagulation current was given.  Following these maneuvers, very slight fold was seen in the location of ureteral orifices was uncovered on each side.  First on the left side, the KMP catheter was once again used, angled-tip glidewire, and indeed this was found to represent the left ureteral orifice.  Retrograde pyelogram was then obtained.  Left retrograde pyelogram demonstrated a single left ureter, a single system left kidney.  There was large hydroureteronephrosis to the level of the ureterovesical junction.  A 0.038 Glidewire was once again advanced at the level of the upper pole, over which a new 6 x 26 Contour stent was placed.  Good proximal and distal deployment were noted. Attention was then directed at the right side.  The right ureteral orifice was also similarly identified and cannulated at this time using a straight tip wire and KMP catheter which provided the proper angulation to the right ureteral orifice, and right retrograde pyelogram was seen.  Right retrograde pyelogram demonstrated a single right ureter, a single system right kidney.  There was large hydroureteronephrosis to the level of the ureterovesical junction.  The Sensor wire was exchanged for a 0.038 Glidewire to the level of the upper pole over which a new 6 x 26 stent was placed on the right side.  Good proximal and distal deployment were noted.  Following these maneuvers with excellent hemostasis, there was no evidence of perforation.  Stents were in adequate position, and given the patient still had acute renal failure, it was felt that Foley catheter was warranted as such the resectoscope was exchanged for a new 16-French Foley catheter.  The 10 mL of sterile water in the balloon. This connected to straight drain.  Procedure was terminated.  The patient tolerated the procedure well.  There were no immediate periprocedural complications.  The patient was taken to postanesthesia care  unit in stable condition.          ______________________________ Alexis Frock, MD     TM/MEDQ  D:  05/30/2013  T:  05/30/2013  Job:  [id number]

## 2013-05-31 NOTE — Anesthesia Postprocedure Evaluation (Signed)
  Anesthesia Post-op Note  Patient: [name]  Procedure(s) Performed: Procedure(s) (LRB): CYSTOSCOPY WITH RETROGRADE PYELOGRAM/ BILATERAL URETERAL STENT PLACEMENT, TRANSURETRAL RESECTION OF BLADDER TUMOR WITH GYRUS (Bilateral)  Patient Location: PACU  Anesthesia Type: General  Level of Consciousness: awake and alert   Airway and Oxygen Therapy: Patient Spontanous Breathing  Post-op Pain: mild  Post-op Assessment: Post-op Vital signs reviewed, Patient's Cardiovascular Status Stable, Respiratory Function Stable, Patent Airway and No signs of Nausea or vomiting  Last Vitals:  Filed Vitals:   05/31/13 0427  BP: 125/62  Pulse: 78  Temp: 36.7 C  Resp: 18    Post-op Vital Signs: stable   Complications: No apparent anesthesia complications

## 2013-05-31 NOTE — Progress Notes (Signed)
Pt ambulated in hallway 662ft. Pt tolerated walk well. Monitoring will continue.

## 2013-05-31 NOTE — Progress Notes (Signed)
Pt ambulated in hallway 364ft. Pt tolerated walk well monitoring will continue.

## 2013-05-31 NOTE — Progress Notes (Signed)
  Echocardiogram 2D Echocardiogram has been performed.  Donata Clay 05/31/2013, 10:22 AM

## 2013-05-31 NOTE — Progress Notes (Signed)
1 Day Post-Op  Subjective:  1 - Acute Renal Failure With Hydronephrosis - Baseline Cr < 1.3 2014, noted acute rise >4 with hyperkalemia to 6.0. Renal US 5/16 with bilateral hydro to bladder that is non-distended. Has had foley for >24 hrs without significant improvement in Cr. CT 5/16 with hydro down to very thickened bladder neck worrisome for possible neoplasm. S/p tranurethral resection of trigone / ureteral unroofing / bilateral stent placement 5/17, path pending.   2 - Prostate Cancer - s/p primary brachytherapy 2005 for Gleason 6 disease with excellent biochemical control until 2014 when slow rise in PSA, now most recently 2.89 04/2013.   Today [place] is without compalints. Vigorous diuresis via foley past 24 hrs (>3L). Cr starting to improve, now low 3's.   Objective: Vital signs in last 24 hours: Temp:  [97.6 F (36.4 C)-98 F (36.7 C)] 97.6 F (36.4 C) (05/18 1428) Pulse Rate:  [69-88] 69 (05/18 1428) Resp:  [16-18] 18 (05/18 1428) BP: (101-148)/(51-87) 101/51 mmHg (05/18 1428) SpO2:  [96 %-100 %] 100 % (05/18 1428) Last BM Date: 05/30/13  Intake/Output from previous day: 05/17 0701 - 05/18 0700 In: 1390 [P.O.:240; I.V.:1150] Out: 3250 [Urine:3250] Intake/Output this shift: Total I/O In: 960 [P.O.:960] Out: -   General appearance: alert, cooperative and appears stated age Head: Normocephalic, without obvious abnormality, atraumatic Eyes: conjunctivae/corneas clear. PERRL, EOM's intact. Fundi benign. Throat: lips, mucosa, and tongue normal; teeth and gums normal Neck: supple, symmetrical, trachea midline Back: symmetric, no curvature. ROM normal. No CVA tenderness. Resp: no labored breathing Chest wall: no tenderness Cardio: Nl rate GI: soft, non-tender; bowel sounds normal; no masses,  no organomegaly Male genitalia: normal, foley c/d/i with thin, dark urine, no clots.  Extremities: extremities normal, atraumatic, no cyanosis or edema Pulses: 2+ and symmetric Skin:  Skin color, texture, turgor normal. No rashes or lesions Lymph nodes: Cervical, supraclavicular, and axillary nodes normal. Neurologic: Grossly normal  Lab Results:   Recent Labs  05/29/13 0905 05/31/13 0410  WBC 6.5 14.6*  HGB 8.7* 9.4*  HCT 25.8* 27.5*  PLT 144* 148*   BMET  Recent Labs  05/30/13 0755 05/31/13 0410  NA 142 140  K 4.2 4.1  CL 106 105  CO2 19 19  GLUCOSE 98 122*  BUN 59* 50*  CREATININE 4.59* 3.37*  CALCIUM 8.5 8.5   PT/INR No results found for this basename: LABPROT, INR,  in the last 72 hours ABG No results found for this basename: PHART, PCO2, PO2, HCO3,  in the last 72 hours  Studies/Results: Ct Abdomen Pelvis Wo Contrast  05/30/2013   CLINICAL DATA:  Acute renal failure. Bilateral hydronephrosis at recent ultrasound.  EXAM: CT ABDOMEN AND PELVIS WITHOUT CONTRAST  TECHNIQUE: Multidetector CT imaging of the abdomen and pelvis was performed following the standard protocol without IV contrast.  COMPARISON:  Renal ultrasound obtained yesterday.  FINDINGS: Again demonstrated is moderate dilatation of both renal collecting systems and ureters. The ureters are dilated to the level of the urinary bladder. There is moderate diffuse bladder wall thickening. Foley catheter in the bladder. No urinary tract calculi are seen. Prostate radiation seed implants are demonstrated with no enlargement of the prostate gland. Small bilateral inguinal hernias containing fat.  Small bilateral pleural effusions. Mild bilateral lower lobe atelectasis. Mild patchy opacity in the lingula. Multiple small calcified granulomata in the liver and spleen. Unremarkable non contrasted appearance of the pancreas, gallbladder and adrenal glands. Small amount of free peritoneal fluid in the inferior pelvis. This  is slightly higher in density than simple fluid, measuring between 8 and 13 Hounsfield units.  Minimal sigmoid diverticulosis without evidence of diverticulitis. No enlarged lymph nodes.  Mildly enlarged heart. Lumbar lower thoracic spine degenerative changes. No evidence of bony metastatic disease.  IMPRESSION: 1. Moderate bilateral hydronephrosis and hydroureter to the level of a decompressed urinary bladder with moderate diffuse wall thickening. This is concerning for the possibility of a bladder neoplasm causing ureteral obstruction. Recommend cystoscopy. 2. Small amount of minimally complex free peritoneal fluid in the inferior pelvis. 3. Mild cardiomegaly. 4. Small bilateral pleural effusions and mild bilateral lower lobe atelectasis. 5. Mild patchy scarring or atelectasis in the lingula. 6. Small bilateral inguinal hernias containing fat.   Electronically Signed   By: Enrique Sack M.D.   On: 05/30/2013 01:27    Anti-infectives: Anti-infectives   Start     Dose/Rate Route Frequency Ordered Stop   05/30/13 1115  cefTRIAXone (ROCEPHIN) 1 g in dextrose 5 % 50 mL IVPB  Status:  Discontinued     1 g 100 mL/hr over 30 Minutes Intravenous On call to O.R. 05/30/13 1109 05/30/13 1121      Assessment/Plan:  1 - Acute Renal Failure With Hydronephrosis - likely some obstructive component from trigone mass, should improve s/p stenting. Expected diuresis is encouraging. Path pending.   2 - Prostate Cancer - further management pending path results.   Alexis Frock 05/31/2013

## 2013-05-31 NOTE — Progress Notes (Signed)
[facility] PROGRESS NOTE  [name] [id number]:[id number] DOB: February 16, 1931 DOA: 05/28/2013 PCP: [name], MD  Assessment/Plan: Acute renal failure -spoke to urology he had cystoscopy done and this shows possible tumor obstructing the ureters -await pathology -appreciate urology input  Atrial fibrillation -right now rate controlled -monitor rate continue beta blockers -holding xarelto  Anemia -likely chronic disease  Cough -appears to be more consistent with asthmatic cough -started on dulera -CXR shows COPD  Hyperkalemia -improved -monitor labs  Leukocytosis -monitor   Code Status: Full Code Family Communication: no family at bedside Disposition Plan: Home   Consultants:  Urology ordered  Nephrology ordered  Procedures:  Ultrasound of Kidneys  Antibiotics:  None  HPI/Subjective: 42 yow male admitted with worsening of creatinine from baseline of 1.2 most recently. On ultrasound has an obstructive uropathy. He does have history of prostate issues in the past treated by urology. Went for cystoscopy and had resection of tumor.  Doing well today, no CP, no SOB  Objective: Filed Vitals:   05/31/13 0427  BP: 125/62  Pulse: 78  Temp: 98 F (36.7 C)  Resp: 18    Intake/Output Summary (Last 24 hours) at 05/31/13 1123 Last data filed at 05/31/13 0900  Gross per 24 hour  Intake   1630 ml  Output   3250 ml  Net  -1620 ml   Filed Weights   05/28/13 1939 05/29/13 0216  Weight: 79.379 kg (175 lb) 78.3 kg (172 lb 9.9 oz)    Exam:   General:  Awake and alert no distress  Cardiovascular: RRR no gallop or rubs  Respiratory: good air entry no rales noted  Abdomen: soft non-tender  Musculoskeletal: No active synovitis  Data Reviewed: Basic Metabolic Panel:  Recent Labs Lab 05/29/13 0320 05/29/13 0905 05/29/13 1520 05/29/13 2009 05/30/13 0755 05/31/13 0410  NA 140 141 135* 141 142 140  K 5.0 4.7 4.4 4.4 4.2 4.1  CL 107  105 102 105 106 105  CO2 19 17* 18* 19 19 19   GLUCOSE 78 137* 108* 116* 98 122*  BUN 68* 65* 63* 62* 59* 50*  CREATININE 4.53* 4.46* 4.56* 4.70* 4.59* 3.37*  CALCIUM 9.2 9.0 8.4 8.8 8.5 8.5  MG  --  1.6  --   --   --   --   PHOS  --  4.2  --   --   --   --    Liver Function Tests:  Recent Labs Lab 05/28/13 1946 05/29/13 0905  AST 17 15  ALT 10 10  ALKPHOS 69 61  BILITOT 0.4 0.3  PROT 7.3 6.6  ALBUMIN 4.0 3.5   No results found for this basename: LIPASE, AMYLASE,  in the last 168 hours No results found for this basename: AMMONIA,  in the last 168 hours CBC:  Recent Labs Lab 05/28/13 1946 05/29/13 0905 05/31/13 0410  WBC 5.9 6.5 14.6*  NEUTROABS 4.2  --   --   HGB 9.7* 8.7* 9.4*  HCT 28.6* 25.8* 27.5*  MCV 98.6 98.1 97.5  PLT 181 144* 148*   Cardiac Enzymes: No results found for this basename: CKTOTAL, CKMB, CKMBINDEX, TROPONINI,  in the last 168 hours BNP (last 3 results) No results found for this basename: PROBNP,  in the last 8760 hours CBG:  Recent Labs Lab 05/29/13 0244 05/29/13 0324  GLUCAP 47* 100*    No results found for this or any previous visit (from the past 240 hour(s)).   Studies: Ct Abdomen Pelvis Wo Contrast  05/30/2013   CLINICAL DATA:  Acute renal failure. Bilateral hydronephrosis at recent ultrasound.  EXAM: CT ABDOMEN AND PELVIS WITHOUT CONTRAST  TECHNIQUE: Multidetector CT imaging of the abdomen and pelvis was performed following the standard protocol without IV contrast.  COMPARISON:  Renal ultrasound obtained yesterday.  FINDINGS: Again demonstrated is moderate dilatation of both renal collecting systems and ureters. The ureters are dilated to the level of the urinary bladder. There is moderate diffuse bladder wall thickening. Foley catheter in the bladder. No urinary tract calculi are seen. Prostate radiation seed implants are demonstrated with no enlargement of the prostate gland. Small bilateral inguinal hernias containing fat.  Small  bilateral pleural effusions. Mild bilateral lower lobe atelectasis. Mild patchy opacity in the lingula. Multiple small calcified granulomata in the liver and spleen. Unremarkable non contrasted appearance of the pancreas, gallbladder and adrenal glands. Small amount of free peritoneal fluid in the inferior pelvis. This is slightly higher in density than simple fluid, measuring between 8 and 13 Hounsfield units.  Minimal sigmoid diverticulosis without evidence of diverticulitis. No enlarged lymph nodes. Mildly enlarged heart. Lumbar lower thoracic spine degenerative changes. No evidence of bony metastatic disease.  IMPRESSION: 1. Moderate bilateral hydronephrosis and hydroureter to the level of a decompressed urinary bladder with moderate diffuse wall thickening. This is concerning for the possibility of a bladder neoplasm causing ureteral obstruction. Recommend cystoscopy. 2. Small amount of minimally complex free peritoneal fluid in the inferior pelvis. 3. Mild cardiomegaly. 4. Small bilateral pleural effusions and mild bilateral lower lobe atelectasis. 5. Mild patchy scarring or atelectasis in the lingula. 6. Small bilateral inguinal hernias containing fat.   Electronically Signed   By: Enrique Sack M.D.   On: 05/30/2013 01:27    Scheduled Meds: . darifenacin  15 mg Oral Daily  . docusate sodium  100 mg Oral BID  . levothyroxine  137 mcg Oral QAC breakfast  . mometasone-formoterol  2 puff Inhalation BID  . propranolol ER  60 mg Oral Daily  . sodium chloride  3 mL Intravenous Q12H  . tamsulosin  0.4 mg Oral Daily  . tiotropium  18 mcg Inhalation Daily   Continuous Infusions:   Active Problems:   ATRIAL FIBRILLATION-paroxysmal   Pacemaker-dual-chamber-[employer]   Acute renal failure   Anemia   ARF (acute renal failure)   Hyperkalemia    Time spent: 46min    Geradine Girt  [facility] Pager [phone] If 7PM-7AM, please contact night-coverage at [email or web address], password  [facility] 05/31/2013, 11:23 AM  LOS: 3 days

## 2013-06-01 ENCOUNTER — Inpatient Hospital Stay: Admission: RE | Admit: 2013-06-01 | Payer: Medicare Other | Source: Ambulatory Visit

## 2013-06-01 ENCOUNTER — Encounter: Payer: Self-pay | Admitting: Internal Medicine

## 2013-06-01 DIAGNOSIS — N3289 Other specified disorders of bladder: Secondary | ICD-10-CM

## 2013-06-01 LAB — BASIC METABOLIC PANEL
BUN: 46 mg/dL — ABNORMAL HIGH (ref 6–23)
CALCIUM: 8.2 mg/dL — AB (ref 8.4–10.5)
CO2: 23 mEq/L (ref 19–32)
CREATININE: 2.62 mg/dL — AB (ref 0.50–1.35)
Chloride: 107 mEq/L (ref 96–112)
GFR calc Af Amer: 25 mL/min — ABNORMAL LOW (ref >90)
GFR calc non Af Amer: 22 mL/min — ABNORMAL LOW (ref >90)
GLUCOSE: 89 mg/dL (ref 70–99)
Potassium: 3.5 mEq/L — ABNORMAL LOW (ref 3.7–5.3)
Sodium: 142 mEq/L (ref 137–147)

## 2013-06-01 LAB — CBC
HCT: 25.3 % — ABNORMAL LOW (ref 39.0–52.0)
HEMOGLOBIN: 8.8 g/dL — AB (ref 13.0–17.0)
MCH: 34 pg (ref 26.0–34.0)
MCHC: 34.8 g/dL (ref 30.0–36.0)
MCV: 97.7 fL (ref 78.0–100.0)
Platelets: 153 10*3/uL (ref 150–400)
RBC: 2.59 MIL/uL — ABNORMAL LOW (ref 4.22–5.81)
RDW: 13.2 % (ref 11.5–15.5)
WBC: 7.9 10*3/uL (ref 4.0–10.5)

## 2013-06-01 MED ORDER — HEPARIN SODIUM (PORCINE) 5000 UNIT/ML IJ SOLN
5000.0000 [IU] | Freq: Three times a day (TID) | INTRAMUSCULAR | Status: DC
  Administered 2013-06-01 – 2013-06-04 (×9): 5000 [IU] via SUBCUTANEOUS
  Filled 2013-06-01 (×12): qty 1

## 2013-06-01 MED ORDER — POTASSIUM CHLORIDE CRYS ER 20 MEQ PO TBCR
40.0000 meq | EXTENDED_RELEASE_TABLET | Freq: Once | ORAL | Status: AC
  Administered 2013-06-01: 40 meq via ORAL
  Filled 2013-06-01: qty 2

## 2013-06-01 NOTE — Progress Notes (Signed)
2 Days Post-Op Subjective: Patient reports that he is feeling well. He has had no problems with his catheter.  Objective: Vital signs in last 24 hours: Temp:  [97.6 F (36.4 C)-98.1 F (36.7 C)] 98.1 F (36.7 C) (05/19 0423) Pulse Rate:  [69-83] 71 (05/19 0423) Resp:  [17-18] 17 (05/19 0423) BP: (101-131)/(51-71) 116/71 mmHg (05/19 0423) SpO2:  [96 %-100 %] 96 % (05/19 0423)  Intake/Output from previous day: 05/18 0701 - 05/19 0700 In: 1200 [P.O.:1200] Out: 3350 [Urine:3350] Intake/Output this shift: Total I/O In: -  Out: 1800 [Urine:1800]  Physical Exam:  Constitutional: Vital signs reviewed. WD WN in NAD   Eyes: PERRL, No scleral icterus.   Pulmonary/Chest: Normal effort: Extremities: No cyanosis or edema   Urine is tea colored, without clots. Not grossly bloody. Lab Results:  Recent Labs  05/29/13 0905 05/31/13 0410 06/01/13 0505  HGB 8.7* 9.4* 8.8*  HCT 25.8* 27.5* 25.3*   BMET  Recent Labs  05/30/13 0755 05/31/13 0410  NA 142 140  K 4.2 4.1  CL 106 105  CO2 19 19  GLUCOSE 98 122*  BUN 59* 50*  CREATININE 4.59* 3.37*  CALCIUM 8.5 8.5   No results found for this basename: LABPT, INR,  in the last 72 hours No results found for this basename: LABURIN,  in the last 72 hours Results for orders placed during the hospital encounter of 12/14/10  SURGICAL PCR SCREEN     Status: None   Collection Time    12/14/10 11:36 AM      Result Value Ref Range Status   MRSA, PCR NEGATIVE  NEGATIVE Final   Staphylococcus aureus NEGATIVE  NEGATIVE Final   Comment:            The Xpert SA Assay (FDA     approved for NASAL specimens     only), is one component of     a comprehensive surveillance     program.  It is not intended     to diagnose infection nor to     guide or monitor treatment.    Studies/Results: No results found.  Assessment/Plan:   Postoperative day #2 following transurethral resection of trigone/bladder neck. His urine is clearing  Acute  renal insufficiency, improving and status post stenting.  I have ordered his catheter to be removed for voiding trial. Await pathology, I will call with results. I have discussed initiating androgen depravation with him. Once recurrent cancer has been documented, I think that is the appropriate step.   LOS: 4 days   Jorja Loa 06/01/2013, 6:28 AM

## 2013-06-01 NOTE — Progress Notes (Signed)
[facility] PROGRESS NOTE  [name] KYH:[id number] DOB: November 23, 1931 DOA: 05/28/2013 PCP: [name], MD  Assessment/Plan: Acute renal failure -urology he had cystoscopy done and this shows possible tumor obstructing the ureters -await pathology -appreciate urology input - foley d/c'd  Atrial fibrillation -right now rate controlled -monitor rate continue beta blockers -holding xarelto- may resume if Cr returns to baseline- for now SQ heparin and monitor for bleeding  Anemia -likely chronic disease  Cough -appears to be more consistent with asthmatic cough -started on dulera -CXR shows COPD  Hyperkalemia -improved -monitor labs  Leukocytosis -monitor   Code Status: Full Code Family Communication: no family at bedside Disposition Plan: Home   Consultants:  Urology ordered  Nephrology ordered  Procedures:  Ultrasound of Kidneys  Antibiotics:  None  HPI/Subjective: 32 yow male admitted with worsening of creatinine from baseline of 1.2 most recently. On ultrasound has an obstructive uropathy. He does have history of prostate issues in the past treated by urology. Went for cystoscopy and had resection of tumor.  Has been up walking around Foley removed today  Objective: Filed Vitals:   06/01/13 0423  BP: 116/71  Pulse: 71  Temp: 98.1 F (36.7 C)  Resp: 17    Intake/Output Summary (Last 24 hours) at 06/01/13 1045 Last data filed at 06/01/13 0915  Gross per 24 hour  Intake   1200 ml  Output   4150 ml  Net  -2950 ml   Filed Weights   05/28/13 1939 05/29/13 0216  Weight: 79.379 kg (175 lb) 78.3 kg (172 lb 9.9 oz)    Exam:   General:  Awake and alert no distress  Cardiovascular: RRR no gallop or rubs  Respiratory: good air entry no rales noted  Abdomen: soft non-tender  Musculoskeletal: no edema  Data Reviewed: Basic Metabolic Panel:  Recent Labs Lab 05/29/13 0320 05/29/13 0905 05/29/13 1520 05/29/13 2009  05/30/13 0755 05/31/13 0410 06/01/13 0505  NA 140 141 135* 141 142 140 142  K 5.0 4.7 4.4 4.4 4.2 4.1 3.5*  CL 107 105 102 105 106 105 107  CO2 19 17* 18* 19 19 19 23   GLUCOSE 78 137* 108* 116* 98 122* 89  BUN 68* 65* 63* 62* 59* 50* 46*  CREATININE 4.53* 4.46* 4.56* 4.70* 4.59* 3.37* 2.62*  CALCIUM 9.2 9.0 8.4 8.8 8.5 8.5 8.2*  MG  --  1.6  --   --   --   --   --   PHOS  --  4.2  --   --   --   --   --    Liver Function Tests:  Recent Labs Lab 05/28/13 1946 05/29/13 0905  AST 17 15  ALT 10 10  ALKPHOS 69 61  BILITOT 0.4 0.3  PROT 7.3 6.6  ALBUMIN 4.0 3.5   No results found for this basename: LIPASE, AMYLASE,  in the last 168 hours No results found for this basename: AMMONIA,  in the last 168 hours CBC:  Recent Labs Lab 05/28/13 1946 05/29/13 0905 05/31/13 0410 06/01/13 0505  WBC 5.9 6.5 14.6* 7.9  NEUTROABS 4.2  --   --   --   HGB 9.7* 8.7* 9.4* 8.8*  HCT 28.6* 25.8* 27.5* 25.3*  MCV 98.6 98.1 97.5 97.7  PLT 181 144* 148* 153   Cardiac Enzymes: No results found for this basename: CKTOTAL, CKMB, CKMBINDEX, TROPONINI,  in the last 168 hours BNP (last 3 results) No results found for this basename: PROBNP,  in  the last 8760 hours CBG:  Recent Labs Lab 05/29/13 0244 05/29/13 0324  GLUCAP 47* 100*    No results found for this or any previous visit (from the past 240 hour(s)).   Studies: No results found.  Scheduled Meds: . darifenacin  15 mg Oral Daily  . docusate sodium  100 mg Oral BID  . levothyroxine  137 mcg Oral QAC breakfast  . mometasone-formoterol  2 puff Inhalation BID  . propranolol ER  60 mg Oral Daily  . sodium chloride  3 mL Intravenous Q12H  . tamsulosin  0.4 mg Oral Daily  . tiotropium  18 mcg Inhalation Daily   Continuous Infusions:   Active Problems:   ATRIAL FIBRILLATION-paroxysmal   Pacemaker-dual-chamber-[employer]   Acute renal failure   Anemia   ARF (acute renal failure)   Hyperkalemia    Time spent: 25  min    Geradine Girt  [facility] Pager [phone] If 7PM-7AM, please contact night-coverage at [email or web address], password [facility] 06/01/2013, 10:45 AM  LOS: 4 days

## 2013-06-02 DIAGNOSIS — K5900 Constipation, unspecified: Secondary | ICD-10-CM | POA: Diagnosis not present

## 2013-06-02 DIAGNOSIS — C61 Malignant neoplasm of prostate: Principal | ICD-10-CM | POA: Diagnosis present

## 2013-06-02 LAB — URINALYSIS, ROUTINE W REFLEX MICROSCOPIC
Bilirubin Urine: NEGATIVE
GLUCOSE, UA: NEGATIVE mg/dL
Ketones, ur: NEGATIVE mg/dL
Nitrite: NEGATIVE
PROTEIN: 100 mg/dL — AB
Specific Gravity, Urine: 1.016 (ref 1.005–1.030)
Urobilinogen, UA: 1 mg/dL (ref 0.0–1.0)
pH: 5.5 (ref 5.0–8.0)

## 2013-06-02 LAB — URINE MICROSCOPIC-ADD ON

## 2013-06-02 MED ORDER — POTASSIUM CHLORIDE CRYS ER 20 MEQ PO TBCR
40.0000 meq | EXTENDED_RELEASE_TABLET | Freq: Once | ORAL | Status: AC
  Administered 2013-06-02: 40 meq via ORAL
  Filled 2013-06-02: qty 2

## 2013-06-02 MED ORDER — BISACODYL 10 MG RE SUPP: 10.0000 mg | Freq: Every day | RECTAL | Status: DC | PRN

## 2013-06-02 MED ORDER — SODIUM CHLORIDE 0.9 % IV SOLN
INTRAVENOUS | Status: DC
  Administered 2013-06-02 (×2): via INTRAVENOUS
  Administered 2013-06-03 – 2013-06-04 (×2): 125 mL/h via INTRAVENOUS
  Administered 2013-06-04: 11:00:00 via INTRAVENOUS

## 2013-06-02 MED ORDER — DEGARELIX ACETATE 120 MG ~~LOC~~ SOLR
240.0000 mg | Freq: Once | SUBCUTANEOUS | Status: AC
  Administered 2013-06-03: 240 mg via SUBCUTANEOUS
  Filled 2013-06-02: qty 6

## 2013-06-02 MED ORDER — FLEET ENEMA 7-19 GM/118ML RE ENEM
1.0000 | ENEMA | Freq: Every day | RECTAL | Status: DC | PRN
  Filled 2013-06-02: qty 1

## 2013-06-02 MED ORDER — BISACODYL 5 MG PO TBEC
10.0000 mg | DELAYED_RELEASE_TABLET | Freq: Every day | ORAL | Status: DC
  Administered 2013-06-02 – 2013-06-03 (×2): 10 mg via ORAL
  Filled 2013-06-02 (×2): qty 2

## 2013-06-02 NOTE — Progress Notes (Signed)
3 Days Post-Op Subjective: Patient reports frequency, urgency and mild dysuria. History was fairly slow.  Objective: Vital signs in last 24 hours: Temp:  [97.8 F (36.6 C)-98 F (36.7 C)] 97.8 F (36.6 C) (05/20 1936) Pulse Rate:  [60-107] 76 (05/20 1936) Resp:  [17-18] 17 (05/20 1936) BP: (105-125)/(60-81) 106/81 mmHg (05/20 1936) SpO2:  [99 %-100 %] 100 % (05/20 1936)  Intake/Output from previous day: 05/19 0701 - 05/20 0700 In: 720 [P.O.:720] Out: 1233 [Urine:1233] Intake/Output this shift:    Physical Exam:  Constitutional: Vital signs reviewed. WD WN in NAD   Eyes: PERRL, No scleral icterus.   Pulmonary/Chest: Normal effort  Urine is still slightly bloody.  Lab Results:  Recent Labs  05/31/13 0410 06/01/13 0505  HGB 9.4* 8.8*  HCT 27.5* 25.3*   BMET  Recent Labs  05/31/13 0410 06/01/13 0505  NA 140 142  K 4.1 3.5*  CL 105 107  CO2 19 23  GLUCOSE 122* 89  BUN 50* 46*  CREATININE 3.37* 2.62*  CALCIUM 8.5 8.2*   No results found for this basename: LABPT, INR,  in the last 72 hours No results found for this basename: LABURIN,  in the last 72 hours Results for orders placed during the hospital encounter of 12/14/10  SURGICAL PCR SCREEN     Status: None   Collection Time    12/14/10 11:36 AM      Result Value Ref Range Status   MRSA, PCR NEGATIVE  NEGATIVE Final   Staphylococcus aureus NEGATIVE  NEGATIVE Final   Comment:            The Xpert SA Assay (FDA     approved for NASAL specimens     only), is one component of     a comprehensive surveillance     program.  It is not intended     to diagnose infection nor to     guide or monitor treatment.    Studies/Results: I reviewed the patient's pathology with -- 40% of resected tissue revealed a Gleason 4+5 equal Pattern Assessment/Plan:   Status post cystoscopy, stenting, transurethral resection of bladder neck tissue for obstructing prostate. Creatinine is responding. He is having some  voiding issues. He has recurrent cancer in his resected tissue.    I discussed pathology results with Mr. Willey Blade. I think it worthwhile to perform a bone scan, and start him on androgen deprivation with [place]. We talked about risks and benefits of this treatment. I will also check a residual urine volume after his next few voids. If he is emptying out, it may be worthwhile giving him an antimuscarinic. If he is not emptying out well I think we need to temporarily replace the catheter.   LOS: 5 days   Jorja Loa 06/02/2013, 8:10 PM

## 2013-06-02 NOTE — Progress Notes (Addendum)
Chart reviewed.  [facility] PROGRESS NOTE  [name] QIW:[id number] DOB: 29-Dec-1931 DOA: 05/28/2013 PCP: [name], MD  Assessment/Plan: Acute renal failure, postobstructive s/p1. Cystoscopy, bilateral retrograde pyelogram interpretation.  2. Insertion of bilateral ureteral stents, 6 x 26, no tether.  3. Transurethral resection of bladder tumor, volume medium. Slowly improving, but not back to baseline. Will give saline and recheck  Prostate cancer recurrence. Shared diagnosis with patient. Will need antiandrogen therapy  Atrial fibrillation -right now rate controlled -monitor rate continue beta blockers -holding xarelto until renal function back to normal  Constipation: Laxatives.  Anemia -likely chronic disease  Cough -appears to be more consistent with asthmatic cough -started on dulera -CXR shows COPD  Hyperkalemia Resolved. Now hypokalemic.  Urinary urgency, frequency. Likely from recent instrumentation, but will repeat UA to r/o UTI. UA negative on admisiion   Code Status: Full Code Family Communication: wife Disposition Plan: Home   Consultants:  Urology   Antibiotics:  None  HPI/Subjective: C/o urinary urgency, frequency and dribbling. C/o constipation.  Objective: Filed Vitals:   06/02/13 1456  BP: 105/66  Pulse:   Temp:   Resp: 18    Intake/Output Summary (Last 24 hours) at 06/02/13 1525 Last data filed at 06/02/13 1300  Gross per 24 hour  Intake    720 ml  Output    107 ml  Net    613 ml   Filed Weights   05/28/13 1939 05/29/13 0216  Weight: 79.379 kg (175 lb) 78.3 kg (172 lb 9.9 oz)    Exam:   General:  Awake and alert no distress  Cardiovascular: RRR no gallop or rubs  Respiratory: good air entry no rales noted  Abdomen: soft non-tender Ext no CCE Data Reviewed: Basic Metabolic Panel:  Recent Labs Lab 05/29/13 0320 05/29/13 0905 05/29/13 1520 05/29/13 2009 05/30/13 0755 05/31/13 0410  06/01/13 0505  NA 140 141 135* 141 142 140 142  K 5.0 4.7 4.4 4.4 4.2 4.1 3.5*  CL 107 105 102 105 106 105 107  CO2 19 17* 18* 19 19 19 23   GLUCOSE 78 137* 108* 116* 98 122* 89  BUN 68* 65* 63* 62* 59* 50* 46*  CREATININE 4.53* 4.46* 4.56* 4.70* 4.59* 3.37* 2.62*  CALCIUM 9.2 9.0 8.4 8.8 8.5 8.5 8.2*  MG  --  1.6  --   --   --   --   --   PHOS  --  4.2  --   --   --   --   --    Liver Function Tests:  Recent Labs Lab 05/28/13 1946 05/29/13 0905  AST 17 15  ALT 10 10  ALKPHOS 69 61  BILITOT 0.4 0.3  PROT 7.3 6.6  ALBUMIN 4.0 3.5   No results found for this basename: LIPASE, AMYLASE,  in the last 168 hours No results found for this basename: AMMONIA,  in the last 168 hours CBC:  Recent Labs Lab 05/28/13 1946 05/29/13 0905 05/31/13 0410 06/01/13 0505  WBC 5.9 6.5 14.6* 7.9  NEUTROABS 4.2  --   --   --   HGB 9.7* 8.7* 9.4* 8.8*  HCT 28.6* 25.8* 27.5* 25.3*  MCV 98.6 98.1 97.5 97.7  PLT 181 144* 148* 153   Cardiac Enzymes: No results found for this basename: CKTOTAL, CKMB, CKMBINDEX, TROPONINI,  in the last 168 hours BNP (last 3 results) No results found for this basename: PROBNP,  in the last 8760 hours CBG:  Recent Labs Lab 05/29/13 0244  05/29/13 0324  GLUCAP 47* 100*    No results found for this or any previous visit (from the past 240 hour(s)).   Studies: No results found.  Scheduled Meds: . darifenacin  15 mg Oral Daily  . docusate sodium  100 mg Oral BID  . heparin subcutaneous  5,000 Units Subcutaneous 3 times per day  . levothyroxine  137 mcg Oral QAC breakfast  . mometasone-formoterol  2 puff Inhalation BID  . propranolol ER  60 mg Oral Daily  . sodium chloride  3 mL Intravenous Q12H  . tamsulosin  0.4 mg Oral Daily  . tiotropium  18 mcg Inhalation Daily   Continuous Infusions:   Time spent: 35 min  Delfina Redwood, MD  [facility] Pager [phone] If 7PM-7AM, please contact night-coverage at [email or web address], password  [facility] 06/02/2013, 3:25 PM  LOS: 5 days

## 2013-06-02 NOTE — Progress Notes (Signed)
Nursing note Patient ambulated in hallway 390 feet with walker gait fairly steady. Patient tolerated well will monitor patient. Bettina Gavia Reighlyn Elmes RN

## 2013-06-03 ENCOUNTER — Inpatient Hospital Stay (HOSPITAL_COMMUNITY): Payer: Medicare Other

## 2013-06-03 LAB — BASIC METABOLIC PANEL
BUN: 34 mg/dL — AB (ref 6–23)
BUN: 31 mg/dL — ABNORMAL HIGH (ref 6–23)
CALCIUM: 7.8 mg/dL — AB (ref 8.4–10.5)
CHLORIDE: 108 meq/L (ref 96–112)
CO2: 19 mEq/L (ref 19–32)
CO2: 20 meq/L (ref 19–32)
CREATININE: 1.88 mg/dL — AB (ref 0.50–1.35)
CREATININE: 1.75 mg/dL — AB (ref 0.50–1.35)
Calcium: 7.7 mg/dL — ABNORMAL LOW (ref 8.4–10.5)
Chloride: 107 mEq/L (ref 96–112)
GFR calc non Af Amer: 32 mL/min — ABNORMAL LOW (ref >90)
GFR calc non Af Amer: 35 mL/min — ABNORMAL LOW (ref >90)
GFR, EST AFRICAN AMERICAN: 38 mL/min — AB (ref >90)
GFR, EST AFRICAN AMERICAN: 41 mL/min — AB (ref >90)
Glucose, Bld: 93 mg/dL (ref 70–99)
Glucose, Bld: 104 mg/dL — ABNORMAL HIGH (ref 70–99)
POTASSIUM: 4.2 meq/L (ref 3.7–5.3)
Potassium: 4.1 mEq/L (ref 3.7–5.3)
Sodium: 140 mEq/L (ref 137–147)
Sodium: 139 mEq/L (ref 137–147)

## 2013-06-03 LAB — CBC
HEMATOCRIT: 27.8 % — AB (ref 39.0–52.0)
HEMOGLOBIN: 9.7 g/dL — AB (ref 13.0–17.0)
MCH: 34.2 pg — ABNORMAL HIGH (ref 26.0–34.0)
MCHC: 34.9 g/dL (ref 30.0–36.0)
MCV: 97.9 fL (ref 78.0–100.0)
Platelets: 174 10*3/uL (ref 150–400)
RBC: 2.84 MIL/uL — ABNORMAL LOW (ref 4.22–5.81)
RDW: 13.1 % (ref 11.5–15.5)
WBC: 5.1 10*3/uL (ref 4.0–10.5)

## 2013-06-03 MED ORDER — TECHNETIUM TC 99M MEDRONATE IV KIT
25.0000 | PACK | Freq: Once | INTRAVENOUS | Status: AC | PRN
  Administered 2013-06-03: 25 via INTRAVENOUS

## 2013-06-03 NOTE — Progress Notes (Signed)
[facility] PROGRESS NOTE  [name] WNU:[id number] DOB: Jan 21, 1931 DOA: 05/28/2013 PCP: [name], MD  Assessment/Plan: Acute renal failure, postobstructive s/p1. Cystoscopy, bilateral retrograde pyelogram interpretation.  2. Insertion of bilateral ureteral stents, 6 x 26, no tether.  3. Transurethral resection of bladder tumor, volume medium. Slowly improving, but not back to baseline. Continue IV fluids. Urology might replace Foley and post void residual high.  Prostate cancer recurrence. Shared diagnosis with patient. Will need antiandrogen therapy. Bone scan pending  Atrial fibrillation -right now rate controlled -monitor rate continue beta blockers -holding xarelto until renal function back to normal  Constipation: Laxatives.  Anemia -likely chronic disease  Cough -appears to be more consistent with asthmatic cough -started on dulera -CXR shows COPD  Hyperkalemia  Urinary urgency, frequency. UA negative for infection.   Code Status: Full Code Family Communication: wife 5/20 Disposition Plan: Home   Consultants:  Urology   Antibiotics:  None  HPI/Subjective: No new complaints. Urinary symptoms unchanged. No dyspnea.  Objective: Filed Vitals:   06/03/13 0506  BP: 119/71  Pulse: 72  Temp: 97.7 F (36.5 C)  Resp:     Intake/Output Summary (Last 24 hours) at 06/03/13 1432 Last data filed at 06/03/13 1300  Gross per 24 hour  Intake 2166.25 ml  Output   1800 ml  Net 366.25 ml   Filed Weights   05/28/13 1939 05/29/13 0216  Weight: 79.379 kg (175 lb) 78.3 kg (172 lb 9.9 oz)    Exam:   General:  Awake and alert no distress  Cardiovascular: RRR no gallop or rubs  Respiratory: good air entry no rales noted  Abdomen: soft non-tender Ext no CCE Data Reviewed: Basic Metabolic Panel:  Recent Labs Lab 05/29/13 0320 05/29/13 0905  05/29/13 2009 05/30/13 0755 05/31/13 0410 06/01/13 0505 06/03/13 0424  NA 140  141  < > 141 142 140 142 140  K 5.0 4.7  < > 4.4 4.2 4.1 3.5* 4.1  CL 107 105  < > 105 106 105 107 107  CO2 19 17*  < > 19 19 19 23 19   GLUCOSE 78 137*  < > 116* 98 122* 89 93  BUN 68* 65*  < > 62* 59* 50* 46* 34*  CREATININE 4.53* 4.46*  < > 4.70* 4.59* 3.37* 2.62* 1.88*  CALCIUM 9.2 9.0  < > 8.8 8.5 8.5 8.2* 7.8*  MG  --  1.6  --   --   --   --   --   --   PHOS  --  4.2  --   --   --   --   --   --   < > = values in this interval not displayed. Liver Function Tests:  Recent Labs Lab 05/28/13 1946 05/29/13 0905  AST 17 15  ALT 10 10  ALKPHOS 69 61  BILITOT 0.4 0.3  PROT 7.3 6.6  ALBUMIN 4.0 3.5   No results found for this basename: LIPASE, AMYLASE,  in the last 168 hours No results found for this basename: AMMONIA,  in the last 168 hours CBC:  Recent Labs Lab 05/28/13 1946 05/29/13 0905 05/31/13 0410 06/01/13 0505  WBC 5.9 6.5 14.6* 7.9  NEUTROABS 4.2  --   --   --   HGB 9.7* 8.7* 9.4* 8.8*  HCT 28.6* 25.8* 27.5* 25.3*  MCV 98.6 98.1 97.5 97.7  PLT 181 144* 148* 153   Cardiac Enzymes: No results found for this basename: CKTOTAL, CKMB, CKMBINDEX, TROPONINI,  in the last 168 hours BNP (last 3 results) No results found for this basename: PROBNP,  in the last 8760 hours CBG:  Recent Labs Lab 05/29/13 0244 05/29/13 0324  GLUCAP 47* 100*    No results found for this or any previous visit (from the past 240 hour(s)).   Studies: No results found.  Scheduled Meds: . bisacodyl  10 mg Oral Daily  . darifenacin  15 mg Oral Daily  . docusate sodium  100 mg Oral BID  . heparin subcutaneous  5,000 Units Subcutaneous 3 times per day  . levothyroxine  137 mcg Oral QAC breakfast  . mometasone-formoterol  2 puff Inhalation BID  . propranolol ER  60 mg Oral Daily  . sodium chloride  3 mL Intravenous Q12H  . tamsulosin  0.4 mg Oral Daily  . tiotropium  18 mcg Inhalation Daily   Continuous Infusions: . sodium chloride 125 mL/hr at 06/03/13 2458    Time spent: 25  min  Delfina Redwood, MD  [facility] Pager [phone] If 7PM-7AM, please contact night-coverage at [email or web address], password [facility] 06/03/2013, 2:32 PM  LOS: 6 days

## 2013-06-04 DIAGNOSIS — K5900 Constipation, unspecified: Secondary | ICD-10-CM

## 2013-06-04 LAB — BASIC METABOLIC PANEL
BUN: 27 mg/dL — AB (ref 6–23)
CHLORIDE: 107 meq/L (ref 96–112)
CO2: 19 mEq/L (ref 19–32)
CREATININE: 1.68 mg/dL — AB (ref 0.50–1.35)
Calcium: 7.7 mg/dL — ABNORMAL LOW (ref 8.4–10.5)
GFR calc Af Amer: 43 mL/min — ABNORMAL LOW (ref >90)
GFR calc non Af Amer: 37 mL/min — ABNORMAL LOW (ref >90)
GLUCOSE: 84 mg/dL (ref 70–99)
POTASSIUM: 4.4 meq/L (ref 3.7–5.3)
Sodium: 140 mEq/L (ref 137–147)

## 2013-06-04 MED ORDER — HYDROCODONE-ACETAMINOPHEN 5-325 MG PO TABS: 1.0000 | ORAL_TABLET | Freq: Four times a day (QID) | ORAL | Status: DC | PRN

## 2013-06-04 MED ORDER — FERROUS SULFATE 325 (65 FE) MG PO TABS: 325.0000 mg | ORAL_TABLET | Freq: Every day | ORAL | Status: DC

## 2013-06-04 NOTE — Progress Notes (Signed)
Pt voided 500 mls of light Deyci Gesell clear with no odor urine post void bladder scanned for 303 ml then after voided 125 ml Pt did c/o of pain after voiding and 1 Vicodin was given. Arthor Captain LPN

## 2013-06-04 NOTE — Discharge Summary (Signed)
Physician Discharge Summary  [name] LKG:[id number] DOB: Jan 26, 1931 DOA: 05/28/2013  PCP: [name], MD  Admit date: 05/28/2013 Discharge date: 06/04/2013  Time spent: greater than 30 minutes  Recommendations for Outpatient Follow-up:  1. Monitor BMET 2. Monitor H/H  Discharge Diagnoses:  Primary problem   Acute renal failure secondary to obstruction Active Problems: Bilateral hydronephrosis secondary to tumor   Bladder mass, recurrence of prostate cancer   ATRIAL FIBRILLATION-paroxysmal   Pacemaker-dual-chamber-[employer]   Anemia   Hyperkalemia   Prostate cancer   Unspecified constipation  Discharge Condition: stabel  Filed Weights   05/28/13 1939 05/29/13 0216  Weight: 79.379 kg (175 lb) 78.3 kg (172 lb 9.9 oz)    History of present illness:  80 y.o. male  has a past medical history of Bradycardia; Atrial flutter; Atrial fibrillation; Prostate cancer (12-21-2003); Hematoma; Mitral regurgitation; Pacemaker --[employer]; Orthostatic lightheadedness; Subdural hematoma; Anemia due to GI blood loss; and Prostate cancer.  Presented with  Patient had routine lab work done that showed acute renal failure. No hx of this in the past. He noted decreased urine output for some time. Urologist have told him few days ago that he was retaining his urine. Last week he was started on Flomax in hopes to help with retention. Yesterday he has wet is bed. Denies any nausea or vomiting no diarrhea.  Bladder scan in ER showed 300 ml of urine. Patient reports hxof prostate CA diagnosed and treated 5 years ago.  Hospitalist was called for admission for ARF.  Hospital Course:  Patient was admitted to hospitalists. Urology consulted. Ultrasound showed bilateral hydronephrosis with a nondistended bladder. CAT scan showed very thickened bladder neck worrisome for neoplasm. Foley catheter was placed without improvement in creatinine. Patient had a history of bladder cancer  status post primary brachytherapy. Patient was taken to [facility] long for cystoscopy by Dr. Tresa Moore. It showed a nodular mass at the trigone and bladder neck completely involving bilateral ureteral orifices worrisome for malignancy with malignant ureteral obstruction. Bilateral severe hydroureteral nephrosis to the level of the UV junction. The ureteral orifices were unroofed and stents were placed. Transurethral resection of bladder tumor was accomplished. Pathology came back positive for prostate cancer invading the muscularis propria. With hydration and decompression of obstruction, renal failure slowly improved. Creatinine much improved, but not quite back to baseline. Creatinine on the day of discharge about 1.6. Urology has recommended firm upon, androgen deprivation. Bone scan was performed which showed no metastasis. Foley catheter has been removed and patient has minimal post void residual by bladder scan. Patient's Annamaria Helling has been held since admission for the procedure and due to renal failure. It may be resumed. With hydration, patient did have a slight drop in his hemoglobin. Suspect some of this may be hydration, but also blood loss in the urine. Have started patient on iron. Other medical problems remained stable during the hospitalization.  Procedures:  Cystoscopy, stenting and transurethral resection of bladder neck neoplasm  Consultations:  Urology  Discharge Exam: Filed Vitals:   06/04/13 0400  BP: 127/62  Pulse: 78  Temp: 98.1 F (36.7 C)  Resp: 18    General: Comfortable. Cardiovascular: Regular rate rhythm without murmurs gallops rubs Respiratory: Clear to auscultation bilaterally without wheezes rhonchi or rales Abdomen soft nontender nondistended Extremities no clubbing cyanosis or edema  Discharge Instructions You were cared for by a hospitalist during your hospital stay. If you have any questions about your discharge medications or the care you received while you  were in the hospital after you are discharged, you can call the unit and asked to speak with the hospitalist on call if the hospitalist that took care of you is not available. Once you are discharged, your primary care physician will handle any further medical issues. Please note that NO REFILLS for any discharge medications will be authorized once you are discharged, as it is imperative that you return to your primary care physician (or establish a relationship with a primary care physician if you do not have one) for your aftercare needs so that they can reassess your need for medications and monitor your lab values.  Discharge Instructions   Activity as tolerated - No restrictions    Complete by:  As directed      Diet - low sodium heart healthy    Complete by:  As directed      Discharge instructions    Complete by:  As directed   Drink plenty of liquids            Medication List         acetaminophen 500 MG tablet  Commonly known as:  TYLENOL  Take 500 mg by mouth every 6 (six) hours as needed for moderate pain.     calcium citrate-vitamin D 315-200 MG-UNIT per tablet  Commonly known as:  CITRACAL+D  Take 1 tablet by mouth 2 (two) times daily.     CEREFOLIN NAC 6-2-600 MG Tabs  Take 1 tablet by mouth daily.     ferrous sulfate 325 (65 FE) MG tablet  Take 1 tablet (325 mg total) by mouth daily with breakfast.     Fish Oil 500 MG Caps  Take 500 mg by mouth 2 (two) times daily.     GLUCOSAMINE 1500 COMPLEX Caps  Take 1 capsule by mouth 2 (two) times daily.     guaiFENesin 100 MG/5ML liquid  Commonly known as:  ROBITUSSIN  Take 200 mg by mouth 2 (two) times daily as needed for cough or congestion.     HYDROcodone-acetaminophen 5-325 MG per tablet  Commonly known as:  NORCO/VICODIN  Take 1 tablet by mouth every 6 (six) hours as needed for moderate pain.     levothyroxine 137 MCG tablet  Commonly known as:  SYNTHROID, LEVOTHROID  Take 137 mcg by mouth daily before  breakfast.     LUBRICATING EYE DROPS 0.5-0.9 % Soln  Generic drug:  Carboxymethylcellul-Glycerin  Apply 1 drop to eye daily as needed.     Melatonin 3 MG Tabs  Take 1 tablet by mouth at bedtime.     multivitamin with minerals Tabs tablet  Take 1 tablet by mouth daily.     propranolol ER 60 MG 24 hr capsule  Commonly known as:  INDERAL LA  Take 60 mg by mouth daily.     rivaroxaban 20 MG Tabs tablet  Commonly known as:  XARELTO  Take 20 mg by mouth daily.     solifenacin 10 MG tablet  Commonly known as:  VESICARE  Take 10 mg by mouth daily.     tamsulosin 0.4 MG Caps capsule  Commonly known as:  FLOMAX  Take 0.4 mg by mouth daily.       No Known Allergies     Follow-up Information   Follow up with DAHLSTEDT, Lillette Boxer, MD In 2 weeks.   Specialty:  Urology   Contact information:   [place] [address] [phone]       Follow  up with [name], MD In 2 weeks. (to check kidney function and hemoglobin)    Specialty:  Internal Medicine   Contact information:   [address] [place] [address] [phone]        The results of significant diagnostics from this hospitalization (including imaging, microbiology, ancillary and laboratory) are listed below for reference.    Significant Diagnostic Studies: Ct Abdomen Pelvis Wo Contrast  05/30/2013   CLINICAL DATA:  Acute renal failure. Bilateral hydronephrosis at recent ultrasound.  EXAM: CT ABDOMEN AND PELVIS WITHOUT CONTRAST  TECHNIQUE: Multidetector CT imaging of the abdomen and pelvis was performed following the standard protocol without IV contrast.  COMPARISON:  Renal ultrasound obtained yesterday.  FINDINGS: Again demonstrated is moderate dilatation of both renal collecting systems and ureters. The ureters are dilated to the level of the urinary bladder. There is moderate diffuse bladder wall thickening. Foley catheter in the bladder. No urinary tract calculi are seen.  Prostate radiation seed implants are demonstrated with no enlargement of the prostate gland. Small bilateral inguinal hernias containing fat.  Small bilateral pleural effusions. Mild bilateral lower lobe atelectasis. Mild patchy opacity in the lingula. Multiple small calcified granulomata in the liver and spleen. Unremarkable non contrasted appearance of the pancreas, gallbladder and adrenal glands. Small amount of free peritoneal fluid in the inferior pelvis. This is slightly higher in density than simple fluid, measuring between 8 and 13 Hounsfield units.  Minimal sigmoid diverticulosis without evidence of diverticulitis. No enlarged lymph nodes. Mildly enlarged heart. Lumbar lower thoracic spine degenerative changes. No evidence of bony metastatic disease.  IMPRESSION: 1. Moderate bilateral hydronephrosis and hydroureter to the level of a decompressed urinary bladder with moderate diffuse wall thickening. This is concerning for the possibility of a bladder neoplasm causing ureteral obstruction. Recommend cystoscopy. 2. Small amount of minimally complex free peritoneal fluid in the inferior pelvis. 3. Mild cardiomegaly. 4. Small bilateral pleural effusions and mild bilateral lower lobe atelectasis. 5. Mild patchy scarring or atelectasis in the lingula. 6. Small bilateral inguinal hernias containing fat.   Electronically Signed   By: Enrique Sack M.D.   On: 05/30/2013 01:27   Dg Chest 2 View  05/29/2013   CLINICAL DATA:  Cough  EXAM: CHEST  2 VIEW  COMPARISON:  03/18/2013  FINDINGS: Hyperinflation suggests COPD but coarsening of the interstitial markings but no focal lobar opacity. No pleural effusion. Heart size is mildly enlarged without evidence for edema. Dual lead left-sided pacer in place. No pneumothorax. No acute osseous finding.  IMPRESSION: Hyperinflation suggesting emphysema with mild cardiomegaly.   Electronically Signed   By: Conchita Paris M.D.   On: 05/29/2013 12:29   Nm Bone Scan Whole  Body  06/03/2013   CLINICAL DATA:  Recurrent prostate cancer.  Prior knee surgery  EXAM: NUCLEAR MEDICINE WHOLE BODY BONE SCAN  TECHNIQUE: Whole body anterior and posterior images were obtained approximately 3 hours after intravenous injection of radiopharmaceutical.  RADIOPHARMACEUTICALS:  25.0 mCi Technetium-99 MDP  COMPARISON:  CT 05/30/2013  FINDINGS: There is a focus of intense uptake within the L1 vertebral body on the right. This corresponds to a osteophyte and endplate sclerosis on comparison CT scan. No additional abnormal uptake within the axial or appendicular skeleton to suggest metastasis. Photopenia noted at site pacemaker. Left knee arthroplasty present.  IMPRESSION: 1. No evidence of skeletal metastasis. 2. Intense uptake at L1 on the right corresponds osteophytosis and sclerosis on comparison CT.   Electronically Signed   By: Nicole Kindred  Leonia Reeves M.D.   On: 06/03/2013 16:23   US Renal  05/29/2013   CLINICAL DATA:  Acute renal failure. History of prostate cancer. Postobstructive acute renal failure suspected per electronic medical record.  EXAM: RENAL/URINARY TRACT ULTRASOUND COMPLETE  COMPARISON:  Abdominal ultrasound 09/19/2011  FINDINGS: Right Kidney:  Length: 13 cm. Moderate hydronephrosis and proximal hydroureter. A cause of obstruction is not visualized. Normal cortical echogenicity. No evidence of solid mass.  Left Kidney:  Length: 13 cm. Moderate hydronephrosis and proximal hydroureter. A cause of obstruction is not visualized. Normal cortical echogenicity. No evidence of solid mass.  Bladder:  Decompressed around a Foley catheter.  IMPRESSION: Moderate bilateral hydronephrosis with decompressed bladder. While the hydronephrosis could be related to recent bladder outlet obstruction, suggest follow-up examination to document normalization (thereby excluding an obstructive retroperitoneal process affecting the distal ureters.)   Electronically Signed   By: Jorje Guild M.D.   On: 05/29/2013  11:29    Microbiology: No results found for this or any previous visit (from the past 240 hour(s)).   Labs: Basic Metabolic Panel:  Recent Labs Lab 05/29/13 0320 05/29/13 0905  05/31/13 0410 06/01/13 0505 06/03/13 0424 06/03/13 1708 06/04/13 0348  NA 140 141  < > 140 142 140 139 140  K 5.0 4.7  < > 4.1 3.5* 4.1 4.2 4.4  CL 107 105  < > 105 107 107 108 107  CO2 19 17*  < > 19 23 19 20 19   GLUCOSE 78 137*  < > 122* 89 93 104* 84  BUN 68* 65*  < > 50* 46* 34* 31* 27*  CREATININE 4.53* 4.46*  < > 3.37* 2.62* 1.88* 1.75* 1.68*  CALCIUM 9.2 9.0  < > 8.5 8.2* 7.8* 7.7* 7.7*  MG  --  1.6  --   --   --   --   --   --   PHOS  --  4.2  --   --   --   --   --   --   < > = values in this interval not displayed. Liver Function Tests:  Recent Labs Lab 05/28/13 1946 05/29/13 0905  AST 17 15  ALT 10 10  ALKPHOS 69 61  BILITOT 0.4 0.3  PROT 7.3 6.6  ALBUMIN 4.0 3.5   No results found for this basename: LIPASE, AMYLASE,  in the last 168 hours No results found for this basename: AMMONIA,  in the last 168 hours CBC:  Recent Labs Lab 05/28/13 1946 05/29/13 0905 05/31/13 0410 06/01/13 0505 06/03/13 1708  WBC 5.9 6.5 14.6* 7.9 5.1  NEUTROABS 4.2  --   --   --   --   HGB 9.7* 8.7* 9.4* 8.8* 9.7*  HCT 28.6* 25.8* 27.5* 25.3* 27.8*  MCV 98.6 98.1 97.5 97.7 97.9  PLT 181 144* 148* 153 174   Cardiac Enzymes: No results found for this basename: CKTOTAL, CKMB, CKMBINDEX, TROPONINI,  in the last 168 hours BNP: BNP (last 3 results) No results found for this basename: PROBNP,  in the last 8760 hours CBG:  Recent Labs Lab 05/29/13 0244 05/29/13 0324  GLUCAP 47* 100*       Signed:  Delfina Redwood  [facility] 06/04/2013, 12:21 PM

## 2013-06-07 ENCOUNTER — Emergency Department (HOSPITAL_COMMUNITY)
Admission: EM | Admit: 2013-06-07 | Discharge: 2013-06-07 | Disposition: A | Discharge status: Home / Self Care | Payer: Medicare Other | Attending: Emergency Medicine | Admitting: Emergency Medicine

## 2013-06-07 ENCOUNTER — Encounter (HOSPITAL_COMMUNITY): Payer: Self-pay | Admitting: Emergency Medicine

## 2013-06-07 DIAGNOSIS — Z8546 Personal history of malignant neoplasm of prostate: Secondary | ICD-10-CM | POA: Diagnosis not present

## 2013-06-07 DIAGNOSIS — IMO0002 Reserved for concepts with insufficient information to code with codable children: Secondary | ICD-10-CM | POA: Diagnosis not present

## 2013-06-07 DIAGNOSIS — D649 Anemia, unspecified: Secondary | ICD-10-CM | POA: Diagnosis not present

## 2013-06-07 DIAGNOSIS — Z79899 Other long term (current) drug therapy: Secondary | ICD-10-CM | POA: Diagnosis not present

## 2013-06-07 DIAGNOSIS — R310 Gross hematuria: Secondary | ICD-10-CM | POA: Diagnosis not present

## 2013-06-07 DIAGNOSIS — Z950 Presence of cardiac pacemaker: Secondary | ICD-10-CM | POA: Insufficient documentation

## 2013-06-07 DIAGNOSIS — N390 Urinary tract infection, site not specified: Secondary | ICD-10-CM | POA: Insufficient documentation

## 2013-06-07 DIAGNOSIS — Z9889 Other specified postprocedural states: Secondary | ICD-10-CM | POA: Insufficient documentation

## 2013-06-07 DIAGNOSIS — I4892 Unspecified atrial flutter: Secondary | ICD-10-CM | POA: Insufficient documentation

## 2013-06-07 DIAGNOSIS — Z7901 Long term (current) use of anticoagulants: Secondary | ICD-10-CM | POA: Insufficient documentation

## 2013-06-07 DIAGNOSIS — R339 Retention of urine, unspecified: Secondary | ICD-10-CM | POA: Diagnosis present

## 2013-06-07 DIAGNOSIS — I4891 Unspecified atrial fibrillation: Secondary | ICD-10-CM | POA: Diagnosis not present

## 2013-06-07 DIAGNOSIS — Z87828 Personal history of other (healed) physical injury and trauma: Secondary | ICD-10-CM | POA: Insufficient documentation

## 2013-06-07 LAB — COMPREHENSIVE METABOLIC PANEL
ALT: 22 U/L (ref 0–53)
AST: 19 U/L (ref 0–37)
Albumin: 3.7 g/dL (ref 3.5–5.2)
Alkaline Phosphatase: 82 U/L (ref 39–117)
BILIRUBIN TOTAL: 0.3 mg/dL (ref 0.3–1.2)
BUN: 38 mg/dL — AB (ref 6–23)
CHLORIDE: 102 meq/L (ref 96–112)
CO2: 19 mEq/L (ref 19–32)
Calcium: 9.3 mg/dL (ref 8.4–10.5)
Creatinine, Ser: 1.8 mg/dL — ABNORMAL HIGH (ref 0.50–1.35)
GFR calc Af Amer: 40 mL/min — ABNORMAL LOW (ref >90)
GFR calc non Af Amer: 34 mL/min — ABNORMAL LOW (ref >90)
Glucose, Bld: 98 mg/dL (ref 70–99)
Potassium: 5 mEq/L (ref 3.7–5.3)
Sodium: 137 mEq/L (ref 137–147)
TOTAL PROTEIN: 7.2 g/dL (ref 6.0–8.3)

## 2013-06-07 LAB — CBC WITH DIFFERENTIAL/PLATELET
BASOS PCT: 1 % (ref 0–1)
Basophils Absolute: 0 10*3/uL (ref 0.0–0.1)
Eosinophils Absolute: 0.3 10*3/uL (ref 0.0–0.7)
Eosinophils Relative: 4 % (ref 0–5)
HEMATOCRIT: 28.2 % — AB (ref 39.0–52.0)
HEMOGLOBIN: 9.9 g/dL — AB (ref 13.0–17.0)
Lymphocytes Relative: 21 % (ref 12–46)
Lymphs Abs: 1.5 10*3/uL (ref 0.7–4.0)
MCH: 33.8 pg (ref 26.0–34.0)
MCHC: 35.1 g/dL (ref 30.0–36.0)
MCV: 96.2 fL (ref 78.0–100.0)
MONOS PCT: 6 % (ref 3–12)
Monocytes Absolute: 0.5 10*3/uL (ref 0.1–1.0)
Neutro Abs: 4.9 10*3/uL (ref 1.7–7.7)
Neutrophils Relative %: 68 % (ref 43–77)
Platelets: 228 10*3/uL (ref 150–400)
RBC: 2.93 MIL/uL — ABNORMAL LOW (ref 4.22–5.81)
RDW: 13 % (ref 11.5–15.5)
WBC: 7.2 10*3/uL (ref 4.0–10.5)

## 2013-06-07 LAB — URINALYSIS, ROUTINE W REFLEX MICROSCOPIC
GLUCOSE, UA: NEGATIVE mg/dL
Ketones, ur: 40 mg/dL — AB
Nitrite: POSITIVE — AB
Protein, ur: >300 mg/dL — AB
SPECIFIC GRAVITY, URINE: 1.018 (ref 1.005–1.030)
Urobilinogen, UA: 1 mg/dL (ref 0.0–1.0)
pH: 5.5 (ref 5.0–8.0)

## 2013-06-07 LAB — URINE CULTURE
Colony Count: NO GROWTH
Culture: NO GROWTH

## 2013-06-07 LAB — URINE MICROSCOPIC-ADD ON

## 2013-06-07 MED ORDER — CIPROFLOXACIN HCL 500 MG PO TABS
500.0000 mg | ORAL_TABLET | Freq: Once | ORAL | Status: AC
  Administered 2013-06-07: 500 mg via ORAL
  Filled 2013-06-07: qty 1

## 2013-06-07 MED ORDER — SODIUM CHLORIDE 0.9 % IV BOLUS (SEPSIS)
1000.0000 mL | Freq: Once | INTRAVENOUS | Status: AC
  Administered 2013-06-07: 1000 mL via INTRAVENOUS

## 2013-06-07 MED ORDER — CIPROFLOXACIN HCL 500 MG PO TABS: 500.0000 mg | ORAL_TABLET | Freq: Two times a day (BID) | ORAL | Status: DC

## 2013-06-07 NOTE — ED Provider Notes (Signed)
CSN: [id number]     Arrival date & time 06/07/13  1204 History   First MD Initiated Contact with Patient 06/07/13 1255     Chief Complaint  Patient presents with  . Abdominal Pain  . Urinary Retention  . Hematuria     (Consider location/radiation/quality/duration/timing/severity/associated sxs/prior Treatment) HPI Pt presenting with c/o difficulty urinating.  Pt had bladder stent placed 3 days ago.  Catheter was removed without incident, but patient states he has had difficulty with urinary stream somewhat since then.  Today he developed difficulty urinating and lower abdominal pain.  No fever/chills.  No vomiting.  Had seen some blood in his urine prior to these symptoms beginning.  No blood clots.  Symptoms are continuous and worsening.  There are no other associated systemic symptoms, there are no other alleviating or modifying factors.   Past Medical History  Diagnosis Date  . Bradycardia     s/p PPM  . Atrial flutter     s/p ablation  . Atrial fibrillation   . Prostate cancer 12-21-2003    12-7-seed implants done  . Hematoma   . Mitral regurgitation     mild echo 2012  . Pacemaker --[employer]   . Orthostatic lightheadedness   . Subdural hematoma   . Anemia due to GI blood loss   . Prostate cancer    Past Surgical History  Procedure Laterality Date  . Tonsillectomy  as child  . Arthrscopic left knee surgery  02-11-2007  . Cardioversion  09-04-2006  . Av node ablation  04-06-2007  . Subdural hematoma evacuation via craniotomy  12-16-2007  . Rotator cuff repair w/ distal clavicle excision  08-31-2010    left  . Pacemaker insertion      [employer] Insignia   . Total knee arthroplasty  12/18/2010    Procedure: TOTAL KNEE ARTHROPLASTY;  Surgeon: Mauri Pole;  Location: [facility];  Service: Orthopedics;  Laterality: Left;  . Joint replacement    . Brain surgery    . Cardioversion    . Cataract extraction    . Left knee arthroscopy    . Cystoscopy w/ ureteral stent  placement Bilateral 05/30/2013    Procedure: CYSTOSCOPY WITH RETROGRADE PYELOGRAM/ BILATERAL URETERAL STENT PLACEMENT, TRANSURETRAL RESECTION OF BLADDER TUMOR WITH GYRUS;  Surgeon: Alexis Frock, MD;  Location: [facility];  Service: Urology;  Laterality: Bilateral;   Family History  Problem Relation Age of Onset  . Aneurysm Father   . Stroke Father    History  Substance Use Topics  . Smoking status: Never Smoker   . Smokeless tobacco: Never Used  . Alcohol Use: 7.7 oz/week    7 Glasses of wine, 7 Drinks containing 0.5 oz of alcohol per week     Comment: 2 glasses wine day    Review of Systems ROS reviewed and all otherwise negative except for mentioned in HPI    Allergies  Review of patient's allergies indicates no known allergies.  Home Medications   Prior to Admission medications   Medication Sig Start Date End Date Taking? Authorizing Provider  acetaminophen (TYLENOL) 500 MG tablet Take 500 mg by mouth every 6 (six) hours as needed for moderate pain.   Yes Historical Provider, MD  calcium citrate-vitamin D (CITRACAL+D) 315-200 MG-UNIT per tablet Take 1 tablet by mouth 2 (two) times daily.   Yes Historical Provider, MD  Carboxymethylcellul-Glycerin (LUBRICATING EYE DROPS) 0.5-0.9 % SOLN Apply 1 drop to eye daily as needed (dry eyes).    Yes Historical Provider,  MD  ferrous sulfate 325 (65 FE) MG tablet Take 1 tablet (325 mg total) by mouth daily with breakfast. 06/04/13  Yes Delfina Redwood, MD  Glucosamine-Chondroit-Vit C-Mn (GLUCOSAMINE 1500 COMPLEX) CAPS Take 1 capsule by mouth 2 (two) times daily.   Yes Historical Provider, MD  guaiFENesin (ROBITUSSIN) 100 MG/5ML liquid Take 100 mg by mouth 2 (two) times daily as needed for cough or congestion.    Yes Historical Provider, MD  HYDROcodone-acetaminophen (NORCO/VICODIN) 5-325 MG per tablet Take 1 tablet by mouth every 6 (six) hours as needed for moderate pain. 06/04/13  Yes Delfina Redwood, MD  levothyroxine (SYNTHROID,  LEVOTHROID) 137 MCG tablet Take 137 mcg by mouth daily before breakfast.   Yes Historical Provider, MD  Melatonin 3 MG TABS Take 3 mg by mouth at bedtime.    Yes Historical Provider, MD  Methylfol-Methylcob-Acetylcyst (CEREFOLIN NAC) 6-2-600 MG TABS Take 1 tablet by mouth daily.   Yes Historical Provider, MD  mometasone-formoterol (DULERA) 100-5 MCG/ACT AERO Inhale 2 puffs into the lungs 2 (two) times daily.   Yes Historical Provider, MD  Multiple Vitamin (MULTIVITAMIN WITH MINERALS) TABS tablet Take 1 tablet by mouth daily.   Yes Historical Provider, MD  Omega-3 Fatty Acids (FISH OIL) 500 MG CAPS Take 500 mg by mouth 2 (two) times daily.   Yes Historical Provider, MD  propranolol ER (INDERAL LA) 60 MG 24 hr capsule Take 60 mg by mouth daily.   Yes Historical Provider, MD  rivaroxaban (XARELTO) 20 MG TABS tablet Take 20 mg by mouth daily.    Yes Historical Provider, MD  solifenacin (VESICARE) 10 MG tablet Take 10 mg by mouth daily.   Yes Historical Provider, MD  tamsulosin (FLOMAX) 0.4 MG CAPS capsule Take 0.4 mg by mouth daily.   Yes Historical Provider, MD   BP 117/70  Pulse 84  Temp(Src) 98 F (36.7 C) (Oral)  Resp 15  Wt 168 lb 3 oz (76.289 kg)  SpO2 100% Vitals reviewed Physical Exam Physical Examination: General appearance - alert, well appearing, and in no distress Mental status - alert, oriented to person, place, and time Eyes - no conjunctival injection, no scleral icterus Mouth - mucous membranes moist, pharynx normal without lesions Chest - clear to auscultation, no wheezes, rales or rhonchi, symmetric air entry Heart - normal rate, regular rhythm, normal S1, S2, no murmurs, rubs, clicks or gallops Abdomen - soft, nontender, nondistended, no masses or organomegaly GU Male - no penile lesions or discharge, no testicular masses or tenderness, no hernias, foley catheter in place draining grossly blood urine Extremities - peripheral pulses normal, no pedal edema, no clubbing or  cyanosis Skin - normal coloration and turgor, no rashes  ED Course  Procedures (including critical care time) Labs Review Labs Reviewed  CBC WITH DIFFERENTIAL - Abnormal; Notable for the following:    RBC 2.93 (*)    Hemoglobin 9.9 (*)    HCT 28.2 (*)    All other components within normal limits  COMPREHENSIVE METABOLIC PANEL - Abnormal; Notable for the following:    BUN 38 (*)    Creatinine, Ser 1.80 (*)    GFR calc non Af Amer 34 (*)    GFR calc Af Amer 40 (*)    All other components within normal limits  URINALYSIS, ROUTINE W REFLEX MICROSCOPIC - Abnormal; Notable for the following:    Color, Urine RED (*)    APPearance TURBID (*)    Hgb urine dipstick LARGE (*)    Bilirubin  Urine LARGE (*)    Ketones, ur 40 (*)    Protein, ur >300 (*)    Nitrite POSITIVE (*)    Leukocytes, UA LARGE (*)    All other components within normal limits  URINE MICROSCOPIC-ADD ON - Abnormal; Notable for the following:    Bacteria, UA FEW (*)    All other components within normal limits  URINE CULTURE    Imaging Review No results found.   EKG Interpretation None      MDM   Final diagnoses:  Urinary retention  Gross hematuria  Urinary tract infection    Pt presenting with c/o urinary obstruction, foley catheter placed with resolution of symptoms.  Foley irrigated and blood urine decreased.  Pt with signs of UTI in urine, nitrite positive.  Pt is overall well appearing.  Pt started on cipro, will followup with urology tomorrow.  Discharged with strict return precautions.  Pt agreeable with plan.    Threasa Beards, MD 06/10/13 [phone]

## 2013-06-07 NOTE — Discharge Instructions (Signed)
Return to the ED with any concerns including fever/chills, vomiting and not able to keep down liquids, increased abdominal pain, urine not draining into foley bag, decreased level of alertness/lethargy, or any other alarming symptoms

## 2013-06-07 NOTE — ED Notes (Signed)
Pt presents to department for evaluation of lower abdominal pain, hematuria and decreased urinary output. States he has bladder stent placed on Friday. Now states increased lower abdominal pain and bloody urine this morning. Also states decreased urine output since catheter was removed. 7/10 pain upon arrival to ED. Pt is alert and oriented x4. Wife at bedside.

## 2013-06-13 ENCOUNTER — Emergency Department (HOSPITAL_COMMUNITY)
Admission: EM | Admit: 2013-06-13 | Discharge: 2013-06-13 | Disposition: A | Discharge status: Home / Self Care | Payer: Medicare Other | Attending: Emergency Medicine | Admitting: Emergency Medicine

## 2013-06-13 ENCOUNTER — Emergency Department (HOSPITAL_COMMUNITY): Payer: Medicare Other

## 2013-06-13 ENCOUNTER — Encounter (HOSPITAL_COMMUNITY): Payer: Self-pay | Admitting: Emergency Medicine

## 2013-06-13 DIAGNOSIS — Z87828 Personal history of other (healed) physical injury and trauma: Secondary | ICD-10-CM | POA: Insufficient documentation

## 2013-06-13 DIAGNOSIS — R300 Dysuria: Secondary | ICD-10-CM | POA: Diagnosis not present

## 2013-06-13 DIAGNOSIS — I4891 Unspecified atrial fibrillation: Secondary | ICD-10-CM | POA: Diagnosis not present

## 2013-06-13 DIAGNOSIS — Z950 Presence of cardiac pacemaker: Secondary | ICD-10-CM | POA: Insufficient documentation

## 2013-06-13 DIAGNOSIS — Z7901 Long term (current) use of anticoagulants: Secondary | ICD-10-CM | POA: Diagnosis not present

## 2013-06-13 DIAGNOSIS — Z79899 Other long term (current) drug therapy: Secondary | ICD-10-CM | POA: Diagnosis not present

## 2013-06-13 DIAGNOSIS — Z8546 Personal history of malignant neoplasm of prostate: Secondary | ICD-10-CM | POA: Insufficient documentation

## 2013-06-13 DIAGNOSIS — Z8673 Personal history of transient ischemic attack (TIA), and cerebral infarction without residual deficits: Secondary | ICD-10-CM | POA: Diagnosis not present

## 2013-06-13 DIAGNOSIS — R339 Retention of urine, unspecified: Secondary | ICD-10-CM | POA: Insufficient documentation

## 2013-06-13 DIAGNOSIS — K5900 Constipation, unspecified: Secondary | ICD-10-CM | POA: Insufficient documentation

## 2013-06-13 DIAGNOSIS — I998 Other disorder of circulatory system: Secondary | ICD-10-CM | POA: Insufficient documentation

## 2013-06-13 DIAGNOSIS — D649 Anemia, unspecified: Secondary | ICD-10-CM | POA: Diagnosis not present

## 2013-06-13 DIAGNOSIS — IMO0002 Reserved for concepts with insufficient information to code with codable children: Secondary | ICD-10-CM | POA: Diagnosis not present

## 2013-06-13 DIAGNOSIS — R34 Anuria and oliguria: Secondary | ICD-10-CM | POA: Insufficient documentation

## 2013-06-13 DIAGNOSIS — R109 Unspecified abdominal pain: Secondary | ICD-10-CM | POA: Diagnosis present

## 2013-06-13 LAB — URINALYSIS, ROUTINE W REFLEX MICROSCOPIC
BILIRUBIN URINE: NEGATIVE
Glucose, UA: NEGATIVE mg/dL
Ketones, ur: NEGATIVE mg/dL
NITRITE: NEGATIVE
PH: 5 (ref 5.0–8.0)
Protein, ur: 100 mg/dL — AB
SPECIFIC GRAVITY, URINE: 1.016 (ref 1.005–1.030)
Urobilinogen, UA: 0.2 mg/dL (ref 0.0–1.0)

## 2013-06-13 LAB — COMPREHENSIVE METABOLIC PANEL
ALT: 17 U/L (ref 0–53)
AST: 21 U/L (ref 0–37)
Albumin: 3.7 g/dL (ref 3.5–5.2)
Alkaline Phosphatase: 80 U/L (ref 39–117)
BUN: 32 mg/dL — ABNORMAL HIGH (ref 6–23)
CO2: 19 meq/L (ref 19–32)
CREATININE: 1.83 mg/dL — AB (ref 0.50–1.35)
Calcium: 9.7 mg/dL (ref 8.4–10.5)
Chloride: 100 mEq/L (ref 96–112)
GFR calc Af Amer: 39 mL/min — ABNORMAL LOW (ref >90)
GFR, EST NON AFRICAN AMERICAN: 33 mL/min — AB (ref >90)
Glucose, Bld: 179 mg/dL — ABNORMAL HIGH (ref 70–99)
Potassium: 4.4 mEq/L (ref 3.7–5.3)
Sodium: 136 mEq/L — ABNORMAL LOW (ref 137–147)
Total Bilirubin: 0.3 mg/dL (ref 0.3–1.2)
Total Protein: 7.1 g/dL (ref 6.0–8.3)

## 2013-06-13 LAB — URINE MICROSCOPIC-ADD ON

## 2013-06-13 LAB — CBC WITH DIFFERENTIAL/PLATELET
Basophils Absolute: 0 10*3/uL (ref 0.0–0.1)
Basophils Relative: 0 % (ref 0–1)
Eosinophils Absolute: 0.2 10*3/uL (ref 0.0–0.7)
Eosinophils Relative: 2 % (ref 0–5)
HEMATOCRIT: 27.2 % — AB (ref 39.0–52.0)
HEMOGLOBIN: 9.5 g/dL — AB (ref 13.0–17.0)
Lymphocytes Relative: 13 % (ref 12–46)
Lymphs Abs: 1.2 10*3/uL (ref 0.7–4.0)
MCH: 33.5 pg (ref 26.0–34.0)
MCHC: 34.9 g/dL (ref 30.0–36.0)
MCV: 95.8 fL (ref 78.0–100.0)
MONOS PCT: 5 % (ref 3–12)
Monocytes Absolute: 0.4 10*3/uL (ref 0.1–1.0)
NEUTROS ABS: 7.3 10*3/uL (ref 1.7–7.7)
Neutrophils Relative %: 80 % — ABNORMAL HIGH (ref 43–77)
Platelets: 233 10*3/uL (ref 150–400)
RBC: 2.84 MIL/uL — ABNORMAL LOW (ref 4.22–5.81)
RDW: 13 % (ref 11.5–15.5)
WBC: 9.1 10*3/uL (ref 4.0–10.5)

## 2013-06-13 MED ORDER — HYDROCODONE-ACETAMINOPHEN 5-325 MG PO TABS: 1.0000 | ORAL_TABLET | Freq: Four times a day (QID) | ORAL | Status: DC | PRN

## 2013-06-13 MED ORDER — SODIUM CHLORIDE 0.9 % IV BOLUS (SEPSIS)
1000.0000 mL | Freq: Once | INTRAVENOUS | Status: AC
  Administered 2013-06-13: 1000 mL via INTRAVENOUS

## 2013-06-13 MED ORDER — ONDANSETRON HCL 4 MG/2ML IJ SOLN
4.0000 mg | Freq: Once | INTRAMUSCULAR | Status: AC
  Administered 2013-06-13: 4 mg via INTRAVENOUS
  Filled 2013-06-13: qty 2

## 2013-06-13 MED ORDER — HYDROMORPHONE HCL PF 1 MG/ML IJ SOLN
1.0000 mg | INTRAMUSCULAR | Status: DC | PRN
  Administered 2013-06-13: 1 mg via INTRAVENOUS
  Filled 2013-06-13: qty 1

## 2013-06-13 MED ORDER — DOCUSATE SODIUM 100 MG PO CAPS: 100.0000 mg | ORAL_CAPSULE | Freq: Two times a day (BID) | ORAL | Status: DC

## 2013-06-13 MED ORDER — OXYCODONE-ACETAMINOPHEN 5-325 MG PO TABS
1.0000 | ORAL_TABLET | Freq: Once | ORAL | Status: AC
  Administered 2013-06-13: 1 via ORAL
  Filled 2013-06-13: qty 1

## 2013-06-13 MED ORDER — MORPHINE SULFATE 4 MG/ML IJ SOLN
4.0000 mg | Freq: Once | INTRAMUSCULAR | Status: AC
  Administered 2013-06-13: 4 mg via INTRAVENOUS
  Filled 2013-06-13: qty 1

## 2013-06-13 NOTE — ED Notes (Signed)
Foley catheter leg bag applied.

## 2013-06-13 NOTE — ED Notes (Signed)
51fr foley catheter placed. Brown colored urine returned, sediment noted. Pt states he feels much better. Will continue to monitor.

## 2013-06-13 NOTE — ED Notes (Signed)
Pt to be discharged home with leg bag. Education performed regarding foley catheter care. Pt and family has no further questions at the time. States he feels much better since placement of larger catheter. Foley continues to drain without difficulty. Vital signs stable.

## 2013-06-13 NOTE — ED Provider Notes (Signed)
CSN: [id number]     Arrival date & time 06/13/13  [id number] History   First MD Initiated Contact with Patient 06/13/13 [phone]     Chief Complaint  Patient presents with  . Abdominal Pain  . Urinary Retention     (Consider location/radiation/quality/duration/timing/severity/associated sxs/prior Treatment) HPI  This is a 80 year old with history of a stroke fibrillation, prostate cancer with recent recurrence invading into the bladder and requiring bilateral stents for hydronephrosis who presents with dysuria and urgency. Patient reports that he was in the hospital for several days approximately 2 weeks ago. Noted to have hydronephrosis and found to have recurrence of his prostate cancer that was invading his bladder. He had kidney stents placed. He states that he was doing well and his urine had cleared up. However, over the last 24 hours he reports increasing pain with urinary urgency. He reports "dribbling" with urination. He denies any fevers. He does report lower abdominal pain. He is unable to fully urinate for the last 8-12 hours.  Currently pain is 8/10. He has not taken anything for pain. He denies any nausea, vomiting, or diarrhea. He does report chronic constipation.  Past Medical History  Diagnosis Date  . Bradycardia     s/p PPM  . Atrial flutter     s/p ablation  . Atrial fibrillation   . Prostate cancer 12-21-2003    12-7-seed implants done  . Hematoma   . Mitral regurgitation     mild echo 2012  . Pacemaker --[employer]   . Orthostatic lightheadedness   . Subdural hematoma   . Anemia due to GI blood loss   . Prostate cancer    Past Surgical History  Procedure Laterality Date  . Tonsillectomy  as child  . Arthrscopic left knee surgery  02-11-2007  . Cardioversion  09-04-2006  . Av node ablation  04-06-2007  . Subdural hematoma evacuation via craniotomy  12-16-2007  . Rotator cuff repair w/ distal clavicle excision  08-31-2010    left  . Pacemaker insertion       [employer] Insignia   . Total knee arthroplasty  12/18/2010    Procedure: TOTAL KNEE ARTHROPLASTY;  Surgeon: Mauri Pole;  Location: [facility];  Service: Orthopedics;  Laterality: Left;  . Joint replacement    . Brain surgery    . Cardioversion    . Cataract extraction    . Left knee arthroscopy    . Cystoscopy w/ ureteral stent placement Bilateral 05/30/2013    Procedure: CYSTOSCOPY WITH RETROGRADE PYELOGRAM/ BILATERAL URETERAL STENT PLACEMENT, TRANSURETRAL RESECTION OF BLADDER TUMOR WITH GYRUS;  Surgeon: Alexis Frock, MD;  Location: [facility];  Service: Urology;  Laterality: Bilateral;   Family History  Problem Relation Age of Onset  . Aneurysm Father   . Stroke Father    History  Substance Use Topics  . Smoking status: Never Smoker   . Smokeless tobacco: Never Used  . Alcohol Use: 7.7 oz/week    7 Glasses of wine, 7 Drinks containing 0.5 oz of alcohol per week     Comment: 2 glasses wine day    Review of Systems  Constitutional: Negative.  Negative for fever.  Respiratory: Negative.  Negative for chest tightness and shortness of breath.   Cardiovascular: Negative.  Negative for chest pain.  Gastrointestinal: Positive for abdominal pain and constipation. Negative for nausea, vomiting and diarrhea.  Genitourinary: Positive for dysuria, urgency, decreased urine volume and difficulty urinating. Negative for flank pain.  Musculoskeletal: Negative for back pain.  Skin: Negative for rash.  Neurological: Negative for headaches.  All other systems reviewed and are negative.     Allergies  Review of patient's allergies indicates no known allergies.  Home Medications   Prior to Admission medications   Medication Sig Start Date End Date Taking? Authorizing Provider  acetaminophen (TYLENOL) 500 MG tablet Take 500 mg by mouth every 6 (six) hours as needed for moderate pain.   Yes Historical Provider, MD  calcium citrate-vitamin D (CITRACAL+D) 315-200 MG-UNIT per tablet Take 1 tablet by  mouth 2 (two) times daily.   Yes Historical Provider, MD  Carboxymethylcellul-Glycerin (LUBRICATING EYE DROPS) 0.5-0.9 % SOLN Apply 1 drop to eye daily as needed (dry eyes).    Yes Historical Provider, MD  ferrous sulfate 325 (65 FE) MG tablet Take 325 mg by mouth daily with breakfast.   Yes Historical Provider, MD  Glucosamine-Chondroit-Vit C-Mn (GLUCOSAMINE 1500 COMPLEX) CAPS Take 1 capsule by mouth 2 (two) times daily.   Yes Historical Provider, MD  levothyroxine (SYNTHROID, LEVOTHROID) 137 MCG tablet Take 137 mcg by mouth daily before breakfast.   Yes Historical Provider, MD  Melatonin 3 MG TABS Take 3 mg by mouth at bedtime.    Yes Historical Provider, MD  Methylfol-Methylcob-Acetylcyst (CEREFOLIN NAC) 6-2-600 MG TABS Take 1 tablet by mouth daily.   Yes Historical Provider, MD  mometasone-formoterol (DULERA) 100-5 MCG/ACT AERO Inhale 2 puffs into the lungs 2 (two) times daily.   Yes Historical Provider, MD  Multiple Vitamin (MULTIVITAMIN WITH MINERALS) TABS tablet Take 1 tablet by mouth daily.   Yes Historical Provider, MD  Omega-3 Fatty Acids (FISH OIL) 500 MG CAPS Take 500 mg by mouth 2 (two) times daily.   Yes Historical Provider, MD  propranolol ER (INDERAL LA) 60 MG 24 hr capsule Take 60 mg by mouth daily.   Yes Historical Provider, MD  rivaroxaban (XARELTO) 20 MG TABS tablet Take 20 mg by mouth daily.    Yes Historical Provider, MD  solifenacin (VESICARE) 10 MG tablet Take 10 mg by mouth daily.   Yes Historical Provider, MD  tamsulosin (FLOMAX) 0.4 MG CAPS capsule Take 0.4 mg by mouth daily.   Yes Historical Provider, MD  docusate sodium (COLACE) 100 MG capsule Take 1 capsule (100 mg total) by mouth every 12 (twelve) hours. 06/13/13   Merryl Hacker, MD  HYDROcodone-acetaminophen (NORCO/VICODIN) 5-325 MG per tablet Take 1-2 tablets by mouth every 6 (six) hours as needed for moderate pain or severe pain. 06/13/13   Merryl Hacker, MD   BP 131/80  Pulse 90  Temp(Src) 97.4 F (36.3 C)  (Oral)  Resp 18  SpO2 95% Physical Exam  Nursing note and vitals reviewed. Constitutional: He is oriented to person, place, and time. No distress.  Elderly  HENT:  Head: Normocephalic and atraumatic.  Cardiovascular: Normal rate, regular rhythm and normal heart sounds.   No murmur heard. Pulmonary/Chest: Effort normal and breath sounds normal. No respiratory distress. He has no wheezes.  Abdominal: Soft. Bowel sounds are normal. He exhibits no mass. There is tenderness. There is no rebound and no guarding.  Suprapubic tenderness to palpation without rebound or guarding  Musculoskeletal:  Tenderness palpation ecchymosis noted over the left posterior ribs, no crepitus or deformity noted  Lymphadenopathy:    He has no cervical adenopathy.  Neurological: He is alert and oriented to person, place, and time.  Skin: Skin is warm and dry.  Psychiatric: He has a normal mood and affect.    ED Course  Procedures (including critical care time) Labs Review Labs Reviewed  CBC WITH DIFFERENTIAL - Abnormal; Notable for the following:    RBC 2.84 (*)    Hemoglobin 9.5 (*)    HCT 27.2 (*)    Neutrophils Relative % 80 (*)    All other components within normal limits  URINALYSIS, ROUTINE W REFLEX MICROSCOPIC - Abnormal; Notable for the following:    Color, Urine BROWN (*)    APPearance TURBID (*)    Hgb urine dipstick LARGE (*)    Protein, ur 100 (*)    Leukocytes, UA MODERATE (*)    All other components within normal limits  COMPREHENSIVE METABOLIC PANEL - Abnormal; Notable for the following:    Sodium 136 (*)    Glucose, Bld 179 (*)    BUN 32 (*)    Creatinine, Ser 1.83 (*)    GFR calc non Af Amer 33 (*)    GFR calc Af Amer 39 (*)    All other components within normal limits  URINE MICROSCOPIC-ADD ON - Abnormal; Notable for the following:    Bacteria, UA FEW (*)    All other components within normal limits  URINE CULTURE    Imaging Review Ct Abdomen Pelvis Wo  Contrast  06/13/2013   CLINICAL DATA:  Lower abdominal pain, urinary retention, recent bilateral renal stent placement. History of prostate cancer with brachytherapy seeds.  EXAM: CT ABDOMEN AND PELVIS WITHOUT CONTRAST  TECHNIQUE: Multidetector CT imaging of the abdomen and pelvis was performed following the standard protocol without IV contrast.  COMPARISON:  05/30/2013  FINDINGS: Trace left pleural effusion.  Hypodense blood pool relative to myocardium, suggesting anemia. Pacemaker leads, incompletely visualized.  Calcified hepatic and splenic granulomata.  Pancreas and right adrenal gland are within normal limits. Minimal nodular thickening of the left adrenal gland, likely reflecting an 8 mm left adrenal adenoma (series 2/image 24).  Gallbladder is unremarkable. No intrahepatic or extrahepatic ductal dilatation.  Mild fullness of the bilateral renal collecting systems with indwelling double pigtail ureteral stents. Kidneys are otherwise unremarkable. No renal calculi.  No evidence of bowel obstruction.  Atherosclerotic calcifications of the abdominal aorta and branch vessels.  No abdominopelvic ascites.  No suspicious abdominopelvic lymphadenopathy.  Brachytherapy seeds in the prostate.  No ureteral or bladder calculi. Bladder is moderately distended with an indwelling Foley catheter. Associated nondependent gas within the bladder. Suspected layering debris/hemorrhage within the bladder (series 2/image 35), although abnormal soft tissue is also possible.  Mild degenerative changes of the visualized thoracolumbar spine, most prominent at L1-2.  IMPRESSION: No renal, ureteral, or bladder calculi.  Mild fullness of the bilateral renal collecting systems, improved, with indwelling double pigtail ureteral stents.  Bladder is moderately distended with an indwelling Foley catheter. If catheter was not clamped prior to CT, correlate for catheter patency.  Suspected layering debris/hemorrhage within the bladder,  although abnormal soft tissue is also possible. Correlate with recent cystoscopy.  Brachytherapy seeds in the prostate. No findings to suggest metastatic disease in the abdomen/pelvis.   Electronically Signed   By: Julian Hy M.D.   On: 06/13/2013 13:28   Dg Abd Acute W/chest  06/13/2013   CLINICAL DATA:  Lower left back pain for 2 days, bilateral ureteral stents placed 05/30/2013, gross hematuria.  EXAM: ACUTE ABDOMEN SERIES (ABDOMEN 2 VIEW & CHEST 1 VIEW)  COMPARISON:  Multiple recent prior studies  FINDINGS: Heart size and vascular pattern are normal. Lungs are clear. Cardiac pacer stable. Emphysematous change stable.  No free air  in the abdomen. Bilateral ureteral stents identified. Distal loop of right ureteral stent is mostly uncoiled. Brachytherapy seeds are identified in the prostate. There is a nonobstructive bowel gas pattern.  IMPRESSION: No acute cardiopulmonary abnormalities next item bilateral ureteral stents in anticipated position. Distal loop of right stent is mostly uncoiled.   Electronically Signed   By: Skipper Cliche M.D.   On: 06/13/2013 11:12     EKG Interpretation None      MDM   Final diagnoses:  Urinary retention    Patient presents with urinary retention. Recent recurrence of prostate cancer into the bladder neck. He is nontoxic on exam. He is tender to palpation. Will place Foley catheter and obtain urinalysis.  12:30 pm Patient with persistent suprapubic pain following catheter placement.  Will obtain noncontrasted CT given persistent pain.  4:34 PM CT reviewed by myself.  The Foley catheter appears in place; however, it urinary bladder is very distended and likely the source of the patient's pain. Attempts to irrigate Foley unsuccessful. Foley removed and larger Foley placed. Foley was irrigated and moderate amount of debris evacuated with successful evacuation of urine from the bladder appear.  Urinalysis without evidence of UTI. Discussed with on-call  urology. Will maintain indwelling Foley and he will followup. Patient also set up with home health via case management for Foley catheter help.  Merryl Hacker, MD 06/13/13 [phone]

## 2013-06-13 NOTE — Progress Notes (Addendum)
  CARE MANAGEMENT ED NOTE 06/14/2013  Patient:  [name], [name]   Account Number:  [id number]  Date Initiated:  06/13/2013  Documentation initiated by:  [facility]  Subjective/Objective Assessment:   This is a 80 year old with history of a stroke fibrillation, prostate cancer with recent recurrence invading into the bladder and requiring bilateral stents for hydronephrosis who presents with dysuria and urgency     Subjective/Objective Assessment Detail:   Pt report lower abdominal pain. He is unable to fully urinate for the last 8-12 hours.  Currently pain is 8/10. He has not taken anything for pain. He denies any nausea, vomiting, or diarrhea. He does report chronic constipation.in ED  Catheter was replace with larger catheter. Foley was irrigated and moderate amount of debris evacuated with successful evacuation of urine from the bladder appear     Action/Plan:   Discharge Home with [facility] with management of indwelling urinary catheter   Action/Plan Detail:   Anticipated DC Date:  06/13/2013     Status Recommendation to Physician:   Result of Recommendation:  Agreed  Other ED Services  Appropriate Status [place]  CM consult   [facility] Choice  HOME HEALTH   Choice offered to / List presented to:  C-1 Patient     [place] arranged  HH-1 RN      [place]    Status of service:  Completed, signed off  ED Comments:   ED Comments Detail:  06/13/13 14:36 Wendi Maya RN BSN [phone] ED CM consulted by C. Horton for possible HH. Pt presented to [facility] ED  With dysuria and urgency.Pt has  prostate cancer with recent recurrence invading into the bladder and requiring bilateral stents for hydronephrosis has indwelling urinary catheter.  [employer] Medicare coverage. Met with patient and family at bedside, confirmed information. Patient reports living at home with wife who is primary caregiver. He was recently discharged home with Urinary catheter to manage  at home, patient was experiencing urinary retention and returned to ED . Discussed the recommendations for [facility].for management of the urinary catheter, Patient and family agreeable to [facility] plan. Choice offered list given. Patient and family decided upon [facility] both patient and wife stated, they were happy with services they received from [place]. Referral was faxed in to [place] [phone] fax confirmation was received. Verified address and contact information with patient and family, and expalined that someone from [facility] will contact them within 24-48 hrs patient and family agreeable. [place] brochure given along with my contact information if any further questions or concerns should arise. Pt and family verbalized understanding and appreciation teach back done. No further ED CM needs identified.

## 2013-06-13 NOTE — ED Notes (Signed)
Pt states lower abdominal pain and urinary retention. Onset last night. States he had difficulty urinating. 8/10 pain at the time. States recent bilateral kidney stent placement.

## 2013-06-13 NOTE — ED Notes (Signed)
Pt presents to department for evaluation of lower abdominal pain and discomfort with urination. States he recently had stent placed in kidneys. Now states urinary retention and pain to lower abdominal region. 8/10 upon arrival. No nausea/vomiting. Pt is alert and oriented x4.

## 2013-06-13 NOTE — Discharge Instructions (Signed)
Acute Urinary Retention, Male °Acute urinary retention is the temporary inability to urinate. °This is a common problem in older men. As men age their prostates become larger and block the flow of urine from the bladder. This is usually a problem that has come on gradually.  °HOME CARE INSTRUCTIONS °If you are sent home with a Foley catheter and a drainage system, you will need to discuss the best course of action with your health care provider. While the catheter is in, maintain a good intake of fluids. Keep the drainage bag emptied and lower than your catheter. This is so that contaminated urine will not flow back into your bladder, which could lead to a urinary tract infection. °There are two main types of drainage bags. One is a large bag that usually is used at night. It has a good capacity that will allow you to sleep through the night without having to empty it. The second type is called a leg bag. It has a smaller capacity, so it needs to be emptied more frequently. However, the main advantage is that it can be attached by a leg strap and can go underneath your clothing, allowing you the freedom to move about or leave your home. °Only take over-the-counter or prescription medicines for pain, discomfort, or fever as directed by your health care provider.  °SEEK MEDICAL CARE IF: °· You develop a low-grade fever. °· You experience spasms or leakage of urine with the spasms. °SEEK IMMEDIATE MEDICAL CARE IF:  °· You develop chills or fever. °· Your catheter stops draining urine. °· Your catheter falls out. °· You start to develop increased bleeding that does not respond to rest and increased fluid intake. °MAKE SURE YOU: °· Understand these instructions. °· Will watch your condition. °· Will get help right away if you are not doing well or get worse. °Document Released: 04/08/2000 Document Revised: 09/02/2012 Document Reviewed: 06/11/2012 °ExitCare® Patient Information ©2014 [employer]. °Foley Catheter Care,  Adult °A Foley catheter is a soft, flexible tube that is placed into the bladder to drain urine. A Foley catheter may be inserted if: °· You leak urine or are not able to control when you urinate (urinary incontinence). °· You are not able to urinate when you need to (urinary retention). °· You had prostate surgery or surgery on the genitals. °· You have certain medical conditions, such as multiple sclerosis, dementia, or a spinal cord injury. °If you are going home with a Foley catheter in place, follow the instructions below. °TAKING CARE OF THE CATHETER °1. Wash your hands with soap and water. °2. Using mild soap and warm water on a clean washcloth: °· Clean the area on your body closest to the catheter insertion site using a circular motion, moving away from the catheter. Never wipe toward the catheter because this could sweep bacteria up into the urethra and cause infection. °· Remove all traces of soap. Pat the area dry with a clean towel. For males, reposition the foreskin. °3. Attach the catheter to your leg so there is no tension on the catheter. Use adhesive tape or a leg strap. If you are using adhesive tape, remove any sticky residue left behind by the previous tape you used. °4. Keep the drainage bag below the level of the bladder, but keep it off the floor. °5. Check throughout the day to be sure the catheter is working and urine is draining freely. Make sure the tubing does not become kinked. °6. Do not pull on   the catheter or try to remove it. Pulling could damage internal tissues. °TAKING CARE OF THE DRAINAGE BAGS °You will be given two drainage bags to take home. One is a large overnight drainage bag, and the other is a smaller leg bag that fits underneath clothing. You may wear the overnight bag at any time, but you should never wear the smaller leg bag at night. Follow the instructions below for how to empty, change, and clean your drainage bags. °Emptying the Drainage Bag °You must empty your  drainage bag when it is   ½ full or at least 2 3 times a day. °1. Wash your hands with soap and water. °2. Keep the drainage bag below your hips, below the level of your bladder. This stops urine from going back into the tubing and into your bladder. °3. Hold the dirty bag over the toilet or a clean container. °4. Open the pour spout at the bottom of the bag and empty the urine into the toilet or container. Do not let the pour spout touch the toilet, container, or any other surface. Doing so can place bacteria on the bag, which can cause an infection. °5. Clean the pour spout with a gauze pad or cotton ball that has rubbing alcohol on it. °6. Close the pour spout. °7. Attach the bag to your leg with adhesive tape or a leg strap. °8. Wash your hands well. °Changing the Drainage Bag °Change your drainage bag once a month or sooner if it starts to smell bad or look dirty. Below are steps to follow when changing the drainage bag. °1. Wash your hands with soap and water. °2. Pinch off the rubber catheter so that urine does not spill out. °3. Disconnect the catheter tube from the drainage tube at the connection valve. Do not let the tubes touch any surface. °4. Clean the end of the catheter tube with an alcohol wipe. Use a different alcohol wipe to clean the end of the drainage tube. °5. Connect the catheter tube to the drainage tube of the clean drainage bag. °6. Attach the new bag to the leg with adhesive tape or a leg strap. Avoid attaching the new bag too tightly. °7. Wash your hands well. °Cleaning the Drainage Bag °1. Wash your hands with soap and water. °2. Wash the bag in warm, soapy water. °3. Rinse the bag thoroughly with warm water. °4. Fill the bag with a solution of white vinegar and water (1 cup vinegar to 1 qt warm water [.2 L vinegar to 1 L warm water]). Close the bag and soak it for 30 minutes in the solution. °5. Rinse the bag with warm water. °6. Hang the bag to dry with the pour spout open and hanging  downward. °7. Store the clean bag (once it is dry) in a clean plastic bag. °8. Wash your hands well. °PREVENTING INFECTION °· Wash your hands before and after handling your catheter. °· Take showers daily and wash the area where the catheter enters your body. Do not take baths. Replace wet leg straps with dry ones, if this applies. °· Do not use powders, sprays, or lotions on the genital area. Only use creams, lotions, or ointments as directed by your caregiver. °· For females, wipe from front to back after each bowel movement. °· Drink enough fluids to keep your urine clear or pale yellow unless you have a fluid restriction. °· Do not let the drainage bag or tubing touch or lie on the floor. °·   Wear cotton underwear to absorb moisture and to keep your skin drier. °SEEK MEDICAL CARE IF:  °· Your urine is cloudy or smells unusually bad. °· Your catheter becomes clogged. °· You are not draining urine into the bag or your bladder feels full. °· Your catheter starts to leak. °SEEK IMMEDIATE MEDICAL CARE IF:  °· You have pain, swelling, redness, or pus where the catheter enters the body. °· You have pain in the abdomen, legs, lower back, or bladder. °· You have a fever. °· You see blood fill the catheter, or your urine is pink or red. °· You have nausea, vomiting, or chills. °· Your catheter gets pulled out. °MAKE SURE YOU:  °· Understand these instructions. °· Will watch your condition. °· Will get help right away if you are not doing well or get worse. °Document Released: 12/31/2004 Document Revised: 04/27/2012 Document Reviewed: 12/23/2011 °ExitCare® Patient Information ©2014 [employer]. ° °

## 2013-06-14 LAB — URINE CULTURE
COLONY COUNT: NO GROWTH
Culture: NO GROWTH

## 2013-06-15 ENCOUNTER — Emergency Department (HOSPITAL_COMMUNITY)
Admission: EM | Admit: 2013-06-15 | Discharge: 2013-06-15 | Disposition: A | Discharge status: Home / Self Care | Payer: Medicare Other | Attending: Emergency Medicine | Admitting: Emergency Medicine

## 2013-06-15 ENCOUNTER — Encounter (HOSPITAL_COMMUNITY): Payer: Self-pay | Admitting: Emergency Medicine

## 2013-06-15 DIAGNOSIS — Z8546 Personal history of malignant neoplasm of prostate: Secondary | ICD-10-CM | POA: Insufficient documentation

## 2013-06-15 DIAGNOSIS — Z8679 Personal history of other diseases of the circulatory system: Secondary | ICD-10-CM | POA: Insufficient documentation

## 2013-06-15 DIAGNOSIS — Z7901 Long term (current) use of anticoagulants: Secondary | ICD-10-CM | POA: Insufficient documentation

## 2013-06-15 DIAGNOSIS — R339 Retention of urine, unspecified: Secondary | ICD-10-CM | POA: Insufficient documentation

## 2013-06-15 DIAGNOSIS — Z79899 Other long term (current) drug therapy: Secondary | ICD-10-CM | POA: Insufficient documentation

## 2013-06-15 DIAGNOSIS — D500 Iron deficiency anemia secondary to blood loss (chronic): Secondary | ICD-10-CM | POA: Insufficient documentation

## 2013-06-15 DIAGNOSIS — Z8669 Personal history of other diseases of the nervous system and sense organs: Secondary | ICD-10-CM | POA: Insufficient documentation

## 2013-06-15 LAB — URINALYSIS, ROUTINE W REFLEX MICROSCOPIC
BILIRUBIN URINE: NEGATIVE
GLUCOSE, UA: NEGATIVE mg/dL
KETONES UR: NEGATIVE mg/dL
Nitrite: NEGATIVE
PROTEIN: 100 mg/dL — AB
Specific Gravity, Urine: 1.014 (ref 1.005–1.030)
Urobilinogen, UA: 0.2 mg/dL (ref 0.0–1.0)
pH: 5 (ref 5.0–8.0)

## 2013-06-15 LAB — URINE MICROSCOPIC-ADD ON

## 2013-06-15 NOTE — ED Provider Notes (Signed)
CSN: [id number]     Arrival date & time 06/15/13  0141 History   First MD Initiated Contact with Patient 06/15/13 0153     Chief Complaint  Patient presents with  . Urinary Retention     (Consider location/radiation/quality/duration/timing/severity/associated sxs/prior Treatment) HPI Comments: The patient is a 80 year old male with a history of recent diagnosis of urinary obstruction secondary to prostate cancer. He was recently admitted to the hospital according to the family's report where he had urinary ureteral stents placed to facilitate urinary drainage. A Foley catheter was necessary a couple of times over the last week secondary to difficulty with urinary output because of obstruction. He was last seen 2 days ago where a Foley catheter was replaced and it has been draining appropriately until 10:30 tonight after which time there has been no urinary output. He has a slight increase in the amount of abdominal discomfort and a feeling of needing to urinate but denies fevers chills nausea or vomiting. He was placed on antibiotics for urine infection last week. He has been taking them appropriately. Nothing seems to make this better or worse.  The history is provided by the patient, medical records and a relative.    Past Medical History  Diagnosis Date  . Bradycardia     s/p PPM  . Atrial flutter     s/p ablation  . Atrial fibrillation   . Prostate cancer 12-21-2003    12-7-seed implants done  . Hematoma   . Mitral regurgitation     mild echo 2012  . Pacemaker --[employer]   . Orthostatic lightheadedness   . Subdural hematoma   . Anemia due to GI blood loss   . Prostate cancer    Past Surgical History  Procedure Laterality Date  . Tonsillectomy  as child  . Arthrscopic left knee surgery  02-11-2007  . Cardioversion  09-04-2006  . Av node ablation  04-06-2007  . Subdural hematoma evacuation via craniotomy  12-16-2007  . Rotator cuff repair w/ distal clavicle excision   08-31-2010    left  . Pacemaker insertion      [employer]   . Total knee arthroplasty  12/18/2010    Procedure: TOTAL KNEE ARTHROPLASTY;  Surgeon: Mauri Pole;  Location: [facility];  Service: Orthopedics;  Laterality: Left;  . Joint replacement    . Brain surgery    . Cardioversion    . Cataract extraction    . Left knee arthroscopy    . Cystoscopy w/ ureteral stent placement Bilateral 05/30/2013    Procedure: CYSTOSCOPY WITH RETROGRADE PYELOGRAM/ BILATERAL URETERAL STENT PLACEMENT, TRANSURETRAL RESECTION OF BLADDER TUMOR WITH GYRUS;  Surgeon: Alexis Frock, MD;  Location: [facility];  Service: Urology;  Laterality: Bilateral;   Family History  Problem Relation Age of Onset  . Aneurysm Father   . Stroke Father    History  Substance Use Topics  . Smoking status: Never Smoker   . Smokeless tobacco: Never Used  . Alcohol Use: 7.7 oz/week    7 Glasses of wine, 7 Drinks containing 0.5 oz of alcohol per week     Comment: 2 glasses wine day    Review of Systems  Gastrointestinal: Negative for nausea and vomiting.  Genitourinary: Positive for decreased urine volume. Negative for hematuria, flank pain, discharge and penile pain.      Allergies  Review of patient's allergies indicates no known allergies.  Home Medications   Prior to Admission medications   Medication Sig Start Date End Date  Taking? Authorizing Provider  acetaminophen (TYLENOL) 500 MG tablet Take 500 mg by mouth every 6 (six) hours as needed for moderate pain.   Yes Historical Provider, MD  calcium citrate-vitamin D (CITRACAL+D) 315-200 MG-UNIT per tablet Take 1 tablet by mouth 2 (two) times daily.   Yes Historical Provider, MD  Carboxymethylcellul-Glycerin (LUBRICATING EYE DROPS) 0.5-0.9 % SOLN Apply 1 drop to eye daily as needed (dry eyes).    Yes Historical Provider, MD  docusate sodium (COLACE) 100 MG capsule Take 1 capsule (100 mg total) by mouth every 12 (twelve) hours. 06/13/13  Yes Merryl Hacker, MD   ferrous sulfate 325 (65 FE) MG tablet Take 325 mg by mouth daily with breakfast.   Yes Historical Provider, MD  Glucosamine-Chondroit-Vit C-Mn (GLUCOSAMINE 1500 COMPLEX) CAPS Take 1 capsule by mouth 2 (two) times daily.   Yes Historical Provider, MD  HYDROcodone-acetaminophen (NORCO/VICODIN) 5-325 MG per tablet Take 1-2 tablets by mouth every 6 (six) hours as needed for moderate pain or severe pain. 06/13/13  Yes Merryl Hacker, MD  levothyroxine (SYNTHROID, LEVOTHROID) 137 MCG tablet Take 137 mcg by mouth daily before breakfast.   Yes Historical Provider, MD  Melatonin 3 MG TABS Take 3 mg by mouth at bedtime.    Yes Historical Provider, MD  Methylfol-Methylcob-Acetylcyst (CEREFOLIN NAC) 6-2-600 MG TABS Take 1 tablet by mouth daily.   Yes Historical Provider, MD  mometasone-formoterol (DULERA) 100-5 MCG/ACT AERO Inhale 2 puffs into the lungs 2 (two) times daily.   Yes Historical Provider, MD  Multiple Vitamin (MULTIVITAMIN WITH MINERALS) TABS tablet Take 1 tablet by mouth daily.   Yes Historical Provider, MD  Omega-3 Fatty Acids (FISH OIL) 500 MG CAPS Take 500 mg by mouth 2 (two) times daily.   Yes Historical Provider, MD  propranolol ER (INDERAL LA) 60 MG 24 hr capsule Take 60 mg by mouth daily.   Yes Historical Provider, MD  solifenacin (VESICARE) 10 MG tablet Take 10 mg by mouth daily.   Yes Historical Provider, MD  tamsulosin (FLOMAX) 0.4 MG CAPS capsule Take 0.4 mg by mouth daily.   Yes Historical Provider, MD  rivaroxaban (XARELTO) 20 MG TABS tablet Take 20 mg by mouth daily.     Historical Provider, MD   BP 102/68  Pulse 75  Temp(Src) 98.1 F (36.7 C) (Oral)  Resp 20  SpO2 99% Physical Exam  Nursing note and vitals reviewed. Constitutional: He appears well-developed and well-nourished. No distress.  HENT:  Head: Normocephalic and atraumatic.  Mouth/Throat: Oropharynx is clear and moist. No oropharyngeal exudate.  Eyes: Conjunctivae and EOM are normal. Pupils are equal, round, and  reactive to light. Right eye exhibits no discharge. Left eye exhibits no discharge. No scleral icterus.  Neck: Normal range of motion. Neck supple. No JVD present. No thyromegaly present.  Pulmonary/Chest: Effort normal.  Abdominal: Soft. Bowel sounds are normal. He exhibits no distension and no mass. There is no tenderness.  Genitourinary:  Foley catheter seated appropriately in the urethra, no drainage discharge or blood from around the catheter, no drainage in the catheter.  Musculoskeletal: Normal range of motion. He exhibits no edema and no tenderness.  Lymphadenopathy:    He has no cervical adenopathy.  Neurological: He is alert. Coordination normal.  Skin: Skin is warm and dry. No rash noted. No erythema.  Psychiatric: He has a normal mood and affect. His behavior is normal.    ED Course  Procedures (including critical care time) Labs Review Labs Reviewed  URINALYSIS, ROUTINE W  REFLEX MICROSCOPIC - Abnormal; Notable for the following:    APPearance CLOUDY (*)    Hgb urine dipstick LARGE (*)    Protein, ur 100 (*)    Leukocytes, UA MODERATE (*)    All other components within normal limits  URINE MICROSCOPIC-ADD ON - Abnormal; Notable for the following:    Bacteria, UA FEW (*)    All other components within normal limits  URINE CULTURE    Imaging Review Ct Abdomen Pelvis Wo Contrast  06/13/2013   CLINICAL DATA:  Lower abdominal pain, urinary retention, recent bilateral renal stent placement. History of prostate cancer with brachytherapy seeds.  EXAM: CT ABDOMEN AND PELVIS WITHOUT CONTRAST  TECHNIQUE: Multidetector CT imaging of the abdomen and pelvis was performed following the standard protocol without IV contrast.  COMPARISON:  05/30/2013  FINDINGS: Trace left pleural effusion.  Hypodense blood pool relative to myocardium, suggesting anemia. Pacemaker leads, incompletely visualized.  Calcified hepatic and splenic granulomata.  Pancreas and right adrenal gland are within normal  limits. Minimal nodular thickening of the left adrenal gland, likely reflecting an 8 mm left adrenal adenoma (series 2/image 24).  Gallbladder is unremarkable. No intrahepatic or extrahepatic ductal dilatation.  Mild fullness of the bilateral renal collecting systems with indwelling double pigtail ureteral stents. Kidneys are otherwise unremarkable. No renal calculi.  No evidence of bowel obstruction.  Atherosclerotic calcifications of the abdominal aorta and branch vessels.  No abdominopelvic ascites.  No suspicious abdominopelvic lymphadenopathy.  Brachytherapy seeds in the prostate.  No ureteral or bladder calculi. Bladder is moderately distended with an indwelling Foley catheter. Associated nondependent gas within the bladder. Suspected layering debris/hemorrhage within the bladder (series 2/image 35), although abnormal soft tissue is also possible.  Mild degenerative changes of the visualized thoracolumbar spine, most prominent at L1-2.  IMPRESSION: No renal, ureteral, or bladder calculi.  Mild fullness of the bilateral renal collecting systems, improved, with indwelling double pigtail ureteral stents.  Bladder is moderately distended with an indwelling Foley catheter. If catheter was not clamped prior to CT, correlate for catheter patency.  Suspected layering debris/hemorrhage within the bladder, although abnormal soft tissue is also possible. Correlate with recent cystoscopy.  Brachytherapy seeds in the prostate. No findings to suggest metastatic disease in the abdomen/pelvis.   Electronically Signed   By: Julian Hy M.D.   On: 06/13/2013 13:28   Dg Abd Acute W/chest  06/13/2013   CLINICAL DATA:  Lower left back pain for 2 days, bilateral ureteral stents placed 05/30/2013, gross hematuria.  EXAM: ACUTE ABDOMEN SERIES (ABDOMEN 2 VIEW & CHEST 1 VIEW)  COMPARISON:  Multiple recent prior studies  FINDINGS: Heart size and vascular pattern are normal. Lungs are clear. Cardiac pacer stable. Emphysematous  change stable.  No free air in the abdomen. Bilateral ureteral stents identified. Distal loop of right ureteral stent is mostly uncoiled. Brachytherapy seeds are identified in the prostate. There is a nonobstructive bowel gas pattern.  IMPRESSION: No acute cardiopulmonary abnormalities next item bilateral ureteral stents in anticipated position. Distal loop of right stent is mostly uncoiled.   Electronically Signed   By: Skipper Cliche M.D.   On: 06/13/2013 11:12      MDM   Final diagnoses:  Urinary retention    No abdominal pain or distention, vital signs normal, catheter appears to be dysfunctioning and will need to be her gait or replaced, patient otherwise appears stable.  Urinalysis with moderate leukocytes, few bacteria, likely colonized, culture pending, patient is stable for discharge, catheter now flows  after being flushed.  Johnna Acosta, MD 06/15/13 [id number]

## 2013-06-15 NOTE — ED Notes (Addendum)
Pt reports not having any urine output since 10 pm last night when bag was changed.  Foley irrigated with NS, 300 cc urine irrigated out.  Brown sediment noted in urine.

## 2013-06-15 NOTE — ED Notes (Signed)
Patient had a urinary blockage earlier in the day yesterday, a new catheter was placed.  Patient now with no urine output for over 4 hours.

## 2013-06-15 NOTE — ED Notes (Signed)
Pt discharged with foley catheter in place.  Leg bag changed to standard draining bag.  Good UO noted.  No pain nor distress noted.  Pt wheeled out in wheelchair to vehicle.

## 2013-06-15 NOTE — Discharge Instructions (Signed)
Return as needed for catheter dysfunction

## 2013-06-16 ENCOUNTER — Other Ambulatory Visit: Payer: Self-pay | Admitting: Urology

## 2013-06-16 LAB — URINE CULTURE
Colony Count: NO GROWTH
Culture: NO GROWTH

## 2013-07-09 ENCOUNTER — Encounter (HOSPITAL_COMMUNITY): Payer: Self-pay | Admitting: Emergency Medicine

## 2013-07-09 ENCOUNTER — Emergency Department (HOSPITAL_COMMUNITY)
Admission: EM | Admit: 2013-07-09 | Discharge: 2013-07-09 | Disposition: A | Discharge status: Home / Self Care | Payer: Medicare Other | Attending: Emergency Medicine | Admitting: Emergency Medicine

## 2013-07-09 DIAGNOSIS — N390 Urinary tract infection, site not specified: Secondary | ICD-10-CM | POA: Insufficient documentation

## 2013-07-09 DIAGNOSIS — Z8546 Personal history of malignant neoplasm of prostate: Secondary | ICD-10-CM | POA: Insufficient documentation

## 2013-07-09 DIAGNOSIS — Z79899 Other long term (current) drug therapy: Secondary | ICD-10-CM | POA: Insufficient documentation

## 2013-07-09 DIAGNOSIS — Z8679 Personal history of other diseases of the circulatory system: Secondary | ICD-10-CM | POA: Insufficient documentation

## 2013-07-09 DIAGNOSIS — Z950 Presence of cardiac pacemaker: Secondary | ICD-10-CM | POA: Insufficient documentation

## 2013-07-09 LAB — URINE MICROSCOPIC-ADD ON

## 2013-07-09 LAB — CBC WITH DIFFERENTIAL/PLATELET
BASOS ABS: 0 10*3/uL (ref 0.0–0.1)
BASOS PCT: 0 % (ref 0–1)
Eosinophils Absolute: 0 10*3/uL (ref 0.0–0.7)
Eosinophils Relative: 1 % (ref 0–5)
HCT: 29.2 % — ABNORMAL LOW (ref 39.0–52.0)
Hemoglobin: 9.9 g/dL — ABNORMAL LOW (ref 13.0–17.0)
Lymphocytes Relative: 8 % — ABNORMAL LOW (ref 12–46)
Lymphs Abs: 0.7 10*3/uL (ref 0.7–4.0)
MCH: 32.8 pg (ref 26.0–34.0)
MCHC: 33.9 g/dL (ref 30.0–36.0)
MCV: 96.7 fL (ref 78.0–100.0)
MONO ABS: 0.7 10*3/uL (ref 0.1–1.0)
Monocytes Relative: 9 % (ref 3–12)
NEUTROS ABS: 6.8 10*3/uL (ref 1.7–7.7)
NEUTROS PCT: 82 % — AB (ref 43–77)
Platelets: 152 10*3/uL (ref 150–400)
RBC: 3.02 MIL/uL — ABNORMAL LOW (ref 4.22–5.81)
RDW: 12.7 % (ref 11.5–15.5)
WBC: 8.2 10*3/uL (ref 4.0–10.5)

## 2013-07-09 LAB — COMPREHENSIVE METABOLIC PANEL
ALBUMIN: 3.6 g/dL (ref 3.5–5.2)
ALT: 18 U/L (ref 0–53)
AST: 22 U/L (ref 0–37)
Alkaline Phosphatase: 83 U/L (ref 39–117)
BUN: 32 mg/dL — AB (ref 6–23)
CHLORIDE: 98 meq/L (ref 96–112)
CO2: 20 mEq/L (ref 19–32)
CREATININE: 1.22 mg/dL (ref 0.50–1.35)
Calcium: 8.8 mg/dL (ref 8.4–10.5)
GFR calc Af Amer: 63 mL/min — ABNORMAL LOW (ref >90)
GFR calc non Af Amer: 54 mL/min — ABNORMAL LOW (ref >90)
Glucose, Bld: 132 mg/dL — ABNORMAL HIGH (ref 70–99)
Potassium: 4.1 mEq/L (ref 3.7–5.3)
Sodium: 133 mEq/L — ABNORMAL LOW (ref 137–147)
TOTAL PROTEIN: 6.9 g/dL (ref 6.0–8.3)
Total Bilirubin: 0.4 mg/dL (ref 0.3–1.2)

## 2013-07-09 LAB — URINALYSIS, ROUTINE W REFLEX MICROSCOPIC
Bilirubin Urine: NEGATIVE
GLUCOSE, UA: NEGATIVE mg/dL
Ketones, ur: NEGATIVE mg/dL
Nitrite: POSITIVE — AB
PH: 5 (ref 5.0–8.0)
Protein, ur: 100 mg/dL — AB
SPECIFIC GRAVITY, URINE: 1.019 (ref 1.005–1.030)
Urobilinogen, UA: 0.2 mg/dL (ref 0.0–1.0)

## 2013-07-09 LAB — I-STAT CG4 LACTIC ACID, ED: LACTIC ACID, VENOUS: 1.1 mmol/L (ref 0.5–2.2)

## 2013-07-09 MED ORDER — CEPHALEXIN 500 MG PO CAPS: 500.0000 mg | ORAL_CAPSULE | Freq: Three times a day (TID) | ORAL | Status: DC

## 2013-07-09 MED ORDER — DEXTROSE 5 % IV SOLN
1.0000 g | INTRAVENOUS | Status: DC
  Administered 2013-07-09: 1 g via INTRAVENOUS
  Filled 2013-07-09 (×2): qty 10

## 2013-07-09 NOTE — ED Notes (Addendum)
Presents with fever began last night associated with chills and weakness. Fever as high as 101.8 at home orally. GIven advil before arrival to ED- at 5 pm. Recent hx of urinary catheter and self cath at home. Foley was removed Wednesday. Self cath has not produced much urine.  Pt alert and oriented.

## 2013-07-09 NOTE — ED Provider Notes (Signed)
Medical screening examination/treatment/procedure(s) were performed by non-physician practitioner and as supervising physician I was immediately available for consultation/collaboration.   EKG Interpretation None       Richarda Blade, MD 07/09/13 2350

## 2013-07-09 NOTE — Discharge Instructions (Signed)
Take the prescribed medication as directed.  Continue tylenol or motrin as needed for fever. Monitor urine output.  If no urine output in 6-8 hours or if dribbling continues and do not feel bladder is emptying all the way, consider self cath. Follow-up with Dr. Diona Fanti-- call and schedule appt. Return to the ED for new or worsening symptoms-- no urine output in 12+ hours, high fever, abdominal pain, nausea, vomiting, changes in mental status, confusion, etc.

## 2013-07-09 NOTE — ED Notes (Signed)
CG-4 Reported to [employer]

## 2013-07-09 NOTE — ED Provider Notes (Signed)
CSN: [id number]     Arrival date & time 07/09/13  1741 History   First MD Initiated Contact with Patient 07/09/13 1817     Chief Complaint  Patient presents with  . Fever     (Consider location/radiation/quality/duration/timing/severity/associated sxs/prior Treatment) The history is provided by the patient and medical records.   This is an 80 year old male with past medical history significant for AFIB, prostate cancer s/p partial resection and seed implanting, presenting to the ED for fever.  Pt states last night he started feeling very fatigued and chilled so he went to bed early.  States woke up this morning and noticed that he felt warm, wife took temperature and was 101.35F so gave advil.  States he is feeling somewhat better now. Patient is s/p bilateral ureteral stent placement for urinary obstruction (secondary to excess prostate tissue) by Dr. Tresa Moore and had an indwelling foley catheter that was removed on Wednesday, 07/07/13.   Pt states he has not had much urine output over the past 2 days, mostly dribbles so did have to self cath earlier today.  Pt is followed regularly by urology, Dr. Diona Fanti.  Does endorse some dysuria but no noted hematuria or flank pain.  No chest pain, SOB, palpitations, dizziness, weakness.  No cough, nasal congestion, sore throat, ear pain, or other URI sx.  VS stable on arrival.  Past Medical History  Diagnosis Date  . Bradycardia     s/p PPM  . Atrial flutter     s/p ablation  . Atrial fibrillation   . Prostate cancer 12-21-2003    12-7-seed implants done  . Hematoma   . Mitral regurgitation     mild echo 2012  . Pacemaker --[employer]   . Orthostatic lightheadedness   . Subdural hematoma   . Anemia due to GI blood loss   . Prostate cancer    Past Surgical History  Procedure Laterality Date  . Tonsillectomy  as child  . Arthrscopic left knee surgery  02-11-2007  . Cardioversion  09-04-2006  . Av node ablation  04-06-2007  . Subdural  hematoma evacuation via craniotomy  12-16-2007  . Rotator cuff repair w/ distal clavicle excision  08-31-2010    left  . Pacemaker insertion      [employer] Insignia   . Total knee arthroplasty  12/18/2010    Procedure: TOTAL KNEE ARTHROPLASTY;  Surgeon: Mauri Pole;  Location: [facility];  Service: Orthopedics;  Laterality: Left;  . Joint replacement    . Brain surgery    . Cardioversion    . Cataract extraction    . Left knee arthroscopy    . Cystoscopy w/ ureteral stent placement Bilateral 05/30/2013    Procedure: CYSTOSCOPY WITH RETROGRADE PYELOGRAM/ BILATERAL URETERAL STENT PLACEMENT, TRANSURETRAL RESECTION OF BLADDER TUMOR WITH GYRUS;  Surgeon: Alexis Frock, MD;  Location: [facility];  Service: Urology;  Laterality: Bilateral;   Family History  Problem Relation Age of Onset  . Aneurysm Father   . Stroke Father    History  Substance Use Topics  . Smoking status: Never Smoker   . Smokeless tobacco: Never Used  . Alcohol Use: 7.7 oz/week    7 Glasses of wine, 7 Drinks containing 0.5 oz of alcohol per week     Comment: 2 glasses wine day    Review of Systems  Constitutional: Positive for fever and chills.  Genitourinary: Positive for dysuria, decreased urine volume and difficulty urinating.  All other systems reviewed and are negative.  Allergies  Review of patient's allergies indicates no known allergies.  Home Medications   Prior to Admission medications   Medication Sig Start Date End Date Taking? Authorizing Provider  calcium citrate-vitamin D (CITRACAL+D) 315-200 MG-UNIT per tablet Take 1 tablet by mouth 2 (two) times daily.   Yes Historical Provider, MD  Carboxymethylcellul-Glycerin (LUBRICATING EYE DROPS) 0.5-0.9 % SOLN Apply 1 drop to eye daily as needed (dry eyes).    Yes Historical Provider, MD  docusate sodium (COLACE) 100 MG capsule Take 1 capsule (100 mg total) by mouth every 12 (twelve) hours. 06/13/13  Yes Merryl Hacker, MD  Glucosamine-Chondroit-Vit C-Mn  (GLUCOSAMINE 1500 COMPLEX) CAPS Take 1 capsule by mouth 2 (two) times daily.   Yes Historical Provider, MD  HYDROcodone-acetaminophen (NORCO/VICODIN) 5-325 MG per tablet Take 1-2 tablets by mouth every 6 (six) hours as needed for moderate pain or severe pain. 06/13/13  Yes Merryl Hacker, MD  ibuprofen (ADVIL,MOTRIN) 200 MG tablet Take 200 mg by mouth every 6 (six) hours as needed.   Yes Historical Provider, MD  levothyroxine (SYNTHROID, LEVOTHROID) 137 MCG tablet Take 137 mcg by mouth daily before breakfast.   Yes Historical Provider, MD  Melatonin 3 MG TABS Take 3 mg by mouth at bedtime.    Yes Historical Provider, MD  Methylfol-Methylcob-Acetylcyst (CEREFOLIN NAC) 6-2-600 MG TABS Take 1 tablet by mouth daily.   Yes Historical Provider, MD  Multiple Vitamin (MULTIVITAMIN WITH MINERALS) TABS tablet Take 1 tablet by mouth daily.   Yes Historical Provider, MD  Omega-3 Fatty Acids (FISH OIL) 500 MG CAPS Take 500 mg by mouth 2 (two) times daily.   Yes Historical Provider, MD  propranolol ER (INDERAL LA) 60 MG 24 hr capsule Take 60 mg by mouth daily.   Yes Historical Provider, MD  tamsulosin (FLOMAX) 0.4 MG CAPS capsule Take 0.4 mg by mouth daily.   Yes Historical Provider, MD  solifenacin (VESICARE) 10 MG tablet Take 10 mg by mouth daily.    Historical Provider, MD   BP 125/64  Pulse 81  Temp(Src) 98.6 F (37 C) (Oral)  Resp 18  Ht 6' (1.829 m)  Wt 162 lb (73.483 kg)  BMI 21.97 kg/m2  SpO2 98%  Physical Exam  Nursing note and vitals reviewed. Constitutional: He is oriented to person, place, and time. He appears well-developed and well-nourished.  Non-toxic appearance. He does not appear ill. No distress.  HENT:  Head: Normocephalic and atraumatic.  Mouth/Throat: Oropharynx is clear and moist.  Eyes: Conjunctivae and EOM are normal. Pupils are equal, round, and reactive to light.  Neck: Normal range of motion. Neck supple.  Cardiovascular: Normal rate, regular rhythm and normal heart  sounds.   Pulmonary/Chest: Effort normal and breath sounds normal. No respiratory distress. He has no wheezes.  Abdominal: Soft. Bowel sounds are normal. He exhibits no distension. There is no tenderness. There is no guarding.  Abdomen soft, non-distended, no peritoneal signs  Musculoskeletal: Normal range of motion. He exhibits no edema.  Neurological: He is alert and oriented to person, place, and time.  Skin: Skin is warm and dry. He is not diaphoretic.  Psychiatric: He has a normal mood and affect.    ED Course  Procedures (including critical care time) Labs Review Labs Reviewed  CBC WITH DIFFERENTIAL - Abnormal; Notable for the following:    RBC 3.02 (*)    Hemoglobin 9.9 (*)    HCT 29.2 (*)    Neutrophils Relative % 82 (*)    Lymphocytes Relative  8 (*)    All other components within normal limits  COMPREHENSIVE METABOLIC PANEL - Abnormal; Notable for the following:    Sodium 133 (*)    Glucose, Bld 132 (*)    BUN 32 (*)    GFR calc non Af Amer 54 (*)    GFR calc Af Amer 63 (*)    All other components within normal limits  URINALYSIS, ROUTINE W REFLEX MICROSCOPIC - Abnormal; Notable for the following:    APPearance CLOUDY (*)    Hgb urine dipstick LARGE (*)    Protein, ur 100 (*)    Nitrite POSITIVE (*)    Leukocytes, UA LARGE (*)    All other components within normal limits  URINE MICROSCOPIC-ADD ON - Abnormal; Notable for the following:    Squamous Epithelial / LPF MANY (*)    Bacteria, UA MANY (*)    All other components within normal limits  URINE CULTURE  I-STAT CG4 LACTIC ACID, ED    Imaging Review No results found.   EKG Interpretation None      MDM   Final diagnoses:  UTI (lower urinary tract infection)   80 year old male presenting to the ED with fever, onset this morning.  advil given PTA, current temp 22F.  Pt is s/p ureteral stent placement for urinary obstruction with foley catheter placement x4 weeks, recently removed on Wednesday.  Some  difficulty urinating this am, did have to self cath.  On exam, patient is afebrile and overall nontoxic appearing. He is baseline oriented without any focal neurologic deficits. His abdominal exam is benign.  Will obtain u/a, basic labs, lactic acid.  Labs reassuring-- no leukocytosis and lactic acid WNL.  U/a nitrite +, culture pending.  Pt remains afebrile, non-toxic appearing, VS stable, and does not meet SIRS criteria.  He is baseline oriented without any mental status change, noted lethargy, or focal neurologic deficits.  Feel he can be safely managed as an OP.  Pt given 1g rocephin IV in the ED, will discharge home with keflex.  He will FU with his urologist, Dr. Diona Fanti.  Discussed plan with patient, he/she acknowledged understanding and agreed with plan of care.  Return precautions given for new or worsening symptoms.  Larene Pickett, PA-C 07/09/13 2313

## 2013-07-10 ENCOUNTER — Encounter (HOSPITAL_COMMUNITY): Payer: Self-pay | Admitting: Emergency Medicine

## 2013-07-10 ENCOUNTER — Emergency Department (HOSPITAL_COMMUNITY): Payer: Medicare Other

## 2013-07-10 ENCOUNTER — Inpatient Hospital Stay (HOSPITAL_COMMUNITY)
Admission: EM | Admit: 2013-07-10 | Discharge: 2013-07-13 | DRG: 690 | Disposition: A | Discharge status: Home Health | Payer: Medicare Other | Attending: Internal Medicine | Admitting: Internal Medicine

## 2013-07-10 DIAGNOSIS — Z9849 Cataract extraction status, unspecified eye: Secondary | ICD-10-CM

## 2013-07-10 DIAGNOSIS — R300 Dysuria: Secondary | ICD-10-CM

## 2013-07-10 DIAGNOSIS — R42 Dizziness and giddiness: Secondary | ICD-10-CM

## 2013-07-10 DIAGNOSIS — I340 Nonrheumatic mitral (valve) insufficiency: Secondary | ICD-10-CM

## 2013-07-10 DIAGNOSIS — I442 Atrioventricular block, complete: Secondary | ICD-10-CM

## 2013-07-10 DIAGNOSIS — I4719 Other supraventricular tachycardia: Secondary | ICD-10-CM

## 2013-07-10 DIAGNOSIS — I059 Rheumatic mitral valve disease, unspecified: Secondary | ICD-10-CM | POA: Diagnosis present

## 2013-07-10 DIAGNOSIS — A419 Sepsis, unspecified organism: Secondary | ICD-10-CM | POA: Diagnosis present

## 2013-07-10 DIAGNOSIS — Z950 Presence of cardiac pacemaker: Secondary | ICD-10-CM

## 2013-07-10 DIAGNOSIS — E039 Hypothyroidism, unspecified: Secondary | ICD-10-CM | POA: Diagnosis present

## 2013-07-10 DIAGNOSIS — Z96659 Presence of unspecified artificial knee joint: Secondary | ICD-10-CM

## 2013-07-10 DIAGNOSIS — N3289 Other specified disorders of bladder: Secondary | ICD-10-CM

## 2013-07-10 DIAGNOSIS — R6510 Systemic inflammatory response syndrome (SIRS) of non-infectious origin without acute organ dysfunction: Secondary | ICD-10-CM

## 2013-07-10 DIAGNOSIS — Z79899 Other long term (current) drug therapy: Secondary | ICD-10-CM

## 2013-07-10 DIAGNOSIS — B965 Pseudomonas (aeruginosa) (mallei) (pseudomallei) as the cause of diseases classified elsewhere: Secondary | ICD-10-CM | POA: Diagnosis present

## 2013-07-10 DIAGNOSIS — H9209 Otalgia, unspecified ear: Secondary | ICD-10-CM | POA: Diagnosis present

## 2013-07-10 DIAGNOSIS — C61 Malignant neoplasm of prostate: Secondary | ICD-10-CM | POA: Diagnosis present

## 2013-07-10 DIAGNOSIS — I482 Chronic atrial fibrillation, unspecified: Secondary | ICD-10-CM

## 2013-07-10 DIAGNOSIS — K5900 Constipation, unspecified: Secondary | ICD-10-CM

## 2013-07-10 DIAGNOSIS — D649 Anemia, unspecified: Secondary | ICD-10-CM | POA: Diagnosis present

## 2013-07-10 DIAGNOSIS — D696 Thrombocytopenia, unspecified: Secondary | ICD-10-CM | POA: Diagnosis present

## 2013-07-10 DIAGNOSIS — R531 Weakness: Secondary | ICD-10-CM

## 2013-07-10 DIAGNOSIS — I471 Supraventricular tachycardia: Secondary | ICD-10-CM

## 2013-07-10 DIAGNOSIS — I4891 Unspecified atrial fibrillation: Secondary | ICD-10-CM | POA: Diagnosis present

## 2013-07-10 DIAGNOSIS — E875 Hyperkalemia: Secondary | ICD-10-CM

## 2013-07-10 DIAGNOSIS — N390 Urinary tract infection, site not specified: Principal | ICD-10-CM | POA: Diagnosis present

## 2013-07-10 DIAGNOSIS — I495 Sick sinus syndrome: Secondary | ICD-10-CM

## 2013-07-10 LAB — URINALYSIS, ROUTINE W REFLEX MICROSCOPIC
Bilirubin Urine: NEGATIVE
GLUCOSE, UA: NEGATIVE mg/dL
Ketones, ur: 15 mg/dL — AB
Nitrite: POSITIVE — AB
PH: 5.5 (ref 5.0–8.0)
Protein, ur: 100 mg/dL — AB
SPECIFIC GRAVITY, URINE: 1.017 (ref 1.005–1.030)
Urobilinogen, UA: 0.2 mg/dL (ref 0.0–1.0)

## 2013-07-10 LAB — CBC WITH DIFFERENTIAL/PLATELET
BASOS PCT: 0 % (ref 0–1)
Basophils Absolute: 0 10*3/uL (ref 0.0–0.1)
Eosinophils Absolute: 0 10*3/uL (ref 0.0–0.7)
Eosinophils Relative: 0 % (ref 0–5)
HCT: 27.5 % — ABNORMAL LOW (ref 39.0–52.0)
HEMOGLOBIN: 9.5 g/dL — AB (ref 13.0–17.0)
Lymphocytes Relative: 6 % — ABNORMAL LOW (ref 12–46)
Lymphs Abs: 0.6 10*3/uL — ABNORMAL LOW (ref 0.7–4.0)
MCH: 32.9 pg (ref 26.0–34.0)
MCHC: 34.5 g/dL (ref 30.0–36.0)
MCV: 95.2 fL (ref 78.0–100.0)
Monocytes Absolute: 0.5 10*3/uL (ref 0.1–1.0)
Monocytes Relative: 6 % (ref 3–12)
NEUTROS PCT: 88 % — AB (ref 43–77)
Neutro Abs: 8.7 10*3/uL — ABNORMAL HIGH (ref 1.7–7.7)
Platelets: 124 10*3/uL — ABNORMAL LOW (ref 150–400)
RBC: 2.89 MIL/uL — ABNORMAL LOW (ref 4.22–5.81)
RDW: 12.6 % (ref 11.5–15.5)
WBC: 9.9 10*3/uL (ref 4.0–10.5)

## 2013-07-10 LAB — URINE MICROSCOPIC-ADD ON

## 2013-07-10 LAB — COMPREHENSIVE METABOLIC PANEL
ALBUMIN: 3.5 g/dL (ref 3.5–5.2)
ALT: 18 U/L (ref 0–53)
AST: 21 U/L (ref 0–37)
Alkaline Phosphatase: 70 U/L (ref 39–117)
BILIRUBIN TOTAL: 0.6 mg/dL (ref 0.3–1.2)
BUN: 28 mg/dL — ABNORMAL HIGH (ref 6–23)
CHLORIDE: 97 meq/L (ref 96–112)
CO2: 20 mEq/L (ref 19–32)
Calcium: 8.7 mg/dL (ref 8.4–10.5)
Creatinine, Ser: 1.24 mg/dL (ref 0.50–1.35)
GFR calc Af Amer: 62 mL/min — ABNORMAL LOW (ref >90)
GFR calc non Af Amer: 53 mL/min — ABNORMAL LOW (ref >90)
Glucose, Bld: 120 mg/dL — ABNORMAL HIGH (ref 70–99)
POTASSIUM: 4.2 meq/L (ref 3.7–5.3)
Sodium: 134 mEq/L — ABNORMAL LOW (ref 137–147)
Total Protein: 6.5 g/dL (ref 6.0–8.3)

## 2013-07-10 LAB — I-STAT CG4 LACTIC ACID, ED: LACTIC ACID, VENOUS: 0.55 mmol/L (ref 0.5–2.2)

## 2013-07-10 MED ORDER — PROPRANOLOL HCL ER 60 MG PO CP24
60.0000 mg | ORAL_CAPSULE | Freq: Every day | ORAL | Status: DC
  Administered 2013-07-11 – 2013-07-13 (×3): 60 mg via ORAL
  Filled 2013-07-10 (×3): qty 1

## 2013-07-10 MED ORDER — PIPERACILLIN-TAZOBACTAM 3.375 G IVPB
3.3750 g | Freq: Three times a day (TID) | INTRAVENOUS | Status: DC
  Administered 2013-07-10 – 2013-07-12 (×6): 3.375 g via INTRAVENOUS
  Filled 2013-07-10 (×7): qty 50

## 2013-07-10 MED ORDER — TAMSULOSIN HCL 0.4 MG PO CAPS
0.4000 mg | ORAL_CAPSULE | Freq: Every day | ORAL | Status: DC
  Administered 2013-07-10 – 2013-07-13 (×4): 0.4 mg via ORAL
  Filled 2013-07-10 (×4): qty 1

## 2013-07-10 MED ORDER — HEPARIN SODIUM (PORCINE) 5000 UNIT/ML IJ SOLN
5000.0000 [IU] | Freq: Three times a day (TID) | INTRAMUSCULAR | Status: DC
  Administered 2013-07-10 – 2013-07-11 (×3): 5000 [IU] via SUBCUTANEOUS
  Filled 2013-07-10 (×6): qty 1

## 2013-07-10 MED ORDER — CALCIUM CARBONATE-VITAMIN D 500-200 MG-UNIT PO TABS
1.0000 | ORAL_TABLET | Freq: Two times a day (BID) | ORAL | Status: DC
  Administered 2013-07-10 – 2013-07-13 (×7): 1 via ORAL
  Filled 2013-07-10 (×8): qty 1

## 2013-07-10 MED ORDER — ADULT MULTIVITAMIN W/MINERALS CH
1.0000 | ORAL_TABLET | Freq: Every day | ORAL | Status: DC
  Administered 2013-07-10 – 2013-07-13 (×4): 1 via ORAL
  Filled 2013-07-10 (×4): qty 1

## 2013-07-10 MED ORDER — SODIUM CHLORIDE 0.9 % IV SOLN
1250.0000 mg | Freq: Once | INTRAVENOUS | Status: AC
  Administered 2013-07-10: 1250 mg via INTRAVENOUS
  Filled 2013-07-10: qty 1250

## 2013-07-10 MED ORDER — CEREFOLIN NAC 6-2-600 MG PO TABS: 1.0000 | ORAL_TABLET | Freq: Every day | ORAL | Status: DC

## 2013-07-10 MED ORDER — SODIUM CHLORIDE 0.9 % IV SOLN
INTRAVENOUS | Status: DC
  Administered 2013-07-10 – 2013-07-12 (×5): via INTRAVENOUS

## 2013-07-10 MED ORDER — SODIUM CHLORIDE 0.9 % IV BOLUS (SEPSIS): 250.0000 mL | INTRAVENOUS | Status: DC | PRN

## 2013-07-10 MED ORDER — POLYVINYL ALCOHOL 1.4 % OP SOLN
1.0000 [drp] | Freq: Every day | OPHTHALMIC | Status: DC | PRN
  Filled 2013-07-10 (×2): qty 15

## 2013-07-10 MED ORDER — ONDANSETRON HCL 4 MG/2ML IJ SOLN: 4.0000 mg | Freq: Three times a day (TID) | INTRAMUSCULAR | Status: DC | PRN

## 2013-07-10 MED ORDER — SODIUM CHLORIDE 0.9 % IV SOLN: INTRAVENOUS | Status: DC

## 2013-07-10 MED ORDER — ACETAMINOPHEN 325 MG PO TABS
650.0000 mg | ORAL_TABLET | Freq: Four times a day (QID) | ORAL | Status: DC | PRN
  Administered 2013-07-10: 650 mg via ORAL
  Filled 2013-07-10: qty 2

## 2013-07-10 MED ORDER — HYDROCODONE-ACETAMINOPHEN 5-325 MG PO TABS
1.0000 | ORAL_TABLET | Freq: Four times a day (QID) | ORAL | Status: DC | PRN
  Administered 2013-07-10 – 2013-07-12 (×4): 2 via ORAL
  Filled 2013-07-10 (×4): qty 2

## 2013-07-10 MED ORDER — VANCOMYCIN HCL IN DEXTROSE 750-5 MG/150ML-% IV SOLN
750.0000 mg | Freq: Two times a day (BID) | INTRAVENOUS | Status: DC
  Administered 2013-07-10 – 2013-07-11 (×3): 750 mg via INTRAVENOUS
  Filled 2013-07-10 (×4): qty 150

## 2013-07-10 MED ORDER — ASPIRIN EC 81 MG PO TBEC
81.0000 mg | DELAYED_RELEASE_TABLET | Freq: Every day | ORAL | Status: DC
  Administered 2013-07-10 – 2013-07-11 (×2): 81 mg via ORAL
  Filled 2013-07-10 (×3): qty 1

## 2013-07-10 MED ORDER — ONDANSETRON HCL 4 MG PO TABS: 4.0000 mg | ORAL_TABLET | Freq: Four times a day (QID) | ORAL | Status: DC | PRN

## 2013-07-10 MED ORDER — CARBOXYMETHYLCELLUL-GLYCERIN 0.5-0.9 % OP SOLN: 1.0000 [drp] | Freq: Every day | OPHTHALMIC | Status: DC | PRN

## 2013-07-10 MED ORDER — ONDANSETRON HCL 4 MG/2ML IJ SOLN
4.0000 mg | Freq: Four times a day (QID) | INTRAMUSCULAR | Status: DC | PRN
  Administered 2013-07-10: 4 mg via INTRAVENOUS
  Filled 2013-07-10: qty 2

## 2013-07-10 MED ORDER — ACETAMINOPHEN 650 MG RE SUPP: 650.0000 mg | Freq: Four times a day (QID) | RECTAL | Status: DC | PRN

## 2013-07-10 MED ORDER — CALCIUM CITRATE-VITAMIN D 315-200 MG-UNIT PO TABS: 1.0000 | ORAL_TABLET | Freq: Two times a day (BID) | ORAL | Status: DC

## 2013-07-10 MED ORDER — PROPRANOLOL HCL ER 60 MG PO CP24
60.0000 mg | ORAL_CAPSULE | Freq: Every day | ORAL | Status: DC
  Filled 2013-07-10: qty 1

## 2013-07-10 MED ORDER — SODIUM CHLORIDE 0.9 % IV BOLUS (SEPSIS)
1000.0000 mL | Freq: Once | INTRAVENOUS | Status: AC
  Administered 2013-07-10: 1000 mL via INTRAVENOUS

## 2013-07-10 MED ORDER — GLUCOSAMINE 1500 COMPLEX PO CAPS: 1.0000 | ORAL_CAPSULE | Freq: Two times a day (BID) | ORAL | Status: DC

## 2013-07-10 MED ORDER — LEVOTHYROXINE SODIUM 137 MCG PO TABS
137.0000 ug | ORAL_TABLET | Freq: Every day | ORAL | Status: DC
  Administered 2013-07-10 – 2013-07-13 (×4): 137 ug via ORAL
  Filled 2013-07-10 (×5): qty 1

## 2013-07-10 MED ORDER — MELATONIN 3 MG PO TABS: 3.0000 mg | ORAL_TABLET | Freq: Every day | ORAL | Status: DC

## 2013-07-10 MED ORDER — PIPERACILLIN-TAZOBACTAM 3.375 G IVPB 30 MIN
3.3750 g | Freq: Once | INTRAVENOUS | Status: AC
  Administered 2013-07-10: 3.375 g via INTRAVENOUS
  Filled 2013-07-10: qty 50

## 2013-07-10 MED ORDER — ACETAMINOPHEN 325 MG PO TABS
650.0000 mg | ORAL_TABLET | Freq: Four times a day (QID) | ORAL | Status: DC | PRN
  Administered 2013-07-10 – 2013-07-11 (×2): 650 mg via ORAL
  Filled 2013-07-10 (×2): qty 2

## 2013-07-10 NOTE — ED Notes (Signed)
Attempted report 

## 2013-07-10 NOTE — Progress Notes (Addendum)
Pt getting washed up at the sink. Patient's HR consistently alarming as Vtach and ST 140's on telemetry. Verified with CN that patient was not in [place]. Vitals stable, pt's HR actually 100-110's. Pt is asymptomatic. Dr. Daleen Bo notified. No new orders. Will monitor.  Addendum: Pt later c/o tremors in his arms/legs that was causing a lot of pain. Dr. Daleen Bo noted who stated it was a normal symptom of sepsis. Patient given pain and nausea medicine at this time. Will monitor.  Joellen Jersey, RN.

## 2013-07-10 NOTE — Progress Notes (Signed)
Admission note:   Arrival Method: Via stretcher from the ED at approx 0900. Mental Status: A&OX4. Telemetry: Placed on box #1.  Skin: Intact. Buttocks/sacrum reddened, but blanchable.  Tubes: N/A IV: RLFA NS@125ml /hr. Pain: Denies.  Family: Daughter Webb Silversmith at bedside. Living Situation: Home with spouse. Safety Measures: Safety video viewed, fall prevention safety plan reviewed, bed alarm in place.  6E Orientation: Oriented to unit and surroundings. Call bell within reach.  Joellen Jersey, RN.

## 2013-07-10 NOTE — H&P (Signed)
[facility] History and Physical  [name] WGY:[id number] DOB: 11-24-1931 DOA: 07/10/2013  Referring physician:  PCP: [name], MD  Specialists:   Chief Complaint: fever, weakness   HPI: [name] is a 80 y.o. male with PMH of A fib, s/p PPM, prostate CA with recent BL hydronephrosis, AKI, bladder mass s/p cystoscopy,  BL ureteral stents, transurethral resection of bladder tumor on 5/15 presented with fever, chills, generalized weakness, dysuria and found to have UTI, hypotension; . Pt denies SOB, no chest pain, no nausea, vomiting or diarrhea, no focal neuro weakness;    Review of Systems: The patient denies anorexia, fever, weight loss,, vision loss, decreased hearing, hoarseness, chest pain, syncope, dyspnea on exertion, peripheral edema, balance deficits, hemoptysis, abdominal pain, melena, hematochezia, severe indigestion/heartburn, hematuria, incontinence, genital sores, muscle weakness, suspicious skin lesions, transient blindness, difficulty walking, depression, unusual weight change, abnormal bleeding, enlarged lymph nodes, angioedema, and breast masses.   Past Medical History  Diagnosis Date  . Bradycardia     s/p PPM  . Atrial flutter     s/p ablation  . Atrial fibrillation   . Prostate cancer 12-21-2003    12-7-seed implants done  . Hematoma   . Mitral regurgitation     mild echo 2012  . Pacemaker --[employer]   . Orthostatic lightheadedness   . Subdural hematoma   . Anemia due to GI blood loss   . Prostate cancer    Past Surgical History  Procedure Laterality Date  . Tonsillectomy  as child  . Arthrscopic left knee surgery  02-11-2007  . Cardioversion  09-04-2006  . Av node ablation  04-06-2007  . Subdural hematoma evacuation via craniotomy  12-16-2007  . Rotator cuff repair w/ distal clavicle excision  08-31-2010    left  . Pacemaker insertion      [employer] Insignia   . Total knee arthroplasty  12/18/2010    Procedure: TOTAL KNEE  ARTHROPLASTY;  Surgeon: Mauri Pole;  Location: [facility];  Service: Orthopedics;  Laterality: Left;  . Joint replacement    . Brain surgery    . Cardioversion    . Cataract extraction    . Left knee arthroscopy    . Cystoscopy w/ ureteral stent placement Bilateral 05/30/2013    Procedure: CYSTOSCOPY WITH RETROGRADE PYELOGRAM/ BILATERAL URETERAL STENT PLACEMENT, TRANSURETRAL RESECTION OF BLADDER TUMOR WITH GYRUS;  Surgeon: Alexis Frock, MD;  Location: [facility];  Service: Urology;  Laterality: Bilateral;   Social History:  reports that he has never smoked. He has never used smokeless tobacco. He reports that he drinks about 7.7 ounces of alcohol per week. He reports that he does not use illicit drugs. Home;  where does patient live--home, ALF, SNF? and with whom if at home? No;  Can patient participate in ADLs?  No Known Allergies  Family History  Problem Relation Age of Onset  . Aneurysm Father   . Stroke Father     (be sure to complete)  Prior to Admission medications   Medication Sig Start Date End Date Taking? Authorizing Provider  calcium citrate-vitamin D (CITRACAL+D) 315-200 MG-UNIT per tablet Take 1 tablet by mouth 2 (two) times daily.   Yes Historical Provider, MD  Carboxymethylcellul-Glycerin (LUBRICATING EYE DROPS) 0.5-0.9 % SOLN Apply 1 drop to eye daily as needed (dry eyes).    Yes Historical Provider, MD  docusate sodium (COLACE) 100 MG capsule Take 1 capsule (100 mg total) by mouth every 12 (twelve) hours. 06/13/13  Yes Barbette Hair  Horton, MD  Glucosamine-Chondroit-Vit C-Mn (GLUCOSAMINE 1500 COMPLEX) CAPS Take 1 capsule by mouth 2 (two) times daily.   Yes Historical Provider, MD  HYDROcodone-acetaminophen (NORCO/VICODIN) 5-325 MG per tablet Take 1-2 tablets by mouth every 6 (six) hours as needed for moderate pain or severe pain. 06/13/13  Yes Merryl Hacker, MD  ibuprofen (ADVIL,MOTRIN) 200 MG tablet Take 200 mg by mouth every 6 (six) hours as needed for mild pain.    Yes  Historical Provider, MD  levothyroxine (SYNTHROID, LEVOTHROID) 137 MCG tablet Take 137 mcg by mouth daily before breakfast.   Yes Historical Provider, MD  Melatonin 3 MG TABS Take 3 mg by mouth at bedtime.    Yes Historical Provider, MD  Methylfol-Methylcob-Acetylcyst (CEREFOLIN NAC) 6-2-600 MG TABS Take 1 tablet by mouth daily.   Yes Historical Provider, MD  Multiple Vitamin (MULTIVITAMIN WITH MINERALS) TABS tablet Take 1 tablet by mouth daily.   Yes Historical Provider, MD  Omega-3 Fatty Acids (FISH OIL) 500 MG CAPS Take 500 mg by mouth 2 (two) times daily.   Yes Historical Provider, MD  propranolol ER (INDERAL LA) 60 MG 24 hr capsule Take 60 mg by mouth daily.   Yes Historical Provider, MD  solifenacin (VESICARE) 10 MG tablet Take 10 mg by mouth daily.   Yes Historical Provider, MD  tamsulosin (FLOMAX) 0.4 MG CAPS capsule Take 0.4 mg by mouth daily.   Yes Historical Provider, MD  cephALEXin (KEFLEX) 500 MG capsule Take 1 capsule (500 mg total) by mouth 3 (three) times daily. 07/09/13   Larene Pickett, PA-C   Physical Exam: Filed Vitals:   07/10/13 0742  BP: 100/56  Pulse:   Temp:   Resp: 15     General:  alert  Eyes: eom-i  ENT: no oral ulcers   Neck: supple   Cardiovascular: s1,s2 rrr  Respiratory: CTA BL  Abdomen: soft, nt,nd   Skin: no rash   Musculoskeletal: no LE edema  Psychiatric: no hallucinations   Neurologic: CN 2-12 intact, motor 5/5 BL:  Labs on Admission:  Basic Metabolic Panel:  Recent Labs Lab 07/09/13 1840 07/10/13 0610  NA 133* 134*  K 4.1 4.2  CL 98 97  CO2 20 20  GLUCOSE 132* 120*  BUN 32* 28*  CREATININE 1.22 1.24  CALCIUM 8.8 8.7   Liver Function Tests:  Recent Labs Lab 07/09/13 1840 07/10/13 0610  AST 22 21  ALT 18 18  ALKPHOS 83 70  BILITOT 0.4 0.6  PROT 6.9 6.5  ALBUMIN 3.6 3.5   No results found for this basename: LIPASE, AMYLASE,  in the last 168 hours No results found for this basename: AMMONIA,  in the last 168  hours CBC:  Recent Labs Lab 07/09/13 1840 07/10/13 0610  WBC 8.2 9.9  NEUTROABS 6.8 8.7*  HGB 9.9* 9.5*  HCT 29.2* 27.5*  MCV 96.7 95.2  PLT 152 124*   Cardiac Enzymes: No results found for this basename: CKTOTAL, CKMB, CKMBINDEX, TROPONINI,  in the last 168 hours  BNP (last 3 results) No results found for this basename: PROBNP,  in the last 8760 hours CBG: No results found for this basename: GLUCAP,  in the last 168 hours  Radiological Exams on Admission: Dg Chest Portable 1 View  07/10/2013   CLINICAL DATA:  Sepsis  EXAM: PORTABLE CHEST - 1 VIEW  COMPARISON:  06/13/2013  FINDINGS: Stable orientation of a dual-chamber left approach pacer. No cardiomegaly. Unchanged mild aortic tortuosity.  No consolidation, edema, effusion, or pneumothorax.  There is minimal scarring or atelectasis at the peripheral left base. Chronic fragmentation of the lateral left clavicle.  IMPRESSION: No active disease.   Electronically Signed   By: Jorje Guild M.D.   On: 07/10/2013 07:03   Dg Abd Portable 1v  07/10/2013   CLINICAL DATA:  Urinary tract infection  EXAM: PORTABLE ABDOMEN - 1 VIEW  COMPARISON:  06/13/2013  FINDINGS: Similar orientation of bilateral internal ureteral stents. The right lower limb is not formed, but is stable from previous. There is no visible stone along the course of the ureters or overlapping the renal shadows. The low pelvis is not imaged.  No abnormal intra-abdominal mass effect or calcification. Clear lung bases.  IMPRESSION: Unchanged positioning of bilateral internal ureteral stents.   Electronically Signed   By: Jorje Guild M.D.   On: 07/10/2013 07:06    EKG: Independently reviewed. Not done   Assessment/Plan Principal Problem:   UTI (lower urinary tract infection) Active Problems:   Sepsis   ATRIAL FIBRILLATION-paroxysmal   Prostate cancer  80 y.o. male with PMH of A fib, s/p PPM, prostate CA with recent BL hydronephrosis, AKI, bladder mass s/p cystoscopy,   BL ureteral stents, transurethral resection of bladder tumor on 5/15 presented with fever, chills, generalized weakness, dysuria and found to have UTI, hypotension  1. UTI, sirs, sepsis; BP improved on IVF, atx  -started IV atx, IVF, f/u cultures; monitor hemodynamics   2. Prostate CA,  recent BL hydronephrosis, AKI, bladder mass s/p cystoscopy,  BL ureteral stents, transurethral resection of bladder tumor on 5/15 -bone scan (5/21): No evidence of skeletal metastasis -stable renal function; not anuric; cont monitoring, informed Dr. Tresa Moore  3. PAF s/p PPM"Pt was on xarelto which was on hold since the last admission due to surgery" -still persistent hematuria, start ASA, d/w patient will resume xarelto if hematuria resolves, with no s/s of bleeding;   4. Anemia, chronic; no s/s of bleeding;  -check iron profile; start PO iron;   Urology informed;  if consultant consulted, please document name and whether formally or informally consulted  Code Status: full (must indicate code status--if unknown or must be presumed, indicate so) Family Communication:  D/w patient, his daughter  (indicate person spoken with, if applicable, with phone number if by telephone) Disposition Plan: home pend clinical improvement  (indicate anticipated LOS)  Time spent: >35 minutes   Kinnie Feil [facility] Pager [phone]  If 7PM-7AM, please contact night-coverage [email or web address] Password [facility] 07/10/2013, 8:04 AM

## 2013-07-10 NOTE — ED Notes (Addendum)
PER EMS: pt from home, reports he was seen here earlier today and discharged about 12 hours ago and diagnosed with UTI. Pt reports that since he was d/c he has been feeling chills and states he has been running a fever and has not been able to control his urination. BP-118/72, HR-74, A&OX4. Temp 98.1

## 2013-07-10 NOTE — ED Provider Notes (Signed)
CSN: [id number]     Arrival date & time 07/10/13  0451 History   First MD Initiated Contact with Patient 07/10/13 [phone]     Chief Complaint  Patient presents with  . Urinary Tract Infection     (Consider location/radiation/quality/duration/timing/severity/associated sxs/prior Treatment) HPI Comments: 80 year old male with history of sick sinus, pacemaker, atrial tachycardia, acute renal failure, bladder mass, prostate cancer presents with worsening weakness and fevers this evening and early this morning. Patient was seen earlier in the ER and diagnosed with urine infection given Rocephin with outpatient followup however patient's symptoms have worsened since she's been home. Patient was recently hospitalized for urinary retention and stents placements was discharged on the 22nd. Patient recently had stent removed is doing okay until the past 24 hours.  Patient is a 80 y.o. male presenting with urinary tract infection. The history is provided by the patient.  Urinary Tract Infection Pertinent negatives include no chest pain, no abdominal pain, no headaches and no shortness of breath.    Past Medical History  Diagnosis Date  . Bradycardia     s/p PPM  . Atrial flutter     s/p ablation  . Atrial fibrillation   . Prostate cancer 12-21-2003    12-7-seed implants done  . Hematoma   . Mitral regurgitation     mild echo 2012  . Pacemaker --[employer]   . Orthostatic lightheadedness   . Subdural hematoma   . Anemia due to GI blood loss   . Prostate cancer    Past Surgical History  Procedure Laterality Date  . Tonsillectomy  as child  . Arthrscopic left knee surgery  02-11-2007  . Cardioversion  09-04-2006  . Av node ablation  04-06-2007  . Subdural hematoma evacuation via craniotomy  12-16-2007  . Rotator cuff repair w/ distal clavicle excision  08-31-2010    left  . Pacemaker insertion      [employer] Insignia   . Total knee arthroplasty  12/18/2010    Procedure: TOTAL KNEE  ARTHROPLASTY;  Surgeon: Mauri Pole;  Location: [facility];  Service: Orthopedics;  Laterality: Left;  . Joint replacement    . Brain surgery    . Cardioversion    . Cataract extraction    . Left knee arthroscopy    . Cystoscopy w/ ureteral stent placement Bilateral 05/30/2013    Procedure: CYSTOSCOPY WITH RETROGRADE PYELOGRAM/ BILATERAL URETERAL STENT PLACEMENT, TRANSURETRAL RESECTION OF BLADDER TUMOR WITH GYRUS;  Surgeon: Alexis Frock, MD;  Location: [facility];  Service: Urology;  Laterality: Bilateral;   Family History  Problem Relation Age of Onset  . Aneurysm Father   . Stroke Father    History  Substance Use Topics  . Smoking status: Never Smoker   . Smokeless tobacco: Never Used  . Alcohol Use: 7.7 oz/week    7 Glasses of wine, 7 Drinks containing 0.5 oz of alcohol per week     Comment: 2 glasses wine day    Review of Systems  Constitutional: Positive for fever, chills, appetite change and fatigue.  HENT: Negative for congestion.   Eyes: Negative for visual disturbance.  Respiratory: Negative for shortness of breath.   Cardiovascular: Negative for chest pain.  Gastrointestinal: Negative for vomiting and abdominal pain.  Genitourinary: Positive for dysuria. Negative for flank pain.  Musculoskeletal: Negative for back pain, neck pain and neck stiffness.  Skin: Negative for rash.  Neurological: Positive for weakness and light-headedness. Negative for headaches.      Allergies  Review of patient's  allergies indicates no known allergies.  Home Medications   Prior to Admission medications   Medication Sig Start Date End Date Taking? Authorizing Provider  calcium citrate-vitamin D (CITRACAL+D) 315-200 MG-UNIT per tablet Take 1 tablet by mouth 2 (two) times daily.    Historical Provider, MD  Carboxymethylcellul-Glycerin (LUBRICATING EYE DROPS) 0.5-0.9 % SOLN Apply 1 drop to eye daily as needed (dry eyes).     Historical Provider, MD  cephALEXin (KEFLEX) 500 MG capsule Take  1 capsule (500 mg total) by mouth 3 (three) times daily. 07/09/13   Larene Pickett, PA-C  docusate sodium (COLACE) 100 MG capsule Take 1 capsule (100 mg total) by mouth every 12 (twelve) hours. 06/13/13   Merryl Hacker, MD  Glucosamine-Chondroit-Vit C-Mn (GLUCOSAMINE 1500 COMPLEX) CAPS Take 1 capsule by mouth 2 (two) times daily.    Historical Provider, MD  HYDROcodone-acetaminophen (NORCO/VICODIN) 5-325 MG per tablet Take 1-2 tablets by mouth every 6 (six) hours as needed for moderate pain or severe pain. 06/13/13   Merryl Hacker, MD  ibuprofen (ADVIL,MOTRIN) 200 MG tablet Take 200 mg by mouth every 6 (six) hours as needed.    Historical Provider, MD  levothyroxine (SYNTHROID, LEVOTHROID) 137 MCG tablet Take 137 mcg by mouth daily before breakfast.    Historical Provider, MD  Melatonin 3 MG TABS Take 3 mg by mouth at bedtime.     Historical Provider, MD  Methylfol-Methylcob-Acetylcyst (CEREFOLIN NAC) 6-2-600 MG TABS Take 1 tablet by mouth daily.    Historical Provider, MD  Multiple Vitamin (MULTIVITAMIN WITH MINERALS) TABS tablet Take 1 tablet by mouth daily.    Historical Provider, MD  Omega-3 Fatty Acids (FISH OIL) 500 MG CAPS Take 500 mg by mouth 2 (two) times daily.    Historical Provider, MD  propranolol ER (INDERAL LA) 60 MG 24 hr capsule Take 60 mg by mouth daily.    Historical Provider, MD  solifenacin (VESICARE) 10 MG tablet Take 10 mg by mouth daily.    Historical Provider, MD  tamsulosin (FLOMAX) 0.4 MG CAPS capsule Take 0.4 mg by mouth daily.    Historical Provider, MD   BP 106/65  Pulse 82  Temp(Src) 101.9 F (38.8 C) (Oral)  Resp 16  Ht 6' (1.829 m)  Wt 170 lb (77.111 kg)  BMI 23.05 kg/m2  SpO2 99% Physical Exam  Nursing note and vitals reviewed. Constitutional: He is oriented to person, place, and time. He appears well-developed and well-nourished.  HENT:  Head: Normocephalic and atraumatic.  Dry mucous membranes  Eyes: Conjunctivae are normal. Right eye exhibits no  discharge. Left eye exhibits no discharge.  Neck: Normal range of motion. Neck supple. No tracheal deviation present.  Cardiovascular: Normal rate and regular rhythm.   Pulmonary/Chest: Effort normal and breath sounds normal.  Abdominal: Soft. He exhibits no distension. There is no tenderness. There is no guarding.  Musculoskeletal: He exhibits no edema.  Neurological: He is alert and oriented to person, place, and time. GCS eye subscore is 4. GCS verbal subscore is 5. GCS motor subscore is 6.  No meningismus, neck supple Mild general weakness  Skin: Skin is warm. No rash noted.  Psychiatric: He has a normal mood and affect.    ED Course  Procedures (including critical care time) Labs Review Labs Reviewed  URINALYSIS, ROUTINE W REFLEX MICROSCOPIC - Abnormal; Notable for the following:    APPearance CLOUDY (*)    Hgb urine dipstick LARGE (*)    Ketones, ur 15 (*)  Protein, ur 100 (*)    Nitrite POSITIVE (*)    Leukocytes, UA LARGE (*)    All other components within normal limits  CBC WITH DIFFERENTIAL - Abnormal; Notable for the following:    RBC 2.89 (*)    Hemoglobin 9.5 (*)    HCT 27.5 (*)    Platelets 124 (*)    Neutrophils Relative % 88 (*)    Neutro Abs 8.7 (*)    Lymphocytes Relative 6 (*)    Lymphs Abs 0.6 (*)    All other components within normal limits  COMPREHENSIVE METABOLIC PANEL - Abnormal; Notable for the following:    Sodium 134 (*)    Glucose, Bld 120 (*)    BUN 28 (*)    GFR calc non Af Amer 53 (*)    GFR calc Af Amer 62 (*)    All other components within normal limits  URINE MICROSCOPIC-ADD ON - Abnormal; Notable for the following:    Bacteria, UA FEW (*)    All other components within normal limits  CULTURE, BLOOD (ROUTINE X 2)  CULTURE, BLOOD (ROUTINE X 2)  I-STAT CG4 LACTIC ACID, ED    Imaging Review Dg Chest Portable 1 View  07/10/2013   CLINICAL DATA:  Sepsis  EXAM: PORTABLE CHEST - 1 VIEW  COMPARISON:  06/13/2013  FINDINGS: Stable  orientation of a dual-chamber left approach pacer. No cardiomegaly. Unchanged mild aortic tortuosity.  No consolidation, edema, effusion, or pneumothorax. There is minimal scarring or atelectasis at the peripheral left base. Chronic fragmentation of the lateral left clavicle.  IMPRESSION: No active disease.   Electronically Signed   By: Jorje Guild M.D.   On: 07/10/2013 07:03   Dg Abd Portable 1v  07/10/2013   CLINICAL DATA:  Urinary tract infection  EXAM: PORTABLE ABDOMEN - 1 VIEW  COMPARISON:  06/13/2013  FINDINGS: Similar orientation of bilateral internal ureteral stents. The right lower limb is not formed, but is stable from previous. There is no visible stone along the course of the ureters or overlapping the renal shadows. The low pelvis is not imaged.  No abnormal intra-abdominal mass effect or calcification. Clear lung bases.  IMPRESSION: Unchanged positioning of bilateral internal ureteral stents.   Electronically Signed   By: Jorje Guild M.D.   On: 07/10/2013 07:06     EKG Interpretation None      MDM   Final diagnoses:  UTI (lower urinary tract infection)  SIRS (systemic inflammatory response syndrome)  Dysuria  General weakness   Patient presented clinically sepsis with known urine infection, with worsening symptoms despite Rocephin earlier today broad-spectrum antibiotics given considering recent hospitalization and stent removal. Chest x-ray and KUB ordered. 2 L fluid bolus ordered and plan for admission.  The patients results and plan were reviewed and discussed.   Any x-rays performed were personally reviewed by myself.   Differential diagnosis were considered with the presenting HPI.  Medications  acetaminophen (TYLENOL) tablet 650 mg (650 mg Oral Given 07/10/13 0622)  sodium chloride 0.9 % bolus 1,000 mL (not administered)  piperacillin-tazobactam (ZOSYN) IVPB 3.375 g (not administered)  vancomycin (VANCOCIN) 1,250 mg in sodium chloride 0.9 % 250 mL IVPB (not  administered)  sodium chloride 0.9 % bolus 1,000 mL (1,000 mLs Intravenous New Bag/Given 07/10/13 0623)     Filed Vitals:   07/10/13 0539 07/10/13 0600 07/10/13 0630 07/10/13 0651  BP:  106/56 95/63 106/65  Pulse:  99 82   Temp: 101.9 F (38.8 C)  TempSrc: Oral     Resp:  23 22 16   Height:      Weight:      SpO2:  95% 98% 99%    Admission/ observation were discussed with the admitting physician Dr Daleen Bo patient and/or family and they are comfortable with the plan.     Mariea Clonts, MD 07/10/13 [phone]

## 2013-07-10 NOTE — Progress Notes (Addendum)
ANTIBIOTIC CONSULT NOTE - INITIAL  Pharmacy Consult for Zosyn and Vancomycin Indication: rule out sepsis/UTI  No Known Allergies  Patient Measurements: Height: 6' (182.9 cm) Weight: 169 lb 6.4 oz (76.839 kg) IBW/kg (Calculated) : 77.6  Vital Signs: Temp: 97.8 F (36.6 C) (06/27 0915) Temp src: Oral (06/27 0915) BP: 94/60 mmHg (06/27 0915) Pulse Rate: 78 (06/27 0915) Intake/Output from previous day:   Intake/Output from this shift:    Labs:  Recent Labs  07/09/13 1840 07/10/13 0610  WBC 8.2 9.9  HGB 9.9* 9.5*  PLT 152 124*  CREATININE 1.22 1.24   Estimated Creatinine Clearance: 51.6 ml/min (by C-G formula based on Cr of 1.24). No results found for this basename: Letta Median, VANCORANDOM, GENTTROUGH, GENTPEAK, GENTRANDOM, TOBRATROUGH, TOBRAPEAK, TOBRARND, AMIKACINPEAK, AMIKACINTROU, AMIKACIN,  in the last 72 hours   Microbiology: Recent Results (from the past 720 hour(s))  URINE CULTURE     Status: None   Collection Time    06/13/13 10:28 AM      Result Value Ref Range Status   Specimen Description URINE, CATHETERIZED   Final   Special Requests NONE   Final   Culture  Setup Time     Final   Value: 06/13/2013 18:41     Performed at [place]     Final   Value: NO GROWTH     Performed at [facility]   Culture     Final   Value: NO GROWTH     Performed at [facility]   Report Status 06/14/2013 FINAL   Final  URINE CULTURE     Status: None   Collection Time    06/15/13  2:35 AM      Result Value Ref Range Status   Specimen Description URINE, CATHETERIZED   Final   Special Requests NONE   Final   Culture  Setup Time     Final   Value: 06/15/2013 03:21     Performed at [place]     Final   Value: NO GROWTH     Performed at [facility]   Culture     Final   Value: NO GROWTH     Performed at [facility]   Report Status 06/16/2013 FINAL   Final    Medical  History: Past Medical History  Diagnosis Date  . Bradycardia     s/p PPM  . Atrial flutter     s/p ablation  . Atrial fibrillation   . Prostate cancer 12-21-2003    12-7-seed implants done  . Hematoma   . Mitral regurgitation     mild echo 2012  . Pacemaker --[employer]   . Orthostatic lightheadedness   . Subdural hematoma   . Anemia due to GI blood loss   . Prostate cancer     Medications:  Scheduled:  . aspirin EC  81 mg Oral Daily  . calcium-vitamin D  1 tablet Oral BID  . heparin  5,000 Units Subcutaneous 3 times per day  . levothyroxine  137 mcg Oral QAC breakfast  . multivitamin with minerals  1 tablet Oral Daily  . piperacillin-tazobactam (ZOSYN)  IV  3.375 g Intravenous 3 times per day  . [START ON 07/11/2013] propranolol ER  60 mg Oral Daily  . tamsulosin  0.4 mg Oral Daily   Infusions:  . sodium chloride     Assessment: 80 yo M originally presenting in ED  on 6/26 with fever up to 101.8, chills and weakness. Pt was given CTX x 1, then d/c home with Keflex but returned on 6/27 with continued fevers, chills and inability to control urination. Of note, patient recently had surgery for resection of bladder tumor on 5/15. Will empirically start Zosyn + Vanc for r/o sepsis/UTI. Tmax 103, WBC wnl, SCr 1.24 (CrCl ~51 ml/min).   6/27 Vanc>> 6/27 Zosyn>>  6/27 BCx2>> 6/27 UCx>>  Goal of Therapy:  Resolution of infection  Plan:  - Zosyn 3.375 gm IV q8h (4 hr infusion) - Vancomycin 750 mg IV q12h  - Monitor temp, WBC, renal function, VT as indicated, C&S, clinical improvement  Harolyn Rutherford, PharmD Clinical Pharmacist - Resident Pager: [phone] Pharmacy: [phone] 07/10/2013 9:36 AM

## 2013-07-11 LAB — CBC
HEMATOCRIT: 25.1 % — AB (ref 39.0–52.0)
Hemoglobin: 8.7 g/dL — ABNORMAL LOW (ref 13.0–17.0)
MCH: 33.7 pg (ref 26.0–34.0)
MCHC: 34.7 g/dL (ref 30.0–36.0)
MCV: 97.3 fL (ref 78.0–100.0)
Platelets: 100 10*3/uL — ABNORMAL LOW (ref 150–400)
RBC: 2.58 MIL/uL — AB (ref 4.22–5.81)
RDW: 12.8 % (ref 11.5–15.5)
WBC: 7.8 10*3/uL (ref 4.0–10.5)

## 2013-07-11 LAB — BASIC METABOLIC PANEL
BUN: 23 mg/dL (ref 6–23)
CO2: 19 mEq/L (ref 19–32)
Calcium: 7.8 mg/dL — ABNORMAL LOW (ref 8.4–10.5)
Chloride: 100 mEq/L (ref 96–112)
Creatinine, Ser: 1.36 mg/dL — ABNORMAL HIGH (ref 0.50–1.35)
GFR calc non Af Amer: 48 mL/min — ABNORMAL LOW (ref >90)
GFR, EST AFRICAN AMERICAN: 55 mL/min — AB (ref >90)
Glucose, Bld: 108 mg/dL — ABNORMAL HIGH (ref 70–99)
POTASSIUM: 3.7 meq/L (ref 3.7–5.3)
SODIUM: 134 meq/L — AB (ref 137–147)

## 2013-07-11 MED ORDER — CARBAMIDE PEROXIDE 6.5 % OT SOLN
5.0000 [drp] | Freq: Two times a day (BID) | OTIC | Status: DC
  Administered 2013-07-11 – 2013-07-13 (×5): 5 [drp] via OTIC
  Filled 2013-07-11: qty 15

## 2013-07-11 NOTE — Progress Notes (Signed)
Patient bladder scanned at 0930 for 559mls. Patient has a condom catheter on and has had very good urine output. Dr. Allyson Sabal notified and stated it was okay to wait until patient voids again and get a PVR on him. Will monitor.  Joellen Jersey, RN.

## 2013-07-11 NOTE — Progress Notes (Addendum)
[facility] PROGRESS NOTE  [name] ZDG:[id number] DOB: 11-24-1931 DOA: 07/10/2013 PCP: [name], MD  Assessment/Plan: Principal Problem:   UTI (lower urinary tract infection) Active Problems:   ATRIAL FIBRILLATION-paroxysmal   Prostate cancer   Sepsis   1. UTI,SIRS, possible sepsis; culture pending  BP improved on IVF, continue broad-spectrum antibiotics Rule out retention, bladder scan ordered and pending  2. Prostate CA, recent BL hydronephrosis, AKI, bladder mass s/p cystoscopy, BL ureteral stents, transurethral resection of bladder tumor on 5/15  -bone scan (5/21): No evidence of skeletal metastasis  -stable renal function; not anuric; Kinnie Feil, MD notifiedDr. Tresa Moore    3. PAF s/p PPM", possibly sinus tachycardia overnight because of fever, Pt was on xarelto which was on hold since the last admission due to surgery"  -No documented hematuria, started on aspirin yesterday because of suspected hematuria instead of xarelto, d/w patient will resume xarelto is no documented hematuria today, with no s/s of bleeding;    4. Anemia, chronic; no s/s of bleeding;  -check iron profile; start PO iron;    5. Hypothyroidism continue Synthroid 6. Left ear pain-ear wax , started on DEBROX, ear lavage in PCP office 7. Thrombocytopenia,-discontinue heparin, Hit panel  Code Status: full Family Communication: family updated about patient's clinical progress Disposition Plan: Possibly 1-2 days   Brief narrative: 80 year old male with past medical history significant for AFIB, prostate cancer s/p partial resection and seed implanting, presenting to the ED for fever. Pt states last night he started feeling very fatigued and chilled so he went to bed early. States woke up this morning and noticed that he felt warm, wife took temperature and was 101.8F so gave advil. States he is feeling somewhat better now. Patient is s/p bilateral ureteral stent placement for  urinary obstruction (secondary to excess prostate tissue) by Dr. Tresa Moore and had an indwelling foley catheter that was removed on Wednesday, 07/07/13. Pt states he has not had much urine output over the past 2 days, mostly dribbles so did have to self cath earlier today. Pt is followed regularly by urology, Dr. Diona Fanti. Does endorse some dysuria but no noted hematuria or flank pain   Consultants:  Urology  Procedures:  None  Antibiotics:  Zosyn vancomycin  HPI/Subjective: Well groomed, comfortable, alert and conversive. Denies any abdominal pain nausea vomiting. Afebrile  Objective: Filed Vitals:   07/10/13 2151 07/11/13 0429 07/11/13 0611 07/11/13 0927  BP:  118/70  90/60  Pulse:  89  80  Temp: 100.1 F (37.8 C) 101.4 F (38.6 C) 99.5 F (37.5 C) 97.8 F (36.6 C)  TempSrc: Oral Oral Oral Oral  Resp:  16  14  Height:      Weight:      SpO2:  94%  93%    Intake/Output Summary (Last 24 hours) at 07/11/13 9518 Last data filed at 07/11/13 0901  Gross per 24 hour  Intake 3142.51 ml  Output   2300 ml  Net 842.51 ml    Exam:  General: alert Eyes: eom-i ENT: no oral ulcers Neck: supple Cardiovascular: s1,s2 rrr Respiratory: CTA BL Abdomen: soft, nt,nd Skin: no rash Musculoskeletal: no LE edema Psychiatric: no hallucinations Neurologic: CN 2-12 intact, motor 5/5 BL    Data Reviewed: Basic Metabolic Panel:  Recent Labs Lab 07/09/13 1840 07/10/13 0610 07/11/13 0525  NA 133* 134* 134*  K 4.1 4.2 3.7  CL 98 97 100  CO2 20 20 19   GLUCOSE 132* 120* 108*  BUN 32* 28* 23  CREATININE 1.22 1.24 1.36*  CALCIUM 8.8 8.7 7.8*    Liver Function Tests:  Recent Labs Lab 07/09/13 1840 07/10/13 0610  AST 22 21  ALT 18 18  ALKPHOS 83 70  BILITOT 0.4 0.6  PROT 6.9 6.5  ALBUMIN 3.6 3.5   No results found for this basename: LIPASE, AMYLASE,  in the last 168 hours No results found for this basename: AMMONIA,  in the last 168 hours  CBC:  Recent Labs Lab  07/09/13 1840 07/10/13 0610 07/11/13 0525  WBC 8.2 9.9 7.8  NEUTROABS 6.8 8.7*  --   HGB 9.9* 9.5* 8.7*  HCT 29.2* 27.5* 25.1*  MCV 96.7 95.2 97.3  PLT 152 124* 100*    Cardiac Enzymes: No results found for this basename: CKTOTAL, CKMB, CKMBINDEX, TROPONINI,  in the last 168 hours BNP (last 3 results) No results found for this basename: PROBNP,  in the last 8760 hours   CBG: No results found for this basename: GLUCAP,  in the last 168 hours  No results found for this or any previous visit (from the past 240 hour(s)).   Studies: Ct Abdomen Pelvis Wo Contrast  06/13/2013   CLINICAL DATA:  Lower abdominal pain, urinary retention, recent bilateral renal stent placement. History of prostate cancer with brachytherapy seeds.  EXAM: CT ABDOMEN AND PELVIS WITHOUT CONTRAST  TECHNIQUE: Multidetector CT imaging of the abdomen and pelvis was performed following the standard protocol without IV contrast.  COMPARISON:  05/30/2013  FINDINGS: Trace left pleural effusion.  Hypodense blood pool relative to myocardium, suggesting anemia. Pacemaker leads, incompletely visualized.  Calcified hepatic and splenic granulomata.  Pancreas and right adrenal gland are within normal limits. Minimal nodular thickening of the left adrenal gland, likely reflecting an 8 mm left adrenal adenoma (series 2/image 24).  Gallbladder is unremarkable. No intrahepatic or extrahepatic ductal dilatation.  Mild fullness of the bilateral renal collecting systems with indwelling double pigtail ureteral stents. Kidneys are otherwise unremarkable. No renal calculi.  No evidence of bowel obstruction.  Atherosclerotic calcifications of the abdominal aorta and branch vessels.  No abdominopelvic ascites.  No suspicious abdominopelvic lymphadenopathy.  Brachytherapy seeds in the prostate.  No ureteral or bladder calculi. Bladder is moderately distended with an indwelling Foley catheter. Associated nondependent gas within the bladder. Suspected  layering debris/hemorrhage within the bladder (series 2/image 35), although abnormal soft tissue is also possible.  Mild degenerative changes of the visualized thoracolumbar spine, most prominent at L1-2.  IMPRESSION: No renal, ureteral, or bladder calculi.  Mild fullness of the bilateral renal collecting systems, improved, with indwelling double pigtail ureteral stents.  Bladder is moderately distended with an indwelling Foley catheter. If catheter was not clamped prior to CT, correlate for catheter patency.  Suspected layering debris/hemorrhage within the bladder, although abnormal soft tissue is also possible. Correlate with recent cystoscopy.  Brachytherapy seeds in the prostate. No findings to suggest metastatic disease in the abdomen/pelvis.   Electronically Signed   By: Julian Hy M.D.   On: 06/13/2013 13:28   Dg Chest Portable 1 View  07/10/2013   CLINICAL DATA:  Sepsis  EXAM: PORTABLE CHEST - 1 VIEW  COMPARISON:  06/13/2013  FINDINGS: Stable orientation of a dual-chamber left approach pacer. No cardiomegaly. Unchanged mild aortic tortuosity.  No consolidation, edema, effusion, or pneumothorax. There is minimal scarring or atelectasis at the peripheral left base. Chronic fragmentation of the lateral left clavicle.  IMPRESSION: No active disease.   Electronically Signed   By: Gilford Silvius.D.  On: 07/10/2013 07:03   Dg Abd Acute W/chest  06/13/2013   CLINICAL DATA:  Lower left back pain for 2 days, bilateral ureteral stents placed 05/30/2013, gross hematuria.  EXAM: ACUTE ABDOMEN SERIES (ABDOMEN 2 VIEW & CHEST 1 VIEW)  COMPARISON:  Multiple recent prior studies  FINDINGS: Heart size and vascular pattern are normal. Lungs are clear. Cardiac pacer stable. Emphysematous change stable.  No free air in the abdomen. Bilateral ureteral stents identified. Distal loop of right ureteral stent is mostly uncoiled. Brachytherapy seeds are identified in the prostate. There is a nonobstructive bowel gas  pattern.  IMPRESSION: No acute cardiopulmonary abnormalities next item bilateral ureteral stents in anticipated position. Distal loop of right stent is mostly uncoiled.   Electronically Signed   By: Skipper Cliche M.D.   On: 06/13/2013 11:12   Dg Abd Portable 1v  07/10/2013   CLINICAL DATA:  Urinary tract infection  EXAM: PORTABLE ABDOMEN - 1 VIEW  COMPARISON:  06/13/2013  FINDINGS: Similar orientation of bilateral internal ureteral stents. The right lower limb is not formed, but is stable from previous. There is no visible stone along the course of the ureters or overlapping the renal shadows. The low pelvis is not imaged.  No abnormal intra-abdominal mass effect or calcification. Clear lung bases.  IMPRESSION: Unchanged positioning of bilateral internal ureteral stents.   Electronically Signed   By: Jorje Guild M.D.   On: 07/10/2013 07:06    Scheduled Meds: . aspirin EC  81 mg Oral Daily  . calcium-vitamin D  1 tablet Oral BID  . carbamide peroxide  5 drop Both Ears BID  . heparin  5,000 Units Subcutaneous 3 times per day  . levothyroxine  137 mcg Oral QAC breakfast  . multivitamin with minerals  1 tablet Oral Daily  . piperacillin-tazobactam (ZOSYN)  IV  3.375 g Intravenous 3 times per day  . propranolol ER  60 mg Oral Daily  . tamsulosin  0.4 mg Oral Daily  . vancomycin  750 mg Intravenous Q12H   Continuous Infusions: . sodium chloride 125 mL/hr at 07/11/13 7741    Principal Problem:   UTI (lower urinary tract infection) Active Problems:   ATRIAL FIBRILLATION-paroxysmal   Prostate cancer   Sepsis    Time spent: 40 minutes   [facility] Pager [phone]. If 8PM-8AM, please contact night-coverage at [email or web address], password [facility] 07/11/2013, 9:52 AM  LOS: 1 day

## 2013-07-12 LAB — URINE CULTURE
Colony Count: >=100000
Colony Count: >=100000

## 2013-07-12 LAB — COMPREHENSIVE METABOLIC PANEL
ALK PHOS: 61 U/L (ref 39–117)
ALT: 18 U/L (ref 0–53)
AST: 23 U/L (ref 0–37)
Albumin: 2.6 g/dL — ABNORMAL LOW (ref 3.5–5.2)
BUN: 18 mg/dL (ref 6–23)
CALCIUM: 8 mg/dL — AB (ref 8.4–10.5)
CO2: 20 meq/L (ref 19–32)
Chloride: 103 mEq/L (ref 96–112)
Creatinine, Ser: 1.31 mg/dL (ref 0.50–1.35)
GFR calc non Af Amer: 50 mL/min — ABNORMAL LOW (ref >90)
GFR, EST AFRICAN AMERICAN: 58 mL/min — AB (ref >90)
GLUCOSE: 103 mg/dL — AB (ref 70–99)
Potassium: 3.8 mEq/L (ref 3.7–5.3)
SODIUM: 136 meq/L — AB (ref 137–147)
Total Bilirubin: 0.3 mg/dL (ref 0.3–1.2)
Total Protein: 5.5 g/dL — ABNORMAL LOW (ref 6.0–8.3)

## 2013-07-12 LAB — CBC
HCT: 23.5 % — ABNORMAL LOW (ref 39.0–52.0)
HEMOGLOBIN: 8.1 g/dL — AB (ref 13.0–17.0)
MCH: 33.2 pg (ref 26.0–34.0)
MCHC: 34.5 g/dL (ref 30.0–36.0)
MCV: 96.3 fL (ref 78.0–100.0)
Platelets: 107 10*3/uL — ABNORMAL LOW (ref 150–400)
RBC: 2.44 MIL/uL — AB (ref 4.22–5.81)
RDW: 12.7 % (ref 11.5–15.5)
WBC: 7.1 10*3/uL (ref 4.0–10.5)

## 2013-07-12 LAB — VANCOMYCIN, TROUGH: VANCOMYCIN TR: 15.1 ug/mL (ref 10.0–20.0)

## 2013-07-12 MED ORDER — RIVAROXABAN 20 MG PO TABS: 20.0000 mg | ORAL_TABLET | Freq: Every day | ORAL | Status: DC

## 2013-07-12 MED ORDER — CIPROFLOXACIN HCL 500 MG PO TABS: 500.0000 mg | ORAL_TABLET | Freq: Two times a day (BID) | ORAL | Status: DC

## 2013-07-12 MED ORDER — RIVAROXABAN 20 MG PO TABS
20.0000 mg | ORAL_TABLET | Freq: Every day | ORAL | Status: DC
  Administered 2013-07-12: 20 mg via ORAL
  Filled 2013-07-12 (×2): qty 1

## 2013-07-12 MED ORDER — CIPROFLOXACIN HCL 500 MG PO TABS
500.0000 mg | ORAL_TABLET | Freq: Two times a day (BID) | ORAL | Status: DC
  Administered 2013-07-12 – 2013-07-13 (×3): 500 mg via ORAL
  Filled 2013-07-12 (×5): qty 1

## 2013-07-12 MED ORDER — CARBAMIDE PEROXIDE 6.5 % OT SOLN: 5.0000 [drp] | Freq: Two times a day (BID) | OTIC | Status: DC

## 2013-07-12 NOTE — Care Management Note (Signed)
CARE MANAGEMENT NOTE 07/12/2013  Patient:  [name], [name]   Account Number:  [id number]  Date Initiated:  07/12/2013  Documentation initiated by:  ROYAL,CHERYL  Subjective/Objective Assessment:   Referral for [facility] needs. DME ordered.     Action/Plan:   Met with pt and wife re HH , DME to be delivered to pt room.   Anticipated DC Date:  07/12/2013   Anticipated DC Plan:  [place]         [facility] Choice  HOME HEALTH   Choice offered to / List presented to:  C-1 Patient   DME arranged  [facility]  3-N-1      DME agency  [place].        Status of service:  Completed, signed off Medicare Important Message given?   (If response is "NO", the following Medicare IM given date fields will be blank) Date Medicare IM given:   Date Additional Medicare IM given:    Discharge Disposition:    Per UR Regulation:    If discussed at Long Length of Stay Meetings, dates discussed:    Comments:  07/12/2013 Met with pt and wife, wife very anxious re d/c , needs assistance in the home to assist the pt . Info on home care agencies provided to pt and wife as they intend to pursue [facility] aide services. Both pt and wife declined HHPT and [place] services. DME delivered to pt room. Both instructed to contact pt PCP , Dr Felipa Eth, if they determine after d/c that HHPT and/or [place] are needed. Jasmine Pang RN MPH, case manager [phone]

## 2013-07-12 NOTE — Discharge Summary (Signed)
Physician Discharge Summary  KALVEN GANIM MRN: [id number] DOB/AGE: 03/08/1931 80 y.o.  PCP: [name], MD   Admit date: 07/10/2013 Discharge date: 07/12/2013  Discharge Diagnoses:   : PSEUDOMONAS AERUGINOSA  UTI (lower urinary tract infection) Active Problems:   ATRIAL FIBRILLATION-paroxysmal   Prostate cancer   Sepsis   Follow up recommendations #1 repeat CBC in one week to monitor hemoglobin and platelet count #2 repeat BMP in one week #3 repeat UA after completion of antibiotics #4 monitor HIT panel results    Medication List    STOP taking these medications       cephALEXin 500 MG capsule  Commonly known as:  KEFLEX     ibuprofen 200 MG tablet  Commonly known as:  ADVIL,MOTRIN      TAKE these medications       calcium citrate-vitamin D 315-200 MG-UNIT per tablet  Commonly known as:  CITRACAL+D  Take 1 tablet by mouth 2 (two) times daily.     carbamide peroxide 6.5 % otic solution  Commonly known as:  DEBROX  Place 5 drops into both ears 2 (two) times daily.     CEREFOLIN NAC 6-2-600 MG Tabs  Take 1 tablet by mouth daily.     ciprofloxacin 500 MG tablet  Commonly known as:  CIPRO  Take 1 tablet (500 mg total) by mouth 2 (two) times daily.     docusate sodium 100 MG capsule  Commonly known as:  COLACE  Take 1 capsule (100 mg total) by mouth every 12 (twelve) hours.     Fish Oil 500 MG Caps  Take 500 mg by mouth 2 (two) times daily.     GLUCOSAMINE 1500 COMPLEX Caps  Take 1 capsule by mouth 2 (two) times daily.     HYDROcodone-acetaminophen 5-325 MG per tablet  Commonly known as:  NORCO/VICODIN  Take 1-2 tablets by mouth every 6 (six) hours as needed for moderate pain or severe pain.     levothyroxine 137 MCG tablet  Commonly known as:  SYNTHROID, LEVOTHROID  Take 137 mcg by mouth daily before breakfast.     LUBRICATING EYE DROPS 0.5-0.9 % Soln  Generic drug:  Carboxymethylcellul-Glycerin  Apply 1 drop to eye daily as needed  (dry eyes).     Melatonin 3 MG Tabs  Take 3 mg by mouth at bedtime.     multivitamin with minerals Tabs tablet  Take 1 tablet by mouth daily.     propranolol ER 60 MG 24 hr capsule  Commonly known as:  INDERAL LA  Take 60 mg by mouth daily.     rivaroxaban 20 MG Tabs tablet  Commonly known as:  XARELTO  Take 1 tablet (20 mg total) by mouth daily with supper.     solifenacin 10 MG tablet  Commonly known as:  VESICARE  Take 10 mg by mouth daily.     tamsulosin 0.4 MG Caps capsule  Commonly known as:  FLOMAX  Take 0.4 mg by mouth daily.        Discharge Condition: Stable   Disposition: 01-Home or Self Care   Consults: None*   Significant Diagnostic Studies: Ct Abdomen Pelvis Wo Contrast  06/13/2013   CLINICAL DATA:  Lower abdominal pain, urinary retention, recent bilateral renal stent placement. History of prostate cancer with brachytherapy seeds.  EXAM: CT ABDOMEN AND PELVIS WITHOUT CONTRAST  TECHNIQUE: Multidetector CT imaging of the abdomen and pelvis was performed following the standard protocol without IV contrast.  COMPARISON:  05/30/2013  FINDINGS: Trace left pleural effusion.  Hypodense blood pool relative to myocardium, suggesting anemia. Pacemaker leads, incompletely visualized.  Calcified hepatic and splenic granulomata.  Pancreas and right adrenal gland are within normal limits. Minimal nodular thickening of the left adrenal gland, likely reflecting an 8 mm left adrenal adenoma (series 2/image 24).  Gallbladder is unremarkable. No intrahepatic or extrahepatic ductal dilatation.  Mild fullness of the bilateral renal collecting systems with indwelling double pigtail ureteral stents. Kidneys are otherwise unremarkable. No renal calculi.  No evidence of bowel obstruction.  Atherosclerotic calcifications of the abdominal aorta and branch vessels.  No abdominopelvic ascites.  No suspicious abdominopelvic lymphadenopathy.  Brachytherapy seeds in the prostate.  No ureteral or  bladder calculi. Bladder is moderately distended with an indwelling Foley catheter. Associated nondependent gas within the bladder. Suspected layering debris/hemorrhage within the bladder (series 2/image 35), although abnormal soft tissue is also possible.  Mild degenerative changes of the visualized thoracolumbar spine, most prominent at L1-2.  IMPRESSION: No renal, ureteral, or bladder calculi.  Mild fullness of the bilateral renal collecting systems, improved, with indwelling double pigtail ureteral stents.  Bladder is moderately distended with an indwelling Foley catheter. If catheter was not clamped prior to CT, correlate for catheter patency.  Suspected layering debris/hemorrhage within the bladder, although abnormal soft tissue is also possible. Correlate with recent cystoscopy.  Brachytherapy seeds in the prostate. No findings to suggest metastatic disease in the abdomen/pelvis.   Electronically Signed   By: Julian Hy M.D.   On: 06/13/2013 13:28   Dg Chest Portable 1 View  07/10/2013   CLINICAL DATA:  Sepsis  EXAM: PORTABLE CHEST - 1 VIEW  COMPARISON:  06/13/2013  FINDINGS: Stable orientation of a dual-chamber left approach pacer. No cardiomegaly. Unchanged mild aortic tortuosity.  No consolidation, edema, effusion, or pneumothorax. There is minimal scarring or atelectasis at the peripheral left base. Chronic fragmentation of the lateral left clavicle.  IMPRESSION: No active disease.   Electronically Signed   By: Jorje Guild M.D.   On: 07/10/2013 07:03   Dg Abd Acute W/chest  06/13/2013   CLINICAL DATA:  Lower left back pain for 2 days, bilateral ureteral stents placed 05/30/2013, gross hematuria.  EXAM: ACUTE ABDOMEN SERIES (ABDOMEN 2 VIEW & CHEST 1 VIEW)  COMPARISON:  Multiple recent prior studies  FINDINGS: Heart size and vascular pattern are normal. Lungs are clear. Cardiac pacer stable. Emphysematous change stable.  No free air in the abdomen. Bilateral ureteral stents identified.  Distal loop of right ureteral stent is mostly uncoiled. Brachytherapy seeds are identified in the prostate. There is a nonobstructive bowel gas pattern.  IMPRESSION: No acute cardiopulmonary abnormalities next item bilateral ureteral stents in anticipated position. Distal loop of right stent is mostly uncoiled.   Electronically Signed   By: Skipper Cliche M.D.   On: 06/13/2013 11:12   Dg Abd Portable 1v  07/10/2013   CLINICAL DATA:  Urinary tract infection  EXAM: PORTABLE ABDOMEN - 1 VIEW  COMPARISON:  06/13/2013  FINDINGS: Similar orientation of bilateral internal ureteral stents. The right lower limb is not formed, but is stable from previous. There is no visible stone along the course of the ureters or overlapping the renal shadows. The low pelvis is not imaged.  No abnormal intra-abdominal mass effect or calcification. Clear lung bases.  IMPRESSION: Unchanged positioning of bilateral internal ureteral stents.   Electronically Signed   By: Jorje Guild M.D.   On: 07/10/2013 07:06     Microbiology:  Recent Results (from the past 240 hour(s))  URINE CULTURE     Status: None   Collection Time    07/09/13  7:27 PM      Result Value Ref Range Status   Specimen Description URINE, RANDOM   Final   Special Requests NONE   Final   Culture  Setup Time     Final   Value: 07/10/2013 15:34     Performed at [place]     Final   Value: >=100,000 COLONIES/ML     Performed at [facility]   Culture     Final   Value: PSEUDOMONAS AERUGINOSA     Performed at [facility]   Report Status 07/12/2013 FINAL   Final   Organism ID, Bacteria PSEUDOMONAS AERUGINOSA   Final  URINE CULTURE     Status: None   Collection Time    07/10/13  5:45 AM      Result Value Ref Range Status   Specimen Description URINE, RANDOM   Final   Special Requests NONE   Final   Culture  Setup Time     Final   Value: 07/10/2013 15:33     Performed at [place]     Final   Value: >=100,000 COLONIES/ML     Performed at [facility]   Culture     Final   Value: PSEUDOMONAS AERUGINOSA     Performed at [facility]   Report Status 07/12/2013 FINAL   Final   Organism ID, Bacteria PSEUDOMONAS AERUGINOSA   Final  CULTURE, BLOOD (ROUTINE X 2)     Status: None   Collection Time    07/10/13  6:10 AM      Result Value Ref Range Status   Specimen Description BLOOD RIGHT FOREARM   Final   Special Requests BOTTLES DRAWN AEROBIC AND ANAEROBIC 6CC   Final   Culture  Setup Time     Final   Value: 07/10/2013 15:39     Performed at [facility]   Culture     Final   Value:        BLOOD CULTURE RECEIVED NO GROWTH TO DATE CULTURE WILL BE HELD FOR 5 DAYS BEFORE ISSUING A FINAL NEGATIVE REPORT     Performed at [facility]   Report Status PENDING   Incomplete  CULTURE, BLOOD (ROUTINE X 2)     Status: None   Collection Time    07/10/13  6:15 AM      Result Value Ref Range Status   Specimen Description BLOOD LEFT ARM   Final   Special Requests BOTTLES DRAWN AEROBIC ONLY 6CC   Final   Culture  Setup Time     Final   Value: 07/10/2013 15:39     Performed at [facility]   Culture     Final   Value:        BLOOD CULTURE RECEIVED NO GROWTH TO DATE CULTURE WILL BE HELD FOR 5 DAYS BEFORE ISSUING A FINAL NEGATIVE REPORT     Performed at [facility]   Report Status PENDING   Incomplete     Labs: Results for orders placed during the hospital encounter of 07/10/13 (from the past 48 hour(s))  BASIC METABOLIC PANEL     Status: Abnormal   Collection Time    07/11/13  5:25 AM      Result Value Ref  Range   Sodium 134 (*) 137 - 147 mEq/L   Potassium 3.7  3.7 - 5.3 mEq/L   Chloride 100  96 - 112 mEq/L   CO2 19  19 - 32 mEq/L   Glucose, Bld 108 (*) 70 - 99 mg/dL   BUN 23  6 - 23 mg/dL   Creatinine, Ser 1.36 (*) 0.50 - 1.35 mg/dL   Calcium 7.8 (*) 8.4 - 10.5 mg/dL   GFR calc non Af Amer 48 (*) >90 mL/min   GFR  calc Af Amer 55 (*) >90 mL/min   Comment: (NOTE)     The eGFR has been calculated using the CKD EPI equation.     This calculation has not been validated in all clinical situations.     eGFR's persistently <90 mL/min signify possible Chronic Kidney     Disease.  CBC     Status: Abnormal   Collection Time    07/11/13  5:25 AM      Result Value Ref Range   WBC 7.8  4.0 - 10.5 K/uL   RBC 2.58 (*) 4.22 - 5.81 MIL/uL   Hemoglobin 8.7 (*) 13.0 - 17.0 g/dL   HCT 25.1 (*) 39.0 - 52.0 %   MCV 97.3  78.0 - 100.0 fL   MCH 33.7  26.0 - 34.0 pg   MCHC 34.7  30.0 - 36.0 g/dL   RDW 12.8  11.5 - 15.5 %   Platelets 100 (*) 150 - 400 K/uL   Comment: REPEATED TO VERIFY     PLATELET COUNT CONFIRMED BY SMEAR  CBC     Status: Abnormal   Collection Time    07/12/13  3:55 AM      Result Value Ref Range   WBC 7.1  4.0 - 10.5 K/uL   RBC 2.44 (*) 4.22 - 5.81 MIL/uL   Hemoglobin 8.1 (*) 13.0 - 17.0 g/dL   HCT 23.5 (*) 39.0 - 52.0 %   MCV 96.3  78.0 - 100.0 fL   MCH 33.2  26.0 - 34.0 pg   MCHC 34.5  30.0 - 36.0 g/dL   RDW 12.7  11.5 - 15.5 %   Platelets 107 (*) 150 - 400 K/uL   Comment: CONSISTENT WITH PREVIOUS RESULT  COMPREHENSIVE METABOLIC PANEL     Status: Abnormal   Collection Time    07/12/13  3:55 AM      Result Value Ref Range   Sodium 136 (*) 137 - 147 mEq/L   Potassium 3.8  3.7 - 5.3 mEq/L   Chloride 103  96 - 112 mEq/L   CO2 20  19 - 32 mEq/L   Glucose, Bld 103 (*) 70 - 99 mg/dL   BUN 18  6 - 23 mg/dL   Creatinine, Ser 1.31  0.50 - 1.35 mg/dL   Calcium 8.0 (*) 8.4 - 10.5 mg/dL   Total Protein 5.5 (*) 6.0 - 8.3 g/dL   Albumin 2.6 (*) 3.5 - 5.2 g/dL   AST 23  0 - 37 U/L   ALT 18  0 - 53 U/L   Alkaline Phosphatase 61  39 - 117 U/L   Total Bilirubin 0.3  0.3 - 1.2 mg/dL   GFR calc non Af Amer 50 (*) >90 mL/min   GFR calc Af Amer 58 (*) >90 mL/min   Comment: (NOTE)     The eGFR has been calculated using the CKD EPI equation.     This calculation has not been validated in all  clinical  situations.     eGFR's persistently <90 mL/min signify possible Chronic Kidney     Disease.  VANCOMYCIN, TROUGH     Status: None   Collection Time    07/12/13  7:05 AM      Result Value Ref Range   Vancomycin Tr 15.1  10.0 - 20.0 ug/mL     HPI :*  80 year old male with past medical history significant for AFIB, prostate cancer s/p partial resection and seed implanting, presenting to the ED for fever. Pt states last night he started feeling very fatigued and chilled so he went to bed early. States woke up this morning and noticed that he felt warm, wife took temperature and was 101.72F so gave advil. States he is feeling somewhat better now. Patient is s/p bilateral ureteral stent placement for urinary obstruction (secondary to excess prostate tissue) by Dr. Tresa Moore and had an indwelling foley catheter that was removed on Wednesday, 07/07/13. Pt states he has not had much urine output over the past 2 days, mostly dribbles so did have to self cath earlier today. Pt is followed regularly by urology, Dr. Diona Fanti. Does endorse some dysuria but no noted hematuria or flank pain   HOSPITAL COURSE:   1. Pseudomonas UTI,SIRS, urine culture showed pansensitive Pseudomonas Patient presented with borderline low blood pressure of systolic, fever of 673, initially started on vancomycin cefepime, subsequently narrowed down to oral ciprofloxacin after urine culture results were made available No evidence of urinary retention, Foley catheter removed/24,  Patient had excellent urine output during this admission. No evidence of hematuria either Repeat UA to ensure resolution of infection in about 7-10 days  2. Prostate CA, recent BL hydronephrosis, AKI, bladder mass s/p cystoscopy, BL ureteral stents, transurethral resection of bladder tumor on 5/15  -bone scan (5/21): No evidence of skeletal metastasis  ; Kinnie Feil, MD notifiedDr. Tresa Moore  Patient will continue to follow up with outpatient  urology  3. PAF -s/p abalation,s/p PPM , possibly sinus tachycardia overnight because of fever, Pt was on xarelto which was on hold since the last admission due to surgery"  -xarelto resumed prior to discharge because of stable hemoglobin and no evidence of any hematuria or bleeding  4. Anemia, chronic baseline  of 9.5; no s/s of bleeding; hemoglobin stable -check iron profile; patient will need a repeat CBC and anemia workup in the outpatient setting He is on xarelto, therefore will need close monitoring of her CBC. Platelet count decreased to 100k during this hospitalization. HIT panel pending. Primary care provider to followup on the results  5. Hypothyroidism continue Synthroid  6. Left ear pain-ear wax , started on DEBROX, ear lavage in PCP office  7. Thrombocytopenia,-discontinue heparin, Hit panel    Code Status: full   Discharge Exam:   Blood pressure 106/74, pulse 90, temperature 97.5 F (36.4 C), temperature source Oral, resp. rate 20, height 6' (1.829 m), weight 80.2 kg (176 lb 12.9 oz), SpO2 99.00%. General: alert  Eyes: eom-i  ENT: no oral ulcers  Neck: supple  Cardiovascular: s1,s2 rrr  RespiratoryBL  Abdomen: soft, nt,nd  Skin: no rash  Musculoskeletal: no LE edema  Psychiatric: no hallucinations  Neurologic: CN 2-12 intact, motor 5/5 BL:         Discharge Instructions   Call MD for:  persistant nausea and vomiting    Complete by:  As directed      Call MD for:  severe uncontrolled pain    Complete by:  As directed  Call MD for:  temperature >100.4    Complete by:  As directed      Diet - low sodium heart healthy    Complete by:  As directed      Increase activity slowly    Complete by:  As directed            Follow-up Information   Follow up with [name], MD In 1 week.   Specialty:  Internal Medicine   Contact information:   [address] [place] [address] [phone]       Follow up with Alexis Frock, MD In 2 weeks.   Specialty:  Urology   Contact information:   [place] [address] [phone]       Signed: Reyne Dumas 07/12/2013, 9:58 AM

## 2013-07-12 NOTE — Evaluation (Addendum)
Physical Therapy Evaluation Patient Details Name: [name] MRN: [id number] DOB: 11/13/1931 Today's Date: 07/12/2013   History of Present Illness  HPI: [name] is a 80 y.o. male with PMH of A fib, s/p PPM, prostate CA with recent BL hydronephrosis, AKI, bladder mass s/p cystoscopy,  BL ureteral stents, transurethral resection of bladder tumor on 5/15 presented with fever, chills, generalized weakness, dysuria and found to have UTI, hypotension  Clinical Impression   Patient evaluated by Physical Therapy with no further acute PT needs identified. All education has been completed and the patient has no further questions.  See below for any follow-up Physical Therapy or equipment needs. PT is signing off. Thank you for this referral.     Follow Up Recommendations Home health PT (as well as RN for chronic disease management)    Equipment Recommendations  Rolling walker with 5" wheels;3in1 (PT)    Recommendations for Other Services       Precautions / Restrictions Precautions Precautions: Fall Precaution Comments: Instructed pt to get up slowly, due to potential for orthostatic hypotension      Mobility  Bed Mobility Overal bed mobility: Modified Independent                Transfers Overall transfer level: Needs assistance Equipment used: None Transfers: Sit to/from Stand Sit to Stand: Supervision         General transfer comment: Cues to self-monitor for activity tolerance  Ambulation/Gait Ambulation/Gait assistance: Supervision Ambulation Distance (Feet): 250 Feet Assistive device: None;1 person hand held assist;Rolling walker (2 wheeled) Gait Pattern/deviations: Decreased stride length     General Gait Details: initiated amb with no device, but noted pt tending to reach out for UE suppot; Managed quite well with RW for bilateral support  Stairs            Wheelchair Mobility    Modified Rankin (Stroke Patients Only)       Balance  Overall balance assessment: No apparent balance deficits (not formally assessed)                                           Pertinent Vitals/Pain no apparent distress See vitals flow sheet.     Home Living Family/patient expects to be discharged to:: Private residence Living Arrangements: Spouse/significant other Available Help at Discharge: Family;Other (Comment) (wife unable to provide physical assist) Type of Home: Apartment Home Access: Level entry     Home Layout: One level Home Equipment: Other (comment) (pt says he has cane and RW available to him)      Prior Function Level of Independence: Independent               Hand Dominance        Extremity/Trunk Assessment   Upper Extremity Assessment: Overall WFL for tasks assessed           Lower Extremity Assessment: Overall WFL for tasks assessed         Communication   Communication: No difficulties  Cognition Arousal/Alertness: Awake/alert Behavior During Therapy: WFL for tasks assessed/performed Overall Cognitive Status: Within Functional Limits for tasks assessed                      General Comments      Exercises        Assessment/Plan    PT Assessment All further PT needs  can be met in the next venue of care  PT Diagnosis Difficulty walking   PT Problem List Decreased activity tolerance;Decreased balance;Decreased mobility;Decreased knowledge of use of DME  PT Treatment Interventions     PT Goals (Current goals can be found in the Care Plan section) Acute Rehab PT Goals Patient Stated Goal: wanting to walk PT Goal Formulation: No goals set, d/c therapy (secondary to imminent dc)    Frequency     Barriers to discharge        Co-evaluation               End of Session Equipment Utilized During Treatment: Gait belt Activity Tolerance: Patient tolerated treatment well Patient left: with call bell/phone within reach;Other (comment) (brushing teeth  at sink) Nurse Communication: Mobility status         Time: 0034-9179 PT Time Calculation (min): 24 min   Charges:   PT Evaluation $Initial PT Evaluation Tier I: 1 Procedure PT Treatments $Gait Training: 8-22 mins   PT G Codes:          Quin Hoop 07/12/2013, 10:47 AM Roney Marion, PT  Acute Rehabilitation Services Pager [phone] Office [phone]

## 2013-07-12 NOTE — Progress Notes (Addendum)
Patient 's wife concerned about urinary retention, explained to her that the patient had good urinary output yesterday with no signs of retention. He urinated almost 4-1/2 L. patient showed me his urinal that had a small fleshy substance that was obstructing the flow of urine. However there was no blood in his urine. The patient ambulated with physical therapy and walked 21ft . Will notify urology again, although the patient does not have any signs of complete retention, hematuria etc. He may have intermittent retention because of the passage of old  debris. She wants me to cancel the discharge and monitor him for another day. There is no medical indication at this time for continued hospital care unless urology recommends him to do so.  Discussed with Dr Jeffie Pollock, he will see him this evening

## 2013-07-12 NOTE — Consult Note (Signed)
Subjective: I was asked to see [name] in consultation by Dr. Allyson Sabal for voiding issues.   [name] is a patient of Dr. Diona Fanti with locally advanced prostate cancer.  He is on androgen ablation and has required TUR and stents in May.   He was seen last on 6/24 and was instructed in CIC.   He was seen in the ER on 6/26 and was found to have a UTI and was placed on keflex.   His culture grew a resistant pseudamonas and he was admitted on 6/27 with possible sepsis and placed on Cipro.  He required diuresis for fluid overload and has been voiding ok but has passed some old clots and was concerned that he might continue to have issues but his urine is clear and his PVR this afternoon was 81ml.  ROS:  Review of Systems  Constitutional: Positive for fever (low grade).  Gastrointestinal: Negative.   Genitourinary: Positive for urgency. Negative for dysuria and hematuria.  Psychiatric/Behavioral: Positive for memory loss.  All other systems reviewed and are negative.   No Known Allergies  Past Medical History  Diagnosis Date  . Bradycardia     s/p PPM  . Atrial flutter     s/p ablation  . Atrial fibrillation   . Prostate cancer 12-21-2003    12-7-seed implants done  . Hematoma   . Mitral regurgitation     mild echo 2012  . Pacemaker --[employer]   . Orthostatic lightheadedness   . Subdural hematoma   . Anemia due to GI blood loss   . Prostate cancer     Past Surgical History  Procedure Laterality Date  . Tonsillectomy  as child  . Arthrscopic left knee surgery  02-11-2007  . Cardioversion  09-04-2006  . Av node ablation  04-06-2007  . Subdural hematoma evacuation via craniotomy  12-16-2007  . Rotator cuff repair w/ distal clavicle excision  08-31-2010    left  . Pacemaker insertion      [employer]ign[employer]   . Total knee arthroplasty  12/18/2010    Procedure: TOTAL KNEE ARTHROPLASTY;  Surgeon: Mauri Pole;  Location: [facility];  Service: Orthopedics;  Laterality: Left;   . Joint replacement    . Brain surgery    . Cardioversion    . Cataract extraction    . Left knee arthroscopy    . Cystoscopy w/ ureteral stent placement Bilateral 05/30/2013    Procedure: CYSTOSCOPY WITH RETROGRADE PYELOGRAM/ BILATERAL URETERAL STENT PLACEMENT, TRANSURETRAL RESECTION OF BLADDER TUMOR WITH GYRUS;  Surgeon: Alexis Frock, MD;  Location: [facility];  Service: Urology;  Laterality: Bilateral;    History   Social History  . Marital Status: Married    Spouse Name: N/A    Number of Children: 2  . Years of Education: BA   Occupational History  .     Social History Main Topics  . Smoking status: Never Smoker   . Smokeless tobacco: Never Used  . Alcohol Use: 7.7 oz/week    7 Glasses of wine, 7 Drinks containing 0.5 oz of alcohol per week     Comment: 2 glasses wine day  . Drug Use: No  . Sexual Activity: Not on file   Other Topics Concern  . Not on file   Social History Narrative   Patient lives at home with his spouse.   Caffeine Use: 2 cups of coffee    Family History  Problem Relation Age of Onset  . Aneurysm  Father   . Stroke Father      Anti-infectives: Anti-infectives   Start     Dose/Rate Route Frequency Ordered Stop   07/12/13 0815  ciprofloxacin (CIPRO) tablet 500 mg     500 mg Oral 2 times daily 07/12/13 0800     07/12/13 0000  ciprofloxacin (CIPRO) 500 MG tablet     500 mg Oral 2 times daily 07/12/13 0957     07/10/13 2000  vancomycin (VANCOCIN) IVPB 750 mg/150 ml premix  Status:  Discontinued     750 mg 150 mL/hr over 60 Minutes Intravenous Every 12 hours 07/10/13 1204 07/12/13 0800   07/10/13 1500  piperacillin-tazobactam (ZOSYN) IVPB 3.375 g  Status:  Discontinued     3.375 g 12.5 mL/hr over 240 Minutes Intravenous 3 times per day 07/10/13 0932 07/12/13 0800   07/10/13 0645  piperacillin-tazobactam (ZOSYN) IVPB 3.375 g     3.375 g 100 mL/hr over 30 Minutes Intravenous  Once 07/10/13 0638 07/10/13 0732   07/10/13 0645  vancomycin  (VANCOCIN) 1,250 mg in sodium chloride 0.9 % 250 mL IVPB     1,250 mg 166.7 mL/hr over 90 Minutes Intravenous  Once 07/10/13 3151 07/10/13 7616      Current Facility-Administered Medications  Medication Dose Route Frequency [place] Scheiderer Last Rate Last Dose  . acetaminophen (TYLENOL) tablet 650 mg  650 mg Oral Q6H PRN Kinnie Feil, MD   650 mg at 07/11/13 0737   Or  . acetaminophen (TYLENOL) suppository 650 mg  650 mg Rectal Q6H PRN Kinnie Feil, MD      . calcium-vitamin D (OSCAL WITH D) 500-200 MG-UNIT per tablet 1 tablet  1 tablet Oral BID Kinnie Feil, MD   1 tablet at 07/12/13 1017  . carbamide peroxide (DEBROX) 6.5 % otic solution 5 drop  5 drop Both Ears BID Reyne Dumas, MD   5 drop at 07/12/13 1017  . ciprofloxacin (CIPRO) tablet 500 mg  500 mg Oral BID Reyne Dumas, MD   500 mg at 07/12/13 1062  . HYDROcodone-acetaminophen (NORCO/VICODIN) 5-325 MG per tablet 1-2 tablet  1-2 tablet Oral Q6H PRN Kinnie Feil, MD   2 tablet at 07/12/13 0026  . levothyroxine (SYNTHROID, LEVOTHROID) tablet 137 mcg  137 mcg Oral QAC breakfast Kinnie Feil, MD   137 mcg at 07/12/13 6948  . multivitamin with minerals tablet 1 tablet  1 tablet Oral Daily Kinnie Feil, MD   1 tablet at 07/12/13 1017  . ondansetron (ZOFRAN) tablet 4 mg  4 mg Oral Q6H PRN Kinnie Feil, MD       Or  . ondansetron (ZOFRAN) injection 4 mg  4 mg Intravenous Q6H PRN Kinnie Feil, MD   4 mg at 07/10/13 1205  . polyvinyl alcohol (LIQUIFILM TEARS) 1.4 % ophthalmic solution 1 drop  1 drop Both Eyes Daily PRN Kinnie Feil, MD      . propranolol ER (INDERAL LA) 24 hr capsule 60 mg  60 mg Oral Daily Kinnie Feil, MD   60 mg at 07/12/13 1017  . rivaroxaban (XARELTO) tablet 20 mg  20 mg Oral Q supper Reyne Dumas, MD   20 mg at 07/12/13 1740  . sodium chloride 0.9 % bolus 250 mL  250 mL Intravenous PRN Kinnie Feil, MD      . tamsulosin (FLOMAX) capsule 0.4 mg  0.4 mg Oral Daily Kinnie Feil, MD    0.4 mg at 07/12/13 1017  Objective: Vital signs in last 24 hours: Temp:  [97.5 F (36.4 C)-99.4 F (37.4 C)] 99.4 F (37.4 C) (06/29 1809) Pulse Rate:  [66-90] 80 (06/29 1809) Resp:  [18-20] 18 (06/29 1809) BP: (106-133)/(52-78) 133/78 mmHg (06/29 1809) SpO2:  [96 %-99 %] 96 % (06/29 1809) Weight:  [80.2 kg (176 lb 12.9 oz)] 80.2 kg (176 lb 12.9 oz) (06/28 2050)  Intake/Output from previous day: 06/28 0701 - 06/29 0700 In: 3715.4 [P.O.:580; I.V.:2885.4; IV Piggyback:250] Out: 7124 [Urine:4695] Intake/Output this shift: Total I/O In: 600 [P.O.:600] Out: 1292 [Urine:1292]   Physical Exam  Constitutional: He is well-developed, well-nourished, and in no distress.  Cardiovascular: Normal rate and regular rhythm.   Pulmonary/Chest: Effort normal.  Abdominal: Soft. Bowel sounds are normal. He exhibits no mass.  Bladder not palpable  Musculoskeletal: He exhibits no edema.    Lab Results:   Recent Labs  07/11/13 0525 07/12/13 0355  WBC 7.8 7.1  HGB 8.7* 8.1*  HCT 25.1* 23.5*  PLT 100* 107*   BMET  Recent Labs  07/11/13 0525 07/12/13 0355  NA 134* 136*  K 3.7 3.8  CL 100 103  CO2 19 20  GLUCOSE 108* 103*  BUN 23 18  CREATININE 1.36* 1.31  CALCIUM 7.8* 8.0*   PT/INR No results found for this basename: LABPROT, INR,  in the last 72 hours ABG No results found for this basename: PHART, PCO2, PO2, HCO3,  in the last 72 hours  Studies/Results: No results found.  I have reviewed the hospital records and our office notes.   I discussed the case with Dr. Allyson Sabal.  Assessment: Prostate cancer with UTI and sepsis and some interrupted voiding with old clot. He is voiding well now and knows how to self cath if obstucted.   Plan: He is ready for discharge from a urologic standpoint. He has f/u in April with Dr. Diona Fanti.   CC: Dr. Reyne Dumas.     LOS: 2 days    Malka So 07/12/2013

## 2013-07-13 ENCOUNTER — Inpatient Hospital Stay (HOSPITAL_COMMUNITY): Payer: Medicare Other

## 2013-07-13 MED ORDER — CONDOMS MISC: Status: DC

## 2013-07-13 MED ORDER — GUAIFENESIN-CODEINE 100-10 MG/5ML PO SOLN: 5.0000 mL | Freq: Four times a day (QID) | ORAL | Status: DC

## 2013-07-13 MED ORDER — GUAIFENESIN-CODEINE 100-10 MG/5ML PO SOLN
5.0000 mL | Freq: Four times a day (QID) | ORAL | Status: DC
  Administered 2013-07-13: 5 mL via ORAL
  Filled 2013-07-13: qty 5

## 2013-07-13 NOTE — Discharge Summary (Signed)
Physician Discharge Summary  [name]  MRN: [id number]  DOB/AGE: [birth year] 78 y.o.  PCP: [name], MD  Admit date: 07/10/2013  Discharge date: 07/13/2013    Discharge Diagnoses:  :  PSEUDOMONAS AERUGINOSA UTI (lower urinary tract infection)  Active Problems:  ATRIAL FIBRILLATION-paroxysmal  Prostate cancer  Sepsis    Follow up recommendations  #1 repeat CBC in one week to monitor hemoglobin and platelet count  #2 repeat BMP in one week  #3 repeat UA after completion of antibiotics  #4 monitor HIT panel results    Medication List     STOP taking these medications       cephALEXin 500 MG capsule    Commonly known as: KEFLEX    ibuprofen 200 MG tablet    Commonly known as: ADVIL,MOTRIN     TAKE these medications       calcium citrate-vitamin D 315-200 MG-UNIT per tablet    Commonly known as: CITRACAL+D    Take 1 tablet by mouth 2 (two) times daily.    carbamide peroxide 6.5 % otic solution    Commonly known as: DEBROX    Place 5 drops into both ears 2 (two) times daily.    CEREFOLIN NAC 6-2-600 MG Tabs    Take 1 tablet by mouth daily.    ciprofloxacin 500 MG tablet    Commonly known as: CIPRO    Take 1 tablet (500 mg total) by mouth 2 (two) times daily.    docusate sodium 100 MG capsule    Commonly known as: COLACE    Take 1 capsule (100 mg total) by mouth every 12 (twelve) hours.    Fish Oil 500 MG Caps    Take 500 mg by mouth 2 (two) times daily.    GLUCOSAMINE 1500 COMPLEX Caps    Take 1 capsule by mouth 2 (two) times daily.    HYDROcodone-acetaminophen 5-325 MG per tablet    Commonly known as: NORCO/VICODIN    Take 1-2 tablets by mouth every 6 (six) hours as needed for moderate pain or severe pain.    levothyroxine 137 MCG tablet    Commonly known as: SYNTHROID, LEVOTHROID    Take 137 mcg by mouth daily before breakfast.    LUBRICATING EYE DROPS 0.5-0.9 % Soln    Generic drug: Carboxymethylcellul-Glycerin    Apply 1 drop to eye daily as  needed (dry eyes).    Melatonin 3 MG Tabs    Take 3 mg by mouth at bedtime.    multivitamin with minerals Tabs tablet    Take 1 tablet by mouth daily.    propranolol ER 60 MG 24 hr capsule    Commonly known as: INDERAL LA    Take 60 mg by mouth daily.    rivaroxaban 20 MG Tabs tablet    Commonly known as: XARELTO    Take 1 tablet (20 mg total) by mouth daily with supper.    solifenacin 10 MG tablet    Commonly known as: VESICARE    Take 10 mg by mouth daily.    tamsulosin 0.4 MG Caps capsule    Commonly known as: FLOMAX    Take 0.4 mg by mouth daily.      Discharge Condition: Stable  Disposition: 01-Home or Self Care  Consults: None*  Significant Diagnostic Studies:  Ct Abdomen Pelvis Wo Contrast  06/13/2013 CLINICAL DATA: Lower abdominal pain, urinary retention, recent bilateral renal stent placement. History of prostate cancer with brachytherapy seeds. EXAM: CT ABDOMEN AND  PELVIS WITHOUT CONTRAST TECHNIQUE: Multidetector CT imaging of the abdomen and pelvis was performed following the standard protocol without IV contrast. COMPARISON: 05/30/2013 FINDINGS: Trace left pleural effusion. Hypodense blood pool relative to myocardium, suggesting anemia. Pacemaker leads, incompletely visualized. Calcified hepatic and splenic granulomata. Pancreas and right adrenal gland are within normal limits. Minimal nodular thickening of the left adrenal gland, likely reflecting an 8 mm left adrenal adenoma (series 2/image 24). Gallbladder is unremarkable. No intrahepatic or extrahepatic ductal dilatation. Mild fullness of the bilateral renal collecting systems with indwelling double pigtail ureteral stents. Kidneys are otherwise unremarkable. No renal calculi. No evidence of bowel obstruction. Atherosclerotic calcifications of the abdominal aorta and branch vessels. No abdominopelvic ascites. No suspicious abdominopelvic lymphadenopathy. Brachytherapy seeds in the prostate. No ureteral or bladder calculi.  Bladder is moderately distended with an indwelling Foley catheter. Associated nondependent gas within the bladder. Suspected layering debris/hemorrhage within the bladder (series 2/image 35), although abnormal soft tissue is also possible. Mild degenerative changes of the visualized thoracolumbar spine, most prominent at L1-2. IMPRESSION: No renal, ureteral, or bladder calculi. Mild fullness of the bilateral renal collecting systems, improved, with indwelling double pigtail ureteral stents. Bladder is moderately distended with an indwelling Foley catheter. If catheter was not clamped prior to CT, correlate for catheter patency. Suspected layering debris/hemorrhage within the bladder, although abnormal soft tissue is also possible. Correlate with recent cystoscopy. Brachytherapy seeds in the prostate. No findings to suggest metastatic disease in the abdomen/pelvis. Electronically Signed By: Julian Hy M.D. On: 06/13/2013 13:28  Dg Chest Portable 1 View  07/10/2013 CLINICAL DATA: Sepsis EXAM: PORTABLE CHEST - 1 VIEW COMPARISON: 06/13/2013 FINDINGS: Stable orientation of a dual-chamber left approach pacer. No cardiomegaly. Unchanged mild aortic tortuosity. No consolidation, edema, effusion, or pneumothorax. There is minimal scarring or atelectasis at the peripheral left base. Chronic fragmentation of the lateral left clavicle. IMPRESSION: No active disease. Electronically Signed By: Jorje Guild M.D. On: 07/10/2013 07:03  Dg Abd Acute W/chest  06/13/2013 CLINICAL DATA: Lower left back pain for 2 days, bilateral ureteral stents placed 05/30/2013, gross hematuria. EXAM: ACUTE ABDOMEN SERIES (ABDOMEN 2 VIEW & CHEST 1 VIEW) COMPARISON: Multiple recent prior studies FINDINGS: Heart size and vascular pattern are normal. Lungs are clear. Cardiac pacer stable. Emphysematous change stable. No free air in the abdomen. Bilateral ureteral stents identified. Distal loop of right ureteral stent is mostly uncoiled.  Brachytherapy seeds are identified in the prostate. There is a nonobstructive bowel gas pattern. IMPRESSION: No acute cardiopulmonary abnormalities next item bilateral ureteral stents in anticipated position. Distal loop of right stent is mostly uncoiled. Electronically Signed By: Skipper Cliche M.D. On: 06/13/2013 11:12  Dg Abd Portable 1v  07/10/2013 CLINICAL DATA: Urinary tract infection EXAM: PORTABLE ABDOMEN - 1 VIEW COMPARISON: 06/13/2013 FINDINGS: Similar orientation of bilateral internal ureteral stents. The right lower limb is not formed, but is stable from previous. There is no visible stone along the course of the ureters or overlapping the renal shadows. The low pelvis is not imaged. No abnormal intra-abdominal mass effect or calcification. Clear lung bases. IMPRESSION: Unchanged positioning of bilateral internal ureteral stents. Electronically Signed By: Jorje Guild M.D. On: 07/10/2013 07:06  Microbiology:  Recent Results (from the past 240 hour(s))   URINE CULTURE Status: None    Collection Time    07/09/13 7:27 PM   Result  Value  Ref Range  Status    Specimen Description  URINE, RANDOM   Final    Special Requests  NONE   Final  Culture Setup Time    Final    Value:  07/10/2013 15:34     Performed at [facility]    Final    Value:  >=100,000 COLONIES/ML     Performed at [facility]    Culture    Final    Value:  PSEUDOMONAS AERUGINOSA     Performed at [facility]    Report Status  07/12/2013 FINAL   Final    Organism ID, Bacteria  PSEUDOMONAS AERUGINOSA   Final   URINE CULTURE Status: None    Collection Time    07/10/13 5:45 AM   Result  Value  Ref Range  Status    Specimen Description  URINE, RANDOM   Final    Special Requests  NONE   Final    Culture Setup Time    Final    Value:  07/10/2013 15:33     Performed at [facility]    Final    Value:  >=100,000 COLONIES/ML     Performed at [facility]    Culture    Final    Value:  PSEUDOMONAS AERUGINOSA     Performed at [facility]    Report Status  07/12/2013 FINAL   Final    Organism ID, Bacteria  PSEUDOMONAS AERUGINOSA   Final   CULTURE, BLOOD (ROUTINE X 2) Status: None    Collection Time    07/10/13 6:10 AM   Result  Value  Ref Range  Status    Specimen Description  BLOOD RIGHT FOREARM   Final    Special Requests  BOTTLES DRAWN AEROBIC AND ANAEROBIC 6CC   Final    Culture Setup Time    Final    Value:  07/10/2013 15:39     Performed at [facility]    Culture    Final    Value:  BLOOD CULTURE RECEIVED NO GROWTH TO DATE CULTURE WILL BE HELD FOR 5 DAYS BEFORE ISSUING A FINAL NEGATIVE REPORT     Performed at [facility]    Report Status  PENDING   Incomplete   CULTURE, BLOOD (ROUTINE X 2) Status: None    Collection Time    07/10/13 6:15 AM   Result  Value  Ref Range  Status    Specimen Description  BLOOD LEFT ARM   Final    Special Requests  BOTTLES DRAWN AEROBIC ONLY 6CC   Final    Culture Setup Time    Final    Value:  07/10/2013 15:39     Performed at [facility]    Culture    Final    Value:  BLOOD CULTURE RECEIVED NO GROWTH TO DATE CULTURE WILL BE HELD FOR 5 DAYS BEFORE ISSUING A FINAL NEGATIVE REPORT     Performed at [facility]    Report Status  PENDING   Incomplete    Labs:  Results for orders placed during the hospital encounter of 07/10/13 (from the past 48 hour(s))   BASIC METABOLIC PANEL Status: Abnormal    Collection Time    07/11/13 5:25 AM   Result  Value  Ref Range    Sodium  134 (*)  137 - 147 mEq/L    Potassium  3.7  3.7 - 5.3 mEq/L    Chloride  100  96 - 112 mEq/L    CO2  19  19 -  32 mEq/L    Glucose, Bld  108 (*)  70 - 99 mg/dL    BUN  23  6 - 23 mg/dL    Creatinine, Ser  1.36 (*)  0.50 - 1.35 mg/dL    Calcium  7.8 (*)  8.4 - 10.5 mg/dL    GFR calc non Af Amer  48 (*)  >90 mL/min    GFR calc Af Amer  55 (*)  >90 mL/min    Comment:  (NOTE)      The eGFR has been calculated using the CKD EPI equation.     This calculation has not been validated in all clinical situations.     eGFR's persistently <90 mL/min signify possible Chronic Kidney     Disease.   CBC Status: Abnormal    Collection Time    07/11/13 5:25 AM   Result  Value  Ref Range    WBC  7.8  4.0 - 10.5 K/uL    RBC  2.58 (*)  4.22 - 5.81 MIL/uL    Hemoglobin  8.7 (*)  13.0 - 17.0 g/dL    HCT  25.1 (*)  39.0 - 52.0 %    MCV  97.3  78.0 - 100.0 fL    MCH  33.7  26.0 - 34.0 pg    MCHC  34.7  30.0 - 36.0 g/dL    RDW  12.8  11.5 - 15.5 %    Platelets  100 (*)  150 - 400 K/uL    Comment:  REPEATED TO VERIFY     PLATELET COUNT CONFIRMED BY SMEAR   CBC Status: Abnormal    Collection Time    07/12/13 3:55 AM   Result  Value  Ref Range    WBC  7.1  4.0 - 10.5 K/uL    RBC  2.44 (*)  4.22 - 5.81 MIL/uL    Hemoglobin  8.1 (*)  13.0 - 17.0 g/dL    HCT  23.5 (*)  39.0 - 52.0 %    MCV  96.3  78.0 - 100.0 fL    MCH  33.2  26.0 - 34.0 pg    MCHC  34.5  30.0 - 36.0 g/dL    RDW  12.7  11.5 - 15.5 %    Platelets  107 (*)  150 - 400 K/uL    Comment:  CONSISTENT WITH PREVIOUS RESULT   COMPREHENSIVE METABOLIC PANEL Status: Abnormal    Collection Time    07/12/13 3:55 AM   Result  Value  Ref Range    Sodium  136 (*)  137 - 147 mEq/L    Potassium  3.8  3.7 - 5.3 mEq/L    Chloride  103  96 - 112 mEq/L    CO2  20  19 - 32 mEq/L    Glucose, Bld  103 (*)  70 - 99 mg/dL    BUN  18  6 - 23 mg/dL    Creatinine, Ser  1.31  0.50 - 1.35 mg/dL    Calcium  8.0 (*)  8.4 - 10.5 mg/dL    Total Protein  5.5 (*)  6.0 - 8.3 g/dL    Albumin  2.6 (*)  3.5 - 5.2 g/dL    AST  23  0 - 37 U/L    ALT  18  0 - 53 U/L    Alkaline Phosphatase  61  39 - 117 U/L    Total Bilirubin  0.3  0.3 - 1.2 mg/dL  GFR calc non Af Amer  50 (*)  >90 mL/min    GFR calc Af Amer  58 (*)  >90 mL/min    Comment:  (NOTE)     The eGFR has been calculated using the CKD EPI equation.     This calculation has not  been validated in all clinical situations.     eGFR's persistently <90 mL/min signify possible Chronic Kidney     Disease.   VANCOMYCIN, TROUGH Status: None    Collection Time    07/12/13 7:05 AM   Result  Value  Ref Range    Vancomycin Tr  15.1  10.0 - 20.0 ug/mL    HPI :*  80 year old male with past medical history significant for AFIB, prostate cancer s/p partial resection and seed implanting, presenting to the ED for fever. Pt states last night he started feeling very fatigued and chilled so he went to bed early. States woke up this morning and noticed that he felt warm, wife took temperature and was 101.54F so gave advil. States he is feeling somewhat better now. Patient is s/p bilateral ureteral stent placement for urinary obstruction (secondary to excess prostate tissue) by Dr. Tresa Moore and had an indwelling foley catheter that was removed on Wednesday, 07/07/13. Pt states he has not had much urine output over the past 2 days, mostly dribbles so did have to self cath earlier today. Pt is followed regularly by urology, Dr. Diona Fanti. Does endorse some dysuria but no noted hematuria or flank pain   HOSPITAL COURSE:  1. Pseudomonas UTI,SIRS, urine culture showed pansensitive Pseudomonas  Patient presented with borderline low blood pressure of systolic, fever of 338, initially started on vancomycin cefepime, subsequently narrowed down to oral ciprofloxacin after urine culture results were made available  No evidence of urinary retention, Foley catheter removed/24, Patient had excellent urine output during this admission. No evidence of hematuria either  Repeat UA to ensure resolution of infection in about 7-10 days   2. Prostate CA locally advanced prostate cancer. He is on androgen ablation and has required TUR and stents in May. He was seen last on 6/24 and was instructed to intermittently self cath , recent BL hydronephrosis, AKI, bladder mass s/p cystoscopy, BL ureteral stents, transurethral  resection of bladder tumor on 5/15  -bone scan (5/21): No evidence of skeletal metastasis  PVR on 6/29 was 80m, was noted to have interrupted voiding with old clot Seen by Dr WJeffie Pollockprior to DC  Patient will continue to follow up with outpatient urology ,Dr. DDiona Fanti  3. PAF -s/p abalation,s/p PPM , possibly sinus tachycardia overnight because of fever, Pt was on xarelto which was on hold since the last admission due to surgery"  -xarelto resumed prior to discharge because of stable hemoglobin and no evidence of any hematuria or bleeding   4. Anemia, chronic baseline of 9.5; no s/s of bleeding; hemoglobin stable   patient will need a repeat CBC and anemia workup in the outpatient setting  He is on xarelto, therefore will need close monitoring of her CBC. Platelet count decreased to 100k during this hospitalization. HIT panel pending. Primary care provider to followup on the results    5. Hypothyroidism continue Synthroid   6. Left ear pain-ear wax , started on DEBROX, ear lavage in PCP office   7. Thrombocytopenia,-discontinue heparin, Hit panel   8. Cough: initial CXR was negative , still has a cough ,started on robitussin , repeat CXR prior to DC   Code Status:  full   Discharge Exam:  Blood pressure 106/74, pulse 90, temperature 97.5 F (36.4 C), temperature source Oral, resp. rate 20, height 6' (1.829 m), weight 80.2 kg (176 lb 12.9 oz), SpO2 99.00%.  General: alert  Eyes: eom-i  ENT: no oral ulcers  Neck: supple  Cardiovascular: s1,s2 rrr  RespiratoryBL  Abdomen: soft, nt,nd  Skin: no rash  Musculoskeletal: no LE edema  Psychiatric: no hallucinations  Neurologic: CN 2-12 intact, motor 5/5 BL:       Discharge Instructions    Call MD for: persistant nausea and vomiting  Complete by: As directed       Call MD for: severe uncontrolled pain  Complete by: As directed       Call MD for: temperature >100.4  Complete by: As directed       Diet - low sodium heart healthy   Complete by: As directed       Increase activity slowly  Complete by: As directed            Follow-up Information    Follow up with [name], MD In 1 week.    Specialty: Internal Medicine    Contact information:    [address]  [place] [address]  [phone]       Follow up with Alexis Frock, MD In 2 weeks.    Specialty: Urology    Contact information:    [place] [id number]  [phone]

## 2013-07-13 NOTE — Progress Notes (Signed)
  Patient Discharge:  Disposition: discharge to home with [facility]  Education: educated on discharge instructions, follow-up appointments, and medications.  Patient and wife verbalized understanding.  Education material on UTI given.  AVS signed.    IV: removed  Telemetry: removed, CCMD notified, box returned   Follow-up appointments: appointments scheduled by secretary, appointment times given to patient  Prescriptions:  Patient given hard copy of prescriptions   Transportation:  Patient escorted to car in wheelchair with nurse extern  Belongings: gathered by patient and nurse extern

## 2013-07-13 NOTE — Clinical Documentation Improvement (Signed)
Presents with Pseudomonas UTI and r/o Sepsis documented.   I've read your discharge summary and under hospital course, you note the UTI without sepsis being mentioned. However, looking under the problem list, Sepsis is documented and can be coded from.  In order to code the chart accurately, please clarify if you feel the sepsis: Ruled in                    Ruled out  Thank You, Zoila Shutter ,RN Clinical Documentation Specialist:  [place] [facility]

## 2013-07-13 NOTE — Care Management Note (Addendum)
CARE MANAGEMENT NOTE 07/13/2013  Patient:  [name], [name]   Account Number:  [id number]  Date Initiated:  07/12/2013  Documentation initiated by:  Ryheem Jay  Subjective/Objective Assessment:   Referral for [facility] needs. DME ordered.     Action/Plan:   Met with pt and wife re HH , DME to be delivered to pt room.  07/13/2013 Pt now agreeing to [facility] declining 3:1.   Anticipated DC Date:  07/13/2013   Anticipated DC Plan:  [place]         [facility] Choice  HOME HEALTH   Choice offered to / List presented to:  C-1 Patient   DME arranged  Vassie Moselle      DME agency  [place] arranged  HH-1 RN  HH-2 PT      [facility] agency  [facility]   Status of service:  Completed, signed off Medicare Important Message given?  YES (If response is "NO", the following Medicare IM given date fields will be blank) Date Medicare IM given:  07/13/2013 Medicare IM given by:  Jasmine Pang RN MPH, case manager Date Additional Medicare IM given:   Additional Medicare IM given by:    Discharge Disposition:    Per UR Regulation:    If discussed at Long Length of Stay Meetings, dates discussed:    Comments:   07/13/2013 Pt now wishes to have [facility] services and selected [facility] for [facility] and HHPT, declined HHOT and now declining 3:1 for home use, will ask [facility] to pick this up and HHPT will eval pt in the home for needs. CRoyal RN MPH, case manager, [phone]   07/12/2013 Met with pt and wife, wife very anxious re d/c , needs assistance in the home to assist the pt . Info on home care agencies provided to pt and wife as they intend to pursue [facility] aide services. Both pt and wife declined HHPT and [place] services. DME delivered to pt room. Both instructed to contact pt PCP , Dr Felipa Eth, if they determine after d/c that HHPT and/or [place] are needed. Jasmine Pang RN MPH, case manager [phone]

## 2013-07-14 LAB — HEPARIN INDUCED THROMBOCYTOPENIA PNL
Heparin Induced Plt Ab: NEGATIVE
Patient O.D.: 0.151
UFH High Dose UFH H: 0 % Release
UFH Low Dose 0.1 IU/mL: 0 % Release
UFH Low Dose 0.5 IU/mL: 0 % Release
UFH SRA RESULT: NEGATIVE

## 2013-07-15 ENCOUNTER — Encounter: Payer: Self-pay | Admitting: Internal Medicine

## 2013-07-15 ENCOUNTER — Ambulatory Visit (INDEPENDENT_AMBULATORY_CARE_PROVIDER_SITE_OTHER): Payer: Medicare Other | Admitting: Internal Medicine

## 2013-07-15 VITALS — BP 114/75 | HR 73 | Ht 72.0 in | Wt 171.0 lb

## 2013-07-15 DIAGNOSIS — I442 Atrioventricular block, complete: Secondary | ICD-10-CM

## 2013-07-15 DIAGNOSIS — I4891 Unspecified atrial fibrillation: Secondary | ICD-10-CM

## 2013-07-15 DIAGNOSIS — I480 Paroxysmal atrial fibrillation: Secondary | ICD-10-CM

## 2013-07-15 DIAGNOSIS — Z950 Presence of cardiac pacemaker: Secondary | ICD-10-CM

## 2013-07-15 LAB — MDC_IDC_ENUM_SESS_TYPE_INCLINIC
Implantable Pulse Generator Model: 1297
Lead Channel Impedance Value: 340 Ohm
Lead Channel Sensing Intrinsic Amplitude: 1.5 mV
Lead Channel Setting Pacing Amplitude: 2 V
Lead Channel Setting Pacing Amplitude: 2.4 V
Lead Channel Setting Pacing Pulse Width: 0.4 ms
MDC IDC MSMT LEADCHNL RV IMPEDANCE VALUE: 420 Ohm
MDC IDC MSMT LEADCHNL RV PACING THRESHOLD AMPLITUDE: 0.8 V
MDC IDC MSMT LEADCHNL RV PACING THRESHOLD PULSEWIDTH: 0.4 ms
MDC IDC MSMT LEADCHNL RV SENSING INTR AMPL: 11.1 mV
MDC IDC PG SERIAL: 319544

## 2013-07-15 NOTE — Progress Notes (Signed)
Patient Care Team: Lajean Manes, MD as PCP - General (Internal Medicine)   HPI  [name] is a 80 y.o. male  Seen in followup for atrial fibrillation for which he was placed on flecainide. He was seen a few weeks ago and was found to have a broad complex rhythm that was concerning for flecainide toxicity. There was an atrial tachycardia that Dr. Lovena Le successfully pace terminated.  It was elected to continue his flecainide. The device was reprogrammed to detect slower arrhythmias. The flecainide dose was decreased from 100-50 twice a day  He has no awareness of intercurrent atrial fibrillation. He notes that fatigue is chronic but no different. He is taking Rivaroxaban    Past Medical History  Diagnosis Date  . Bradycardia     s/p PPM  . Atrial flutter     s/p ablation  . Atrial fibrillation   . Prostate cancer 12-21-2003    12-7-seed implants done  . Hematoma   . Mitral regurgitation     mild echo 2012  . Pacemaker --[employer]   . Orthostatic lightheadedness   . Subdural hematoma   . Anemia due to GI blood loss   . Prostate cancer     Past Surgical History  Procedure Laterality Date  . Tonsillectomy  as child  . Arthrscopic left knee surgery  02-11-2007  . Cardioversion  09-04-2006  . Av node ablation  04-06-2007  . Subdural hematoma evacuation via craniotomy  12-16-2007  . Rotator cuff repair w/ distal clavicle excision  08-31-2010    left  . Pacemaker insertion      Guidant Insignia   . Total knee arthroplasty  12/18/2010    Procedure: TOTAL KNEE ARTHROPLASTY;  Surgeon: Mauri Pole;  Location: [facility] ORS;  Service: Orthopedics;  Laterality: Left;  . Joint replacement    . Brain surgery    . Cardioversion    . Cataract extraction    . Left knee arthroscopy    . Cystoscopy w/ ureteral stent placement Bilateral 05/30/2013    Procedure: CYSTOSCOPY WITH RETROGRADE PYELOGRAM/ BILATERAL URETERAL STENT PLACEMENT, TRANSURETRAL RESECTION OF BLADDER TUMOR  WITH GYRUS;  Surgeon: Alexis Frock, MD;  Location: [facility]S;  Service: Urology;  Laterality: Bilateral;    Current Outpatient Prescriptions  Medication Sig Dispense Refill  . calcium citrate-vitamin D (CITRACAL+D) 315-200 MG-UNIT per tablet Take 1 tablet by mouth 2 (two) times daily.      . carbamide peroxide (DEBROX) 6.5 % otic solution Place 5 drops into both ears 2 (two) times daily.  15 mL  0  . Carboxymethylcellul-Glycerin (LUBRICATING EYE DROPS) 0.5-0.9 % SOLN Apply 1 drop to eye daily as needed (dry eyes).       . ciprofloxacin (CIPRO) 500 MG tablet Take 1 tablet (500 mg total) by mouth 2 (two) times daily.  20 tablet  0  . docusate sodium (COLACE) 100 MG capsule Take 1 capsule (100 mg total) by mouth every 12 (twelve) hours.  60 capsule  0  . Glucosamine-Chondroit-Vit C-Mn (GLUCOSAMINE 1500 COMPLEX) CAPS Take 1 capsule by mouth 2 (two) times daily.      Marland Kitchen guaiFENesin-codeine 100-10 MG/5ML syrup Take 5 mLs by mouth 4 (four) times daily.  120 mL  0  . levothyroxine (SYNTHROID, LEVOTHROID) 137 MCG tablet Take 137 mcg by mouth daily before breakfast.      . Melatonin 3 MG TABS Take 3 mg by mouth at bedtime.       . Methylfol-Methylcob-Acetylcyst (  CEREFOLIN NAC) 6-2-600 MG TABS Take 1 tablet by mouth daily.      . Multiple Vitamin (MULTIVITAMIN WITH MINERALS) TABS tablet Take 1 tablet by mouth daily.      . Omega-3 Fatty Acids (FISH OIL) 500 MG CAPS Take 500 mg by mouth 2 (two) times daily.      . propranolol ER (INDERAL LA) 60 MG 24 hr capsule Take 60 mg by mouth daily.      . rivaroxaban (XARELTO) 20 MG TABS tablet Take 1 tablet (20 mg total) by mouth daily with supper.  30 tablet  2  . solifenacin (VESICARE) 10 MG tablet Take 10 mg by mouth daily.      . tamsulosin (FLOMAX) 0.4 MG CAPS capsule Take 0.4 mg by mouth daily.       No current facility-administered medications for this visit.    No Known Allergies  Review of Systems negative except from HPI and PMH  Physical Exam BP  114/75  Pulse 73  Ht 6' (1.829 m)  Wt 171 lb (77.565 kg)  BMI 23.19 kg/m2 Well developed and well nourished in no acute distress HENT normal E scleral and icterus clear Neck Supple JVP flat; carotids brisk and full Clear to ausculation  Regular rate and rhythm, no murmurs gallops or rub Soft with active bowel sounds No clubbing cyanosis  Edema Alert and oriented, grossly normal motor and sensory function Skin Warm and Dry    Assessment and  Plan  Atrial fibrillaton  Bradycardia   Pacemaker [employer] The patient's device was interrogated.  The information was reviewed. No changes were made in the programming.     Subdural hematoma  With recurrent and persistent atrial fibrillation will continue anticoagulation and anticipate cardioversion in 3 weeks  We will plan to have him come in the day before for  ECG to clarify his rhythm    At that juncture we will determine whether he has symptoms related to his atrial fibrillation are not.

## 2013-07-15 NOTE — Patient Instructions (Signed)
Your physician recommends that you continue on your current medications as directed. Please refer to the Current Medication list given to you today.  Your physician has recommended that you have a Cardioversion (DCCV). Electrical Cardioversion uses a jolt of electricity to your heart either through paddles or wired patches attached to your chest. This is a controlled, usually prescheduled, procedure. Defibrillation is done under light anesthesia in the hospital, and you usually go home the day of the procedure. This is done to get your heart back into a normal rhythm. You are not awake for the procedure. Please see the instruction sheet given to you today.   [name] will call you next week to schedule this.  You will need an EKG the day before cardioversion.

## 2013-07-16 LAB — CULTURE, BLOOD (ROUTINE X 2)
CULTURE: NO GROWTH
Culture: NO GROWTH

## 2013-07-20 ENCOUNTER — Other Ambulatory Visit: Payer: Self-pay | Admitting: *Deleted

## 2013-07-20 ENCOUNTER — Encounter: Payer: Self-pay | Admitting: *Deleted

## 2013-07-20 ENCOUNTER — Telehealth: Payer: Self-pay | Admitting: *Deleted

## 2013-07-20 DIAGNOSIS — Z01812 Encounter for preprocedural laboratory examination: Secondary | ICD-10-CM

## 2013-07-20 DIAGNOSIS — I4819 Other persistent atrial fibrillation: Secondary | ICD-10-CM

## 2013-07-20 NOTE — Telephone Encounter (Signed)
Pt not at home. Spoke with wife and scheduled DCCV for 7/22.   Pre procedure labs 7/17.  EKG 7/21, prior to procedure. Reviewed instructions with pt's wife and left letter at front desk for pt pick up. Patient's wife verbalized understanding and agreeable to plan.

## 2013-07-21 ENCOUNTER — Telehealth: Payer: Self-pay | Admitting: Hematology and Oncology

## 2013-07-21 NOTE — Telephone Encounter (Signed)
LEFT MESSAGE FOR PATIENT AND GAVE NP APPT FOR 07/17 @ 1:45 W/DR. [place].  REFERRING DR. HAL STONEKING DX- PERSISTENT ANEMIA

## 2013-07-26 ENCOUNTER — Encounter (HOSPITAL_COMMUNITY): Payer: Self-pay | Admitting: Pharmacy Technician

## 2013-07-30 ENCOUNTER — Ambulatory Visit: Payer: Medicare Other | Admitting: Hematology and Oncology

## 2013-07-30 ENCOUNTER — Ambulatory Visit: Payer: Medicare Other

## 2013-07-30 ENCOUNTER — Other Ambulatory Visit (INDEPENDENT_AMBULATORY_CARE_PROVIDER_SITE_OTHER): Payer: Medicare Other

## 2013-07-30 DIAGNOSIS — I4819 Other persistent atrial fibrillation: Secondary | ICD-10-CM

## 2013-07-30 DIAGNOSIS — I4891 Unspecified atrial fibrillation: Secondary | ICD-10-CM

## 2013-07-30 DIAGNOSIS — Z01812 Encounter for preprocedural laboratory examination: Secondary | ICD-10-CM

## 2013-07-30 LAB — BASIC METABOLIC PANEL
BUN: 31 mg/dL — ABNORMAL HIGH (ref 6–23)
CHLORIDE: 106 meq/L (ref 96–112)
CO2: 28 mEq/L (ref 19–32)
Calcium: 9.4 mg/dL (ref 8.4–10.5)
Creatinine, Ser: 1.4 mg/dL (ref 0.4–1.5)
GFR: 53.13 mL/min — ABNORMAL LOW (ref >60.00)
Glucose, Bld: 79 mg/dL (ref 70–99)
POTASSIUM: 4.7 meq/L (ref 3.5–5.1)
Sodium: 138 mEq/L (ref 135–145)

## 2013-07-30 LAB — CBC WITH DIFFERENTIAL/PLATELET
BASOS PCT: 0.6 % (ref 0.0–3.0)
Basophils Absolute: 0 10*3/uL (ref 0.0–0.1)
EOS PCT: 3.3 % (ref 0.0–5.0)
Eosinophils Absolute: 0.2 10*3/uL (ref 0.0–0.7)
HEMATOCRIT: 31.3 % — AB (ref 39.0–52.0)
HEMOGLOBIN: 10.7 g/dL — AB (ref 13.0–17.0)
Lymphocytes Relative: 20.9 % (ref 12.0–46.0)
Lymphs Abs: 1.1 10*3/uL (ref 0.7–4.0)
MCHC: 34.2 g/dL (ref 30.0–36.0)
MCV: 96.5 fl (ref 78.0–100.0)
Monocytes Absolute: 0.4 10*3/uL (ref 0.1–1.0)
Monocytes Relative: 7.7 % (ref 3.0–12.0)
NEUTROS ABS: 3.5 10*3/uL (ref 1.4–7.7)
Neutrophils Relative %: 67.5 % (ref 43.0–77.0)
Platelets: 199 10*3/uL (ref 150.0–400.0)
RBC: 3.24 Mil/uL — AB (ref 4.22–5.81)
RDW: 13.1 % (ref 11.5–15.5)
WBC: 5.2 10*3/uL (ref 4.0–10.5)

## 2013-08-03 ENCOUNTER — Ambulatory Visit (INDEPENDENT_AMBULATORY_CARE_PROVIDER_SITE_OTHER): Payer: Medicare Other | Admitting: *Deleted

## 2013-08-03 ENCOUNTER — Encounter (INDEPENDENT_AMBULATORY_CARE_PROVIDER_SITE_OTHER): Payer: Self-pay

## 2013-08-03 VITALS — BP 106/70 | HR 79 | Ht 72.0 in | Wt 161.5 lb

## 2013-08-03 DIAGNOSIS — I4819 Other persistent atrial fibrillation: Secondary | ICD-10-CM

## 2013-08-03 DIAGNOSIS — I4891 Unspecified atrial fibrillation: Secondary | ICD-10-CM

## 2013-08-03 NOTE — Addendum Note (Signed)
Addended by: Katrine Coho on: 08/03/2013 10:38 AM   Modules accepted: Orders

## 2013-08-03 NOTE — Progress Notes (Signed)
1.) Reason for visit: here for EKG prior to cardioversion 08/04/13.  2.) Name of MD requesting visit: Dr Jolyn Nap  3.) H&P: Atrial fibrillation, no complaints.   4.) ROS related to problem: no complaints.                Pt confirms he has not missed any doses of Xarelto.                      Pt's weight is down almost 10 pounds in about 3 weeks.                      Pt states his appetite is good and his PCP is aware of weight loss.   5.) Assessment and plan per MD:                  Reviewed EKG with Dr Fransico Him            Pt remains in atrial fibrillation            Plan DCCV as scheduled 08/04/13

## 2013-08-04 ENCOUNTER — Encounter (HOSPITAL_COMMUNITY)
Admission: RE | Disposition: A | Discharge status: Home / Self Care | Payer: Self-pay | Source: Ambulatory Visit | Attending: Cardiovascular Disease

## 2013-08-04 ENCOUNTER — Encounter (HOSPITAL_COMMUNITY): Payer: Medicare Other | Admitting: Anesthesiology

## 2013-08-04 ENCOUNTER — Ambulatory Visit (HOSPITAL_COMMUNITY): Payer: Medicare Other | Admitting: Anesthesiology

## 2013-08-04 ENCOUNTER — Ambulatory Visit (HOSPITAL_COMMUNITY)
Admission: RE | Admit: 2013-08-04 | Discharge: 2013-08-04 | Disposition: A | Discharge status: Home / Self Care | Payer: Medicare Other | Source: Ambulatory Visit | Attending: Cardiovascular Disease | Admitting: Cardiovascular Disease

## 2013-08-04 ENCOUNTER — Encounter (HOSPITAL_COMMUNITY): Payer: Self-pay | Admitting: *Deleted

## 2013-08-04 DIAGNOSIS — Z950 Presence of cardiac pacemaker: Secondary | ICD-10-CM | POA: Insufficient documentation

## 2013-08-04 DIAGNOSIS — I498 Other specified cardiac arrhythmias: Secondary | ICD-10-CM | POA: Diagnosis not present

## 2013-08-04 DIAGNOSIS — I4819 Other persistent atrial fibrillation: Secondary | ICD-10-CM

## 2013-08-04 DIAGNOSIS — I480 Paroxysmal atrial fibrillation: Secondary | ICD-10-CM

## 2013-08-04 DIAGNOSIS — Z8546 Personal history of malignant neoplasm of prostate: Secondary | ICD-10-CM | POA: Insufficient documentation

## 2013-08-04 DIAGNOSIS — D649 Anemia, unspecified: Secondary | ICD-10-CM | POA: Insufficient documentation

## 2013-08-04 DIAGNOSIS — Z96659 Presence of unspecified artificial knee joint: Secondary | ICD-10-CM | POA: Insufficient documentation

## 2013-08-04 DIAGNOSIS — Z7901 Long term (current) use of anticoagulants: Secondary | ICD-10-CM | POA: Insufficient documentation

## 2013-08-04 DIAGNOSIS — I4891 Unspecified atrial fibrillation: Secondary | ICD-10-CM | POA: Diagnosis not present

## 2013-08-04 DIAGNOSIS — N289 Disorder of kidney and ureter, unspecified: Secondary | ICD-10-CM | POA: Diagnosis not present

## 2013-08-04 DIAGNOSIS — I10 Essential (primary) hypertension: Secondary | ICD-10-CM | POA: Diagnosis not present

## 2013-08-04 DIAGNOSIS — I442 Atrioventricular block, complete: Secondary | ICD-10-CM

## 2013-08-04 HISTORY — PX: CARDIOVERSION: SHX1299

## 2013-08-04 SURGERY — CARDIOVERSION
Anesthesia: General

## 2013-08-04 MED ORDER — PROPOFOL 10 MG/ML IV BOLUS
INTRAVENOUS | Status: DC | PRN
  Administered 2013-08-04: 60 mg via INTRAVENOUS
  Administered 2013-08-04: 40 mg via INTRAVENOUS

## 2013-08-04 MED ORDER — SODIUM CHLORIDE 0.9 % IV SOLN
INTRAVENOUS | Status: DC
  Administered 2013-08-04: 12:00:00 via INTRAVENOUS

## 2013-08-04 MED ORDER — LIDOCAINE HCL (CARDIAC) 20 MG/ML IV SOLN
INTRAVENOUS | Status: DC | PRN
  Administered 2013-08-04: 20 mg via INTRAVENOUS

## 2013-08-04 NOTE — Transfer of Care (Signed)
Immediate Anesthesia Transfer of Care Note  Patient: [name]  Procedure(s) Performed: Procedure(s): CARDIOVERSION (N/A)  Patient Location: Endoscopy Unit  Anesthesia Type:General  Level of Consciousness: awake, alert , oriented and patient cooperative  Airway & Oxygen Therapy: Patient Spontanous Breathing and Patient connected to nasal cannula oxygen  Post-op Assessment: Report given to PACU RN, Post -op Vital signs reviewed and stable and Patient moving all extremities  Post vital signs: Reviewed and stable  Complications: No apparent anesthesia complications

## 2013-08-04 NOTE — Anesthesia Preprocedure Evaluation (Addendum)
Anesthesia Evaluation  Patient identified by MRN, date of birth, ID band Patient awake    Reviewed: Allergy & Precautions, H&P , NPO status , Patient's Chart, lab work & pertinent test results, reviewed documented beta blocker date and time   History of Anesthesia Complications Negative for: history of anesthetic complications  Airway Mallampati: I TM Distance: >3 FB Neck ROM: Full    Dental  (+) Dental Advisory Given   Pulmonary neg pulmonary ROS,  breath sounds clear to auscultation  Pulmonary exam normal       Cardiovascular hypertension, Pt. on medications and Pt. on home beta blockers + dysrhythmias Atrial Fibrillation + pacemaker Rhythm:Irregular Rate:Normal  5/15 ECHO: EF 65%, valves OK   Neuro/Psych  Headaches, S/p SDH: craniotomy    GI/Hepatic negative GI ROS, Neg liver ROS,   Endo/Other  negative endocrine ROS  Renal/GU Renal InsufficiencyRenal disease (creat 1.4)     Musculoskeletal   Abdominal   Peds  Hematology  (+) Blood dyscrasia (Hb 10.7), anemia ,   Anesthesia Other Findings   Reproductive/Obstetrics                        Anesthesia Physical Anesthesia Plan  ASA: III  Anesthesia Plan: General   Post-op Pain Management:    Induction: Intravenous  Airway Management Planned: Mask  Additional Equipment:   Intra-op Plan:   Post-operative Plan:   Informed Consent: I have reviewed the patients History and Physical, chart, labs and discussed the procedure including the risks, benefits and alternatives for the proposed anesthesia with the patient or authorized representative who has indicated his/her understanding and acceptance.   Dental advisory given  Plan Discussed with: CRNA, Anesthesiologist and Surgeon  Anesthesia Plan Comments: (Plan routine monitors, GA for cardioversion)       Anesthesia Quick Evaluation

## 2013-08-04 NOTE — Anesthesia Postprocedure Evaluation (Signed)
  Anesthesia Post-op Note  Patient: [name]  Procedure(s) Performed: Procedure(s): CARDIOVERSION (N/A)  Patient Location: Endoscopy Unit  Anesthesia Type:General  Level of Consciousness: awake, alert , oriented and patient cooperative  Airway and Oxygen Therapy: Patient Spontanous Breathing and Patient connected to nasal cannula oxygen  Post-op Pain: none  Post-op Assessment: Post-op Vital signs reviewed, Patient's Cardiovascular Status Stable, Respiratory Function Stable, Patent Airway, No signs of Nausea or vomiting and Pain level controlled  Post-op Vital Signs: Reviewed and stable  Last Vitals:  Filed Vitals:   08/04/13 1510  BP:   Pulse:   Resp: 16    Complications: No apparent anesthesia complications

## 2013-08-04 NOTE — Interval H&P Note (Signed)
History and Physical Interval Note:  08/04/2013 1:46 PM  [name]  has presented today for surgery, with the diagnosis of AFIB  The various methods of treatment have been discussed with the patient and family. After consideration of risks, benefits and other options for treatment, the patient has consented to  Procedure(s): CARDIOVERSION (N/A) as a surgical intervention .  The patient's history has been reviewed, patient examined, no change in status, stable for surgery.  I have reviewed the patient's chart and labs.  Questions were answered to the patient's satisfaction.     Jin Capote

## 2013-08-04 NOTE — H&P (View-Only) (Signed)
Patient Care Team: Lajean Manes, MD as PCP - General (Internal Medicine)   HPI  [name] is a 80 y.o. male  Seen in followup for atrial fibrillation for which he was placed on flecainide. He was seen a few weeks ago and was found to have a broad complex rhythm that was concerning for flecainide toxicity. There was an atrial tachycardia that Dr. Lovena Le successfully pace terminated.  It was elected to continue his flecainide. The device was reprogrammed to detect slower arrhythmias. The flecainide dose was decreased from 100-50 twice a day  He has no awareness of intercurrent atrial fibrillation. He notes that fatigue is chronic but no different. He is taking Rivaroxaban    Past Medical History  Diagnosis Date  . Bradycardia     s/p PPM  . Atrial flutter     s/p ablation  . Atrial fibrillation   . Prostate cancer 12-21-2003    12-7-seed implants done  . Hematoma   . Mitral regurgitation     mild echo 2012  . Pacemaker --[employer]   . Orthostatic lightheadedness   . Subdural hematoma   . Anemia due to GI blood loss   . Prostate cancer     Past Surgical History  Procedure Laterality Date  . Tonsillectomy  as child  . Arthrscopic left knee surgery  02-11-2007  . Cardioversion  09-04-2006  . Av node ablation  04-06-2007  . Subdural hematoma evacuation via craniotomy  12-16-2007  . Rotator cuff repair w/ distal clavicle excision  08-31-2010    left  . Pacemaker insertion      Guidant Insignia   . Total knee arthroplasty  12/18/2010    Procedure: TOTAL KNEE ARTHROPLASTY;  Surgeon: Mauri Pole;  Location: [facility];  Service: Orthopedics;  Laterality: Left;  . Joint replacement    . Brain surgery    . Cardioversion    . Cataract extraction    . Left knee arthroscopy    . Cystoscopy w/ ureteral stent placement Bilateral 05/30/2013    Procedure: CYSTOSCOPY WITH RETROGRADE PYELOGRAM/ BILATERAL URETERAL STENT PLACEMENT, TRANSURETRAL RESECTION OF BLADDER TUMOR  WITH GYRUS;  Surgeon: Alexis Frock, MD;  Location: [facility];  Service: Urology;  Laterality: Bilateral;    Current Outpatient Prescriptions  Medication Sig Dispense Refill  . calcium citrate-vitamin D (CITRACAL+D) 315-200 MG-UNIT per tablet Take 1 tablet by mouth 2 (two) times daily.      . carbamide peroxide (DEBROX) 6.5 % otic solution Place 5 drops into both ears 2 (two) times daily.  15 mL  0  . Carboxymethylcellul-Glycerin (LUBRICATING EYE DROPS) 0.5-0.9 % SOLN Apply 1 drop to eye daily as needed (dry eyes).       . ciprofloxacin (CIPRO) 500 MG tablet Take 1 tablet (500 mg total) by mouth 2 (two) times daily.  20 tablet  0  . docusate sodium (COLACE) 100 MG capsule Take 1 capsule (100 mg total) by mouth every 12 (twelve) hours.  60 capsule  0  . Glucosamine-Chondroit-Vit C-Mn (GLUCOSAMINE 1500 COMPLEX) CAPS Take 1 capsule by mouth 2 (two) times daily.      Marland Kitchen guaiFENesin-codeine 100-10 MG/5ML syrup Take 5 mLs by mouth 4 (four) times daily.  120 mL  0  . levothyroxine (SYNTHROID, LEVOTHROID) 137 MCG tablet Take 137 mcg by mouth daily before breakfast.      . Melatonin 3 MG TABS Take 3 mg by mouth at bedtime.       . Methylfol-Methylcob-Acetylcyst (  CEREFOLIN NAC) 6-2-600 MG TABS Take 1 tablet by mouth daily.      . Multiple Vitamin (MULTIVITAMIN WITH MINERALS) TABS tablet Take 1 tablet by mouth daily.      . Omega-3 Fatty Acids (FISH OIL) 500 MG CAPS Take 500 mg by mouth 2 (two) times daily.      . propranolol ER (INDERAL LA) 60 MG 24 hr capsule Take 60 mg by mouth daily.      . rivaroxaban (XARELTO) 20 MG TABS tablet Take 1 tablet (20 mg total) by mouth daily with supper.  30 tablet  2  . solifenacin (VESICARE) 10 MG tablet Take 10 mg by mouth daily.      . tamsulosin (FLOMAX) 0.4 MG CAPS capsule Take 0.4 mg by mouth daily.       No current facility-administered medications for this visit.    No Known Allergies  Review of Systems negative except from HPI and PMH  Physical Exam BP  114/75  Pulse 73  Ht 6' (1.829 m)  Wt 171 lb (77.565 kg)  BMI 23.19 kg/m2 Well developed and well nourished in no acute distress HENT normal E scleral and icterus clear Neck Supple JVP flat; carotids brisk and full Clear to ausculation  Regular rate and rhythm, no murmurs gallops or rub Soft with active bowel sounds No clubbing cyanosis  Edema Alert and oriented, grossly normal motor and sensory function Skin Warm and Dry    Assessment and  Plan  Atrial fibrillaton  Bradycardia   Pacemaker [employer] The patient's device was interrogated.  The information was reviewed. No changes were made in the programming.     Subdural hematoma  With recurrent and persistent atrial fibrillation will continue anticoagulation and anticipate cardioversion in 3 weeks  We will plan to have him come in the day before for  ECG to clarify his rhythm    At that juncture we will determine whether he has symptoms related to his atrial fibrillation are not.

## 2013-08-04 NOTE — Op Note (Signed)
Procedure: Electrical Cardioversion Indications:  Atrial Fibrillation  Procedure Details:  Consent: Risks of procedure as well as the alternatives and risks of each were explained to the (patient/caregiver).  Consent for procedure obtained.  Time Out: Verified patient identification, verified procedure, site/side was marked, verified correct patient position, special equipment/implants available, medications/allergies/relevent history reviewed, required imaging and test results available.  Performed  Patient placed on cardiac monitor, pulse oximetry, supplemental oxygen as necessary.  Sedation given: 100 mg IV propofol, Dr. Glennon Mac Pacer pads placed anterior and posterior chest.  Cardioverted 1 time(s).  Cardioverted at 120J.  Evaluation: Findings: Post procedure EKG shows: AV sequential paced rhythm Complications: None. Post shock pacemaker check shows normal function. Patient did tolerate procedure well.  Time Spent Directly with the Patient:  60 minutes   [name] 08/04/2013, 2:36 PM

## 2013-08-04 NOTE — Discharge Instructions (Signed)
Monitored Anesthesia Care  Monitored anesthesia care is an anesthesia service for a medical procedure. Anesthesia is the loss of the ability to feel pain. It is produced by medications called anesthetics. It may affect a small area of your body (local anesthesia), a large area of your body (regional anesthesia), or your entire body (general anesthesia). The need for monitored anesthesia care depends your procedure, your condition, and the potential need for regional or general anesthesia. It is often provided during procedures where:   General anesthesia may be needed if there are complications. This is because you need special care when you are under general anesthesia.   You will be under local or regional anesthesia. This is so that you are able to have higher levels of anesthesia if needed.   You will receive calming medications (sedatives). This is especially the case if sedatives are given to put you in a semi-conscious state of relaxation (deep sedation). This is because the amount of sedative needed to produce this state can be hard to predict. Too much of a sedative can produce general anesthesia. Monitored anesthesia care is performed by one or more caregivers who have special training in all types of anesthesia. You will need to meet with these caregivers before your procedure. During this meeting, they will ask you about your medical history. They will also give you instructions to follow. (For example, you will need to stop eating and drinking before your procedure. You may also need to stop or change medications you are taking.) During your procedure, your caregivers will stay with you. They will:   Watch your condition. This includes watching you blood pressure, breathing, and level of pain.   Diagnose and treat problems that occur.   Give medications if they are needed. These may include calming medications (sedatives) and anesthetics.   Make sure you are comfortable.  Having  monitored anesthesia care does not necessarily mean that you will be under anesthesia. It does mean that your caregivers will be able to manage anesthesia if you need it or if it occurs. It also means that you will be able to have a different type of anesthesia than you are having if you need it. When your procedure is complete, your caregivers will continue to watch your condition. They will make sure any medications wear off before you are allowed to go home.  Document Released: 09/26/2004 Document Revised: 04/27/2012 Document Reviewed: 02/12/2012 [facility] Patient Information 2015 [place], [place]. This information is not intended to replace advice given to you by your health care provider. Make sure you discuss any questions you have with your health care provider. Electrical Cardioversion Electrical cardioversion is the delivery of a jolt of electricity to change the rhythm of the heart. Sticky patches or metal paddles are placed on the chest to deliver the electricity from a device. This is done to restore a normal rhythm. A rhythm that is too fast or not regular keeps the heart from pumping well. Electrical cardioversion is done in an emergency if:   There is low or no blood pressure as a result of the heart rhythm.   Normal rhythm must be restored as fast as possible to protect the brain and heart from further damage.   It may save a life. Cardioversion may be done for heart rhythms that are not immediately life-threatening, such as atrial fibrillation or flutter, in which:   The heart is beating too fast or is not regular.   Medicine to change the rhythm  has not worked.   It is safe to wait in order to allow time for preparation.  Symptoms of the abnormal rhythm are bothersome.  The risk of stroke and other serious complications can be reduced. LET YOUR CAREGIVER KNOW ABOUT:   All medicines you are taking, including vitamins, herbs, eye drops, creams, and over-the-counter  medicines.   Previous problems you or members of your family have had with the use of anesthetics.   Any blood disorders you have.   Previous surgeries you have had.   Medical conditions you have. RISKS AND COMPLICATIONS  Generally, this is a safe procedure. However, as with any procedure, complications can occur. Possible complications include:   Breathing problems related to the anesthetic used.  Cardiac arrest--This risk is rare.  A blood clot that breaks free and travels to other parts of your body. This could cause a stroke or other problems. The risk of this is lowered by use of blood thinning medicine (anticoagulant) prior to the procedure. BEFORE THE PROCEDURE   You may have tests to detect blood clots in your heart and evaluate heart function.  You may start taking anticoagulants so your blood does not clot as easily.   Medicines may be given to help stabilize your heart rate and rhythm. PROCEDURE  You will be given medicine through an IV tube to reduce discomfort and make you sleepy (sedative).   An electrical shock will be delivered. AFTER THE PROCEDURE Your heart rhythm will be watched to make sure it does not change.You may be able to go home within a few hours.  Document Released: 12/21/2001 Document Revised: 10/21/2012 Document Reviewed: 07/15/2012 [facility] Patient Information 2015 [place], [place]. This information is not intended to replace advice given to you by your health care provider. Make sure you discuss any questions you have with your health care provider.

## 2013-08-05 ENCOUNTER — Encounter (HOSPITAL_COMMUNITY): Payer: Self-pay | Admitting: Cardiovascular Disease

## 2013-08-06 DIAGNOSIS — I4892 Unspecified atrial flutter: Secondary | ICD-10-CM

## 2013-08-19 ENCOUNTER — Other Ambulatory Visit: Payer: Self-pay | Admitting: Internal Medicine

## 2013-08-23 ENCOUNTER — Telehealth: Payer: Self-pay | Admitting: Internal Medicine

## 2013-08-23 NOTE — Telephone Encounter (Signed)
New message    Patient aware that nurse is off today   Would like a call back today.     xarelto cost is $ 161.00 patient is asking for something less expensive.

## 2013-08-23 NOTE — Telephone Encounter (Signed)
Spoke with pt who reports cost of Xarelto has gone to $156 per month. Was $36. Pharmacy sold him a 4 day supply. He is asking for cheaper alternative. Will forward to Dr. Caryl Comes. I told pt I would leave samples of Xarelto at front desk for him and also assistance program paperwork. He is aware to return completed paperwork to our office. Xarelto 20 mg, 10 tablets, Lot [id number], exp. 6/17 left at front desk.

## 2013-08-26 ENCOUNTER — Ambulatory Visit (INDEPENDENT_AMBULATORY_CARE_PROVIDER_SITE_OTHER): Payer: Medicare Other | Admitting: Nurse Practitioner

## 2013-08-26 ENCOUNTER — Encounter: Payer: Self-pay | Admitting: Nurse Practitioner

## 2013-08-26 VITALS — BP 105/64 | HR 59 | Ht 72.0 in | Wt 165.8 lb

## 2013-08-26 DIAGNOSIS — R251 Tremor, unspecified: Secondary | ICD-10-CM

## 2013-08-26 DIAGNOSIS — G3184 Mild cognitive impairment, so stated: Secondary | ICD-10-CM

## 2013-08-26 DIAGNOSIS — R259 Unspecified abnormal involuntary movements: Secondary | ICD-10-CM

## 2013-08-26 NOTE — Patient Instructions (Signed)
I think overall you are doing fairly well but I do want to suggest a few things today:  Remember to drink plenty of fluid, eat healthy meals and do not skip any meals. Try to eat protein with a every meal and eat a healthy snack such as fruit or nuts in between meals. Try to keep a regular sleep-wake schedule and try to exercise daily, particularly in the form of walking, 20-30 minutes a day, if you can. Good nutrition, proper sleep and exercise can help her cognitive function.  Engage in social activities in your community and with your family and try to keep up with current events by reading the newspaper or watching the news. If you have computer and can go online, try [email or web address]. Also, you may like to do word finding puzzles or crossword puzzles.  As far as your medications are concerned, I would like to suggest to Continue Inderal LA 60 mg daily for tremors. Continue Cerefolin NAC for memory.  Dr. Leonie Man would like to see you back in 12 months, sooner if we need to. Please call us with any interim questions, concerns, problems, updates or refill requests.  Please also call us for any test results so we can go over those with you on the phone. Richardson Landry is my clinical assistant and will answer any of your questions and relay your messages to me and also relay most of my messages to you.  Our phone number is [phone]. We also have an after hours call service for urgent matters and there is a physician on-call for urgent questions. For any emergencies you know to call 911 or go to the nearest emergency room.

## 2013-08-26 NOTE — Progress Notes (Signed)
PATIENT: [name] DOB: 01-04-1932  REASON FOR VISIT: routine follow up for memory, tremor HISTORY FROM: patient  HISTORY OF PRESENT ILLNESS: 80 year old male with a mild memory loss and cognitive impairment likely from age-related minimal cognitive impairment. Small improvement since last visit with Cerefloin. Tremors are slightly worse left greater than right, etiology unclear whether benign essential or mild Parkinsonism secondary to remote history of left frontal subdural hematoma.   He returns for followup after last visit on 10/09/2011. He states improvement in his hand tremor since using Inderal LA 60 mg daily which he seems to be tolerating it quite well without significant side effects. He states his typing is a lot better. He does complain of mild orthostatic dizziness when he stands up. He is concerned about his short-term memory difficulties which he feels are slightly getting worse over the years. He forgets appointments but may remember them later. He needs to write himself constant notes and reminders. He does have a family history of dementia in his mother. 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 He has no new complaints. Despite his subjective worsening of memory on MMSE testing today he scored 29/30 which is unchanged from last visit.   UPDATE 08/20/12 (LL):[name] returns for follow up for memory loss and tremors. His tremors are well controlled on Inderal LA and he continues to take Cerefolin NAC daily with no problems. He has a pacemaker and takes Xarelto daily for atrial fibrillation. He scores 30/30 on his MMSE today, with 4/4 clock drawing and naming 11 "f" words in 1 minute. No new neurovascular problems.   UPDATE 08/26/13 (LL): [name] returns for yearly follow up for memory loss and tremor.  His tremors are well controlled and have not worsened. He states in June he was hospitalized for urinary retention and needed to have stents placed. He developed  UTI and there was concern for urosepsis and he was hospitalized. He was discharged to home with home health and he has recovered well, although he states he hasn't gotten all of his strength back. Last month he went through an elective cardioversion and has been holding sinus rhythm ever since. He is interested in starting an regular exercise program. He feels as if his short-term memory has declined some, but this feeling is not reflected in his MMSE score of 29/30. AFT 10 and clock drawing 4/4  REVIEW OF SYSTEMS: Full 14 system review of systems performed and notable only for:  Chills, unexpected weight change, ringing in ears, insomnia, daytime sleepiness, incontinence of bladder, urinary urgency, back pain, memory loss, decreased concentration.   ALLERGIES: No Known Allergies  HOME MEDICATIONS: Outpatient Prescriptions Prior to Visit  Medication Sig Dispense Refill  . calcium citrate-vitamin D (CITRACAL+D) 315-200 MG-UNIT per tablet Take 1 tablet by mouth 2 (two) times daily.      . Glucosamine-Chondroit-Vit C-Mn (GLUCOSAMINE 1500 COMPLEX) CAPS Take 1 capsule by mouth 2 (two) times daily.      Marland Kitchen levothyroxine (SYNTHROID, LEVOTHROID) 137 MCG tablet Take 137 mcg by mouth daily before breakfast.      . Melatonin 3 MG TABS Take 3 mg by mouth at bedtime.       . Methylfol-Methylcob-Acetylcyst (CEREFOLIN NAC) 6-2-600 MG TABS Take 1 tablet by mouth daily.      . Multiple Vitamin (MULTIVITAMIN WITH MINERALS) TABS tablet Take 1 tablet by mouth daily.      . Omega-3 Krill Oil 300 MG CAPS Take 300 mg by mouth  daily.      . propranolol ER (INDERAL LA) 60 MG 24 hr capsule TAKE 1 CAPSULE (60 MG TOTAL) BY MOUTH DAILY.  90 capsule  0  . rivaroxaban (XARELTO) 20 MG TABS tablet Take 1 tablet (20 mg total) by mouth daily with supper.  30 tablet  2  . tamsulosin (FLOMAX) 0.4 MG CAPS capsule Take 0.4 mg by mouth daily.      Marland Kitchen docusate sodium (COLACE) 100 MG capsule Take 1 capsule (100 mg total) by mouth every  12 (twelve) hours.  60 capsule  0  . propranolol ER (INDERAL LA) 60 MG 24 hr capsule Take 60 mg by mouth daily.       No facility-administered medications prior to visit.    PHYSICAL EXAM Filed Vitals:   08/26/13 1507  BP: 105/64  Pulse: 59  Height: 6' (1.829 m)  Weight: 165 lb 12.8 oz (75.206 kg)   Body mass index is 22.48 kg/(m^2).  MMSE - Mini Mental State Exam 08/27/2013  Orientation to time 5  Orientation to Place 5  Registration 3  Attention/ Calculation 5  Recall 2  Language- name 2 objects 2  Language- repeat 1  Language- follow 3 step command 3  Language- read & follow direction 1  Write a sentence 1  Copy design 1  Total score 29   Generalized: In no acute distress. Pleasant elderly Caucasian male.  Neck: Supple, no carotid bruits  Cardiac: Irregular rhythm, no murmur  Pulmonary: Clear to auscultation bilaterally  Musculoskeletal: Mild kyphosis   Neurological examination  Mentation: Alert oriented to time, place, history taking, language fluent, and casual conversation. Cranial nerve II-XII: Pupils were equal round reactive to light extraocular movements were full, visual field were full on confrontational test. facial sensation and strength were normal. hearing was intact to finger rubbing bilaterally. Uvula tongue midline. head turning and shoulder shrug and were normal and symmetric.  MOTOR: normal bulk and tone, full strength in the BUE, BLE, fine finger movements normal, no pronator drift. Fine postural tremors left greater than right. No cogwheeling or bradykinesia. Mild tremulous writing. Able to draw a spiral and joints he points to make a straight line easily.  SENSORY: normal and symmetric to light touch, pinprick  COORDINATION: finger-nose-finger, heel-to-shin bilaterally, there was no truncal ataxia  REFLEXES: Deep tendon reflexes are 2+ symmetric. Negative palmomental.  GAIT/STATION: Short strides, steady gait with mild difficulty with tandem walking.  Turns are smooth.  DIAGNOSTIC DATA (LABS, IMAGING, TESTING) - I reviewed patient records, labs, notes, testing and imaging myself where available.  Lab Results  Component Value Date   WBC 5.2 07/30/2013   HGB 10.7* 07/30/2013   HCT 31.3* 07/30/2013   MCV 96.5 07/30/2013   PLT 199.0 07/30/2013      Component Value Date/Time   NA 138 07/30/2013 1147   K 4.7 07/30/2013 1147   CL 106 07/30/2013 1147   CO2 28 07/30/2013 1147   GLUCOSE 79 07/30/2013 1147   BUN 31* 07/30/2013 1147   CREATININE 1.4 07/30/2013 1147   CALCIUM 9.4 07/30/2013 1147   PROT 5.5* 07/12/2013 0355   ALBUMIN 2.6* 07/12/2013 0355   AST 23 07/12/2013 0355   ALT 18 07/12/2013 0355   ALKPHOS 61 07/12/2013 0355   BILITOT 0.3 07/12/2013 0355   GFRNONAA 50* 07/12/2013 0355   GFRAA 58* 07/12/2013 0355   Lab Results  Component Value Date   VITAMINB12 972* 05/29/2013   Lab Results  Component Value Date   TSH  0.675 05/29/2013    ASSESSMENT: 80 year old male with mild memory loss and cognitive impairment likely from age-related minimal cognitive impairment. Stable since last visit with Cerefolin. Tremors are slightly worse left greater than right, etiology unclear whether benign essential for mild Parkinsonianism secondary to remote history of left frontal subdural hematoma, controlled well on Inderal LA.   PLAN:  Continue Inderal LA 60 mg daily for tremors. Continue to challenge mind with activity such as crossword puzzles, [place], playing bridge daily. Recommended regular exercise and strengthening program appropriate for his age. Continue Cerefolin NAC. Followup with Dr. Leonie Man 12 months, sooner as needed.  Rudi Rummage Skyah Hannon, MSN, FNP-BC, A/GNP-C 08/27/2013, 11:13 AM [facility] [address], [place], [place] [address] [phone]  Note: This document was prepared with digital dictation and possible smart phrase technology. Any transcriptional errors that result from this process are unintentional.

## 2013-08-27 ENCOUNTER — Encounter: Payer: Self-pay | Admitting: Nurse Practitioner

## 2013-08-27 NOTE — Progress Notes (Signed)
I agree with the above plan 

## 2013-08-30 ENCOUNTER — Ambulatory Visit (INDEPENDENT_AMBULATORY_CARE_PROVIDER_SITE_OTHER): Payer: Medicare Other | Admitting: *Deleted

## 2013-08-30 ENCOUNTER — Telehealth: Payer: Self-pay | Admitting: Internal Medicine

## 2013-08-30 DIAGNOSIS — I4891 Unspecified atrial fibrillation: Secondary | ICD-10-CM

## 2013-08-30 LAB — MDC_IDC_ENUM_SESS_TYPE_INCLINIC
Battery Remaining Longevity: 18 mo
Implantable Pulse Generator Serial Number: 319544
Lead Channel Setting Pacing Amplitude: 2 V
MDC IDC SET LEADCHNL RV PACING AMPLITUDE: 2.4 V
MDC IDC SET LEADCHNL RV PACING PULSEWIDTH: 0.4 ms
MDC IDC STAT BRADY RA PERCENT PACED: 21 %
MDC IDC STAT BRADY RV PERCENT PACED: 59 %

## 2013-08-30 NOTE — Telephone Encounter (Signed)
New message      Pt thinks he is in AFIB.  Please advise

## 2013-08-30 NOTE — Telephone Encounter (Signed)
Pt added on for device check 12:30PM per Nicanor Alcon. Pt advised.

## 2013-08-30 NOTE — Telephone Encounter (Signed)
Pt feels like he is back in a fib. Pt states he feels like he went back in to a fib last night. Pt denies lightheadedness or dizziness.

## 2013-08-30 NOTE — Progress Notes (Signed)
Add on today for c/o A-fib post DCCV 08/04/13.  A-fib since 08/29/13, + xarelto.  Patient has noticed an increase in SOB, + xarelto.

## 2013-09-01 NOTE — Telephone Encounter (Signed)
Samples left at front desk for patient pick up. I explained that we were going to give him more samples until we could address medication assistance. Pt is either going to mail paperwork himself or bring to office. Expressed need to have this done soon so that we may address switching to another med if necessary.  Patient verbalized understanding and agreeable to plan.

## 2013-09-01 NOTE — Telephone Encounter (Signed)
New message    Patient calling better options for xarelto due to cost. Only have 2 pills left .   C/O back in Afib over weekend.

## 2013-09-03 ENCOUNTER — Other Ambulatory Visit: Payer: Self-pay | Admitting: Neurology

## 2013-09-06 ENCOUNTER — Other Ambulatory Visit: Payer: Self-pay | Admitting: Urology

## 2013-09-09 ENCOUNTER — Encounter: Payer: Self-pay | Admitting: Internal Medicine

## 2013-09-10 ENCOUNTER — Encounter (HOSPITAL_BASED_OUTPATIENT_CLINIC_OR_DEPARTMENT_OTHER): Payer: Self-pay | Admitting: *Deleted

## 2013-09-14 ENCOUNTER — Encounter (HOSPITAL_BASED_OUTPATIENT_CLINIC_OR_DEPARTMENT_OTHER): Payer: Self-pay | Admitting: *Deleted

## 2013-09-14 NOTE — Progress Notes (Signed)
NPO AFTER MN. ARRIVE AT 0800. NEEDS ISTAT 8. CURRENT EKG IN CHART AND EKG. WILL TAKE SYNTHROID AND INDERAL   AM DOS W/ SIPS OF WATER.

## 2013-09-15 NOTE — H&P (Signed)
Urology History and Physical Exam  CC: Retained right ureteral stent  HPI: 80 year old male presents for anesthetic cystoscopy and attempted right ureteral stent extraction--his right ureteral stent placed for ureteral obstruction from locally advanced PCa migrated proximally.  PMH: Past Medical History  Diagnosis Date  . Orthostatic lightheadedness   . History of subdural hematoma     LEFT CHRONIC SUBDURAL HEMATOMA   S/P PARIETAL CRANIOTOMY WITH EVACUATION HEMATOMA  . Atrial fibrillation, persistent   . H/O cardiac radiofrequency ablation     A-FLUTTER  . Cardiac pacemaker last check 08-30-2013    05-05-2007  DD  [employer] GUIDANT INSIGNIA  . RBBB   . Benign essential tremor   . Diverticulosis of colon   . Tachy-brady syndrome   . SSS (sick sinus syndrome)   . Recurrent prostate carcinoma     2005  S/P RADIOACTIVE SEED IMPLANTS/   BLADDER NECK MASS RESECTED 05/ 2015  SHOWED RECURRENT PROSTATE CANCER  . Hydronephrosis, bilateral   . Renal insufficiency     PSH: Past Surgical History  Procedure Laterality Date  . Tonsillectomy  as child  . Subdural hematoma evacuation via craniotomy  12-16-2007  . Rotator cuff repair w/ distal clavicle excision Left 08-31-2010  . Pacemaker insertion  05-05-2007      Guidant Insignia [employer])  . Total knee arthroplasty  12/18/2010    Procedure: TOTAL KNEE ARTHROPLASTY;  Surgeon: Mauri Pole;  Location: [facility];  Service: Orthopedics;  Laterality: Left;  . Cataract extraction    . Cystoscopy w/ ureteral stent placement Bilateral 05/30/2013    Procedure: CYSTOSCOPY WITH RETROGRADE PYELOGRAM/ BILATERAL URETERAL STENT PLACEMENT, TRANSURETRAL RESECTION OF BLADDER TUMOR WITH GYRUS;  Surgeon: Alexis Frock, MD;  Location: [facility];  Service: Urology;  Laterality: Bilateral;  . Cardioversion N/A 08/04/2013    Procedure: CARDIOVERSION;  Surgeon: Sanda Klein, MD;  Location: [facility];  Service: Cardiovascular;  Laterality: N/A;  . Knee  arthroscopy Left 02-11-2007  . Cardioversion  multiple--  last one 08-04-2013    SINCE 2008  . Cardiac electrophysiology mapping and ablation  04-06-2007  dr Caryl Comes  . Cardiac catheterization  12-22-2006      non-obstructive CAD / RCA 20%/  ef 50%/  a-fib  . Radioactive prostate seed implants  12-21-2003  . Orif left distal clavical fx  08-31-2010  . Colonoscopy w/ polypectomy  12-01-2008  . Transthoracic echocardiogram  05-31-2013    MILD LVH/  EF 65%/  MODERATE LAE/  TRIVIAL MR  &  TR    Allergies: No Known Allergies  Medications: No prescriptions prior to admission     Social History: History   Social History  . Marital Status: Married    Spouse Name: margaret    Number of Children: 2  . Years of Education: BA   Occupational History  . retired    Social History Main Topics  . Smoking status: Never Smoker   . Smokeless tobacco: Never Used  . Alcohol Use: 7.7 oz/week    7 Glasses of wine, 7 Drinks containing 0.5 oz of alcohol per week     Comment: 2 glasses wine day  . Drug Use: No  . Sexual Activity: Not on file   Other Topics Concern  . Not on file   Social History Narrative   Patient lives at home with his spouse.   Caffeine Use: 2 cups of coffee    Family History: Family History  Problem Relation Age of Onset  . Aneurysm Father   .  Stroke Father     Review of Systems: Positive: OAB symptoms Negative:  .  A further 10 point review of systems was negative except what is listed in the HPI.                  Physical Exam: @VITALS2 @ General: No acute distress.  Awake. Head:  Normocephalic.  Atraumatic. ENT:  EOMI.  Mucous membranes moist Neck:  Supple.  No lymphadenopathy. CV:  S1 present. S2 present. Regular rate. Pulmonary: Equal effort bilaterally.  Clear to auscultation bilaterally. Abdomen: Soft.  Non tender to palpation. Skin:  Normal turgor.  No visible rash. Extremity: No gross deformity of bilateral upper extremities.  No gross deformity  of                             lower extremities. Neurologic: Alert. Appropriate mood.    Studies:  No results found for this basename: HGB, WBC, PLT,  in the last 72 hours  No results found for this basename: NA, K, CL, CO2, BUN, CREATININE, CALCIUM, MAGNESIUM, GFRNONAA, GFRAA,  in the last 72 hours   No results found for this basename: PT, INR, APTT,  in the last 72 hours   No components found with this basename: ABG,     Assessment:     1. Adenocarcinoma the prostate. Clinical stage TI C. Gleason 3+3. Pretreatment PSA was 4.4. He underwent radiotherapy with I-125 brachytherapy on 12/21/2003. He was found in May, 2015 to have recurrent, high-grade disease producing bladder outlet obstruction and bilateral hydronephrosis. He was treated with TUR of the bladder neck, as well as double-J stent placement. He has initiated androgen deprivation, and has had an excellent PSA response with his most recent PSA being 0.45 on 07/23/2013.      2. Migrated right uretral stent  Plan: Cysto, right retrograde, right ureteroscopic stent removal

## 2013-09-16 ENCOUNTER — Ambulatory Visit (HOSPITAL_BASED_OUTPATIENT_CLINIC_OR_DEPARTMENT_OTHER)
Admission: RE | Admit: 2013-09-16 | Discharge: 2013-09-16 | Disposition: A | Discharge status: Home / Self Care | Payer: Medicare Other | Source: Ambulatory Visit | Attending: Urology | Admitting: Urology

## 2013-09-16 ENCOUNTER — Encounter (HOSPITAL_BASED_OUTPATIENT_CLINIC_OR_DEPARTMENT_OTHER): Payer: Medicare Other | Admitting: Anesthesiology

## 2013-09-16 ENCOUNTER — Encounter (HOSPITAL_BASED_OUTPATIENT_CLINIC_OR_DEPARTMENT_OTHER)
Admission: RE | Disposition: A | Discharge status: Home / Self Care | Payer: Self-pay | Source: Ambulatory Visit | Attending: Urology

## 2013-09-16 ENCOUNTER — Encounter (HOSPITAL_BASED_OUTPATIENT_CLINIC_OR_DEPARTMENT_OTHER): Payer: Self-pay | Admitting: *Deleted

## 2013-09-16 ENCOUNTER — Encounter: Payer: Medicare Other | Admitting: Internal Medicine

## 2013-09-16 ENCOUNTER — Ambulatory Visit (HOSPITAL_BASED_OUTPATIENT_CLINIC_OR_DEPARTMENT_OTHER): Payer: Medicare Other | Admitting: Anesthesiology

## 2013-09-16 DIAGNOSIS — I4891 Unspecified atrial fibrillation: Secondary | ICD-10-CM | POA: Insufficient documentation

## 2013-09-16 DIAGNOSIS — Z466 Encounter for fitting and adjustment of urinary device: Secondary | ICD-10-CM | POA: Diagnosis present

## 2013-09-16 DIAGNOSIS — D649 Anemia, unspecified: Secondary | ICD-10-CM | POA: Insufficient documentation

## 2013-09-16 DIAGNOSIS — N1330 Unspecified hydronephrosis: Secondary | ICD-10-CM | POA: Insufficient documentation

## 2013-09-16 DIAGNOSIS — Z923 Personal history of irradiation: Secondary | ICD-10-CM | POA: Insufficient documentation

## 2013-09-16 DIAGNOSIS — N179 Acute kidney failure, unspecified: Secondary | ICD-10-CM | POA: Diagnosis not present

## 2013-09-16 DIAGNOSIS — C61 Malignant neoplasm of prostate: Secondary | ICD-10-CM | POA: Insufficient documentation

## 2013-09-16 DIAGNOSIS — Z96659 Presence of unspecified artificial knee joint: Secondary | ICD-10-CM | POA: Insufficient documentation

## 2013-09-16 DIAGNOSIS — Z950 Presence of cardiac pacemaker: Secondary | ICD-10-CM | POA: Diagnosis not present

## 2013-09-16 HISTORY — DX: Diverticulosis of large intestine without perforation or abscess without bleeding: K57.30

## 2013-09-16 HISTORY — DX: Disorder of kidney and ureter, unspecified: N28.9

## 2013-09-16 HISTORY — DX: Malignant neoplasm of prostate: C61

## 2013-09-16 HISTORY — DX: Personal history of other diseases of the circulatory system: Z86.79

## 2013-09-16 HISTORY — DX: Presence of cardiac pacemaker: Z95.0

## 2013-09-16 HISTORY — PX: CYSTOSCOPY/RETROGRADE/URETEROSCOPY: SHX5316

## 2013-09-16 HISTORY — DX: Unspecified right bundle-branch block: I45.10

## 2013-09-16 HISTORY — DX: Essential tremor: G25.0

## 2013-09-16 HISTORY — DX: Unspecified hydronephrosis: N13.30

## 2013-09-16 HISTORY — DX: Sick sinus syndrome: I49.5

## 2013-09-16 HISTORY — DX: Other specified postprocedural states: Z98.890

## 2013-09-16 HISTORY — DX: Other persistent atrial fibrillation: I48.19

## 2013-09-16 LAB — POCT I-STAT, CHEM 8
BUN: 30 mg/dL — ABNORMAL HIGH (ref 6–23)
CHLORIDE: 105 meq/L (ref 96–112)
Calcium, Ion: 1.3 mmol/L (ref 1.13–1.30)
Creatinine, Ser: 1.2 mg/dL (ref 0.50–1.35)
GLUCOSE: 87 mg/dL (ref 70–99)
HCT: 32 % — ABNORMAL LOW (ref 39.0–52.0)
Hemoglobin: 10.9 g/dL — ABNORMAL LOW (ref 13.0–17.0)
POTASSIUM: 4.4 meq/L (ref 3.7–5.3)
SODIUM: 141 meq/L (ref 137–147)
TCO2: 23 mmol/L (ref 0–100)

## 2013-09-16 SURGERY — CYSTOSCOPY/RETROGRADE/URETEROSCOPY
Anesthesia: General | Site: Ureter | Laterality: Right

## 2013-09-16 MED ORDER — STERILE WATER FOR IRRIGATION IR SOLN
Status: DC | PRN
  Administered 2013-09-16: 500 mL

## 2013-09-16 MED ORDER — DEXAMETHASONE SODIUM PHOSPHATE 4 MG/ML IJ SOLN
INTRAMUSCULAR | Status: DC | PRN
  Administered 2013-09-16: 4 mg via INTRAVENOUS

## 2013-09-16 MED ORDER — LACTATED RINGERS IV SOLN
INTRAVENOUS | Status: DC
  Administered 2013-09-16: 09:00:00 via INTRAVENOUS
  Filled 2013-09-16: qty 1000

## 2013-09-16 MED ORDER — CEFAZOLIN SODIUM 1-5 GM-% IV SOLN
1.0000 g | INTRAVENOUS | Status: DC
  Filled 2013-09-16: qty 50

## 2013-09-16 MED ORDER — CEFAZOLIN SODIUM-DEXTROSE 2-3 GM-% IV SOLR
2.0000 g | INTRAVENOUS | Status: AC
  Administered 2013-09-16: 2 g via INTRAVENOUS
  Filled 2013-09-16: qty 50

## 2013-09-16 MED ORDER — PROPOFOL 10 MG/ML IV BOLUS
INTRAVENOUS | Status: DC | PRN
  Administered 2013-09-16: 150 mg via INTRAVENOUS
  Administered 2013-09-16: 50 mg via INTRAVENOUS

## 2013-09-16 MED ORDER — SODIUM CHLORIDE 0.9 % IR SOLN
Status: DC | PRN
  Administered 2013-09-16: 4000 mL

## 2013-09-16 MED ORDER — FENTANYL CITRATE 0.05 MG/ML IJ SOLN
INTRAMUSCULAR | Status: DC | PRN
  Administered 2013-09-16: 50 ug via INTRAVENOUS
  Administered 2013-09-16 (×2): 25 ug via INTRAVENOUS

## 2013-09-16 MED ORDER — CEFAZOLIN SODIUM-DEXTROSE 2-3 GM-% IV SOLR
INTRAVENOUS | Status: AC
  Filled 2013-09-16: qty 50

## 2013-09-16 MED ORDER — LIDOCAINE HCL (CARDIAC) 20 MG/ML IV SOLN
INTRAVENOUS | Status: DC | PRN
  Administered 2013-09-16: 50 mg via INTRAVENOUS

## 2013-09-16 MED ORDER — FENTANYL CITRATE 0.05 MG/ML IJ SOLN
INTRAMUSCULAR | Status: AC
  Filled 2013-09-16: qty 2

## 2013-09-16 MED ORDER — IOHEXOL 350 MG/ML SOLN
INTRAVENOUS | Status: DC | PRN
  Administered 2013-09-16: 17 mL

## 2013-09-16 MED ORDER — CEPHALEXIN 500 MG PO CAPS: 500.0000 mg | ORAL_CAPSULE | Freq: Two times a day (BID) | ORAL | Status: DC

## 2013-09-16 MED ORDER — ONDANSETRON HCL 4 MG/2ML IJ SOLN
INTRAMUSCULAR | Status: DC | PRN
  Administered 2013-09-16: 4 mg via INTRAVENOUS

## 2013-09-16 SURGICAL SUPPLY — 29 items
ADAPTER CATH URET PLST 4-6FR (CATHETERS) IMPLANT
ADPR CATH URET STRL DISP 4-6FR (CATHETERS)
BAG DRAIN URO-CYSTO SKYTR STRL (DRAIN) ×3 IMPLANT
BAG DRN UROCATH (DRAIN) ×1
BASKET ZERO TIP NITINOL 2.4FR (BASKET) IMPLANT
BSKT STON RTRVL ZERO TP 2.4FR (BASKET)
CANISTER SUCT LVC 12 LTR MEDI- (MISCELLANEOUS) IMPLANT
CATH INTERMIT  6FR 70CM (CATHETERS) ×2 IMPLANT
CATH URET 5FR 28IN OPEN ENDED (CATHETERS) ×2 IMPLANT
CLOTH BEACON ORANGE TIMEOUT ST (SAFETY) ×3 IMPLANT
DRAPE CAMERA CLOSED 9X96 (DRAPES) ×3 IMPLANT
FIBER LASER FLEXIVA 200 (UROLOGICAL SUPPLIES) IMPLANT
FIBER LASER FLEXIVA 365 (UROLOGICAL SUPPLIES) IMPLANT
GLOVE BIO SURGEON STRL SZ8 (GLOVE) ×3 IMPLANT
GLOVE SURG SS PI 7.5 STRL IVOR (GLOVE) ×4 IMPLANT
GOWN PREVENTION PLUS LG XLONG (DISPOSABLE) ×1 IMPLANT
GOWN STRL REIN XL XLG (GOWN DISPOSABLE) ×1 IMPLANT
GOWN STRL REUS W/ TWL XL LVL3 (GOWN DISPOSABLE) IMPLANT
GOWN STRL REUS W/TWL XL LVL3 (GOWN DISPOSABLE) ×5 IMPLANT
GUIDEWIRE 0.038 PTFE COATED (WIRE) IMPLANT
GUIDEWIRE ANG ZIPWIRE 038X150 (WIRE) ×2 IMPLANT
GUIDEWIRE STR DUAL SENSOR (WIRE) ×2 IMPLANT
IV NS 1000ML (IV SOLUTION) ×3
IV NS 1000ML BAXH (IV SOLUTION) IMPLANT
IV NS IRRIG 3000ML ARTHROMATIC (IV SOLUTION) ×4 IMPLANT
NS IRRIG 500ML POUR BTL (IV SOLUTION) IMPLANT
PACK CYSTOSCOPY (CUSTOM PROCEDURE TRAY) ×3 IMPLANT
STENT CONTOUR 7FR X 28 (STENTS) ×2 IMPLANT
STENT URET 6FRX24 CONTOUR (STENTS) ×2 IMPLANT

## 2013-09-16 NOTE — Op Note (Signed)
PATIENT:  [name]  PRE-OPERATIVE DIAGNOSIS: Retained right ureteral stent, history of bilateral hydronephrosis  POST-OPERATIVE DIAGNOSIS: Same, continued bilateral hydronephrosis  PROCEDURE: Cystoscopy, right double-J stent extraction, bilateral retrograde ureteropyelograms with interpretive fluoroscopy, bilateral double-J stent placement (on left, 6 [place] by 24 cm contour, on right, 7 [place] by 26 cm, both without tether  SURGEON:  Lillette Boxer. Timara Loma, M.D.  ANESTHESIA:  General  EBL:  Minimal  DRAINS: See above  LOCAL MEDICATIONS USED:  None  SPECIMEN:  None  INDICATION: [name] is a 80 year old male with locally aggressive/recurrent prostate cancer. He underwent cystoscopic TUR/unroofing of both ureteral orifices earlier this summer. That was for bilateral hydronephrosis and bladder neck obstruction. His left stent was removed several weeks ago. His right was unable to be visualized for stent removal. He presents at this time for possible right double-J stent removal through either cystoscopic or ureteroscopic guidance. Risks and complications of the procedure have been discussed with the patient.  Description of procedure: The patient was properly identified and marked (if applicable) in the holding area. They were then  taken to the operating room and placed on the table in a supine position. General anesthesia was then administered. Once fully anesthetized the patient was moved to the dorsolithotomy position and the genitalia and perineum were sterilely prepped and draped in standard fashion. An official timeout was then performed.  A 22 French panendoscope was advanced under direct vision through his urethra. There was minimal narrowing of the resected bladder neck. There was no significant nodular regrowth. The bladder was entered and inspected circumferentially. The midline, the bladder neck/trigonal area was distorted from obvious prostate cancer regrowth. The left  ureteral orifice was minimally visualized just to the left of the midline. There was a little bolus edema around that. I could see the right double-J stent buried in the middle of the trigone. Only about 2 mm was visualized.  I first used a 6 [place] open-ended catheter, and with the assistance of a guidewire advanced it a little bit up the left ureter. Retrograde ureteropyelogram was performed. This revealed significant columnization/mild hydroureter as well as a dilated pyelocalyceal system. There was no evidence of stricture proximally, or filling defect. I felt that, because of the significant columnization, that I should replace a double-J stent.  Over the guidewire, it passed a 24 cm x 6 [place] contour stent. With removal of the wire, good proximal and distal curls were seen both fluoroscopically and cystoscopically.  At this point, I used the grasping forceps to grasp the tip of the right double-J stent which was buried within the trigonal tissue. After several tries, I was able to grasp it, and partially extracted it through the urethral meatus. I tried passing a sensor-tip guidewire through it, which was quite difficult because of obvious blockage of the catheter. I then was able to negotiate a glide wire through the stent, up into the more proximal ureter. I then performed a retrograde ureteropyelogram which with a 6 [place] open-ended catheter.  This revealed, as on the left side, significant columnization of contrast all the way down to the distal ureter. No strictures or filling defects were seen along the course of the ureter or in the dilated pyelocalyceal system.  I felt judicious to leave a stent on the right side tube. Over top of the guidewire, I then passed a 26 cm x 7 [place] contour stent. The string had been removed from both this and the left-sided stent. After removing wire,  good proximal and distal curls were seen fluoroscopically and cystoscopically.  At this point, the bladder  was drained and the procedure terminated. The patient tolerated the procedure well and was returned to the PACU in stable condition.    PLAN OF CARE: Discharge to home after PACU  PATIENT DISPOSITION:  PACU - hemodynamically stable.

## 2013-09-16 NOTE — Transfer of Care (Signed)
Immediate Anesthesia Transfer of Care Note  Patient: [name]  Procedure(s) Performed: Procedure(s): CYSTOSCOPY/RETROGRADE/URETEROSCOPIC STENT EXTRACTION/  bil retrograde pyelogram with inseertion of bilateral stents (Right)  Patient Location: PACU  Anesthesia Type:General  Level of Consciousness: sedated and responds to stimulation  Airway & Oxygen Therapy: Patient Spontanous Breathing and Patient connected to nasal cannula oxygen  Post-op Assessment: Report given to PACU RN  Post vital signs: Reviewed and stable  Complications: No apparent anesthesia complications

## 2013-09-16 NOTE — Discharge Instructions (Signed)
1. You may see some blood in the urine and may have some burning with urination for 48-72 hours. You also may notice that you have to urinate more frequently or urgently after your procedure which is normal.  2. You should call should you develop an inability urinate, fever > 101, persistent nausea and vomiting that prevents you from eating or drinking to stay hydrated.  3. If you have a stent, you will likely urinate more frequently and urgently until the stent is removed and you may experience some discomfort/pain in the lower abdomen and flank especially when urinating. You may take pain medication prescribed to you if needed for pain. You may also intermittently have blood in the urine until the stent is removed. If you have a catheter, you will be taught how to take care of the catheter by the nursing staff prior to discharge from the hospital.  You may periodically feel a strong urge to void with the catheter in place.  This is a bladder spasm and most often can occur when having a bowel movement or moving around. It is typically self-limited and usually will stop after a few minutes.  You may use some Vaseline or Neosporin around the tip of the catheter to reduce friction at the tip of the penis. You may also see some blood in the urine.  A very small amount of blood can make the urine look quite red.  As long as the catheter is draining well, there usually is not a problem.  However, if the catheter is not draining well and is bloody, you should call the office [phone]) to notify us. [facility] [phone] Post Ureteroscopy With or Without Stent Instructions  Definitions:  Ureter: The duct that transports urine from the kidney to the bladder. Stent:   A plastic hollow tube that is placed into the ureter, from the kidney to the                 bladder to prevent the ureter from swelling shut.  GENERAL INSTRUCTIONS:  Despite the fact that no skin incisions were used, the  area around the ureter and bladder is raw and irritated. The stent is a foreign body which will further irritate the bladder wall. This irritation is manifested by increased frequency of urination, both day and night, and by an increase in the urge to urinate. In some, the urge to urinate is present almost always. Sometimes the urge is strong enough that you may not be able to stop yourself from urinating. The only real cure is to remove the stent and then give time for the bladder wall to heal which can't be done until the danger of the ureter swelling shut has passed, which varies.  You may see some blood in your urine while the stent is in place and a few days afterwards. Do not be alarmed, even if the urine was clear for a while. Get off your feet and drink lots of fluids until clearing occurs. If you start to pass clots or don't improve, call us.  DIET: You may return to your normal diet immediately. Because of the raw surface of your bladder, alcohol, spicy foods, acid type foods and drinks with caffeine may cause irritation or frequency and should be used in moderation. To keep your urine flowing freely and to avoid constipation, drink plenty of fluids during the day ( 8-10 glasses ). Tip: Avoid cranberry juice because it is very acidic.  ACTIVITY: Your physical  activity doesn't need to be restricted. However, if you are very active, you may see some blood in your urine. We suggest that you reduce your activity under these circumstances until the bleeding has stopped.  BOWELS: It is important to keep your bowels regular during the postoperative period. Straining with bowel movements can cause bleeding. A bowel movement every other day is reasonable. Use a mild laxative if needed, such as Milk of Magnesia 2-3 tablespoons, or 2 Dulcolax tablets. Call if you continue to have problems. If you have been taking narcotics for pain, before, during or after your surgery, you may be constipated. Take a  laxative if necessary.   MEDICATION: You should resume your pre-surgery medications unless told not to. In addition you will often be given an antibiotic to prevent infection. These should be taken as prescribed until the bottles are finished unless you are having an unusual reaction to one of the drugs.  PROBLEMS YOU SHOULD REPORT TO [place]: Fevers over 100.5 Fahrenheit. Heavy bleeding, or clots ( See above notes about blood in urine ). Inability to urinate. Drug reactions ( hives, rash, nausea, vomiting, diarrhea ). Severe burning or pain with urination that is not improving.  FOLLOW-UP: You will need a follow-up appointment to monitor your progress. Call for this appointment at the number listed above. Usually the first appointment will be about three to fourteen days after your surgery.      Post Anesthesia Home Care Instructions  Activity: Get plenty of rest for the remainder of the day. A responsible adult should stay with you for 24 hours following the procedure.  For the next 24 hours, DO NOT: -Drive a car -Paediatric nurse -Drink alcoholic beverages -Take any medication unless instructed by your physician -Make any legal decisions or sign important papers.  Meals: Start with liquid foods such as gelatin or soup. Progress to regular foods as tolerated. Avoid greasy, spicy, heavy foods. If nausea and/or vomiting occur, drink only clear liquids until the nausea and/or vomiting subsides. Call your physician if vomiting continues.  Special Instructions/Symptoms: Your throat may feel dry or sore from the anesthesia or the breathing tube placed in your throat during surgery. If this causes discomfort, gargle with warm salt water. The discomfort should disappear within 24 hours. 4.

## 2013-09-16 NOTE — Anesthesia Procedure Notes (Signed)
Procedure Name: LMA Insertion Date/Time: 09/16/2013 9:50 AM Performed by: Bethena Roys T Pre-anesthesia Checklist: Patient identified, Emergency Drugs available, Suction available and Patient being monitored Patient Re-evaluated:Patient Re-evaluated prior to inductionOxygen Delivery Method: Circle System Utilized Preoxygenation: Pre-oxygenation with 100% oxygen Intubation Type: IV induction Ventilation: Mask ventilation without difficulty LMA: LMA inserted LMA Size: 5.0 Number of attempts: 1 Airway Equipment and Method: bite block Placement Confirmation: positive ETCO2 Dental Injury: Teeth and Oropharynx as per pre-operative assessment

## 2013-09-16 NOTE — Anesthesia Preprocedure Evaluation (Signed)
Anesthesia Evaluation  Patient identified by MRN, date of birth, ID band Patient awake  General Assessment Comment: Bradycardia         s/p PPM   .  Atrial flutter         s/p ablation   .  Atrial fibrillation     .  Prostate cancer  12-21-2003       12-7-seed implants done   .  Hematoma     .  Mitral regurgitation         mild echo 2012   .  Pacemaker --[employer]     .  Orthostatic lightheadedness     .  Subdural hematoma     Reviewed: Allergy & Precautions, H&P , NPO status , Patient's Chart, lab work & pertinent test results  Airway Mallampati: II TM Distance: >3 FB Neck ROM: Full    Dental no notable dental hx.    Pulmonary neg pulmonary ROS,  Chronic cough for three months of unknown etiology. Nebulizer treatment today.  Lungs clear with good air exchange. breath sounds clear to auscultation  Pulmonary exam normal       Cardiovascular Exercise Tolerance: Good + dysrhythmias Atrial Fibrillation + pacemaker Rhythm:Regular Rate:Normal     Neuro/Psych negative neurological ROS  negative psych ROS   GI/Hepatic negative GI ROS, Neg liver ROS,   Endo/Other  negative endocrine ROS  Renal/GU ARFRenal diseaseK 4.2 Cr 4.59  negative genitourinary   Musculoskeletal negative musculoskeletal ROS (+)   Abdominal   Peds negative pediatric ROS (+)  Hematology  (+) anemia , Hgb 8.7   Anesthesia Other Findings   Reproductive/Obstetrics negative OB ROS                           Anesthesia Physical  Anesthesia Plan  ASA: III  Anesthesia Plan: General   Post-op Pain Management:    Induction: Intravenous  Airway Management Planned: LMA  Additional Equipment:   Intra-op Plan:   Post-operative Plan: Extubation in OR  Informed Consent: I have reviewed the patients History and Physical, chart, labs and discussed the procedure including the risks, benefits and alternatives for  the proposed anesthesia with the patient or authorized representative who has indicated his/her understanding and acceptance.   Dental advisory given  Plan Discussed with: CRNA  Anesthesia Plan Comments: (Sinus rhythm today)        Anesthesia Quick Evaluation

## 2013-09-17 ENCOUNTER — Encounter (HOSPITAL_BASED_OUTPATIENT_CLINIC_OR_DEPARTMENT_OTHER): Payer: Self-pay | Admitting: Urology

## 2013-09-22 ENCOUNTER — Ambulatory Visit (INDEPENDENT_AMBULATORY_CARE_PROVIDER_SITE_OTHER): Payer: Medicare Other | Admitting: Internal Medicine

## 2013-09-22 ENCOUNTER — Encounter: Payer: Self-pay | Admitting: Internal Medicine

## 2013-09-22 VITALS — BP 110/52 | HR 60 | Ht 72.0 in | Wt 167.6 lb

## 2013-09-22 DIAGNOSIS — I4891 Unspecified atrial fibrillation: Secondary | ICD-10-CM

## 2013-09-22 DIAGNOSIS — I442 Atrioventricular block, complete: Secondary | ICD-10-CM

## 2013-09-22 DIAGNOSIS — Z950 Presence of cardiac pacemaker: Secondary | ICD-10-CM

## 2013-09-22 DIAGNOSIS — I495 Sick sinus syndrome: Secondary | ICD-10-CM

## 2013-09-22 LAB — MDC_IDC_ENUM_SESS_TYPE_INCLINIC
Brady Statistic RV Percent Paced: 74 %
Implantable Pulse Generator Model: 1297
Lead Channel Impedance Value: 440 Ohm
Lead Channel Pacing Threshold Amplitude: 0.7 V
Lead Channel Pacing Threshold Amplitude: 0.8 V
Lead Channel Pacing Threshold Pulse Width: 0.4 ms
Lead Channel Pacing Threshold Pulse Width: 0.4 ms
Lead Channel Setting Pacing Amplitude: 2 V
Lead Channel Setting Pacing Amplitude: 2.4 V
Lead Channel Setting Sensing Sensitivity: 2.5 mV
MDC IDC MSMT LEADCHNL RA IMPEDANCE VALUE: 380 Ohm
MDC IDC MSMT LEADCHNL RA SENSING INTR AMPL: 2.8 mV
MDC IDC MSMT LEADCHNL RV SENSING INTR AMPL: 9.2 mV
MDC IDC PG SERIAL: 319544
MDC IDC SESS DTM: 20150909040000
MDC IDC SET LEADCHNL RV PACING PULSEWIDTH: 0.4 ms
MDC IDC STAT BRADY RA PERCENT PACED: 51 %

## 2013-09-22 MED ORDER — PROPAFENONE HCL 225 MG PO TABS: 225.0000 mg | ORAL_TABLET | Freq: Two times a day (BID) | ORAL | Status: DC

## 2013-09-22 NOTE — Progress Notes (Signed)
Patient Care Team: Lajean Manes, MD as PCP - General (Internal Medicine)   HPI  [name] is a 80 y.o. male  Seen in followup for atrial fibrillation for which he was placed on flecainide. He was seen a few weeks ago and was found to have a broad complex rhythm that was concerning for flecainide toxicity. There was an atrial tachycardia that Dr. Lovena Le successfully pace terminated.  It was elected to continue his flecainide. The device was reprogrammed to detect slower arrhythmias. The flecainide dose was decreased from 100-50 twice a day   He is taking Rivaroxaban   Following cardioversion he shortly thereafter reverted to atrial fibrillation. However, in the last 3 weeks he has spontaneously converted to sinus rhythm. It is his impression that he has more energy. It may be less dyspnea. And less fatigue. His   Past Medical History  Diagnosis Date  . Orthostatic lightheadedness   . History of subdural hematoma     LEFT CHRONIC SUBDURAL HEMATOMA   S/P PARIETAL CRANIOTOMY WITH EVACUATION HEMATOMA  . Atrial fibrillation, persistent   . H/O cardiac radiofrequency ablation     A-FLUTTER  . Cardiac pacemaker last check 08-30-2013    05-05-2007  DD  [employer] GUIDANT INSIGNIA  . RBBB   . Benign essential tremor   . Diverticulosis of colon   . Tachy-brady syndrome   . SSS (sick sinus syndrome)   . Recurrent prostate carcinoma     2005  S/P RADIOACTIVE SEED IMPLANTS/   BLADDER NECK MASS RESECTED 05/ 2015  SHOWED RECURRENT PROSTATE CANCER  . Hydronephrosis, bilateral   . Renal insufficiency     Past Surgical History  Procedure Laterality Date  . Tonsillectomy  as child  . Subdural hematoma evacuation via craniotomy  12-16-2007  . Rotator cuff repair w/ distal clavicle excision Left 08-31-2010  . Pacemaker insertion  05-05-2007      Guidant Insignia [employer])  . Total knee arthroplasty  12/18/2010    Procedure: TOTAL KNEE ARTHROPLASTY;  Surgeon: Mauri Pole;   Location: [facility];  Service: Orthopedics;  Laterality: Left;  . Cataract extraction    . Cystoscopy w/ ureteral stent placement Bilateral 05/30/2013    Procedure: CYSTOSCOPY WITH RETROGRADE PYELOGRAM/ BILATERAL URETERAL STENT PLACEMENT, TRANSURETRAL RESECTION OF BLADDER TUMOR WITH GYRUS;  Surgeon: Alexis Frock, MD;  Location: [facility];  Service: Urology;  Laterality: Bilateral;  . Cardioversion N/A 08/04/2013    Procedure: CARDIOVERSION;  Surgeon: Sanda Sorren Vallier, MD;  Location: [facility];  Service: Cardiovascular;  Laterality: N/A;  . Knee arthroscopy Left 02-11-2007  . Cardioversion  multiple--  last one 08-04-2013    SINCE 2008  . Cardiac electrophysiology mapping and ablation  04-06-2007  dr Caryl Comes  . Cardiac catheterization  12-22-2006      non-obstructive CAD / RCA 20%/  ef 50%/  a-fib  . Radioactive prostate seed implants  12-21-2003  . Orif left distal clavical fx  08-31-2010  . Colonoscopy w/ polypectomy  12-01-2008  . Transthoracic echocardiogram  05-31-2013    MILD LVH/  EF 65%/  MODERATE LAE/  TRIVIAL MR  &  TR  . Cystoscopy/retrograde/ureteroscopy Right 09/16/2013    Procedure: CYSTOSCOPY/RETROGRADE/URETEROSCOPIC STENT EXTRACTION/  bil retrograde pyelogram with inseertion of bilateral stents;  Surgeon: Jorja Loa, MD;  Location: [facility];  Service: Urology;  Laterality: Right;    Current Outpatient Prescriptions  Medication Sig Dispense Refill  . calcium citrate-vitamin D (CITRACAL+D) 315-200 MG-UNIT per tablet Take  1 tablet by mouth 2 (two) times daily.      . cephALEXin (KEFLEX) 500 MG capsule Take 1 capsule (500 mg total) by mouth 2 (two) times daily.  6 capsule  0  . Glucosamine-Chondroit-Vit C-Mn (GLUCOSAMINE 1500 COMPLEX) CAPS Take 1 capsule by mouth 2 (two) times daily.      . Inulin (FIBERCHOICE PO) Take 4 capsules by mouth daily.      Marland Kitchen levothyroxine (SYNTHROID, LEVOTHROID) 137 MCG tablet Take 137 mcg by mouth daily before breakfast.      .  Melatonin 3 MG TABS Take 3 mg by mouth at bedtime. Takes again @ 4 am as needed.      . Methylfol-Methylcob-Acetylcyst (CEREFOLIN NAC) 6-2-600 MG TABS Take one tablet by mouth one time daily  90 each  3  . Multiple Vitamin (MULTIVITAMIN WITH MINERALS) TABS tablet Take 1 tablet by mouth daily.      . Omega-3 Krill Oil 300 MG CAPS Take 300 mg by mouth daily.      . propranolol ER (INDERAL LA) 60 MG 24 hr capsule TAKE 1 CAPSULE (60 MG TOTAL) BY MOUTH DAILY.---   TAKES IN NOON      . rivaroxaban (XARELTO) 20 MG TABS tablet Take 1 tablet (20 mg total) by mouth daily with supper.  30 tablet  2  . solifenacin (VESICARE) 5 MG tablet Take 5 mg by mouth daily.      . tamsulosin (FLOMAX) 0.4 MG CAPS capsule Take 0.4 mg by mouth daily.       No current facility-administered medications for this visit.    No Known Allergies  Review of Systems negative except from HPI and PMH  Physical Exam BP 110/52  Pulse 60  Ht 6' (1.829 m)  Wt 167 lb 9.6 oz (76.023 kg)  BMI 22.73 kg/m2 Well developed and well nourished in no acute distress HENT normal E scleral and icterus clear Neck Supple JVP flat; carotids brisk and full Clear to ausculation  Regular rate and rhythm, no murmurs gallops or rub Soft with active bowel sounds No clubbing cyanosis  Edema Alert and oriented, grossly normal motor and sensory function Skin Warm and Dry    Assessment and  Plan  Atrial fibrillaton  Bradycardia   Pacemaker [employer] The patient's device was interrogated.  The information was reviewed. No changes were made in the programming.     Subdural hematoma  He remains in sinus rhythm. Put him on propafenone in the hopes that this will help maintain sinus rhythm.\\  We'll plan to see her again in 4 months time.    We will plan to continue his anticoagulation for now; will reach out to Dr. Leonie Man and ask  his thoughts  regarding discontinuation

## 2013-09-22 NOTE — Patient Instructions (Signed)
Your physician has recommended you make the following change in your medication:  1) START Propafenone 225 mg twice daily  Your physician recommends that you schedule a follow-up appointment in: 4 months with Dr. Caryl Comes.

## 2013-09-22 NOTE — Anesthesia Postprocedure Evaluation (Signed)
  Anesthesia Post-op Note  Patient: [name]  Procedure(s) Performed: Procedure(s) (LRB): CYSTOSCOPY/RETROGRADE/URETEROSCOPIC STENT EXTRACTION/  bil retrograde pyelogram with inseertion of bilateral stents (Right)  Patient Location: PACU  Anesthesia Type: General  Level of Consciousness: awake and alert   Airway and Oxygen Therapy: Patient Spontanous Breathing  Post-op Pain: mild  Post-op Assessment: Post-op Vital signs reviewed, Patient's Cardiovascular Status Stable, Respiratory Function Stable, Patent Airway and No signs of Nausea or vomiting  Last Vitals:  Filed Vitals:   09/16/13 1214  BP: 161/93  Pulse: 72  Temp: 36 C  Resp: 18    Post-op Vital Signs: stable   Complications: No apparent anesthesia complications

## 2013-09-28 ENCOUNTER — Telehealth: Payer: Self-pay | Admitting: Internal Medicine

## 2013-09-28 NOTE — Telephone Encounter (Signed)
Follow up     FYI Pt found out he does not need to drop off any more forms.  He will not be dropping off more forms.

## 2013-09-28 NOTE — Telephone Encounter (Signed)
New message    Patient calling stating he receive more forms from patients assistance. Will be dropping off today.

## 2013-10-04 ENCOUNTER — Telehealth: Payer: Self-pay | Admitting: Internal Medicine

## 2013-10-04 NOTE — Telephone Encounter (Signed)
New problem    Pt stated there is a problem with his Xarelto. Please call pt.

## 2013-10-04 NOTE — Telephone Encounter (Signed)
He cannot afford any of these new NOACs, and can't get help because he had part D coverage. He would like to return to taking Coumadin. Informed him that I would discuss with Coumadin clinic and someone would call him to arrange re-establish visit. He is agreeable to this plan.

## 2013-10-04 NOTE — Telephone Encounter (Signed)
Patient has not been followed in our Coumadin clinic. I called and he was being followed by clinic w/ Dr. Felipa Eth. He will call them to re-establish. He will call me if needs a few more days of samples to get through until he can re-establish.

## 2013-10-14 ENCOUNTER — Encounter: Payer: Self-pay | Admitting: Internal Medicine

## 2013-10-20 ENCOUNTER — Encounter: Payer: Self-pay | Admitting: *Deleted

## 2013-10-20 NOTE — Telephone Encounter (Signed)
Called patient secondary to receiving staff message from Dr. Caryl Comes. (message was Dr. Caryl Comes spoke with Dr. Leonie Man, consensus was to use anticoagulation and to inform patient of this). Patient is currently taking Xarelto. He states to have a lot of samples to take for now. He will contact Dr. Felipa Eth to inform him of this, and will arrange to change to Coumadin if he is unable to remain on Xarelto secondary to cost issues.

## 2013-10-20 NOTE — Telephone Encounter (Signed)
This encounter was created in error - please disregard.

## 2013-11-05 DIAGNOSIS — I4892 Unspecified atrial flutter: Secondary | ICD-10-CM

## 2013-11-10 ENCOUNTER — Ambulatory Visit
Admission: RE | Admit: 2013-11-10 | Discharge: 2013-11-10 | Disposition: A | Discharge status: Home / Self Care | Payer: Medicare Other | Source: Ambulatory Visit | Attending: Geriatric Medicine | Admitting: Geriatric Medicine

## 2013-11-10 ENCOUNTER — Other Ambulatory Visit: Payer: Self-pay | Admitting: Geriatric Medicine

## 2013-11-10 DIAGNOSIS — M542 Cervicalgia: Secondary | ICD-10-CM

## 2013-11-29 ENCOUNTER — Telehealth: Payer: Self-pay | Admitting: Internal Medicine

## 2013-11-29 NOTE — Telephone Encounter (Deleted)
Error

## 2013-11-30 ENCOUNTER — Telehealth: Payer: Self-pay | Admitting: *Deleted

## 2013-11-30 NOTE — Telephone Encounter (Signed)
Xarelto samples placed at the front desk for patient. 

## 2013-12-03 ENCOUNTER — Other Ambulatory Visit: Payer: Self-pay | Admitting: Internal Medicine

## 2013-12-13 NOTE — Telephone Encounter (Signed)
New message      Want samples of xeralto-----pt does not know the milligram he takes

## 2013-12-14 ENCOUNTER — Telehealth: Payer: Self-pay

## 2013-12-14 NOTE — Telephone Encounter (Signed)
spoke with patient about picking up samples, left a month supply of samples up front

## 2013-12-28 ENCOUNTER — Telehealth: Payer: Self-pay | Admitting: Oncology

## 2013-12-28 NOTE — Telephone Encounter (Signed)
LEFT MESSAGE FOR PATIENT TO RETURN CALL TO SCHEDULE NP APPT.  °

## 2014-01-13 ENCOUNTER — Telehealth: Payer: Self-pay | Admitting: Oncology

## 2014-01-13 NOTE — Telephone Encounter (Signed)
LEFT MESSAGE FOR PATIENT TO RETURN CALL TO SCHEDULE NP APPT [phone], [phone]

## 2014-01-27 ENCOUNTER — Ambulatory Visit (HOSPITAL_BASED_OUTPATIENT_CLINIC_OR_DEPARTMENT_OTHER): Payer: PPO

## 2014-01-27 ENCOUNTER — Other Ambulatory Visit: Payer: Medicare Other

## 2014-01-27 ENCOUNTER — Ambulatory Visit: Payer: PPO

## 2014-01-27 ENCOUNTER — Telehealth: Payer: Self-pay | Admitting: Oncology

## 2014-01-27 ENCOUNTER — Ambulatory Visit (HOSPITAL_BASED_OUTPATIENT_CLINIC_OR_DEPARTMENT_OTHER): Payer: PPO | Admitting: Oncology

## 2014-01-27 VITALS — BP 100/49 | HR 98 | Temp 97.6°F | Resp 18 | Ht 72.0 in | Wt 173.3 lb

## 2014-01-27 DIAGNOSIS — C61 Malignant neoplasm of prostate: Secondary | ICD-10-CM | POA: Diagnosis not present

## 2014-01-27 DIAGNOSIS — I4891 Unspecified atrial fibrillation: Secondary | ICD-10-CM

## 2014-01-27 DIAGNOSIS — D649 Anemia, unspecified: Secondary | ICD-10-CM | POA: Diagnosis not present

## 2014-01-27 DIAGNOSIS — N289 Disorder of kidney and ureter, unspecified: Secondary | ICD-10-CM

## 2014-01-27 LAB — CBC WITH DIFFERENTIAL/PLATELET
BASO%: 0.6 % (ref 0.0–2.0)
Basophils Absolute: 0 10*3/uL (ref 0.0–0.1)
EOS%: 0.9 % (ref 0.0–7.0)
Eosinophils Absolute: 0.1 10*3/uL (ref 0.0–0.5)
HCT: 30.4 % — ABNORMAL LOW (ref 38.4–49.9)
HEMOGLOBIN: 9.6 g/dL — AB (ref 13.0–17.1)
LYMPH#: 1 10*3/uL (ref 0.9–3.3)
LYMPH%: 12.2 % — AB (ref 14.0–49.0)
MCH: 27.5 pg (ref 27.2–33.4)
MCHC: 31.6 g/dL — ABNORMAL LOW (ref 32.0–36.0)
MCV: 87.3 fL (ref 79.3–98.0)
MONO#: 0.5 10*3/uL (ref 0.1–0.9)
MONO%: 6.5 % (ref 0.0–14.0)
NEUT%: 79.8 % — ABNORMAL HIGH (ref 39.0–75.0)
NEUTROS ABS: 6.6 10*3/uL — AB (ref 1.5–6.5)
PLATELETS: 196 10*3/uL (ref 140–400)
RBC: 3.49 10*6/uL — AB (ref 4.20–5.82)
RDW: 15.1 % — AB (ref 11.0–14.6)
WBC: 8.3 10*3/uL (ref 4.0–10.3)

## 2014-01-27 LAB — COMPREHENSIVE METABOLIC PANEL (CC13)
ALBUMIN: 3.8 g/dL (ref 3.5–5.0)
ALT: 8 U/L (ref 0–55)
AST: 15 U/L (ref 5–34)
Alkaline Phosphatase: 71 U/L (ref 40–150)
Anion Gap: 9 mEq/L (ref 3–11)
BILIRUBIN TOTAL: 0.38 mg/dL (ref 0.20–1.20)
BUN: 35.5 mg/dL — ABNORMAL HIGH (ref 7.0–26.0)
CHLORIDE: 107 meq/L (ref 98–109)
CO2: 21 mEq/L — ABNORMAL LOW (ref 22–29)
Calcium: 9.1 mg/dL (ref 8.4–10.4)
Creatinine: 1.7 mg/dL — ABNORMAL HIGH (ref 0.7–1.3)
EGFR: 38 mL/min/{1.73_m2} — ABNORMAL LOW (ref >90)
Glucose: 106 mg/dl (ref 70–140)
POTASSIUM: 4.2 meq/L (ref 3.5–5.1)
SODIUM: 137 meq/L (ref 136–145)
Total Protein: 6.8 g/dL (ref 6.4–8.3)

## 2014-01-27 NOTE — Progress Notes (Signed)
Please see consult note.  

## 2014-01-27 NOTE — Consult Note (Signed)
Reason for Referral: Anemia.   HPI: 81 year old gentleman currently of [place] where he lived the majority of his life. He is going with her comorbid conditions include prostate cancer, cardiac arrhythmia and chronic renal insufficiency. His prostate cancer dates back to 2005 where he had a Gleason score 3+3 = 6. His PSA was 4.4. He was treated with brachytherapy and seed implants and 12/21/2003. His PSA did rise up to 2.89 and found to have local recurrence of his prostate cancer into the bladder neck. He underwent a double J stent placement and his staging workup did not show any widespread metastasis. He was treated with androgen deprivation with Mills Koller and his PSA dropped down to 0.93. He is currently receiving Lupron every 4 months under the care of Dr. Diona Fanti. There is also noted he developed progressive anemia noted by his primary care provider. His hemoglobin in December 2015 was 9.6 with otherwise account of 6.2 MCV of 89 and platelet count of 225. Previously his hemoglobin have ranged between 9.9 and 11.2 in the last 6 months. His iron studies in April 2015 were normal he also had a normal serum protein electrophoresis. I was asked to comment on his anemia. Clinically, he does not report any active bleeding at this time. He does not report any hematochezia or melena. He is no longer reporting hematuria. He does report urinary symptoms including pelvic pain and dysuria. He does not report any arthralgias or myalgias. He does report fatigue and tiredness and exertional dyspnea. But still able to perform activities of daily living. He is able to drive and attends to his affairs without any decline. He does not report any headaches, blurry vision, syncope or seizures. He does not report any fevers, chills, sweats does report weight loss and decline in his appetite. He does not report any chest pain, palpitation, orthopnea or PND. He does not report any leg edema, cough, shortness of breath,  difficulty breathing but does report occasional dyspnea on exertion. He does not report any nausea, vomiting, constipation or diarrhea. He does not report any frequency urgency or hesitancy. He does report incontinence. He does not report any mood issues such as anxiety or depression. Rest of his review of systems unremarkable.   Past Medical History  Diagnosis Date  . Orthostatic lightheadedness   . History of subdural hematoma     LEFT CHRONIC SUBDURAL HEMATOMA   S/P PARIETAL CRANIOTOMY WITH EVACUATION HEMATOMA  . Atrial fibrillation, persistent   . H/O cardiac radiofrequency ablation     A-FLUTTER  . Cardiac pacemaker last check 08-30-2013    05-05-2007  DD  MEDTRONIC GUIDANT INSIGNIA  . RBBB   . Benign essential tremor   . Diverticulosis of colon   . Tachy-brady syndrome   . SSS (sick sinus syndrome)   . Recurrent prostate carcinoma     2005  S/P RADIOACTIVE SEED IMPLANTS/   BLADDER NECK MASS RESECTED 05/ 2015  SHOWED RECURRENT PROSTATE CANCER  . Hydronephrosis, bilateral   . Renal insufficiency   :  Past Surgical History  Procedure Laterality Date  . Tonsillectomy  as child  . Subdural hematoma evacuation via craniotomy  12-16-2007  . Rotator cuff repair w/ distal clavicle excision Left 08-31-2010  . Pacemaker insertion  05-05-2007      Guidant [employer])  . Total knee arthroplasty  12/18/2010    Procedure: TOTAL KNEE ARTHROPLASTY;  Surgeon: Mauri Pole;  Location: [facility];  Service: Orthopedics;  Laterality: Left;  . Cataract extraction    .  Cystoscopy w/ ureteral stent placement Bilateral 05/30/2013    Procedure: CYSTOSCOPY WITH RETROGRADE PYELOGRAM/ BILATERAL URETERAL STENT PLACEMENT, TRANSURETRAL RESECTION OF BLADDER TUMOR WITH GYRUS;  Surgeon: Alexis Frock, MD;  Location: [facility];  Service: Urology;  Laterality: Bilateral;  . Cardioversion N/A 08/04/2013    Procedure: CARDIOVERSION;  Surgeon: Sanda Klein, MD;  Location: [facility];  Service: Cardiovascular;   Laterality: N/A;  . Knee arthroscopy Left 02-11-2007  . Cardioversion  multiple--  last one 08-04-2013    SINCE 2008  . Cardiac electrophysiology mapping and ablation  04-06-2007  dr Caryl Comes  . Cardiac catheterization  12-22-2006      non-obstructive CAD / RCA 20%/  ef 50%/  a-fib  . Radioactive prostate seed implants  12-21-2003  . Orif left distal clavical fx  08-31-2010  . Colonoscopy w/ polypectomy  12-01-2008  . Transthoracic echocardiogram  05-31-2013    MILD LVH/  EF 65%/  MODERATE LAE/  TRIVIAL MR  &  TR  . Cystoscopy/retrograde/ureteroscopy Right 09/16/2013    Procedure: CYSTOSCOPY/RETROGRADE/URETEROSCOPIC STENT EXTRACTION/  bil retrograde pyelogram with inseertion of bilateral stents;  Surgeon: Jorja Loa, MD;  Location: [facility];  Service: Urology;  Laterality: Right;  :   Current outpatient prescriptions:  .  calcium citrate-vitamin D (CITRACAL+D) 315-200 MG-UNIT per tablet, Take 1 tablet by mouth 2 (two) times daily., Disp: , Rfl:  .  Glucosamine-Chondroit-Vit C-Mn (GLUCOSAMINE 1500 COMPLEX) CAPS, Take 1 capsule by mouth 2 (two) times daily., Disp: , Rfl:  .  Inulin (FIBERCHOICE PO), Take 4 capsules by mouth daily., Disp: , Rfl:  .  levothyroxine (SYNTHROID, LEVOTHROID) 137 MCG tablet, Take 137 mcg by mouth daily before breakfast., Disp: , Rfl:  .  Melatonin 3 MG TABS, Take 3 mg by mouth at bedtime. Takes again @ 4 am as needed., Disp: , Rfl:  .  Methylfol-Methylcob-Acetylcyst (CEREFOLIN NAC) 6-2-600 MG TABS, Take one tablet by mouth one time daily, Disp: 90 each, Rfl: 3 .  Multiple Vitamin (MULTIVITAMIN WITH MINERALS) TABS tablet, Take 1 tablet by mouth daily., Disp: , Rfl:  .  Omega-3 Krill Oil 300 MG CAPS, Take 300 mg by mouth daily., Disp: , Rfl:  .  propafenone (RYTHMOL) 225 MG tablet, Take 1 tablet (225 mg total) by mouth 2 (two) times daily., Disp: 60 tablet, Rfl: 4 .  propranolol ER (INDERAL LA) 60 MG 24 hr capsule, TAKE 1 CAPSULE (60 MG TOTAL)  BY MOUTH DAILY.---   TAKES IN NOON, Disp: , Rfl:  .  rivaroxaban (XARELTO) 20 MG TABS tablet, Take 1 tablet (20 mg total) by mouth daily with supper., Disp: 30 tablet, Rfl: 2 .  solifenacin (VESICARE) 5 MG tablet, Take 5 mg by mouth daily., Disp: , Rfl:  .  tamsulosin (FLOMAX) 0.4 MG CAPS capsule, Take 0.4 mg by mouth daily., Disp: , Rfl: :  No Known Allergies:  Family History  Problem Relation Age of Onset  . Aneurysm Father   . Stroke Father   :  History   Social History  . Marital Status: Married    Spouse Name: margaret    Number of Children: 2  . Years of Education: BA   Occupational History  . retired    Social History Main Topics  . Smoking status: Never Smoker   . Smokeless tobacco: Never Used  . Alcohol Use: 7.7 oz/week    7 Glasses of wine, 7 Not specified per week     Comment: 2 glasses wine day  .  Drug Use: No  . Sexual Activity: Not on file   Other Topics Concern  . Not on file   Social History Narrative   Patient lives at home with his spouse.   Caffeine Use: 2 cups of coffee  :  Pertinent items are noted in HPI.  Exam: Blood pressure 100/49, pulse 98, temperature 97.6 F (36.4 C), temperature source Oral, resp. rate 18, height 6' (1.829 m), weight 173 lb 4.8 oz (78.608 kg), SpO2 100 %. General appearance: alert and cooperative Head: Normocephalic, without obvious abnormality Throat: lips, mucosa, and tongue normal; teeth and gums normal Neck: no adenopathy Back: negative Resp: clear to auscultation bilaterally Cardio: regular rate and rhythm, S1, S2 normal, no murmur, click, rub or gallop GI: soft, non-tender; bowel sounds normal; no masses,  no organomegaly Extremities: extremities normal, atraumatic, no cyanosis or edema Pulses: 2+ and symmetric Skin: Skin color, texture, turgor normal. No rashes or lesions Lymph nodes: Cervical, supraclavicular, and axillary nodes normal.  CBC    Component Value Date/Time   WBC 5.2 07/30/2013 1147   RBC  3.24* 07/30/2013 1147   RBC 2.57* 05/29/2013 0320   HGB 10.9* 09/16/2013 0852   HCT 32.0* 09/16/2013 0852   PLT 199.0 07/30/2013 1147   MCV 96.5 07/30/2013 1147   MCH 33.2 07/12/2013 0355   MCHC 34.2 07/30/2013 1147   RDW 13.1 07/30/2013 1147   LYMPHSABS 1.1 07/30/2013 1147   MONOABS 0.4 07/30/2013 1147   EOSABS 0.2 07/30/2013 1147   BASOSABS 0.0 07/30/2013 1147      Chemistry      Component Value Date/Time   NA 141 09/16/2013 0852   K 4.4 09/16/2013 0852   CL 105 09/16/2013 0852   CO2 28 07/30/2013 1147   BUN 30* 09/16/2013 0852   CREATININE 1.20 09/16/2013 0852      Component Value Date/Time   CALCIUM 9.4 07/30/2013 1147   ALKPHOS 61 07/12/2013 0355   AST 23 07/12/2013 0355   ALT 18 07/12/2013 0355   BILITOT 0.3 07/12/2013 0355       Assessment and Plan:   81 year old gentleman with the following issues:  1. Multifactorial anemia. His anemia is normocytic and have been fluctuating at least for the last 6 months. He did have an episode of hematuria back in May 2015 but no longer reporting any active bleeding. He does have renal insufficiency with a creatinine clearance of less than 50 mL/m. Differential diagnosis includes anemia of chronic disease related to malignancy, bone marrow suppression from the previous radiation therapy, anemia of renal disease given his chronic renal insufficiency, myelodysplastic syndrome as well as iron deficiency related to blood loss. The likelihood he has an element of a lot of these conditions. And it is reasonable to address reversible causes.  To work this up at this time I will repeat a erythropoietin level, serum protein electrophoresis as well as iron studies. I'll also repeat his creatinine at this point. Depending on these findings we can consider IV iron as well as growth factor support.  The risks and benefits of Aranesp was discussed today and I feel he would benefit if his erythropoietin level is low. Complications include  hypertension, cardiovascular disease among others were discussed with benefits include improving his hemoglobin to above 11 which will improve his quality of life and performance status. His agreeable to proceed pending the results of his blood tests.  If no clear-cut answer is identified, we can consider bone marrow biopsy to rule out myelodysplastic syndrome.  2. Prostate cancer: He did develop locally advanced hormone sensitive prostate cancer and currently on Lupron. His PSA and a reasonable control under the care of Dr.  Diona Fanti.. At this time no further intervention is recommended. If his PSA starts to rise salvage therapy might be needed.  3. Atrial fibrillation: He is currently in normal sinus rhythm but chronically anticoagulated on Xarelto which could be contributing to microscopic hematuria chronic blood loss. Iron studies will certainly help identify that.  4. Renal insufficiency: I will check his creatinine and GFR today to document these findings. Etiology is unclear at this time could be related to his previous cancer treatment.  Follow-up will be in 9 weeks after initiation of growth factor support sooner if needed to.

## 2014-01-27 NOTE — Telephone Encounter (Signed)
Gave avs & cal for Jan thru March.

## 2014-01-28 ENCOUNTER — Other Ambulatory Visit: Payer: Self-pay | Admitting: Oncology

## 2014-01-28 LAB — IRON AND TIBC CHCC
%SAT: 12 % — AB (ref 20–55)
Iron: 45 ug/dL (ref 42–163)
TIBC: 371 ug/dL (ref 202–409)
UIBC: 326 ug/dL (ref 117–376)

## 2014-01-28 LAB — FERRITIN CHCC: Ferritin: 10 ng/ml — ABNORMAL LOW (ref 22–316)

## 2014-01-28 NOTE — Progress Notes (Signed)
There is also laboratory testing were reviewed today and discussed with the patient. His hemoglobin is 9.6 with an MCV of 87.3. His ferritin level is down to 10 which is low with iron saturation of 12% which is low. I have recommended intravenous iron to go with Aranesp to help his boost his hemoglobin production. Risks and benefits of Feraheme infusion was discussed with the patient today. Complications include arthralgias and myalgias and possible infusion related reactions. Rarely anaphylaxis occurs and he agreed to proceed. We will set that up for him in the next week.

## 2014-01-31 ENCOUNTER — Telehealth: Payer: Self-pay | Admitting: *Deleted

## 2014-01-31 LAB — SPEP & IFE WITH QIG
ALBUMIN ELP: 58.6 % (ref 55.8–66.1)
ALPHA-1-GLOBULIN: 4.4 % (ref 2.9–4.9)
Alpha-2-Globulin: 10.5 % (ref 7.1–11.8)
BETA GLOBULIN: 6.8 % (ref 4.7–7.2)
Beta 2: 4.5 % (ref 3.2–6.5)
GAMMA GLOBULIN: 15.2 % (ref 11.1–18.8)
IgA: 193 mg/dL (ref 68–379)
IgG (Immunoglobin G), Serum: 1110 mg/dL (ref 650–1600)
IgM, Serum: 78 mg/dL (ref 41–251)
Total Protein, Serum Electrophoresis: 7 g/dL (ref 6.0–8.3)

## 2014-01-31 LAB — ERYTHROPOIETIN: Erythropoietin: 21.8 m[IU]/mL — ABNORMAL HIGH (ref 2.6–18.5)

## 2014-01-31 NOTE — Telephone Encounter (Signed)
Per staff message and POF I have scheduled appts. Advised scheduler of appts. JMW  

## 2014-02-01 ENCOUNTER — Other Ambulatory Visit: Payer: Self-pay | Admitting: Medical Oncology

## 2014-02-01 ENCOUNTER — Ambulatory Visit (HOSPITAL_BASED_OUTPATIENT_CLINIC_OR_DEPARTMENT_OTHER): Payer: PPO

## 2014-02-01 ENCOUNTER — Other Ambulatory Visit: Payer: PPO

## 2014-02-01 ENCOUNTER — Ambulatory Visit: Payer: PPO

## 2014-02-01 VITALS — BP 153/83 | HR 60 | Temp 98.1°F

## 2014-02-01 DIAGNOSIS — C61 Malignant neoplasm of prostate: Secondary | ICD-10-CM

## 2014-02-01 DIAGNOSIS — D631 Anemia in chronic kidney disease: Secondary | ICD-10-CM

## 2014-02-01 DIAGNOSIS — D649 Anemia, unspecified: Secondary | ICD-10-CM

## 2014-02-01 DIAGNOSIS — N189 Chronic kidney disease, unspecified: Secondary | ICD-10-CM

## 2014-02-01 DIAGNOSIS — N289 Disorder of kidney and ureter, unspecified: Secondary | ICD-10-CM

## 2014-02-01 DIAGNOSIS — N178 Other acute kidney failure: Secondary | ICD-10-CM

## 2014-02-01 LAB — CBC WITH DIFFERENTIAL/PLATELET
BASO%: 0.5 % (ref 0.0–2.0)
Basophils Absolute: 0 10*3/uL (ref 0.0–0.1)
EOS%: 2 % (ref 0.0–7.0)
Eosinophils Absolute: 0.1 10*3/uL (ref 0.0–0.5)
HCT: 27.7 % — ABNORMAL LOW (ref 38.4–49.9)
HEMOGLOBIN: 8.6 g/dL — AB (ref 13.0–17.1)
LYMPH%: 19.4 % (ref 14.0–49.0)
MCH: 27.1 pg — ABNORMAL LOW (ref 27.2–33.4)
MCHC: 31.1 g/dL — ABNORMAL LOW (ref 32.0–36.0)
MCV: 87.1 fL (ref 79.3–98.0)
MONO#: 0.6 10*3/uL (ref 0.1–0.9)
MONO%: 11.2 % (ref 0.0–14.0)
NEUT#: 3.7 10*3/uL (ref 1.5–6.5)
NEUT%: 66.9 % (ref 39.0–75.0)
Platelets: 218 10*3/uL (ref 140–400)
RBC: 3.18 10*6/uL — AB (ref 4.20–5.82)
RDW: 14.8 % — AB (ref 11.0–14.6)
WBC: 5.5 10*3/uL (ref 4.0–10.3)
lymph#: 1.1 10*3/uL (ref 0.9–3.3)

## 2014-02-01 MED ORDER — DARBEPOETIN ALFA 300 MCG/0.6ML IJ SOSY
300.0000 ug | PREFILLED_SYRINGE | Freq: Once | INTRAMUSCULAR | Status: AC
  Administered 2014-02-01: 300 ug via SUBCUTANEOUS
  Filled 2014-02-01: qty 0.6

## 2014-02-01 NOTE — Patient Instructions (Signed)
Darbepoetin Alfa injection What is this medicine? DARBEPOETIN ALFA (dar be POE e tin AL fa) helps your body make more red blood cells. It is used to treat anemia caused by chronic kidney failure and chemotherapy. This medicine may be used for other purposes; ask your health care provider or pharmacist if you have questions. COMMON BRAND NAME(S): Aranesp What should I tell my health care provider before I take this medicine? They need to know if you have any of these conditions: -blood clotting disorders or history of blood clots -cancer patient not on chemotherapy -cystic fibrosis -heart disease, such as angina, heart failure, or a history of a heart attack -hemoglobin level of 12 g/dL or greater -high blood pressure -low levels of folate, iron, or vitamin B12 -seizures -an unusual or allergic reaction to darbepoetin, erythropoietin, albumin, hamster proteins, latex, other medicines, foods, dyes, or preservatives -pregnant or trying to get pregnant -breast-feeding How should I use this medicine? This medicine is for injection into a vein or under the skin. It is usually given by a health care professional in a hospital or clinic setting. If you get this medicine at home, you will be taught how to prepare and give this medicine. Do not shake the solution before you withdraw a dose. Use exactly as directed. Take your medicine at regular intervals. Do not take your medicine more often than directed. It is important that you put your used needles and syringes in a special sharps container. Do not put them in a trash can. If you do not have a sharps container, call your pharmacist or healthcare provider to get one. Talk to your pediatrician regarding the use of this medicine in children. While this medicine may be used in children as young as 1 year for selected conditions, precautions do apply. Overdosage: If you think you have taken too much of this medicine contact a poison control center or  emergency room at once. NOTE: This medicine is only for you. Do not share this medicine with others. What if I miss a dose? If you miss a dose, take it as soon as you can. If it is almost time for your next dose, take only that dose. Do not take double or extra doses. What may interact with this medicine? Do not take this medicine with any of the following medications: -epoetin alfa This list may not describe all possible interactions. Give your health care provider a list of all the medicines, herbs, non-prescription drugs, or dietary supplements you use. Also tell them if you smoke, drink alcohol, or use illegal drugs. Some items may interact with your medicine. What should I watch for while using this medicine? Visit your prescriber or health care professional for regular checks on your progress and for the needed blood tests and blood pressure measurements. It is especially important for the doctor to make sure your hemoglobin level is in the desired range, to limit the risk of potential side effects and to give you the best benefit. Keep all appointments for any recommended tests. Check your blood pressure as directed. Ask your doctor what your blood pressure should be and when you should contact him or her. As your body makes more red blood cells, you may need to take iron, folic acid, or vitamin B supplements. Ask your doctor or health care provider which products are right for you. If you have kidney disease continue dietary restrictions, even though this medication can make you feel better. Talk with your doctor or health   care professional about the foods you eat and the vitamins that you take. What side effects may I notice from receiving this medicine? Side effects that you should report to your doctor or health care professional as soon as possible: -allergic reactions like skin rash, itching or hives, swelling of the face, lips, or tongue -breathing problems -changes in vision -chest  pain -confusion, trouble speaking or understanding -feeling faint or lightheaded, falls -high blood pressure -muscle aches or pains -pain, swelling, warmth in the leg -rapid weight gain -severe headaches -sudden numbness or weakness of the face, arm or leg -trouble walking, dizziness, loss of balance or coordination -seizures (convulsions) -swelling of the ankles, feet, hands -unusually weak or tired Side effects that usually do not require medical attention (report to your doctor or health care professional if they continue or are bothersome): -diarrhea -fever, chills (flu-like symptoms) -headaches -nausea, vomiting -redness, stinging, or swelling at site where injected This list may not describe all possible side effects. Call your doctor for medical advice about side effects. You may report side effects to FDA at 1-800-FDA-1088. Where should I keep my medicine? Keep out of the reach of children. Store in a refrigerator between 2 and 8 degrees C (36 and 46 degrees F). Do not freeze. Do not shake. Throw away any unused portion if using a single-dose vial. Throw away any unused medicine after the expiration date. NOTE: This sheet is a summary. It may not cover all possible information. If you have questions about this medicine, talk to your doctor, pharmacist, or health care provider.  2015, Elsevier/Gold Standard. (2007-12-15 10:23:57)  

## 2014-02-03 ENCOUNTER — Encounter: Payer: Self-pay | Admitting: Internal Medicine

## 2014-02-03 ENCOUNTER — Ambulatory Visit (INDEPENDENT_AMBULATORY_CARE_PROVIDER_SITE_OTHER): Payer: PPO | Admitting: Internal Medicine

## 2014-02-03 VITALS — BP 112/68 | HR 60 | Ht 72.0 in | Wt 173.8 lb

## 2014-02-03 DIAGNOSIS — I480 Paroxysmal atrial fibrillation: Secondary | ICD-10-CM | POA: Diagnosis not present

## 2014-02-03 DIAGNOSIS — I495 Sick sinus syndrome: Secondary | ICD-10-CM

## 2014-02-03 DIAGNOSIS — Z45018 Encounter for adjustment and management of other part of cardiac pacemaker: Secondary | ICD-10-CM

## 2014-02-03 LAB — MDC_IDC_ENUM_SESS_TYPE_INCLINIC
Brady Statistic RV Percent Paced: 95 %
Date Time Interrogation Session: 20160121050000
Implantable Pulse Generator Serial Number: 319544
Lead Channel Impedance Value: 380 Ohm
Lead Channel Impedance Value: 460 Ohm
Lead Channel Pacing Threshold Amplitude: 0.6 V
Lead Channel Sensing Intrinsic Amplitude: 2.5 mV
Lead Channel Sensing Intrinsic Amplitude: 6.8 mV
Lead Channel Setting Pacing Amplitude: 2.4 V
Lead Channel Setting Pacing Pulse Width: 0.4 ms
Lead Channel Setting Sensing Sensitivity: 2.5 mV
MDC IDC MSMT LEADCHNL RA PACING THRESHOLD AMPLITUDE: 0.7 V
MDC IDC MSMT LEADCHNL RA PACING THRESHOLD PULSEWIDTH: 0.4 ms
MDC IDC MSMT LEADCHNL RV PACING THRESHOLD PULSEWIDTH: 0.4 ms
MDC IDC SET LEADCHNL RA PACING AMPLITUDE: 2 V
MDC IDC STAT BRADY RA PERCENT PACED: 95 %

## 2014-02-03 NOTE — Patient Instructions (Signed)

## 2014-02-03 NOTE — Progress Notes (Signed)
Patient Care Team: Lajean Manes, MD as PCP - General (Internal Medicine)   HPI  [name] is a 81 y.o. male  Seen in followup for atrial fibrillation for which he was placed on flecainide. He was seen a few weeks ago and was found to have a broad complex rhythm that was concerning for flecainide toxicity. There was an atrial tachycardia that Dr. Lovena Le successfully pace terminated.  It was elected to continue his flecainide. The device was reprogrammed to detect slower arrhythmias. The flecainide dose was decreased from 100-50 twice a day   He is taking Rivaroxaban   He has been holding sinus rhythm. The interim has been notable for the development of anemia as well as significant bladder problems which have required bilateral stenting and repeat stenting. He's also been noted to have an anemia the cause of which is not clear. He is to start therapy for this. He is not at all clear as to whether his stool has been checked for blood    Past Medical History  Diagnosis Date  . Orthostatic lightheadedness   . History of subdural hematoma     LEFT CHRONIC SUBDURAL HEMATOMA   S/P PARIETAL CRANIOTOMY WITH EVACUATION HEMATOMA  . Atrial fibrillation, persistent   . H/O cardiac radiofrequency ablation     A-FLUTTER  . Cardiac pacemaker last check 08-30-2013    05-05-2007  DD  MEDTRONIC GUIDANT INSIGNIA  . RBBB   . Benign essential tremor   . Diverticulosis of colon   . Tachy-brady syndrome   . SSS (sick sinus syndrome)   . Recurrent prostate carcinoma     2005  S/P RADIOACTIVE SEED IMPLANTS/   BLADDER NECK MASS RESECTED 05/ 2015  SHOWED RECURRENT PROSTATE CANCER  . Hydronephrosis, bilateral   . Renal insufficiency     Past Surgical History  Procedure Laterality Date  . Tonsillectomy  as child  . Subdural hematoma evacuation via craniotomy  12-16-2007  . Rotator cuff repair w/ distal clavicle excision Left 08-31-2010  . Pacemaker insertion  05-05-2007      Guidant  [employer])  . Total knee arthroplasty  12/18/2010    Procedure: TOTAL KNEE ARTHROPLASTY;  Surgeon: Mauri Pole;  Location: [facility];  Service: Orthopedics;  Laterality: Left;  . Cataract extraction    . Cystoscopy w/ ureteral stent placement Bilateral 05/30/2013    Procedure: CYSTOSCOPY WITH RETROGRADE PYELOGRAM/ BILATERAL URETERAL STENT PLACEMENT, TRANSURETRAL RESECTION OF BLADDER TUMOR WITH GYRUS;  Surgeon: Alexis Frock, MD;  Location: [facility];  Service: Urology;  Laterality: Bilateral;  . Cardioversion N/A 08/04/2013    Procedure: CARDIOVERSION;  Surgeon: Sanda Kiylee Thoreson, MD;  Location: [facility];  Service: Cardiovascular;  Laterality: N/A;  . Knee arthroscopy Left 02-11-2007  . Cardioversion  multiple--  last one 08-04-2013    SINCE 2008  . Cardiac electrophysiology mapping and ablation  04-06-2007  dr Caryl Comes  . Cardiac catheterization  12-22-2006      non-obstructive CAD / RCA 20%/  ef 50%/  a-fib  . Radioactive prostate seed implants  12-21-2003  . Orif left distal clavical fx  08-31-2010  . Colonoscopy w/ polypectomy  12-01-2008  . Transthoracic echocardiogram  05-31-2013    MILD LVH/  EF 65%/  MODERATE LAE/  TRIVIAL MR  &  TR  . Cystoscopy/retrograde/ureteroscopy Right 09/16/2013    Procedure: CYSTOSCOPY/RETROGRADE/URETEROSCOPIC STENT EXTRACTION/  bil retrograde pyelogram with inseertion of bilateral stents;  Surgeon: Jorja Loa, MD;  Location: [facility];  Service: Urology;  Laterality: Right;    Current Outpatient Prescriptions  Medication Sig Dispense Refill  . calcium citrate-vitamin D (CITRACAL+D) 315-200 MG-UNIT per tablet Take 1 tablet by mouth 2 (two) times daily.    . Glucosamine-Chondroit-Vit C-Mn (GLUCOSAMINE 1500 COMPLEX) CAPS Take 1 capsule by mouth 2 (two) times daily.    Marland Kitchen levothyroxine (SYNTHROID, LEVOTHROID) 137 MCG tablet Take 137 mcg by mouth daily before breakfast.    . megestrol (MEGACE) 20 MG tablet Take 20 mg by mouth 2 (two)  times daily.    . Melatonin 3 MG TABS Take 3 mg by mouth at bedtime. Takes again @ 4 am as needed.    . Methylfol-Methylcob-Acetylcyst (CEREFOLIN NAC) 6-2-600 MG TABS Take one tablet by mouth one time daily 90 each 3  . Multiple Vitamin (MULTIVITAMIN WITH MINERALS) TABS tablet Take 1 tablet by mouth daily.    . nitrofurantoin, macrocrystal-monohydrate, (MACROBID) 100 MG capsule Take 100 mg by mouth 2 (two) times daily.  0  . Omega-3 Krill Oil 300 MG CAPS Take 300 mg by mouth daily.    . propafenone (RYTHMOL) 225 MG tablet Take 1 tablet (225 mg total) by mouth 2 (two) times daily. 60 tablet 4  . propranolol ER (INDERAL LA) 60 MG 24 hr capsule TAKE 1 CAPSULE (60 MG TOTAL) BY MOUTH DAILY.---   TAKES IN NOON    . rivaroxaban (XARELTO) 20 MG TABS tablet Take 1 tablet (20 mg total) by mouth daily with supper. 30 tablet 2  . solifenacin (VESICARE) 5 MG tablet Take 5 mg by mouth daily.    . tamsulosin (FLOMAX) 0.4 MG CAPS capsule Take 0.4 mg by mouth daily.     No current facility-administered medications for this visit.    No Known Allergies  Review of Systems negative except from HPI and PMH  Physical Exam BP 112/68 mmHg  Pulse 60  Ht 6' (1.829 m)  Wt 173 lb 12.8 oz (78.835 kg)  BMI 23.57 kg/m2 Well developed and well nourished in no acute distress HENT normal E scleral and icterus clear Neck Supple JVP flat; carotids brisk and full Clear to ausculation  Regular rate and rhythm, no murmurs gallops or rub Soft with active bowel sounds No clubbing cyanosis  Edema Alert and oriented, grossly normal motor and sensory function Skin Warm and Dry    Assessment and  Plan  Atrial fibrillaton  Bradycardia   Pacemaker [employer] The patient's device was interrogated.  The information was reviewed. No changes were made in the programming.     Subdural hematoma  Anemia  Have called his PCP to query re guaiac positive stool as the cause of anemia  We will continue current  meds

## 2014-02-04 DIAGNOSIS — I4892 Unspecified atrial flutter: Secondary | ICD-10-CM

## 2014-02-07 ENCOUNTER — Telehealth: Payer: Self-pay | Admitting: Internal Medicine

## 2014-02-07 NOTE — Telephone Encounter (Signed)
New message    Patient calling    Sample of xarelto 15 mg.     [facility] [address].

## 2014-02-08 ENCOUNTER — Ambulatory Visit (HOSPITAL_BASED_OUTPATIENT_CLINIC_OR_DEPARTMENT_OTHER): Payer: PPO

## 2014-02-08 DIAGNOSIS — D500 Iron deficiency anemia secondary to blood loss (chronic): Secondary | ICD-10-CM

## 2014-02-08 DIAGNOSIS — N289 Disorder of kidney and ureter, unspecified: Secondary | ICD-10-CM

## 2014-02-08 DIAGNOSIS — D649 Anemia, unspecified: Secondary | ICD-10-CM

## 2014-02-08 DIAGNOSIS — N179 Acute kidney failure, unspecified: Secondary | ICD-10-CM

## 2014-02-08 MED ORDER — SODIUM CHLORIDE 0.9 % IV SOLN
510.0000 mg | Freq: Once | INTRAVENOUS | Status: AC
  Administered 2014-02-08: 510 mg via INTRAVENOUS
  Filled 2014-02-08: qty 17

## 2014-02-08 MED ORDER — SODIUM CHLORIDE 0.9 % IV SOLN
Freq: Once | INTRAVENOUS | Status: AC
  Administered 2014-02-08: 14:00:00 via INTRAVENOUS

## 2014-02-08 NOTE — Patient Instructions (Signed)

## 2014-02-09 ENCOUNTER — Telehealth: Payer: Self-pay

## 2014-02-09 NOTE — Telephone Encounter (Signed)
Called patient to let him know that I placed samples of xarelto 15 mg at the front desk for him

## 2014-02-22 ENCOUNTER — Other Ambulatory Visit (HOSPITAL_BASED_OUTPATIENT_CLINIC_OR_DEPARTMENT_OTHER): Payer: PPO

## 2014-02-22 ENCOUNTER — Ambulatory Visit (HOSPITAL_BASED_OUTPATIENT_CLINIC_OR_DEPARTMENT_OTHER): Payer: PPO

## 2014-02-22 DIAGNOSIS — N189 Chronic kidney disease, unspecified: Secondary | ICD-10-CM

## 2014-02-22 DIAGNOSIS — D649 Anemia, unspecified: Secondary | ICD-10-CM

## 2014-02-22 DIAGNOSIS — D631 Anemia in chronic kidney disease: Secondary | ICD-10-CM

## 2014-02-22 DIAGNOSIS — C61 Malignant neoplasm of prostate: Secondary | ICD-10-CM

## 2014-02-22 DIAGNOSIS — N178 Other acute kidney failure: Secondary | ICD-10-CM

## 2014-02-22 LAB — CBC WITH DIFFERENTIAL/PLATELET
BASO%: 0.2 % (ref 0.0–2.0)
Basophils Absolute: 0 10*3/uL (ref 0.0–0.1)
EOS%: 3.3 % (ref 0.0–7.0)
Eosinophils Absolute: 0.2 10*3/uL (ref 0.0–0.5)
HEMATOCRIT: 32.8 % — AB (ref 38.4–49.9)
HEMOGLOBIN: 10.5 g/dL — AB (ref 13.0–17.1)
LYMPH#: 1.5 10*3/uL (ref 0.9–3.3)
LYMPH%: 25.1 % (ref 14.0–49.0)
MCH: 29.2 pg (ref 27.2–33.4)
MCHC: 32 g/dL (ref 32.0–36.0)
MCV: 91.4 fL (ref 79.3–98.0)
MONO#: 0.4 10*3/uL (ref 0.1–0.9)
MONO%: 7.1 % (ref 0.0–14.0)
NEUT#: 3.7 10*3/uL (ref 1.5–6.5)
NEUT%: 64.3 % (ref 39.0–75.0)
Platelets: 190 10*3/uL (ref 140–400)
RBC: 3.59 10*6/uL — ABNORMAL LOW (ref 4.20–5.82)
RDW: 18.2 % — AB (ref 11.0–14.6)
WBC: 5.8 10*3/uL (ref 4.0–10.3)

## 2014-02-22 MED ORDER — DARBEPOETIN ALFA 300 MCG/0.6ML IJ SOSY
300.0000 ug | PREFILLED_SYRINGE | Freq: Once | INTRAMUSCULAR | Status: AC
  Administered 2014-02-22: 300 ug via SUBCUTANEOUS
  Filled 2014-02-22: qty 0.6

## 2014-02-22 NOTE — Patient Instructions (Signed)
Darbepoetin Alfa injection What is this medicine? DARBEPOETIN ALFA (dar be POE e tin AL fa) helps your body make more red blood cells. It is used to treat anemia caused by chronic kidney failure and chemotherapy. This medicine may be used for other purposes; ask your health care provider or pharmacist if you have questions. COMMON BRAND NAME(S): Aranesp What should I tell my health care provider before I take this medicine? They need to know if you have any of these conditions: -blood clotting disorders or history of blood clots -cancer patient not on chemotherapy -cystic fibrosis -heart disease, such as angina, heart failure, or a history of a heart attack -hemoglobin level of 12 g/dL or greater -high blood pressure -low levels of folate, iron, or vitamin B12 -seizures -an unusual or allergic reaction to darbepoetin, erythropoietin, albumin, hamster proteins, latex, other medicines, foods, dyes, or preservatives -pregnant or trying to get pregnant -breast-feeding How should I use this medicine? This medicine is for injection into a vein or under the skin. It is usually given by a health care professional in a hospital or clinic setting. If you get this medicine at home, you will be taught how to prepare and give this medicine. Do not shake the solution before you withdraw a dose. Use exactly as directed. Take your medicine at regular intervals. Do not take your medicine more often than directed. It is important that you put your used needles and syringes in a special sharps container. Do not put them in a trash can. If you do not have a sharps container, call your pharmacist or healthcare provider to get one. Talk to your pediatrician regarding the use of this medicine in children. While this medicine may be used in children as young as 1 year for selected conditions, precautions do apply. Overdosage: If you think you have taken too much of this medicine contact a poison control center or  emergency room at once. NOTE: This medicine is only for you. Do not share this medicine with others. What if I miss a dose? If you miss a dose, take it as soon as you can. If it is almost time for your next dose, take only that dose. Do not take double or extra doses. What may interact with this medicine? Do not take this medicine with any of the following medications: -epoetin alfa This list may not describe all possible interactions. Give your health care provider a list of all the medicines, herbs, non-prescription drugs, or dietary supplements you use. Also tell them if you smoke, drink alcohol, or use illegal drugs. Some items may interact with your medicine. What should I watch for while using this medicine? Visit your prescriber or health care professional for regular checks on your progress and for the needed blood tests and blood pressure measurements. It is especially important for the doctor to make sure your hemoglobin level is in the desired range, to limit the risk of potential side effects and to give you the best benefit. Keep all appointments for any recommended tests. Check your blood pressure as directed. Ask your doctor what your blood pressure should be and when you should contact him or her. As your body makes more red blood cells, you may need to take iron, folic acid, or vitamin B supplements. Ask your doctor or health care provider which products are right for you. If you have kidney disease continue dietary restrictions, even though this medication can make you feel better. Talk with your doctor or health   care professional about the foods you eat and the vitamins that you take. What side effects may I notice from receiving this medicine? Side effects that you should report to your doctor or health care professional as soon as possible: -allergic reactions like skin rash, itching or hives, swelling of the face, lips, or tongue -breathing problems -changes in vision -chest  pain -confusion, trouble speaking or understanding -feeling faint or lightheaded, falls -high blood pressure -muscle aches or pains -pain, swelling, warmth in the leg -rapid weight gain -severe headaches -sudden numbness or weakness of the face, arm or leg -trouble walking, dizziness, loss of balance or coordination -seizures (convulsions) -swelling of the ankles, feet, hands -unusually weak or tired Side effects that usually do not require medical attention (report to your doctor or health care professional if they continue or are bothersome): -diarrhea -fever, chills (flu-like symptoms) -headaches -nausea, vomiting -redness, stinging, or swelling at site where injected This list may not describe all possible side effects. Call your doctor for medical advice about side effects. You may report side effects to FDA at 1-800-FDA-1088. Where should I keep my medicine? Keep out of the reach of children. Store in a refrigerator between 2 and 8 degrees C (36 and 46 degrees F). Do not freeze. Do not shake. Throw away any unused portion if using a single-dose vial. Throw away any unused medicine after the expiration date. NOTE: This sheet is a summary. It may not cover all possible information. If you have questions about this medicine, talk to your doctor, pharmacist, or health care provider.  2015, Elsevier/Gold Standard. (2007-12-15 10:23:57)  

## 2014-02-23 ENCOUNTER — Encounter: Payer: Self-pay | Admitting: Internal Medicine

## 2014-03-08 ENCOUNTER — Other Ambulatory Visit: Payer: Self-pay | Admitting: Urology

## 2014-03-08 DIAGNOSIS — C61 Malignant neoplasm of prostate: Secondary | ICD-10-CM

## 2014-03-11 ENCOUNTER — Telehealth: Payer: Self-pay | Admitting: Oncology

## 2014-03-11 NOTE — Telephone Encounter (Signed)
returned call and no vm °

## 2014-03-15 ENCOUNTER — Other Ambulatory Visit (HOSPITAL_BASED_OUTPATIENT_CLINIC_OR_DEPARTMENT_OTHER): Payer: PPO

## 2014-03-15 ENCOUNTER — Ambulatory Visit (HOSPITAL_BASED_OUTPATIENT_CLINIC_OR_DEPARTMENT_OTHER): Payer: PPO

## 2014-03-15 DIAGNOSIS — N189 Chronic kidney disease, unspecified: Secondary | ICD-10-CM

## 2014-03-15 DIAGNOSIS — N178 Other acute kidney failure: Secondary | ICD-10-CM

## 2014-03-15 DIAGNOSIS — D631 Anemia in chronic kidney disease: Secondary | ICD-10-CM

## 2014-03-15 DIAGNOSIS — C61 Malignant neoplasm of prostate: Secondary | ICD-10-CM

## 2014-03-15 LAB — CBC WITH DIFFERENTIAL/PLATELET
BASO%: 0.1 % (ref 0.0–2.0)
Basophils Absolute: 0 10*3/uL (ref 0.0–0.1)
EOS%: 4.5 % (ref 0.0–7.0)
Eosinophils Absolute: 0.3 10*3/uL (ref 0.0–0.5)
HEMATOCRIT: 31.4 % — AB (ref 38.4–49.9)
HGB: 10.2 g/dL — ABNORMAL LOW (ref 13.0–17.1)
LYMPH#: 1.4 10*3/uL (ref 0.9–3.3)
LYMPH%: 21.4 % (ref 14.0–49.0)
MCH: 29.4 pg (ref 27.2–33.4)
MCHC: 32.5 g/dL (ref 32.0–36.0)
MCV: 90.5 fL (ref 79.3–98.0)
MONO#: 0.5 10*3/uL (ref 0.1–0.9)
MONO%: 7 % (ref 0.0–14.0)
NEUT#: 4.5 10*3/uL (ref 1.5–6.5)
NEUT%: 67 % (ref 39.0–75.0)
Platelets: 219 10*3/uL (ref 140–400)
RBC: 3.47 10*6/uL — ABNORMAL LOW (ref 4.20–5.82)
RDW: 17.7 % — ABNORMAL HIGH (ref 11.0–14.6)
WBC: 6.7 10*3/uL (ref 4.0–10.3)

## 2014-03-15 MED ORDER — DARBEPOETIN ALFA 300 MCG/0.6ML IJ SOSY
300.0000 ug | PREFILLED_SYRINGE | Freq: Once | INTRAMUSCULAR | Status: AC
  Administered 2014-03-15: 300 ug via SUBCUTANEOUS
  Filled 2014-03-15: qty 0.6

## 2014-03-15 NOTE — Patient Instructions (Signed)
Darbepoetin Alfa injection What is this medicine? DARBEPOETIN ALFA (dar be POE e tin AL fa) helps your body make more red blood cells. It is used to treat anemia caused by chronic kidney failure and chemotherapy. This medicine may be used for other purposes; ask your health care provider or pharmacist if you have questions. COMMON BRAND NAME(S): Aranesp What should I tell my health care provider before I take this medicine? They need to know if you have any of these conditions: -blood clotting disorders or history of blood clots -cancer patient not on chemotherapy -cystic fibrosis -heart disease, such as angina, heart failure, or a history of a heart attack -hemoglobin level of 12 g/dL or greater -high blood pressure -low levels of folate, iron, or vitamin B12 -seizures -an unusual or allergic reaction to darbepoetin, erythropoietin, albumin, hamster proteins, latex, other medicines, foods, dyes, or preservatives -pregnant or trying to get pregnant -breast-feeding How should I use this medicine? This medicine is for injection into a vein or under the skin. It is usually given by a health care professional in a hospital or clinic setting. If you get this medicine at home, you will be taught how to prepare and give this medicine. Do not shake the solution before you withdraw a dose. Use exactly as directed. Take your medicine at regular intervals. Do not take your medicine more often than directed. It is important that you put your used needles and syringes in a special sharps container. Do not put them in a trash can. If you do not have a sharps container, call your pharmacist or healthcare provider to get one. Talk to your pediatrician regarding the use of this medicine in children. While this medicine may be used in children as young as 1 year for selected conditions, precautions do apply. Overdosage: If you think you have taken too much of this medicine contact a poison control center or  emergency room at once. NOTE: This medicine is only for you. Do not share this medicine with others. What if I miss a dose? If you miss a dose, take it as soon as you can. If it is almost time for your next dose, take only that dose. Do not take double or extra doses. What may interact with this medicine? Do not take this medicine with any of the following medications: -epoetin alfa This list may not describe all possible interactions. Give your health care provider a list of all the medicines, herbs, non-prescription drugs, or dietary supplements you use. Also tell them if you smoke, drink alcohol, or use illegal drugs. Some items may interact with your medicine. What should I watch for while using this medicine? Visit your prescriber or health care professional for regular checks on your progress and for the needed blood tests and blood pressure measurements. It is especially important for the doctor to make sure your hemoglobin level is in the desired range, to limit the risk of potential side effects and to give you the best benefit. Keep all appointments for any recommended tests. Check your blood pressure as directed. Ask your doctor what your blood pressure should be and when you should contact him or her. As your body makes more red blood cells, you may need to take iron, folic acid, or vitamin B supplements. Ask your doctor or health care provider which products are right for you. If you have kidney disease continue dietary restrictions, even though this medication can make you feel better. Talk with your doctor or health   care professional about the foods you eat and the vitamins that you take. What side effects may I notice from receiving this medicine? Side effects that you should report to your doctor or health care professional as soon as possible: -allergic reactions like skin rash, itching or hives, swelling of the face, lips, or tongue -breathing problems -changes in vision -chest  pain -confusion, trouble speaking or understanding -feeling faint or lightheaded, falls -high blood pressure -muscle aches or pains -pain, swelling, warmth in the leg -rapid weight gain -severe headaches -sudden numbness or weakness of the face, arm or leg -trouble walking, dizziness, loss of balance or coordination -seizures (convulsions) -swelling of the ankles, feet, hands -unusually weak or tired Side effects that usually do not require medical attention (report to your doctor or health care professional if they continue or are bothersome): -diarrhea -fever, chills (flu-like symptoms) -headaches -nausea, vomiting -redness, stinging, or swelling at site where injected This list may not describe all possible side effects. Call your doctor for medical advice about side effects. You may report side effects to FDA at 1-800-FDA-1088. Where should I keep my medicine? Keep out of the reach of children. Store in a refrigerator between 2 and 8 degrees C (36 and 46 degrees F). Do not freeze. Do not shake. Throw away any unused portion if using a single-dose vial. Throw away any unused medicine after the expiration date. NOTE: This sheet is a summary. It may not cover all possible information. If you have questions about this medicine, talk to your doctor, pharmacist, or health care provider.  2015, Elsevier/Gold Standard. (2007-12-15 10:23:57)  

## 2014-03-17 ENCOUNTER — Encounter (HOSPITAL_COMMUNITY)
Admission: RE | Admit: 2014-03-17 | Discharge: 2014-03-17 | Disposition: A | Discharge status: Home / Self Care | Payer: PPO | Source: Ambulatory Visit | Attending: Urology | Admitting: Urology

## 2014-03-17 ENCOUNTER — Encounter (HOSPITAL_COMMUNITY): Payer: Self-pay

## 2014-03-17 DIAGNOSIS — C61 Malignant neoplasm of prostate: Secondary | ICD-10-CM | POA: Diagnosis present

## 2014-03-17 MED ORDER — TECHNETIUM TC 99M MEDRONATE IV KIT
26.0000 | PACK | Freq: Once | INTRAVENOUS | Status: AC | PRN
Start: 2014-03-17 — End: 2014-03-17
  Administered 2014-03-17: 26 via INTRAVENOUS

## 2014-04-05 ENCOUNTER — Telehealth: Payer: Self-pay | Admitting: Oncology

## 2014-04-05 ENCOUNTER — Ambulatory Visit (HOSPITAL_BASED_OUTPATIENT_CLINIC_OR_DEPARTMENT_OTHER): Payer: PPO

## 2014-04-05 ENCOUNTER — Ambulatory Visit (HOSPITAL_BASED_OUTPATIENT_CLINIC_OR_DEPARTMENT_OTHER): Payer: PPO | Admitting: Oncology

## 2014-04-05 ENCOUNTER — Other Ambulatory Visit (HOSPITAL_BASED_OUTPATIENT_CLINIC_OR_DEPARTMENT_OTHER): Payer: PPO

## 2014-04-05 VITALS — BP 118/58 | HR 69 | Temp 98.0°F | Resp 18 | Ht 72.0 in | Wt 176.2 lb

## 2014-04-05 DIAGNOSIS — R32 Unspecified urinary incontinence: Secondary | ICD-10-CM | POA: Diagnosis not present

## 2014-04-05 DIAGNOSIS — N189 Chronic kidney disease, unspecified: Secondary | ICD-10-CM

## 2014-04-05 DIAGNOSIS — D631 Anemia in chronic kidney disease: Secondary | ICD-10-CM

## 2014-04-05 DIAGNOSIS — I4891 Unspecified atrial fibrillation: Secondary | ICD-10-CM

## 2014-04-05 DIAGNOSIS — N178 Other acute kidney failure: Secondary | ICD-10-CM

## 2014-04-05 DIAGNOSIS — E611 Iron deficiency: Secondary | ICD-10-CM | POA: Diagnosis not present

## 2014-04-05 DIAGNOSIS — C61 Malignant neoplasm of prostate: Secondary | ICD-10-CM

## 2014-04-05 LAB — CBC WITH DIFFERENTIAL/PLATELET
BASO%: 0.4 % (ref 0.0–2.0)
Basophils Absolute: 0 10*3/uL (ref 0.0–0.1)
EOS%: 4.6 % (ref 0.0–7.0)
Eosinophils Absolute: 0.2 10*3/uL (ref 0.0–0.5)
HEMATOCRIT: 28.4 % — AB (ref 38.4–49.9)
HGB: 8.8 g/dL — ABNORMAL LOW (ref 13.0–17.1)
LYMPH#: 1.6 10*3/uL (ref 0.9–3.3)
LYMPH%: 31.5 % (ref 14.0–49.0)
MCH: 27.2 pg (ref 27.2–33.4)
MCHC: 31 g/dL — AB (ref 32.0–36.0)
MCV: 87.9 fL (ref 79.3–98.0)
MONO#: 0.5 10*3/uL (ref 0.1–0.9)
MONO%: 9.2 % (ref 0.0–14.0)
NEUT#: 2.7 10*3/uL (ref 1.5–6.5)
NEUT%: 54.3 % (ref 39.0–75.0)
PLATELETS: 230 10*3/uL (ref 140–400)
RBC: 3.23 10*6/uL — AB (ref 4.20–5.82)
RDW: 16.9 % — AB (ref 11.0–14.6)
WBC: 5 10*3/uL (ref 4.0–10.3)

## 2014-04-05 MED ORDER — DARBEPOETIN ALFA 300 MCG/0.6ML IJ SOSY
300.0000 ug | PREFILLED_SYRINGE | Freq: Once | INTRAMUSCULAR | Status: AC
  Administered 2014-04-05: 300 ug via SUBCUTANEOUS
  Filled 2014-04-05: qty 0.6

## 2014-04-05 NOTE — Progress Notes (Signed)
Hematology and Oncology Follow Up Visit  [name] [id number] 1931-10-16 81 y.o. 04/05/2014 10:11 AM [name], MDStoneking, Hal, MD   Principle Diagnosis: 81 year old gentleman with multifactorial anemia he has an element of anemia of chronic disease, renal insufficiency and iron deficiency. He could have an element of myelodysplasia. He has also history of prostate cancer.  Prior Therapy: His prostate cancer dates back to 2005 where he had a Gleason score 3+3 = 6. His PSA was 4.4. He was treated with brachytherapy and seed implants and 12/21/2003. His PSA did rise up to 2.89 and found to have local recurrence of his prostate cancer into the bladder neck. He underwent a double J stent placement and his staging workup did not show any widespread metastasis. He was treated with androgen deprivation with Mills Koller and his PSA dropped down to 0.93. He is currently receiving Lupron every 4 months under the care of Dr. Diona Fanti.  Current therapy: Aranesp 300 g every 3 weeks to keep his hemoglobin above 11.  Interim History:  [name] presents today for a follow-up visit. Since the last visit, he continues to do relatively well. He tolerated Aranesp without any complications. He does report some degree of fatigue but have not changed. He was recently diagnosed with a urinary tract infection and finishing a course of antibiotics.  He does not report any active bleeding at this time. He does not report any hematochezia or melena. He is no longer reporting hematuria. He does not report any arthralgias or myalgias. He does report fatigue and tiredness and exertional dyspnea. But still able to perform activities of daily living.  He does not report any headaches, blurry vision, syncope or seizures. He does not report any fevers, chills, sweats does report weight loss and decline in his appetite. He does not report any chest pain, palpitation, orthopnea or PND. He does not report any leg edema,  cough, shortness of breath, difficulty breathing but does report occasional dyspnea on exertion. He does not report any nausea, vomiting, constipation or diarrhea. He does not report any frequency urgency or hesitancy. He does report incontinence. He does not report any mood issues such as anxiety or depression. Rest of his review of systems unremarkable.   Medications: I have reviewed the patient's current medications.  Current Outpatient Prescriptions  Medication Sig Dispense Refill  . calcium citrate-vitamin D (CITRACAL+D) 315-200 MG-UNIT per tablet Take 1 tablet by mouth 2 (two) times daily.    . Glucosamine-Chondroit-Vit C-Mn (GLUCOSAMINE 1500 COMPLEX) CAPS Take 1 capsule by mouth 2 (two) times daily.    . Inulin (FIBER CHOICE PO) Take 1 capsule by mouth daily.    Marland Kitchen levothyroxine (SYNTHROID, LEVOTHROID) 137 MCG tablet Take 137 mcg by mouth daily before breakfast.    . megestrol (MEGACE) 20 MG tablet Take 20 mg by mouth 2 (two) times daily.    . Melatonin 3 MG TABS Take 3 mg by mouth at bedtime. Takes again @ 4 am as needed.    . Methylfol-Methylcob-Acetylcyst (CEREFOLIN NAC) 6-2-600 MG TABS Take one tablet by mouth one time daily 90 each 3  . Multiple Vitamin (MULTIVITAMIN WITH MINERALS) TABS tablet Take 1 tablet by mouth daily.    . nitrofurantoin, macrocrystal-monohydrate, (MACROBID) 100 MG capsule Take 100 mg by mouth 2 (two) times daily.  0  . Omega-3 Krill Oil 300 MG CAPS Take 300 mg by mouth daily.    . propafenone (RYTHMOL) 225 MG tablet Take 1 tablet (225 mg total) by mouth 2 (  two) times daily. 60 tablet 4  . propranolol ER (INDERAL LA) 60 MG 24 hr capsule TAKE 1 CAPSULE (60 MG TOTAL) BY MOUTH DAILY.---   TAKES IN NOON    . rivaroxaban (XARELTO) 20 MG TABS tablet Take 1 tablet (20 mg total) by mouth daily with supper. 30 tablet 2  . solifenacin (VESICARE) 5 MG tablet Take 5 mg by mouth daily.    . tamsulosin (FLOMAX) 0.4 MG CAPS capsule Take 0.4 mg by mouth daily.    Marland Kitchen zolpidem  (AMBIEN) 10 MG tablet Take 10 mg by mouth at bedtime as needed.  1   No current facility-administered medications for this visit.   Facility-Administered Medications Ordered in Other Visits  Medication Dose Route Frequency Provider Last Rate Last Dose  . Darbepoetin Alfa (ARANESP) injection 300 mcg  300 mcg Subcutaneous Once Wyatt Portela, MD         Allergies: No Known Allergies  Past Medical History, Surgical history, Social history, and Family History were reviewed and updated.  Physical Exam: Blood pressure 118/58, pulse 69, temperature 98 F (36.7 C), temperature source Oral, resp. rate 18, height 6' (1.829 m), weight 176 lb 3.2 oz (79.924 kg), SpO2 100 %. ECOG: 1 General appearance: alert and cooperative Head: Normocephalic, without obvious abnormality Neck: no adenopathy Lymph nodes: Cervical, supraclavicular, and axillary nodes normal. Heart:regular rate and rhythm, S1, S2 normal, no murmur, click, rub or gallop Lung:chest clear, no wheezing, rales, normal symmetric air entry Abdomin: soft, non-tender, without masses or organomegaly EXT:no erythema, induration, or nodules   Lab Results: Lab Results  Component Value Date   WBC 5.0 04/05/2014   HGB 8.8* 04/05/2014   HCT 28.4* 04/05/2014   MCV 87.9 04/05/2014   PLT 230 04/05/2014     Chemistry      Component Value Date/Time   NA 137 01/27/2014 1426   NA 141 09/16/2013 0852   K 4.2 01/27/2014 1426   K 4.4 09/16/2013 0852   CL 105 09/16/2013 0852   CO2 21* 01/27/2014 1426   CO2 28 07/30/2013 1147   BUN 35.5* 01/27/2014 1426   BUN 30* 09/16/2013 0852   CREATININE 1.7* 01/27/2014 1426   CREATININE 1.20 09/16/2013 0852      Component Value Date/Time   CALCIUM 9.1 01/27/2014 1426   CALCIUM 9.4 07/30/2013 1147   ALKPHOS 71 01/27/2014 1426   ALKPHOS 61 07/12/2013 0355   AST 15 01/27/2014 1426   AST 23 07/12/2013 0355   ALT 8 01/27/2014 1426   ALT 18 07/12/2013 0355   BILITOT 0.38 01/27/2014 1426   BILITOT  0.3 07/12/2013 0355      Results for TARIS, GALINDO (MRN [id number]) as of 04/05/2014 10:00  Ref. Range 01/27/2014 14:27  Albumin ELP Latest Range: 55.8-66.1 % 58.6  COMMENT (PROTEIN ELECTROPHOR) No range found *  Alpha-1-Globulin Latest Range: 2.9-4.9 % 4.4  Alpha-2-Globulin Latest Range: 7.1-11.8 % 10.5  Beta Globulin Latest Range: 4.7-7.2 % 6.8  Beta 2 Latest Range: 3.2-6.5 % 4.5  Gamma Globulin Latest Range: 11.1-18.8 % 15.2  M-SPIKE, % Latest Units: g/dL NOT DET  SPE Interp. No range found *  IgG (Immunoglobin G), Serum Latest Range: 226-229-6945 mg/dL 1110  IgA Latest Range: 68-379 mg/dL 193  IgM, Serum Latest Range: 41-251 mg/dL 78  Total Protein, Serum Electrophoresis Latest Range: 6.0-8.3 g/dL 7.0     Impression and Plan:   81 year old gentleman with the following issues:  1. Multifactorial anemia. His anemia is normocytic and have been  fluctuating at least for the last 6 months. His erythropoietin is inappropriately elevated at this time. Indicating anemia of renal insufficiency. He is currently on Aranesp 300 g and have tolerated it well. The plan is to continue the current dose and schedule to keep hemoglobin close to 11. The remaining workup was unrevealing. If he has poor response to Aranesp moving forward, will consider bone marrow biopsy to rule out myelodysplasia.   2. Prostate cancer: He did develop locally advanced hormone sensitive prostate cancer and currently on Lupron. His PSA and a reasonable control under the care of Dr. Diona Fanti. At this time no further intervention is recommended.   3. Atrial fibrillation: He is currently in normal sinus rhythm but chronically anticoagulated on Xarelto which could be contributing to microscopic hematuria chronic blood loss. Iron studies will certainly help identify that.  4. Renal insufficiency: His creatinine clearance close to 50 mL/m. Last creatinine was 1.7.    [facility], MD 3/22/201610:11 AM

## 2014-04-05 NOTE — Telephone Encounter (Signed)
Gave avs & calendar for April/May. °

## 2014-04-26 ENCOUNTER — Telehealth: Payer: Self-pay | Admitting: *Deleted

## 2014-04-26 ENCOUNTER — Other Ambulatory Visit: Payer: Self-pay | Admitting: Oncology

## 2014-04-26 ENCOUNTER — Ambulatory Visit: Payer: PPO

## 2014-04-26 ENCOUNTER — Other Ambulatory Visit: Payer: PPO

## 2014-04-26 MED ORDER — DARBEPOETIN ALFA 300 MCG/0.6ML IJ SOSY: 300.0000 ug | PREFILLED_SYRINGE | Freq: Once | INTRAMUSCULAR | Status: DC

## 2014-04-27 ENCOUNTER — Ambulatory Visit (HOSPITAL_BASED_OUTPATIENT_CLINIC_OR_DEPARTMENT_OTHER): Payer: PPO

## 2014-04-27 ENCOUNTER — Other Ambulatory Visit (HOSPITAL_BASED_OUTPATIENT_CLINIC_OR_DEPARTMENT_OTHER): Payer: PPO

## 2014-04-27 VITALS — BP 147/82 | HR 60 | Temp 97.7°F

## 2014-04-27 DIAGNOSIS — N179 Acute kidney failure, unspecified: Secondary | ICD-10-CM

## 2014-04-27 DIAGNOSIS — N189 Chronic kidney disease, unspecified: Secondary | ICD-10-CM

## 2014-04-27 DIAGNOSIS — D500 Iron deficiency anemia secondary to blood loss (chronic): Secondary | ICD-10-CM

## 2014-04-27 DIAGNOSIS — D649 Anemia, unspecified: Secondary | ICD-10-CM

## 2014-04-27 DIAGNOSIS — N289 Disorder of kidney and ureter, unspecified: Secondary | ICD-10-CM

## 2014-04-27 DIAGNOSIS — D631 Anemia in chronic kidney disease: Secondary | ICD-10-CM | POA: Diagnosis not present

## 2014-04-27 LAB — CBC WITH DIFFERENTIAL/PLATELET
BASO%: 0.8 % (ref 0.0–2.0)
BASOS ABS: 0 10*3/uL (ref 0.0–0.1)
EOS%: 1.9 % (ref 0.0–7.0)
Eosinophils Absolute: 0.1 10*3/uL (ref 0.0–0.5)
HCT: 25.2 % — ABNORMAL LOW (ref 38.4–49.9)
HEMOGLOBIN: 7.6 g/dL — AB (ref 13.0–17.1)
LYMPH%: 24.7 % (ref 14.0–49.0)
MCH: 25 pg — ABNORMAL LOW (ref 27.2–33.4)
MCHC: 30.3 g/dL — ABNORMAL LOW (ref 32.0–36.0)
MCV: 82.4 fL (ref 79.3–98.0)
MONO#: 0.4 10*3/uL (ref 0.1–0.9)
MONO%: 6.6 % (ref 0.0–14.0)
NEUT#: 3.7 10*3/uL (ref 1.5–6.5)
NEUT%: 66 % (ref 39.0–75.0)
PLATELETS: 235 10*3/uL (ref 140–400)
RBC: 3.05 10*6/uL — AB (ref 4.20–5.82)
RDW: 18.2 % — ABNORMAL HIGH (ref 11.0–14.6)
WBC: 5.6 10*3/uL (ref 4.0–10.3)
lymph#: 1.4 10*3/uL (ref 0.9–3.3)

## 2014-04-27 MED ORDER — DARBEPOETIN ALFA 300 MCG/0.6ML IJ SOSY
300.0000 ug | PREFILLED_SYRINGE | Freq: Once | INTRAMUSCULAR | Status: AC
  Administered 2014-04-27: 300 ug via SUBCUTANEOUS
  Filled 2014-04-27: qty 0.6

## 2014-04-27 NOTE — Patient Instructions (Signed)
Darbepoetin Alfa injection What is this medicine? DARBEPOETIN ALFA (dar be POE e tin AL fa) helps your body make more red blood cells. It is used to treat anemia caused by chronic kidney failure and chemotherapy. This medicine may be used for other purposes; ask your health care provider or pharmacist if you have questions. COMMON BRAND NAME(S): Aranesp What should I tell my health care provider before I take this medicine? They need to know if you have any of these conditions: -blood clotting disorders or history of blood clots -cancer patient not on chemotherapy -cystic fibrosis -heart disease, such as angina, heart failure, or a history of a heart attack -hemoglobin level of 12 g/dL or greater -high blood pressure -low levels of folate, iron, or vitamin B12 -seizures -an unusual or allergic reaction to darbepoetin, erythropoietin, albumin, hamster proteins, latex, other medicines, foods, dyes, or preservatives -pregnant or trying to get pregnant -breast-feeding How should I use this medicine? This medicine is for injection into a vein or under the skin. It is usually given by a health care professional in a hospital or clinic setting. If you get this medicine at home, you will be taught how to prepare and give this medicine. Do not shake the solution before you withdraw a dose. Use exactly as directed. Take your medicine at regular intervals. Do not take your medicine more often than directed. It is important that you put your used needles and syringes in a special sharps container. Do not put them in a trash can. If you do not have a sharps container, call your pharmacist or healthcare provider to get one. Talk to your pediatrician regarding the use of this medicine in children. While this medicine may be used in children as young as 1 year for selected conditions, precautions do apply. Overdosage: If you think you have taken too much of this medicine contact a poison control center or  emergency room at once. NOTE: This medicine is only for you. Do not share this medicine with others. What if I miss a dose? If you miss a dose, take it as soon as you can. If it is almost time for your next dose, take only that dose. Do not take double or extra doses. What may interact with this medicine? Do not take this medicine with any of the following medications: -epoetin alfa This list may not describe all possible interactions. Give your health care provider a list of all the medicines, herbs, non-prescription drugs, or dietary supplements you use. Also tell them if you smoke, drink alcohol, or use illegal drugs. Some items may interact with your medicine. What should I watch for while using this medicine? Visit your prescriber or health care professional for regular checks on your progress and for the needed blood tests and blood pressure measurements. It is especially important for the doctor to make sure your hemoglobin level is in the desired range, to limit the risk of potential side effects and to give you the best benefit. Keep all appointments for any recommended tests. Check your blood pressure as directed. Ask your doctor what your blood pressure should be and when you should contact him or her. As your body makes more red blood cells, you may need to take iron, folic acid, or vitamin B supplements. Ask your doctor or health care provider which products are right for you. If you have kidney disease continue dietary restrictions, even though this medication can make you feel better. Talk with your doctor or health   care professional about the foods you eat and the vitamins that you take. What side effects may I notice from receiving this medicine? Side effects that you should report to your doctor or health care professional as soon as possible: -allergic reactions like skin rash, itching or hives, swelling of the face, lips, or tongue -breathing problems -changes in vision -chest  pain -confusion, trouble speaking or understanding -feeling faint or lightheaded, falls -high blood pressure -muscle aches or pains -pain, swelling, warmth in the leg -rapid weight gain -severe headaches -sudden numbness or weakness of the face, arm or leg -trouble walking, dizziness, loss of balance or coordination -seizures (convulsions) -swelling of the ankles, feet, hands -unusually weak or tired Side effects that usually do not require medical attention (report to your doctor or health care professional if they continue or are bothersome): -diarrhea -fever, chills (flu-like symptoms) -headaches -nausea, vomiting -redness, stinging, or swelling at site where injected This list may not describe all possible side effects. Call your doctor for medical advice about side effects. You may report side effects to FDA at 1-800-FDA-1088. Where should I keep my medicine? Keep out of the reach of children. Store in a refrigerator between 2 and 8 degrees C (36 and 46 degrees F). Do not freeze. Do not shake. Throw away any unused portion if using a single-dose vial. Throw away any unused medicine after the expiration date. NOTE: This sheet is a summary. It may not cover all possible information. If you have questions about this medicine, talk to your doctor, pharmacist, or health care provider.  2015, Elsevier/Gold Standard. (2007-12-15 10:23:57)  

## 2014-04-27 NOTE — Progress Notes (Signed)
Hgb: 7.6.  Patient c/o fatigue, denies any SOB.  Dr Alen Blew aware of Hgb.  Patient advised to call back or go to ER if he has any SOB or worsening symptoms.

## 2014-04-29 NOTE — Telephone Encounter (Signed)
Called about missed appointment.  Rescheduled to 4/13

## 2014-05-06 ENCOUNTER — Encounter: Payer: Self-pay | Admitting: Internal Medicine

## 2014-05-06 DIAGNOSIS — I4892 Unspecified atrial flutter: Secondary | ICD-10-CM | POA: Diagnosis not present

## 2014-05-09 ENCOUNTER — Telehealth: Payer: Self-pay | Admitting: Internal Medicine

## 2014-05-09 ENCOUNTER — Telehealth: Payer: Self-pay | Admitting: *Deleted

## 2014-05-09 MED ORDER — RIVAROXABAN 15 MG PO TABS: 15.0000 mg | ORAL_TABLET | Freq: Every day | ORAL | Status: DC

## 2014-05-09 NOTE — Telephone Encounter (Signed)
error 

## 2014-05-09 NOTE — Telephone Encounter (Signed)
Instructed patient that he should be on Xarelto 15 mg.  I sent in 10 pills per his request to [facility]. He was asking for more samples.  Advised patient that I would make refill dept aware of sample request. Also discussed financial assistance to help with this medication - and left LIS form and [employer] patient assistance forms at front desk for him to pick up.  Explained that we cannot continue to give samples out for patient's that have been on the medication for extended time but would see if we could help with some while he looks into help with cost. Patient verbalized understanding and agreeable to plan.

## 2014-05-09 NOTE — Telephone Encounter (Signed)
Patient called for a refill of xarelto as he took his last dose last night. He stated that he has been taking the 15mg  qd, but his last ov list has 20mg . I do not see where his dose was changed. Please advise. Thanks, MI

## 2014-05-16 ENCOUNTER — Telehealth: Payer: Self-pay | Admitting: Internal Medicine

## 2014-05-16 NOTE — Telephone Encounter (Signed)
Advised patient I will check into samples while we work on finding affordable medication or assistance. We agreed to speak tomorrow.

## 2014-05-16 NOTE — Telephone Encounter (Signed)
New message     Patient calling working with the cost of Coin.  Patient receive  paperwork .think paperwork is for very low income person.     Patient is looking for another alternative or suggestion.  Patient receive 10 tablet cost was $ 15.00

## 2014-05-17 ENCOUNTER — Ambulatory Visit (HOSPITAL_BASED_OUTPATIENT_CLINIC_OR_DEPARTMENT_OTHER): Payer: PPO

## 2014-05-17 ENCOUNTER — Other Ambulatory Visit (HOSPITAL_BASED_OUTPATIENT_CLINIC_OR_DEPARTMENT_OTHER): Payer: PPO

## 2014-05-17 VITALS — BP 125/63 | HR 59 | Temp 98.2°F | Resp 18

## 2014-05-17 DIAGNOSIS — N289 Disorder of kidney and ureter, unspecified: Secondary | ICD-10-CM | POA: Diagnosis not present

## 2014-05-17 DIAGNOSIS — D631 Anemia in chronic kidney disease: Secondary | ICD-10-CM

## 2014-05-17 DIAGNOSIS — D500 Iron deficiency anemia secondary to blood loss (chronic): Secondary | ICD-10-CM

## 2014-05-17 DIAGNOSIS — D649 Anemia, unspecified: Secondary | ICD-10-CM

## 2014-05-17 DIAGNOSIS — N179 Acute kidney failure, unspecified: Secondary | ICD-10-CM

## 2014-05-17 DIAGNOSIS — N189 Chronic kidney disease, unspecified: Principal | ICD-10-CM

## 2014-05-17 LAB — CBC WITH DIFFERENTIAL/PLATELET
BASO%: 0.6 % (ref 0.0–2.0)
Basophils Absolute: 0 10*3/uL (ref 0.0–0.1)
EOS ABS: 0.1 10*3/uL (ref 0.0–0.5)
EOS%: 2.1 % (ref 0.0–7.0)
HCT: 24.8 % — ABNORMAL LOW (ref 38.4–49.9)
HGB: 7.6 g/dL — ABNORMAL LOW (ref 13.0–17.1)
LYMPH#: 1.3 10*3/uL (ref 0.9–3.3)
LYMPH%: 25.8 % (ref 14.0–49.0)
MCH: 23.8 pg — ABNORMAL LOW (ref 27.2–33.4)
MCHC: 30.5 g/dL — AB (ref 32.0–36.0)
MCV: 78.2 fL — ABNORMAL LOW (ref 79.3–98.0)
MONO#: 0.4 10*3/uL (ref 0.1–0.9)
MONO%: 8.1 % (ref 0.0–14.0)
NEUT%: 63.4 % (ref 39.0–75.0)
NEUTROS ABS: 3.1 10*3/uL (ref 1.5–6.5)
Platelets: 222 10*3/uL (ref 140–400)
RBC: 3.18 10*6/uL — AB (ref 4.20–5.82)
RDW: 17.6 % — AB (ref 11.0–14.6)
WBC: 5 10*3/uL (ref 4.0–10.3)

## 2014-05-17 MED ORDER — DARBEPOETIN ALFA 300 MCG/0.6ML IJ SOSY
300.0000 ug | PREFILLED_SYRINGE | Freq: Once | INTRAMUSCULAR | Status: AC
  Administered 2014-05-17: 300 ug via SUBCUTANEOUS
  Filled 2014-05-17: qty 0.6

## 2014-05-17 NOTE — Patient Instructions (Signed)
Darbepoetin Alfa injection What is this medicine? DARBEPOETIN ALFA (dar be POE e tin AL fa) helps your body make more red blood cells. It is used to treat anemia caused by chronic kidney failure and chemotherapy. This medicine may be used for other purposes; ask your health care provider or pharmacist if you have questions. COMMON BRAND NAME(S): Aranesp What should I tell my health care provider before I take this medicine? They need to know if you have any of these conditions: -blood clotting disorders or history of blood clots -cancer patient not on chemotherapy -cystic fibrosis -heart disease, such as angina, heart failure, or a history of a heart attack -hemoglobin level of 12 g/dL or greater -high blood pressure -low levels of folate, iron, or vitamin B12 -seizures -an unusual or allergic reaction to darbepoetin, erythropoietin, albumin, hamster proteins, latex, other medicines, foods, dyes, or preservatives -pregnant or trying to get pregnant -breast-feeding How should I use this medicine? This medicine is for injection into a vein or under the skin. It is usually given by a health care professional in a hospital or clinic setting. If you get this medicine at home, you will be taught how to prepare and give this medicine. Do not shake the solution before you withdraw a dose. Use exactly as directed. Take your medicine at regular intervals. Do not take your medicine more often than directed. It is important that you put your used needles and syringes in a special sharps container. Do not put them in a trash can. If you do not have a sharps container, call your pharmacist or healthcare provider to get one. Talk to your pediatrician regarding the use of this medicine in children. While this medicine may be used in children as young as 1 year for selected conditions, precautions do apply. Overdosage: If you think you have taken too much of this medicine contact a poison control center or  emergency room at once. NOTE: This medicine is only for you. Do not share this medicine with others. What if I miss a dose? If you miss a dose, take it as soon as you can. If it is almost time for your next dose, take only that dose. Do not take double or extra doses. What may interact with this medicine? Do not take this medicine with any of the following medications: -epoetin alfa This list may not describe all possible interactions. Give your health care provider a list of all the medicines, herbs, non-prescription drugs, or dietary supplements you use. Also tell them if you smoke, drink alcohol, or use illegal drugs. Some items may interact with your medicine. What should I watch for while using this medicine? Visit your prescriber or health care professional for regular checks on your progress and for the needed blood tests and blood pressure measurements. It is especially important for the doctor to make sure your hemoglobin level is in the desired range, to limit the risk of potential side effects and to give you the best benefit. Keep all appointments for any recommended tests. Check your blood pressure as directed. Ask your doctor what your blood pressure should be and when you should contact him or her. As your body makes more red blood cells, you may need to take iron, folic acid, or vitamin B supplements. Ask your doctor or health care provider which products are right for you. If you have kidney disease continue dietary restrictions, even though this medication can make you feel better. Talk with your doctor or health   care professional about the foods you eat and the vitamins that you take. What side effects may I notice from receiving this medicine? Side effects that you should report to your doctor or health care professional as soon as possible: -allergic reactions like skin rash, itching or hives, swelling of the face, lips, or tongue -breathing problems -changes in vision -chest  pain -confusion, trouble speaking or understanding -feeling faint or lightheaded, falls -high blood pressure -muscle aches or pains -pain, swelling, warmth in the leg -rapid weight gain -severe headaches -sudden numbness or weakness of the face, arm or leg -trouble walking, dizziness, loss of balance or coordination -seizures (convulsions) -swelling of the ankles, feet, hands -unusually weak or tired Side effects that usually do not require medical attention (report to your doctor or health care professional if they continue or are bothersome): -diarrhea -fever, chills (flu-like symptoms) -headaches -nausea, vomiting -redness, stinging, or swelling at site where injected This list may not describe all possible side effects. Call your doctor for medical advice about side effects. You may report side effects to FDA at 1-800-FDA-1088. Where should I keep my medicine? Keep out of the reach of children. Store in a refrigerator between 2 and 8 degrees C (36 and 46 degrees F). Do not freeze. Do not shake. Throw away any unused portion if using a single-dose vial. Throw away any unused medicine after the expiration date. NOTE: This sheet is a summary. It may not cover all possible information. If you have questions about this medicine, talk to your doctor, pharmacist, or health care provider.  2015, Elsevier/Gold Standard. (2007-12-15 10:23:57)  

## 2014-05-17 NOTE — Progress Notes (Signed)
Pt in for Aranesp injection today, Hgb is 7.6. Patient c/o fatigue, with some dozing off during the day, denies any SOB. Dr Alen Blew aware of Hgb and symptoms. Aranespinjection given, no transfusion today per Dr. Alen Blew. Patient advised to call with any worsening symptoms.

## 2014-05-17 NOTE — Telephone Encounter (Signed)
Left samples of Xarelto and medication assist paperwork through [employer] at front desk for patient pick up. Patient thanked me tremendously for all the help with this.

## 2014-05-20 ENCOUNTER — Telehealth: Payer: Self-pay | Admitting: Internal Medicine

## 2014-05-20 NOTE — Telephone Encounter (Signed)
error 

## 2014-05-27 ENCOUNTER — Other Ambulatory Visit: Payer: Self-pay | Admitting: Internal Medicine

## 2014-06-04 ENCOUNTER — Other Ambulatory Visit: Payer: Self-pay | Admitting: Internal Medicine

## 2014-06-07 ENCOUNTER — Other Ambulatory Visit (HOSPITAL_BASED_OUTPATIENT_CLINIC_OR_DEPARTMENT_OTHER): Payer: PPO

## 2014-06-07 ENCOUNTER — Ambulatory Visit (HOSPITAL_BASED_OUTPATIENT_CLINIC_OR_DEPARTMENT_OTHER): Payer: PPO

## 2014-06-07 ENCOUNTER — Ambulatory Visit (HOSPITAL_BASED_OUTPATIENT_CLINIC_OR_DEPARTMENT_OTHER): Payer: PPO | Admitting: Oncology

## 2014-06-07 ENCOUNTER — Telehealth: Payer: Self-pay | Admitting: *Deleted

## 2014-06-07 ENCOUNTER — Ambulatory Visit (HOSPITAL_COMMUNITY)
Admission: RE | Admit: 2014-06-07 | Discharge: 2014-06-07 | Disposition: A | Discharge status: Home / Self Care | Payer: PPO | Source: Ambulatory Visit | Attending: Oncology | Admitting: Oncology

## 2014-06-07 ENCOUNTER — Ambulatory Visit: Payer: PPO

## 2014-06-07 VITALS — BP 121/60 | HR 70 | Temp 97.7°F | Resp 18 | Ht 72.0 in | Wt 184.1 lb

## 2014-06-07 VITALS — BP 149/68 | HR 60 | Temp 97.3°F | Resp 18

## 2014-06-07 DIAGNOSIS — N189 Chronic kidney disease, unspecified: Secondary | ICD-10-CM | POA: Diagnosis not present

## 2014-06-07 DIAGNOSIS — D631 Anemia in chronic kidney disease: Secondary | ICD-10-CM

## 2014-06-07 DIAGNOSIS — I4891 Unspecified atrial fibrillation: Secondary | ICD-10-CM

## 2014-06-07 DIAGNOSIS — Z7901 Long term (current) use of anticoagulants: Secondary | ICD-10-CM | POA: Diagnosis not present

## 2014-06-07 DIAGNOSIS — Z8546 Personal history of malignant neoplasm of prostate: Secondary | ICD-10-CM

## 2014-06-07 LAB — CBC WITH DIFFERENTIAL/PLATELET
BASO%: 0.4 % (ref 0.0–2.0)
Basophils Absolute: 0 10*3/uL (ref 0.0–0.1)
EOS%: 2.1 % (ref 0.0–7.0)
Eosinophils Absolute: 0.1 10*3/uL (ref 0.0–0.5)
HCT: 20.3 % — ABNORMAL LOW (ref 38.4–49.9)
HEMOGLOBIN: 6 g/dL — AB (ref 13.0–17.1)
LYMPH%: 26.4 % (ref 14.0–49.0)
MCH: 23 pg — ABNORMAL LOW (ref 27.2–33.4)
MCHC: 29.6 g/dL — ABNORMAL LOW (ref 32.0–36.0)
MCV: 77.8 fL — AB (ref 79.3–98.0)
MONO#: 0.5 10*3/uL (ref 0.1–0.9)
MONO%: 9.5 % (ref 0.0–14.0)
NEUT%: 61.6 % (ref 39.0–75.0)
NEUTROS ABS: 2.9 10*3/uL (ref 1.5–6.5)
PLATELETS: 209 10*3/uL (ref 140–400)
RBC: 2.61 10*6/uL — ABNORMAL LOW (ref 4.20–5.82)
RDW: 16.5 % — ABNORMAL HIGH (ref 11.0–14.6)
WBC: 4.7 10*3/uL (ref 4.0–10.3)
lymph#: 1.3 10*3/uL (ref 0.9–3.3)

## 2014-06-07 LAB — IRON AND TIBC CHCC
%SAT: 3 % — ABNORMAL LOW (ref 20–55)
IRON: 11 ug/dL — AB (ref 42–163)
TIBC: 356 ug/dL (ref 202–409)
UIBC: 345 ug/dL (ref 117–376)

## 2014-06-07 LAB — HOLD TUBE, BLOOD BANK

## 2014-06-07 LAB — PREPARE RBC (CROSSMATCH)

## 2014-06-07 LAB — FERRITIN CHCC: Ferritin: 10 ng/ml — ABNORMAL LOW (ref 22–316)

## 2014-06-07 MED ORDER — SODIUM CHLORIDE 0.9 % IV SOLN
250.0000 mL | Freq: Once | INTRAVENOUS | Status: AC
  Administered 2014-06-07: 250 mL via INTRAVENOUS

## 2014-06-07 NOTE — Telephone Encounter (Signed)
Per staff message and POF I have scheduled appts. Advised scheduler of appts. JMW  

## 2014-06-07 NOTE — Patient Instructions (Signed)

## 2014-06-07 NOTE — Progress Notes (Signed)
Hematology and Oncology Follow Up Visit  [name] [id number] 05/11/1931 81 y.o. 06/07/2014 11:07 AM [name], MDStoneking, Hal, MD   Principle Diagnosis: 81 year old gentleman with multifactorial anemia he has an element of anemia of chronic disease, renal insufficiency and iron deficiency. He could have an element of myelodysplasia. He has also history of prostate cancer.  Prior Therapy: His prostate cancer dates back to 2005 where he had a Gleason score 3+3 = 6. His PSA was 4.4. He was treated with brachytherapy and seed implants and 12/21/2003. His PSA did rise up to 2.89 and found to have local recurrence of his prostate cancer into the bladder neck. He underwent a double J stent placement and his staging workup did not show any widespread metastasis. He was treated with androgen deprivation with Mills Koller and his PSA dropped down to 0.93. He is currently receiving Lupron every 4 months under the care of Dr. Diona Fanti.  Current therapy: Aranesp 300 g every 3 weeks to keep his hemoglobin above 11.  Interim History:  [name] presents today for a follow-up visit. Since the last visit, he reports slight fatigue but otherwise feels about the same. He continues to do relatively well.  Has not reported any frequency or urgency. He does not report any chest pain or palpitation. He does report some occasional dizziness if he stands up quickly. He tolerated Aranesp without any complications.  He does not report any active bleeding at this time. He does not report any hematochezia or melena. He is no longer reporting hematuria. He does not report any arthralgias or myalgias. He does report fatigue and tiredness and exertional dyspnea. But still able to perform activities of daily living.  He does not report any fevers, chills, sweats does report weight loss and decline in his appetite. He does not report any chest pain, palpitation, orthopnea or PND. He does not report any leg edema,  cough, shortness of breath, difficulty breathing but does report occasional dyspnea on exertion. He does not report any nausea, vomiting, constipation or diarrhea. He does not report any frequency urgency or hesitancy. He does report incontinence. He does not report any mood issues such as anxiety or depression. Rest of his review of systems unremarkable.   Medications: I have reviewed the patient's current medications.  Current Outpatient Prescriptions  Medication Sig Dispense Refill  . calcium citrate-vitamin D (CITRACAL+D) 315-200 MG-UNIT per tablet Take 1 tablet by mouth 2 (two) times daily.    . Glucosamine-Chondroit-Vit C-Mn (GLUCOSAMINE 1500 COMPLEX) CAPS Take 1 capsule by mouth 2 (two) times daily.    . Inulin (FIBER CHOICE PO) Take 1 capsule by mouth daily.    Marland Kitchen levothyroxine (SYNTHROID, LEVOTHROID) 137 MCG tablet Take 137 mcg by mouth daily before breakfast.    . megestrol (MEGACE) 20 MG tablet Take 20 mg by mouth 2 (two) times daily.    . Melatonin 3 MG TABS Take 3 mg by mouth at bedtime. Takes again @ 4 am as needed.    . Methylfol-Methylcob-Acetylcyst (CEREFOLIN NAC) 6-2-600 MG TABS Take one tablet by mouth one time daily 90 each 3  . Multiple Vitamin (MULTIVITAMIN WITH MINERALS) TABS tablet Take 1 tablet by mouth daily.    . nitrofurantoin, macrocrystal-monohydrate, (MACROBID) 100 MG capsule Take 100 mg by mouth 2 (two) times daily.  0  . Omega-3 Krill Oil 300 MG CAPS Take 300 mg by mouth daily.    . propafenone (RYTHMOL) 225 MG tablet TAKE 1 TABLET (225 MG TOTAL) BY MOUTH  2 (TWO) TIMES DAILY. 60 tablet 6  . propranolol (INDERAL) 10 MG tablet Take 10 mg by mouth 3 (three) times daily.    . rivaroxaban (XARELTO) 15 MG TABS tablet Take 1 tablet (15 mg total) by mouth daily with supper. 10 tablet 0  . solifenacin (VESICARE) 5 MG tablet Take 5 mg by mouth daily.    . tamsulosin (FLOMAX) 0.4 MG CAPS capsule Take 0.4 mg by mouth daily.    Marland Kitchen zolpidem (AMBIEN) 10 MG tablet Take 10 mg by  mouth at bedtime as needed.  1   No current facility-administered medications for this visit.     Allergies: No Known Allergies  Past Medical History, Surgical history, Social history, and Family History were reviewed and updated.  Physical Exam: Blood pressure 121/60, pulse 70, temperature 97.7 F (36.5 C), temperature source Oral, resp. rate 18, height 6' (1.829 m), weight 184 lb 1.6 oz (83.507 kg), SpO2 100 %. ECOG: 1 General appearance: alert and cooperative. Pale appearing. Head: Normocephalic, without obvious abnormality Neck: no adenopathy Lymph nodes: Cervical, supraclavicular, and axillary nodes normal. Heart:regular rate and rhythm, S1, S2 normal, no murmur, click, rub or gallop Lung:chest clear, no wheezing, rales, normal symmetric air entry Abdomin: soft, non-tender, without masses or organomegaly EXT:no erythema, induration, or nodules   Lab Results: Lab Results  Component Value Date   WBC 4.7 06/07/2014   HGB 6.0* 06/07/2014   HCT 20.3* 06/07/2014   MCV 77.8* 06/07/2014   PLT 209 06/07/2014     Chemistry      Component Value Date/Time   NA 137 01/27/2014 1426   NA 141 09/16/2013 0852   K 4.2 01/27/2014 1426   K 4.4 09/16/2013 0852   CL 105 09/16/2013 0852   CO2 21* 01/27/2014 1426   CO2 28 07/30/2013 1147   BUN 35.5* 01/27/2014 1426   BUN 30* 09/16/2013 0852   CREATININE 1.7* 01/27/2014 1426   CREATININE 1.20 09/16/2013 0852      Component Value Date/Time   CALCIUM 9.1 01/27/2014 1426   CALCIUM 9.4 07/30/2013 1147   ALKPHOS 71 01/27/2014 1426   ALKPHOS 61 07/12/2013 0355   AST 15 01/27/2014 1426   AST 23 07/12/2013 0355   ALT 8 01/27/2014 1426   ALT 18 07/12/2013 0355   BILITOT 0.38 01/27/2014 1426   BILITOT 0.3 07/12/2013 0355       Impression and Plan:   81 year old gentleman with the following issues:  1. Multifactorial anemia. His anemia is normocytic and have been fluctuating at least for the last 6 months. His erythropoietin is  inappropriately elevated at this time. Indicating anemia of renal insufficiency. He is currently on Aranesp 300 g and have tolerated it well.   His hemoglobin continued decline despite iron supplementation and growth factor support. Differential diagnosis was discussed today including red cell aplasia versus myelodysplastic syndrome versus anemia of chronic disease. To fully investigate this he will need a bone marrow biopsy. Risks and benefits of this procedure was discussed. Complications include bleeding, infection, pain among others were discussed. He is agreeable to proceed.  In the meantime, we will continue growth factor support but we will change the schedule to every 2 weeks to keep his hemoglobin close to 10. I will also recheck his iron stores to make sure there are fully repleted.  He is slightly symptomatic today and I will set up to units of packed cell transfusion despite week to have his symptoms.   2. Prostate cancer: He did develop  locally advanced hormone sensitive prostate cancer and currently on Lupron. His PSA and a reasonable control under the care of Dr. Diona Fanti. At this time no further intervention is recommended.   3. Atrial fibrillation: He is currently in normal sinus rhythm but chronically anticoagulated on Xarelto which could be contributing to microscopic hematuria chronic blood loss. Iron studies will certainly help identify that.  4. Renal insufficiency: His creatinine clearance close to 50 mL/m. Last creatinine was 1.7.    [facility], MD 5/24/201611:07 AM

## 2014-06-08 ENCOUNTER — Telehealth: Payer: Self-pay | Admitting: *Deleted

## 2014-06-08 ENCOUNTER — Ambulatory Visit: Payer: PPO | Attending: Geriatric Medicine | Admitting: Physical Therapy

## 2014-06-08 ENCOUNTER — Other Ambulatory Visit: Payer: Self-pay | Admitting: *Deleted

## 2014-06-08 ENCOUNTER — Encounter: Payer: Self-pay | Admitting: *Deleted

## 2014-06-08 ENCOUNTER — Ambulatory Visit (HOSPITAL_BASED_OUTPATIENT_CLINIC_OR_DEPARTMENT_OTHER): Payer: PPO

## 2014-06-08 ENCOUNTER — Telehealth: Payer: Self-pay | Admitting: Oncology

## 2014-06-08 ENCOUNTER — Encounter: Payer: Self-pay | Admitting: Physical Therapy

## 2014-06-08 DIAGNOSIS — N189 Chronic kidney disease, unspecified: Secondary | ICD-10-CM | POA: Diagnosis not present

## 2014-06-08 DIAGNOSIS — R269 Unspecified abnormalities of gait and mobility: Secondary | ICD-10-CM | POA: Diagnosis not present

## 2014-06-08 DIAGNOSIS — R279 Unspecified lack of coordination: Secondary | ICD-10-CM | POA: Insufficient documentation

## 2014-06-08 DIAGNOSIS — R6889 Other general symptoms and signs: Secondary | ICD-10-CM | POA: Insufficient documentation

## 2014-06-08 DIAGNOSIS — R531 Weakness: Secondary | ICD-10-CM

## 2014-06-08 DIAGNOSIS — C61 Malignant neoplasm of prostate: Secondary | ICD-10-CM

## 2014-06-08 DIAGNOSIS — D631 Anemia in chronic kidney disease: Secondary | ICD-10-CM | POA: Diagnosis not present

## 2014-06-08 DIAGNOSIS — R293 Abnormal posture: Secondary | ICD-10-CM | POA: Diagnosis not present

## 2014-06-08 LAB — PREPARE RBC (CROSSMATCH)

## 2014-06-08 MED ORDER — DIPHENHYDRAMINE HCL 25 MG PO CAPS
25.0000 mg | ORAL_CAPSULE | Freq: Once | ORAL | Status: AC
  Administered 2014-06-08: 25 mg via ORAL

## 2014-06-08 MED ORDER — LORAZEPAM 1 MG PO TABS
ORAL_TABLET | ORAL | Status: AC
  Filled 2014-06-08: qty 1

## 2014-06-08 MED ORDER — LORAZEPAM 1 MG PO TABS
0.5000 mg | ORAL_TABLET | Freq: Once | ORAL | Status: AC
  Administered 2014-06-08: 0.5 mg via ORAL

## 2014-06-08 MED ORDER — SODIUM CHLORIDE 0.9 % IV SOLN
250.0000 mL | Freq: Once | INTRAVENOUS | Status: AC
  Administered 2014-06-08: 250 mL via INTRAVENOUS

## 2014-06-08 MED ORDER — DIPHENHYDRAMINE HCL 25 MG PO CAPS
ORAL_CAPSULE | ORAL | Status: AC
  Filled 2014-06-08: qty 1

## 2014-06-08 NOTE — Patient Instructions (Signed)

## 2014-06-08 NOTE — Progress Notes (Signed)
Patient took 2 Aleve prior to his arrival today. Patient states that he signed a blood consent on 05/28/14.  1610: Patient requesting something for restless legs. Dr. Alen Blew called. Orders for Ativan 0.5 received and medication administered. 1700: "Some relief" per patient.

## 2014-06-08 NOTE — Telephone Encounter (Signed)
Spoke with patient today. Reminded him of his appt for blood transfusion.

## 2014-06-08 NOTE — Telephone Encounter (Signed)
Spoke with patient, he is to hold xaralto, starting the day before his biopsy, per IR instructions, patient verbalized understanding.

## 2014-06-08 NOTE — Telephone Encounter (Signed)
lvm for pt regarding to added blood today at 2pm

## 2014-06-08 NOTE — Telephone Encounter (Signed)
-----   Message from Wyatt Portela, MD sent at 06/08/2014  9:16 AM EDT ----- Regarding: FW: Ct Biopsy Can you please ask him to hold Xarelto per IR request.   Thanks.  ----- Message -----    From: Alecia Lemming    Sent: 06/08/2014   8:40 AM      To: Wyatt Portela, MD Subject: Ct Biopsy                                      [name] is schedule for Bone Marrow on June 2@[facility] ,pt takes Xarelto he needs to hold blood thinners 1 day prior.If someone can call pt have him hold medicine please.Thanks Elmo Putt

## 2014-06-09 DIAGNOSIS — R269 Unspecified abnormalities of gait and mobility: Secondary | ICD-10-CM | POA: Diagnosis not present

## 2014-06-09 NOTE — Therapy (Signed)
[facility] [address] [place], [place], [address] Phone: [phone]   Fax:  [phone]  Physical Therapy Evaluation  Patient Details  Name: [name] MRN: [id number] Date of Birth: Jul 17, 1931 Referring Provider:  Lajean Manes, MD  Encounter Date: 06/08/2014      PT End of Session - 06/08/14 1336    Visit Number 1   Number of Visits 9   Date for PT Re-Evaluation 08/07/14   Authorization Type Medicare: G-Code required every 10th visit.   PT Start Time 1147   PT Stop Time 1239   PT Time Calculation (min) 52 min   Activity Tolerance Patient tolerated treatment well   Behavior During Therapy WFL for tasks assessed/performed      Past Medical History  Diagnosis Date  . Orthostatic lightheadedness   . History of subdural hematoma     LEFT CHRONIC SUBDURAL HEMATOMA   S/P PARIETAL CRANIOTOMY WITH EVACUATION HEMATOMA  . Atrial fibrillation, persistent   . H/O cardiac radiofrequency ablation     A-FLUTTER  . Cardiac pacemaker last check 08-30-2013    05-05-2007  DD  MEDTRONIC GUIDANT INSIGNIA  . RBBB   . Benign essential tremor   . Diverticulosis of colon   . Tachy-brady syndrome   . SSS (sick sinus syndrome)   . Recurrent prostate carcinoma     2005  S/P RADIOACTIVE SEED IMPLANTS/   BLADDER NECK MASS RESECTED 05/ 2015  SHOWED RECURRENT PROSTATE CANCER  . Hydronephrosis, bilateral   . Renal insufficiency     Past Surgical History  Procedure Laterality Date  . Tonsillectomy  as child  . Subdural hematoma evacuation via craniotomy  12-16-2007  . Rotator cuff repair w/ distal clavicle excision Left 08-31-2010  . Pacemaker insertion  05-05-2007      Guidant Insignia [employer])  . Total knee arthroplasty  12/18/2010    Procedure: TOTAL KNEE ARTHROPLASTY;  Surgeon: Mauri Pole;  Location: [facility];  Service: Orthopedics;  Laterality: Left;  . Cataract extraction    . Cystoscopy w/ ureteral stent  placement Bilateral 05/30/2013    Procedure: CYSTOSCOPY WITH RETROGRADE PYELOGRAM/ BILATERAL URETERAL STENT PLACEMENT, TRANSURETRAL RESECTION OF BLADDER TUMOR WITH GYRUS;  Surgeon: Alexis Frock, MD;  Location: [facility];  Service: Urology;  Laterality: Bilateral;  . Cardioversion N/A 08/04/2013    Procedure: CARDIOVERSION;  Surgeon: Sanda Klein, MD;  Location: [facility];  Service: Cardiovascular;  Laterality: N/A;  . Knee arthroscopy Left 02-11-2007  . Cardioversion  multiple--  last one 08-04-2013    SINCE 2008  . Cardiac electrophysiology mapping and ablation  04-06-2007  dr Caryl Comes  . Cardiac catheterization  12-22-2006      non-obstructive CAD / RCA 20%/  ef 50%/  a-fib  . Radioactive prostate seed implants  12-21-2003  . Orif left distal clavical fx  08-31-2010  . Colonoscopy w/ polypectomy  12-01-2008  . Transthoracic echocardiogram  05-31-2013    MILD LVH/  EF 65%/  MODERATE LAE/  TRIVIAL MR  &  TR  . Cystoscopy/retrograde/ureteroscopy Right 09/16/2013    Procedure: CYSTOSCOPY/RETROGRADE/URETEROSCOPIC STENT EXTRACTION/  bil retrograde pyelogram with inseertion of bilateral stents;  Surgeon: Jorja Loa, MD;  Location: [facility];  Service: Urology;  Laterality: Right;    There were no vitals filed for this visit.  Visit Diagnosis:  Abnormality of gait - Plan: PT plan of care cert/re-cert  Weakness generalized - Plan: PT plan of care cert/re-cert  Lack of coordination - Plan:  PT plan of care cert/re-cert  Abnormal posture - Plan: PT plan of care cert/re-cert  Activity intolerance - Plan: PT plan of care cert/re-cert      Subjective Assessment - 06/09/14 1314    Subjective Pt reports fear of falling as well as a period of frequent falls approximately 2 months ago. Pt also reports inability to walk as far due to leg fatigue, which pt attributes to anemia. Pt  also feels as though posture has worsened in recent months.   Patient is accompained by: Family  member   Pertinent History Cancer, pacemaker, Afib   Patient Stated Goals "Balance is the critical concern we have. I'm also taking little short steps and would like to improve the strength in my legs and overall physical health."   Currently in Pain? No/denies         [facility] PT Assessment - 06/09/14 0001    Assessment   Medical Diagnosis Gait disorder; falls   Onset Date/Surgical Date --  03/2014   Prior Therapy --  Bout of OPPT ~6 months ago   Precautions   Precautions None   Restrictions   Weight Bearing Restrictions No   Balance Screen   Has the patient fallen in the past 6 months Yes   How many times? 6   Has the patient had a decrease in activity level because of a fear of falling?  Yes   Is the patient reluctant to leave their home because of a fear of falling?  No   Home Ecologist residence   Living Arrangements Spouse/significant other   Available Help at Discharge Family   Type of [place] to enter   Entrance Stairs-Number of Steps 2   [place] One level   [place] - 4 wheels;Other (comment)   Additional Comments foldable walker to negotiate doorways   Prior Function   Level of Independence Independent with basic ADLs;Independent with household mobility with device;Requires assistive device for independence;Independent with community mobility with device   Vocation Retired   Leisure watching movies and playing bridge, civic club, [employer]   Overall Cognitive Status Impaired/Different from baseline   Area of Impairment Memory   Memory Decreased short-term memory   Observation/Other Assessments   Focus on Therapeutic Outcomes (FOTO)  42   Observation/Other Assessments-Edema    Edema --  Noted bilat LE edema; pt reports this is baseline   Sensation   Light Touch Appears Intact   Coordination   Gross Motor Movements are Fluid and Coordinated Yes    Heel Shin Test WNL and equal bilat   Posture/Postural Control   Posture/Postural Control Postural limitations   Postural Limitations Forward head;Rounded Shoulders;Increased thoracic kyphosis;Posterior pelvic tilt   AROM   Overall AROM  Deficits   Overall AROM Comments limited L knee extension   Left Knee Extension -3   PROM   Overall PROM  Deficits   Overall PROM Comments limited L knee extension   Left Knee Extension -3   Strength   Overall Strength Deficits   Overall Strength Comments WFL bilat with exception of L ankle plantar/dorsiflexion, 4/5 L hip ABD, 4+/5 R hip ABD   Transfers   Transfers Sit to Stand;Stand to Sit   Sit to Stand 6: Modified independent (Device/Increase time)   Stand to Sit 6: Modified independent (Device/Increase time)   Ambulation/Gait   Ambulation/Gait Yes   Ambulation/Gait Assistance  5: Supervision   Ambulation Distance (Feet) 200 Feet   Assistive device Rolling walker   Gait Pattern Step-through pattern;Decreased step length - right;Decreased step length - left;Decreased dorsiflexion - left;Decreased dorsiflexion - right;Decreased trunk rotation;Trunk flexed   Ambulation Surface Level;Indoor   Gait velocity 2.04 ft/second   Balance   Balance Assessed Yes   Static Standing Balance   Static Standing - Balance Support No upper extremity supported   Static Standing - Level of Assistance 5: Stand by assistance   Static Standing Balance -  Activities  Single Leg Stance - Right Leg;Tandam Stance - Right Leg;Single Leg Stance - Left Leg;Romberg - Eyes Opened;Romberg - Eyes Closed   Static Standing - Comment/# of Minutes Standing with narrow BOS, R semi tandem >15 seconds with CGA, no overt LOB. Unable to achieve R tandem stance without min A due to LOB. Single limb stance < 5 seconds (bilaterally) prior to LOB.   Standardized Balance Assessment   Standardized Balance Assessment Timed Up and Go Test   Timed Up and Go Test   TUG Normal TUG   Normal TUG  (seconds) 18.75   TUG Comments using rolling walker           PT Education - 06/08/14 1334    Education provided Yes   Education Details Explained evaluation findings, goals, and POC.   Person(s) Educated Patient;Spouse   Methods Explanation   Comprehension Verbalized understanding          PT Short Term Goals - 06/09/14 0748    PT SHORT TERM GOAL #1   Title Pt will demonstrate understanding of HEP with mod I using paper handout to facilitate safe carryover of home exercises. Target date: 07/06/14.   Status New   PT SHORT TERM GOAL #2   Title Pt will verbalize understanding of fall prevention strategies to decrease risk of falling within home. Target date: 07/06/14.   Status New   PT SHORT TERM GOAL #3   Title Pt will increase Berg Balance Scale score by 3 points from initial assessment to demonstrate improved functional standing balance. Target date: 07/06/14.           PT Long Term Goals - 06/09/14 0749    PT LONG TERM GOAL #1   Title Pt will perform TUG in 13.5 seconds or less to demonstrate decreased risk of falling. Target date: 08/07/14.   PT LONG TERM GOAL #2   Title Pt will exhibit gait speed of 2.86 ft/sec to demonstrate significantly improved stability with functional mobility. Target date: 08/07/14.   PT LONG TERM GOAL #3   Title Pt willl demonstrate FOTO measure of 52 to demonstrate significant change in patient-perceived functional status. Target date: 08/07/14.   PT LONG TERM GOAL #4   Title Pt will ambulate 500' over level and unlevel surfaces with LRAD and mod I to demonstrate increased safety with community ambulation. Target date: 08/07/14.   PT LONG TERM GOAL #5   Title Pt will negotiate standard curb step with LRAD and mod I to meet pt-stated goal and exhibit safety with community mobility. Target date: 08/07/14.   Additional Long Term Goals   Additional Long Term Goals Yes   PT LONG TERM GOAL #6   Title Pt will increase Berg Balance Scale score by 6 points  from initial assessment to demonstrate improved functional standing balance. Target date: 08/07/14.               Plan - 06/08/14 1637  Clinical Impression Statement Pt presents with recent history of falls, decreased postural/gait stability, and pt-reported decrease in activity tolerance. Pt is concerned with progressively more impaired postural alginment (thoracic kyphosis) and worsening gait impairments (decreased bilat step length). Pt will benefit from skilled PT services to increase stability with functional mobility and decrease risk of falls.   Pt will benefit from skilled therapeutic intervention in order to improve on the following deficits Abnormal gait;Decreased balance;Postural dysfunction;Decreased strength;Impaired perceived functional ability;Decreased activity tolerance;Decreased coordination;Decreased knowledge of use of DME;Decreased mobility;Decreased range of motion;Impaired flexibility;Increased edema;Cardiopulmonary status limiting activity;Decreased endurance   Rehab Potential Good   Clinical Impairments Affecting Rehab Potential Pt reports impaired short term memory.   PT Frequency 1x / week  Recommend 2x/week for 8 weeks based on clinical judgement; however, pt politely declined due to financial limitations   PT Duration 8 weeks   PT Treatment/Interventions Gait training;DME Instruction;Stair training;Functional mobility training;Therapeutic activities;Therapeutic exercise;Balance training;Neuromuscular re-education;Patient/family education;ADLs/Self Care Home Management;Moist Heat;Orthotic Fit/Training;Manual techniques;Energy conservation;Vestibular;Passive range of motion   PT Next Visit Plan Complete Berg and initiate HEP with focus on functional standing balance, single limb stance stability, and postural alignment.   Consulted and Agree with Plan of Care Patient;Family member/caregiver   Family Member Consulted Wife, [name] - 2014/07/07  [id number]    Functional Assessment Tool Used FOTO primary score; TUG with RW: 18.75 seconds; gait speed: 2.04 ft/sec   Functional Limitation Mobility: Walking and moving around   Mobility: Walking and Moving Around Current Status [phone]) At least 40 percent but less than 60 percent impaired, limited or restricted   Mobility: Walking and Moving Around Goal Status [phone]) At least 20 percent but less than 40 percent impaired, limited or restricted       Problem List Patient Active Problem List   Diagnosis Date Noted  . UTI (lower urinary tract infection) 07/10/2013  . Sepsis 07/10/2013  . Prostate cancer 06/02/2013  . Unspecified constipation 06/02/2013  . Bladder mass 06/01/2013  . ARF (acute renal failure) 05/29/2013  . Hyperkalemia 05/29/2013  . Acute renal failure 05/28/2013  . Anemia 05/28/2013  . Atrial tachycardia 10/23/2011  . S/P left total knee replacement 12/19/2010  . Pacemaker-dual-chamber-[employer] 06/07/2010  . Orthostatic lightheadedness 06/07/2010  . Mitral regurgitation 06/07/2010  . AV BLOCK, COMPLETE 04/25/2009  . ATRIAL FIBRILLATION-paroxysmal 05/12/2008  . SICK SINUS/ TACHY-BRADY SYNDROME 05/12/2008    Billie Ruddy, PT, DPT [facility] [address] [place], [place], [address] Phone: [phone]   Fax:  [phone] 07/07/2014, 1:34 PM  [facility] [address] [place], [place], [address] Phone: [phone]   Fax:  [phone]

## 2014-06-11 LAB — TYPE AND SCREEN
ABO/RH(D): O POS
ANTIBODY SCREEN: NEGATIVE
UNIT DIVISION: 0
Unit division: 0
Unit division: 0

## 2014-06-15 ENCOUNTER — Other Ambulatory Visit: Payer: Self-pay | Admitting: Physician Assistant

## 2014-06-15 ENCOUNTER — Ambulatory Visit (HOSPITAL_COMMUNITY)
Admission: RE | Admit: 2014-06-15 | Discharge: 2014-06-15 | Disposition: A | Discharge status: Home / Self Care | Payer: PPO | Source: Ambulatory Visit | Attending: Oncology | Admitting: Oncology

## 2014-06-15 ENCOUNTER — Telehealth: Payer: Self-pay | Admitting: Physical Therapy

## 2014-06-15 ENCOUNTER — Ambulatory Visit: Payer: PPO | Attending: Geriatric Medicine | Admitting: Physical Therapy

## 2014-06-15 VITALS — BP 110/78 | HR 72

## 2014-06-15 DIAGNOSIS — R279 Unspecified lack of coordination: Secondary | ICD-10-CM | POA: Insufficient documentation

## 2014-06-15 DIAGNOSIS — Z7409 Other reduced mobility: Secondary | ICD-10-CM | POA: Diagnosis present

## 2014-06-15 DIAGNOSIS — R531 Weakness: Secondary | ICD-10-CM | POA: Insufficient documentation

## 2014-06-15 DIAGNOSIS — R293 Abnormal posture: Secondary | ICD-10-CM | POA: Insufficient documentation

## 2014-06-15 DIAGNOSIS — D649 Anemia, unspecified: Secondary | ICD-10-CM | POA: Insufficient documentation

## 2014-06-15 DIAGNOSIS — R269 Unspecified abnormalities of gait and mobility: Secondary | ICD-10-CM | POA: Insufficient documentation

## 2014-06-15 DIAGNOSIS — R296 Repeated falls: Secondary | ICD-10-CM | POA: Insufficient documentation

## 2014-06-15 DIAGNOSIS — R0602 Shortness of breath: Secondary | ICD-10-CM | POA: Insufficient documentation

## 2014-06-15 NOTE — Therapy (Signed)
[facility] [address] [place], [place], [address] Phone: [phone]   Fax:  [phone]  Physical Therapy Treatment  Patient Details  Name: [name] MRN: [id number] Date of Birth: 21-May-1931 Referring Provider:  Lajean Manes, MD  Encounter Date: 06/15/2014      PT End of Session - 06/15/14 1210    Visit Number 2   Number of Visits 9   Date for PT Re-Evaluation 08/07/14   Authorization Type Medicare: G-Code required every 10th visit.   PT Start Time 1103   PT Stop Time 1155   PT Time Calculation (min) 52 min   Activity Tolerance Patient tolerated treatment well   Behavior During Therapy WFL for tasks assessed/performed      Past Medical History  Diagnosis Date  . Orthostatic lightheadedness   . History of subdural hematoma     LEFT CHRONIC SUBDURAL HEMATOMA   S/P PARIETAL CRANIOTOMY WITH EVACUATION HEMATOMA  . Atrial fibrillation, persistent   . H/O cardiac radiofrequency ablation     A-FLUTTER  . Cardiac pacemaker last check 08-30-2013    05-05-2007  DD  MEDTRONIC GUIDANT INSIGNIA  . RBBB   . Benign essential tremor   . Diverticulosis of colon   . Tachy-brady syndrome   . SSS (sick sinus syndrome)   . Recurrent prostate carcinoma     2005  S/P RADIOACTIVE SEED IMPLANTS/   BLADDER NECK MASS RESECTED 05/ 2015  SHOWED RECURRENT PROSTATE CANCER  . Hydronephrosis, bilateral   . Renal insufficiency     Past Surgical History  Procedure Laterality Date  . Tonsillectomy  as child  . Subdural hematoma evacuation via craniotomy  12-16-2007  . Rotator cuff repair w/ distal clavicle excision Left 08-31-2010  . Pacemaker insertion  05-05-2007      Guidant Insignia [employer])  . Total knee arthroplasty  12/18/2010    Procedure: TOTAL KNEE ARTHROPLASTY;  Surgeon: Mauri Pole;  Location: [facility];  Service: Orthopedics;  Laterality: Left;  . Cataract extraction    . Cystoscopy w/ ureteral stent placement  Bilateral 05/30/2013    Procedure: CYSTOSCOPY WITH RETROGRADE PYELOGRAM/ BILATERAL URETERAL STENT PLACEMENT, TRANSURETRAL RESECTION OF BLADDER TUMOR WITH GYRUS;  Surgeon: Alexis Frock, MD;  Location: [facility];  Service: Urology;  Laterality: Bilateral;  . Cardioversion N/A 08/04/2013    Procedure: CARDIOVERSION;  Surgeon: Sanda Klein, MD;  Location: [facility];  Service: Cardiovascular;  Laterality: N/A;  . Knee arthroscopy Left 02-11-2007  . Cardioversion  multiple--  last one 08-04-2013    SINCE 2008  . Cardiac electrophysiology mapping and ablation  04-06-2007  dr Caryl Comes  . Cardiac catheterization  12-22-2006      non-obstructive CAD / RCA 20%/  ef 50%/  a-fib  . Radioactive prostate seed implants  12-21-2003  . Orif left distal clavical fx  08-31-2010  . Colonoscopy w/ polypectomy  12-01-2008  . Transthoracic echocardiogram  05-31-2013    MILD LVH/  EF 65%/  MODERATE LAE/  TRIVIAL MR  &  TR  . Cystoscopy/retrograde/ureteroscopy Right 09/16/2013    Procedure: CYSTOSCOPY/RETROGRADE/URETEROSCOPIC STENT EXTRACTION/  bil retrograde pyelogram with inseertion of bilateral stents;  Surgeon: Jorja Loa, MD;  Location[facility];  Service: Urology;  Laterality: Right;    Vital Signs with Dynamap  Supine x5 min Standing x1 min Standing x3 min  BP 127/64 177/137 107/57  HR 60 42 66  Symptoms N/A "Slight sensation" in head, per pt. N/A   Vital Signs  taken Manually   Seated Standing x1 min Standing x3 min  BP 132/84 118/68 110/78  HR 63 68 72  Symptoms N/A N/A N/A   Visit Diagnosis:  Abnormality of gait  Lack of coordination  Impaired functional mobility and activity tolerance      Subjective Assessment - 06/15/14 1107    Subjective Pt reports having fallen last night when walking out of bathroom. Pt unsure of cause of fall but reports no awareness of dizziness before/after fall. Pt not injured.   Pertinent History Cancer, pacemaker, Afib   Currently in Pain?  No/denies      Treatment: Theract performed to assess/address vital signs and pt symptoms with positional change. See above for detailed findings.  Neuro Re-ed: Completed Berg Balance Scale to address functional standing balance. See below for detailed findings.       [facility] Adult PT Treatment/Exercise - 06/15/14 0001    Standardized Balance Assessment   Standardized Balance Assessment Berg Balance Test   Berg Balance Test   Sit to Stand Able to stand without using hands and stabilize independently   Standing Unsupported Able to stand 2 minutes with supervision   Sitting with Back Unsupported but Feet Supported on Floor or Stool Able to sit safely and securely 2 minutes   Stand to Sit Uses backs of legs against chair to control descent   Transfers Able to transfer with verbal cueing and /or supervision   Standing Unsupported with Eyes Closed Able to stand 10 seconds safely   Standing Ubsupported with Feet Together Able to place feet together independently and stand 1 minute safely   From Standing, Reach Forward with Outstretched Arm Can reach forward >12 cm safely (5")   From Standing Position, Pick up Object from Floor Able to pick up shoe, needs supervision   From Standing Position, Turn to Look Behind Over each Shoulder Looks behind one side only/other side shows less weight shift  less rotation to L side   Turn 360 Degrees Needs close supervision or verbal cueing  >15 seconds bilat   Standing Unsupported, Alternately Place Feet on Step/Stool Able to complete 4 steps without aid or supervision   Standing Unsupported, One Foot in [place] to take small step independently and hold 30 seconds   Standing on One Leg Able to lift leg independently and hold equal to or more than 3 seconds   Total Score 39           PT Education - 06/15/14 1209    Education provided Yes   Education Details Recommended pt contact cardiologist regarding episode in which he collapsed unexpectedly.  Explained orthostatic hypotension as well as Berg Balance Scale score, fall risk.   Person(s) Educated Patient   Methods Explanation   Comprehension Verbalized understanding          PT Short Term Goals - 06/15/14 1217    PT SHORT TERM GOAL #1   Status On-going   PT SHORT TERM GOAL #2   Status On-going   PT SHORT TERM GOAL #3   Title --  Baseline: 39/56   Status On-going          PT Long Term Goals - 06/15/14 1218    PT LONG TERM GOAL #1   Status On-going   PT LONG TERM GOAL #2   Status On-going   PT LONG TERM GOAL #3   Status On-going   PT LONG TERM GOAL #5   Status On-going   PT LONG TERM GOAL #  6   Title --  Baseline: 39/56   Status On-going          Plan - 06/15/14 1212    Clinical Impression Statement Due to pt report of having collapsed unexpectedly last night (while walking from bathroom after brushing teeth), assessed vital signs with positional change, static standing. Despite significant fluctuations in BP with supine <> sit <> stand, pt asymptomatic. Will contact referring physician. Recommended pt follow up with cadiologist.   PT Next Visit Plan Check to see if pt followed up with cardiologist on fall/collapse on 5/31. Address negotiation of stairs and standard curb step; initiate HEP.    Consulted and Agree with Plan of Care Patient      Problem List Patient Active Problem List   Diagnosis Date Noted  . UTI (lower urinary tract infection) 07/10/2013  . Sepsis 07/10/2013  . Prostate cancer 06/02/2013  . Unspecified constipation 06/02/2013  . Bladder mass 06/01/2013  . ARF (acute renal failure) 05/29/2013  . Hyperkalemia 05/29/2013  . Acute renal failure 05/28/2013  . Anemia 05/28/2013  . Atrial tachycardia 10/23/2011  . S/P left total knee replacement 12/19/2010  . Pacemaker-dual-chamber-[employer] 06/07/2010  . Orthostatic lightheadedness 06/07/2010  . Mitral regurgitation 06/07/2010  . AV BLOCK, COMPLETE 04/25/2009  . ATRIAL  FIBRILLATION-paroxysmal 05/12/2008  . SICK SINUS/ TACHY-BRADY SYNDROME 05/12/2008    Billie Ruddy, PT, DPT [facility] [address] [place], [place], [address] Phone: [phone]   Fax:  [phone] 06/15/2014, 1:55 PM

## 2014-06-15 NOTE — Telephone Encounter (Signed)
During PT session, patient subjectively reporting having "collapsed" when walking from bathroom to bedroom last night. Pt was uninjured and reports no dizziness or lightheadedness before or after said collapse. Pt is asymptomatic today. See below for vital signs taken during today's therapy session. I recommended for pt follow up with his cardiologist.  Vital Signs with Dynamap  Supine x5 min Standing x1 min Standing x3 min  BP 127/64 177/137 107/57  HR 60 42 66  Symptoms N/A "Slight sensation" in head, per pt. N/A   Vital Signs taken Manually   Seated Standing x1 min Standing x3 min  BP 132/84 118/68 110/78  HR 63 68 72  Symptoms N/A N/A N/A

## 2014-06-16 ENCOUNTER — Encounter (HOSPITAL_COMMUNITY): Payer: Self-pay

## 2014-06-16 ENCOUNTER — Ambulatory Visit (HOSPITAL_COMMUNITY)
Admission: RE | Admit: 2014-06-16 | Discharge: 2014-06-16 | Disposition: A | Discharge status: Home / Self Care | Payer: PPO | Source: Ambulatory Visit | Attending: Oncology | Admitting: Oncology

## 2014-06-16 DIAGNOSIS — D631 Anemia in chronic kidney disease: Secondary | ICD-10-CM | POA: Diagnosis not present

## 2014-06-16 DIAGNOSIS — D6489 Other specified anemias: Secondary | ICD-10-CM | POA: Diagnosis present

## 2014-06-16 DIAGNOSIS — N189 Chronic kidney disease, unspecified: Secondary | ICD-10-CM

## 2014-06-16 LAB — BONE MARROW EXAM

## 2014-06-16 LAB — APTT: aPTT: 36 seconds (ref 24–37)

## 2014-06-16 LAB — CBC
HCT: 27.3 % — ABNORMAL LOW (ref 39.0–52.0)
Hemoglobin: 8.3 g/dL — ABNORMAL LOW (ref 13.0–17.0)
MCH: 23.8 pg — AB (ref 26.0–34.0)
MCHC: 30.4 g/dL (ref 30.0–36.0)
MCV: 78.2 fL (ref 78.0–100.0)
Platelets: 223 10*3/uL (ref 150–400)
RBC: 3.49 MIL/uL — AB (ref 4.22–5.81)
RDW: 17.2 % — ABNORMAL HIGH (ref 11.5–15.5)
WBC: 4.6 10*3/uL (ref 4.0–10.5)

## 2014-06-16 LAB — PROTIME-INR
INR: 1.78 — AB (ref 0.00–1.49)
Prothrombin Time: 20.6 seconds — ABNORMAL HIGH (ref 11.6–15.2)

## 2014-06-16 MED ORDER — MIDAZOLAM HCL 2 MG/2ML IJ SOLN
INTRAMUSCULAR | Status: AC
  Filled 2014-06-16: qty 2

## 2014-06-16 MED ORDER — FENTANYL CITRATE (PF) 100 MCG/2ML IJ SOLN
INTRAMUSCULAR | Status: AC
  Filled 2014-06-16: qty 2

## 2014-06-16 MED ORDER — MIDAZOLAM HCL 2 MG/2ML IJ SOLN
INTRAMUSCULAR | Status: AC | PRN
  Administered 2014-06-16: 0.5 mg via INTRAVENOUS

## 2014-06-16 MED ORDER — HYDROCODONE-ACETAMINOPHEN 5-325 MG PO TABS: 1.0000 | ORAL_TABLET | ORAL | Status: DC | PRN

## 2014-06-16 MED ORDER — FENTANYL CITRATE (PF) 100 MCG/2ML IJ SOLN
INTRAMUSCULAR | Status: AC | PRN
  Administered 2014-06-16: 25 ug via INTRAVENOUS

## 2014-06-16 MED ORDER — SODIUM CHLORIDE 0.9 % IV SOLN
INTRAVENOUS | Status: DC
  Administered 2014-06-16: 10:00:00 via INTRAVENOUS

## 2014-06-16 NOTE — Procedures (Signed)
CT-guided  R iliac bone marrow aspiration and core biopsy No complication No blood loss. See complete dictation in [facility] PACS  

## 2014-06-16 NOTE — H&P (Signed)
Chief Complaint: "I'm here for a bone marrow biopsy"  Referring Physician(s): Shadad,Firas N  History of Present Illness: [name] is a 81 y.o. male with history of prostate cancer, atrial fibrillation, renal insufficiency and multifactorial anemia. He has had  a persistent decline in his hemoglobin despite iron supplementation and growth factor support. He presents today for CT-guided bone marrow biopsy for further evaluation.  Past Medical History  Diagnosis Date  . Orthostatic lightheadedness   . History of subdural hematoma     LEFT CHRONIC SUBDURAL HEMATOMA   S/P PARIETAL CRANIOTOMY WITH EVACUATION HEMATOMA  . Atrial fibrillation, persistent   . H/O cardiac radiofrequency ablation     A-FLUTTER  . Cardiac pacemaker last check 08-30-2013    05-05-2007  DD  MEDTRONIC GUIDANT INSIGNIA  . RBBB   . Benign essential tremor   . Diverticulosis of colon   . Tachy-brady syndrome   . SSS (sick sinus syndrome)   . Recurrent prostate carcinoma     2005  S/P RADIOACTIVE SEED IMPLANTS/   BLADDER NECK MASS RESECTED 05/ 2015  SHOWED RECURRENT PROSTATE CANCER  . Hydronephrosis, bilateral   . Renal insufficiency     Past Surgical History  Procedure Laterality Date  . Tonsillectomy  as child  . Subdural hematoma evacuation via craniotomy  12-16-2007  . Rotator cuff repair w/ distal clavicle excision Left 08-31-2010  . Pacemaker insertion  05-05-2007      Guidant Insignia [employer])  . Total knee arthroplasty  12/18/2010    Procedure: TOTAL KNEE ARTHROPLASTY;  Surgeon: Mauri Pole;  Location: [facility];  Service: Orthopedics;  Laterality: Left;  . Cataract extraction    . Cystoscopy w/ ureteral stent placement Bilateral 05/30/2013    Procedure: CYSTOSCOPY WITH RETROGRADE PYELOGRAM/ BILATERAL URETERAL STENT PLACEMENT, TRANSURETRAL RESECTION OF BLADDER TUMOR WITH GYRUS;  Surgeon: Alexis Frock, MD;  Location: [facility];  Service: Urology;  Laterality: Bilateral;  . Cardioversion N/A  08/04/2013    Procedure: CARDIOVERSION;  Surgeon: Sanda Klein, MD;  [facility];  Service: Cardiovascular;  Laterality: N/A;  . Knee arthroscopy Left 02-11-2007  . Cardioversion  multiple--  last one 08-04-2013    SINCE 2008  . Cardiac electrophysiology mapping and ablation  04-06-2007  dr Caryl Comes  . Cardiac catheterization  12-22-2006      non-obstructive CAD / RCA 20%/  ef 50%/  a-fib  . Radioactive prostate seed implants  12-21-2003  . Orif left distal clavical fx  08-31-2010  . Colonoscopy w/ polypectomy  12-01-2008  . Transthoracic echocardiogram  05-31-2013    MILD LVH/  EF 65%/  MODERATE LAE/  TRIVIAL MR  &  TR  . Cystoscopy/retrograde/ureteroscopy Right 09/16/2013    Procedure: CYSTOSCOPY/RETROGRADE/URETEROSCOPIC STENT EXTRACTION/  bil retrograde pyelogram with inseertion of bilateral stents;  Surgeon: Jorja Loa, MD;  Location: [facility];  Service: Urology;  Laterality: Right;    Allergies: Review of patient's allergies indicates no known allergies.  Medications: Prior to Admission medications   Medication Sig Start Date End Date Taking? Authorizing Provider  calcium citrate-vitamin D (CITRACAL+D) 315-200 MG-UNIT per tablet Take 1 tablet by mouth 2 (two) times daily.   Yes Historical Provider, MD  Glucosamine-Chondroitin 1500-1200 MG/30ML LIQD Take 30 mLs by mouth daily.   Yes Historical Provider, MD  Inulin (FIBER CHOICE PO) Take 2 capsules by mouth daily.    Yes Historical Provider, MD  levothyroxine (SYNTHROID, LEVOTHROID) 137 MCG tablet Take 137 mcg by mouth daily before breakfast.  Yes Historical Provider, MD  megestrol (MEGACE) 20 MG tablet Take 20 mg by mouth 2 (two) times daily.   Yes Historical Provider, MD  Melatonin 3 MG TABS Take 2 tablets by mouth at bedtime. Takes again @ 4 am as needed.   Yes Historical Provider, MD  Methylfol-Methylcob-Acetylcyst (CEREFOLIN NAC) 6-2-600 MG TABS Take one tablet by mouth one time daily 09/05/13   Yes Garvin Fila, MD  Multiple Vitamin (MULTIVITAMIN WITH MINERALS) TABS tablet Take 1 tablet by mouth daily.   Yes Historical Provider, MD  nitrofurantoin, macrocrystal-monohydrate, (MACROBID) 100 MG capsule Take 100 mg by mouth 2 (two) times daily. 01/29/14  Yes Historical Provider, MD  Omega-3 Krill Oil 300 MG CAPS Take 300 mg by mouth daily.   Yes Historical Provider, MD  propafenone (RYTHMOL) 225 MG tablet TAKE 1 TABLET (225 MG TOTAL) BY MOUTH 2 (TWO) TIMES DAILY. 06/06/14  Yes Deboraha Sprang, MD  propranolol ER (INDERAL LA) 60 MG 24 hr capsule Take 60 mg by mouth daily at 2 am.   Yes Historical Provider, MD  rivaroxaban (XARELTO) 15 MG TABS tablet Take 1 tablet (15 mg total) by mouth daily with supper. Patient taking differently: Take 15 mg by mouth every evening.  05/09/14  Yes Deboraha Sprang, MD  solifenacin (VESICARE) 5 MG tablet Take 5 mg by mouth daily.   Yes Historical Provider, MD  tamsulosin (FLOMAX) 0.4 MG CAPS capsule Take 0.4 mg by mouth daily.   Yes Historical Provider, MD  zolpidem (AMBIEN) 10 MG tablet Take 10 mg by mouth at bedtime as needed for sleep.  03/29/14  Yes Historical Provider, MD    Family History  Problem Relation Age of Onset  . Aneurysm Father   . Stroke Father     History   Social History  . Marital Status: Married    Spouse Name: margaret  . Number of Children: 2  . Years of Education: BA   Occupational History  . retired    Social History Main Topics  . Smoking status: Never Smoker   . Smokeless tobacco: Never Used  . Alcohol Use: 12.6 oz/week    7 Glasses of wine, 7 Standard drinks or equivalent, 7 Shots of liquor per week     Comment: 2 glasses wine day  . Drug Use: No  . Sexual Activity: Not on file   Other Topics Concern  . Not on file   Social History Narrative   Patient lives at home with his spouse.   Caffeine Use: 2 cups of coffee     Review of Systems  Constitutional: Positive for fatigue. Negative for fever and chills.    Respiratory: Negative for cough and shortness of breath.   Cardiovascular: Negative for chest pain.  Gastrointestinal: Negative for nausea, vomiting, abdominal pain and blood in stool.  Genitourinary: Positive for hematuria. Negative for dysuria.  Musculoskeletal: Negative for back pain.  Neurological: Negative for headaches.    Vital Signs: Blood pressure 139/77   temperature 97.3  heart rate 59 respirations 16   oxygen saturations 100% room air   Physical Exam  Constitutional: He is oriented to person, place, and time. He appears well-developed and well-nourished.  Cardiovascular: Normal rate and regular rhythm.   Left chest wall pacer  Pulmonary/Chest: Effort normal and breath sounds normal.  Mildly dim BS bases  Abdominal: Soft. Bowel sounds are normal. There is no tenderness.  Musculoskeletal: Normal range of motion. He exhibits no edema.  Neurological: He is alert and  oriented to person, place, and time.    Imaging: No results found.  Labs:  CBC:  Recent Labs  04/27/14 1446 05/17/14 1444 06/07/14 1025 06/16/14 0945  WBC 5.6 5.0 4.7 4.6  HGB 7.6* 7.6* 6.0* 8.3*  HCT 25.2* 24.8* 20.3* 27.3*  PLT 235 222 209 223    COAGS:  Recent Labs  06/16/14 0945  INR 1.78*  APTT 36    BMP:  Recent Labs  07/09/13 1840 07/10/13 0610 07/11/13 0525 07/12/13 0355 07/30/13 1147 09/16/13 0852 01/27/14 1426  NA 133* 134* 134* 136* 138 141 137  K 4.1 4.2 3.7 3.8 4.7 4.4 4.2  CL 98 97 100 103 106 105  --   CO2 _0 --  21*  GLUCOSE 132* 120* 108* 103* 79 87 106  BUN 32* 28* 23 18 31* 30* 35.5*  CALCIUM 8.8 8.7 7.8* 8.0* 9.4  --  9.1  CREATININE 1.22 1.24 1.36* 1.31 1.4 1.20 1.7*  GFRNONAA 54* 53* 48* 50*  --   --   --   GFRAA 63* 62* 55* 58*  --   --   --     LIVER FUNCTION TESTS:  Recent Labs  07/09/13 1840 07/10/13 0610 07/12/13 0355 01/27/14 1426  BILITOT 0.4 0.6 0.3 0.38  AST _1 ALT _2 ALKPHOS 83 70 61 71  PROT 6.9  6.5 5.5* 6.8  ALBUMIN 3.6 3.5 2.6* 3.8    TUMOR MARKERS: No results for input(s): AFPTM, CEA, CA199, CHROMGRNA in the last 8760 hours.  Assessment and Plan: [name] is a 81 y.o. male with history of prostate cancer, atrial fibrillation, renal insufficiency and multifactorial anemia. He has had  a persistent decline in his hemoglobin despite iron supplementation and growth factor support. He presents today for CT-guided bone marrow biopsy for further evaluation.Risks and benefits discussed with the patient including, but not limited to bleeding, infection, damage to adjacent structures or low yield requiring additional tests. All of the patient's questions were answered, patient is agreeable to proceed. Consent signed and in chart.    Signed: D. Rowe Robert 06/16/2014, 10:14 AM   I spent a total of 20 minutes face to face in clinical consultation, greater than 50% of which was counseling/coordinating care for CT guided bone marrow biopsy

## 2014-06-16 NOTE — Discharge Instructions (Signed)
Bone Marrow Aspiration and Bone Biopsy  Call [facility] at [phone] for any problems or questions. Leave dressing on for 24 hours then may remove. Tylenol if needed for pain if no relief call above number . If profuse bleeding apply pressure to site and call above number.  Examination of the bone marrow is a valuable test to diagnose blood disorders. A bone marrow biopsy takes a sample of bone and a small amount of fluid and cells from inside the bone. A bone marrow aspiration removes only the marrow. Bone marrow aspiration and bone biopsies are used to stage different disorders of the blood, such as leukemia. Staging will help your caregiver understand how far the disease has progressed.  The tests are also useful in diagnosing:  Fever of unknown origin (FUO).  Bacterial infections and other widespread fungal infections.  Cancers that have spread (metastasized) to the bone marrow.  Diseases that are characterized by a deficiency of an enzyme (storage diseases). This includes:  Niemann-Pick disease.  Gaucher disease. PROCEDURE  Sites used to get samples include:   Back of your hip bone (posterior iliac crest).  Both aspiration and biopsy.  Front of your hip bone (anterior iliac crest).  Both aspiration and biopsy.  Breastbone (sternum).  Aspiration from your breastbone (done only in adults). This method is rarely used. When you get a hip bone aspiration:  You are placed lying on your side with the upper knee brought up and flexed with the lower leg straight.  The site is prepared, cleaned with an antiseptic scrub, and draped. This keeps the biopsy area clean.  The skin and the area down to the lining of the bone (periosteum) are made numb with a local anesthetic.  The bone marrow aspiration needle is inserted. You will feel pressure on your bone.  Once inside the marrow cavity, a sample of bone marrow is sucked out (aspirated) for pathology slides.  The material  collected for bone marrow slides is processed immediately by a technologist.  The technician selects the marrow particles to make the slides for pathology.  The marrow aspiration needle is removed. Then pressure is applied to the site with gauze until bleeding has stopped. Following an aspiration, a bone marrow biopsy may be performed as well. The technique for this is very similar. A dressing is then applied.  RISKS AND COMPLICATIONS  The main complications of a bone marrow aspiration and biopsy include infection and bleeding.  Complications are uncommon. The procedure may not be performed in patients with bleeding tendencies.  A very rare complication from the procedure is injury to the heart during a breastbone (sternal) marrow aspiration. Only bone marrow aspirations are performed in this area.  Long-lasting pain at the site of the bone marrow aspiration and biopsy is uncommon. Your caregiver will let you know when you are to get your results and will discuss them with you. You may make an appointment with your caregiver to find out the results. Do not assume everything is normal if you have not heard from your caregiver or the medical facility. It is important for you to follow up on all of your test results. Document Released: 01/04/2004 Document Revised: 03/25/2011 Document Reviewed: 12/29/2007 [facility] Patient Information 2015 [place], [place]. This information is not intended to replace advice given to you by your health care provider. Make sure you discuss any questions you have with your health care provider. Conscious Sedation Sedation is the use of medicines to promote relaxation and relieve  discomfort and anxiety. Conscious sedation is a type of sedation. Under conscious sedation you are less alert than normal but are still able to respond to instructions or stimulation. Conscious sedation is used during short medical and dental procedures. It is milder than deep sedation or general  anesthesia and allows you to return to your regular activities sooner.  LET [facility] CARE PROVIDER KNOW ABOUT:   Any allergies you have.  All medicines you are taking, including vitamins, herbs, eye drops, creams, and over-the-counter medicines.  Use of steroids (by mouth or creams).  Previous problems you or members of your family have had with the use of anesthetics.  Any blood disorders you have.  Previous surgeries you have had.  Medical conditions you have.  Possibility of pregnancy, if this applies.  Use of cigarettes, alcohol, or illegal drugs. RISKS AND COMPLICATIONS Generally, this is a safe procedure. However, as with any procedure, problems can occur. Possible problems include:  Oversedation.  Trouble breathing on your own. You may need to have a breathing tube until you are awake and breathing on your own.  Allergic reaction to any of the medicines used for the procedure. BEFORE THE PROCEDURE  You may have blood tests done. These tests can help show how well your kidneys and liver are working. They can also show how well your blood clots.  A physical exam will be done.  Only take medicines as directed by your health care provider. You may need to stop taking medicines (such as blood thinners, aspirin, or nonsteroidal anti-inflammatory drugs) before the procedure.   Do not eat or drink at least 6 hours before the procedure or as directed by your health care provider.  Arrange for a responsible adult, family member, or friend to take you home after the procedure. He or she should stay with you for at least 24 hours after the procedure, until the medicine has worn off. PROCEDURE   An intravenous (IV) catheter will be inserted into one of your veins. Medicine will be able to flow directly into your body through this catheter. You may be given medicine through this tube to help prevent pain and help you relax.  The medical or dental procedure will be  done. AFTER THE PROCEDURE  You will stay in a recovery area until the medicine has worn off. Your blood pressure and pulse will be checked.   Depending on the procedure you had, you may be allowed to go home when you can tolerate liquids and your pain is under control. Document Released: 09/25/2000 Document Revised: 01/05/2013 Document Reviewed: 09/07/2012 [facility] Patient Information 2015 [place], [place]. This information is not intended to replace advice given to you by your health care provider. Make sure you discuss any questions you have with your health care provider.

## 2014-06-22 ENCOUNTER — Ambulatory Visit: Payer: PPO | Admitting: Physical Therapy

## 2014-06-22 DIAGNOSIS — Z7409 Other reduced mobility: Secondary | ICD-10-CM

## 2014-06-22 DIAGNOSIS — R279 Unspecified lack of coordination: Secondary | ICD-10-CM

## 2014-06-22 DIAGNOSIS — R531 Weakness: Secondary | ICD-10-CM

## 2014-06-22 DIAGNOSIS — R293 Abnormal posture: Secondary | ICD-10-CM

## 2014-06-22 DIAGNOSIS — R269 Unspecified abnormalities of gait and mobility: Secondary | ICD-10-CM | POA: Diagnosis not present

## 2014-06-22 NOTE — Patient Instructions (Signed)

## 2014-06-22 NOTE — Therapy (Signed)
[facility] [address] [place], [place], [address] Phone: [phone]   Fax:  [phone]  Physical Therapy Treatment  Patient Details  Name: [name] MRN: [id number] Date of Birth: 1931-10-18 Referring Provider:  Lajean Manes, MD  Encounter Date: 06/22/2014      PT End of Session - 06/22/14 1242    Visit Number 3   Number of Visits 9   Date for PT Re-Evaluation 08/07/14   Authorization Type Medicare: G-Code required every 10th visit.   PT Start Time 1021   PT Stop Time 1102   PT Time Calculation (min) 41 min   Equipment Utilized During Treatment Gait belt   Activity Tolerance Patient tolerated treatment well   Behavior During Therapy WFL for tasks assessed/performed      Past Medical History  Diagnosis Date  . Orthostatic lightheadedness   . History of subdural hematoma     LEFT CHRONIC SUBDURAL HEMATOMA   S/P PARIETAL CRANIOTOMY WITH EVACUATION HEMATOMA  . Atrial fibrillation, persistent   . H/O cardiac radiofrequency ablation     A-FLUTTER  . Cardiac pacemaker last check 08-30-2013    05-05-2007  DD  [employer]TRONIC GUIDANT INSIGNIA  . RBBB   . Benign essential tremor   . Diverticulosis of colon   . Tachy-brady syndrome   . SSS (sick sinus syndrome)   . Hydronephrosis, bilateral   . Renal insufficiency   . Recurrent prostate carcinoma     2005  S/P RADIOACTIVE SEED IMPLANTS/   BLADDER NECK MASS RESECTED 05/ 2015  SHOWED RECURRENT PROSTATE CANCER    Past Surgical History  Procedure Laterality Date  . Tonsillectomy  as child  . Subdural hematoma evacuation via craniotomy  12-16-2007  . Rotator cuff repair w/ distal clavicle excision Left 08-31-2010  . Pacemaker insertion  05-05-2007      Guidant Insignia [employer])  . Total knee arthroplasty  12/18/2010    Procedure: TOTAL KNEE ARTHROPLASTY;  Surgeon: Mauri Pole;  Location: [facility];  Service: Orthopedics;  Laterality: Left;  . Cataract  extraction    . Cystoscopy w/ ureteral stent placement Bilateral 05/30/2013    Procedure: CYSTOSCOPY WITH RETROGRADE PYELOGRAM/ BILATERAL URETERAL STENT PLACEMENT, TRANSURETRAL RESECTION OF BLADDER TUMOR WITH GYRUS;  Surgeon: Alexis Frock, MD;  Location: [facility];  Service: Urology;  Laterality: Bilateral;  . Cardioversion N/A 08/04/2013    Procedure: CARDIOVERSION;  Surgeon: Sanda Klein, MD;  [facility];  Service: Cardiovascular;  Laterality: N/A;  . Knee arthroscopy Left 02-11-2007  . Cardioversion  multiple--  last one 08-04-2013    SINCE 2008  . Cardiac electrophysiology mapping and ablation  04-06-2007  dr Caryl Comes  . Cardiac catheterization  12-22-2006      non-obstructive CAD / RCA 20%/  ef 50%/  a-fib  . Radioactive prostate seed implants  12-21-2003  . Orif left distal clavical fx  08-31-2010  . Colonoscopy w/ polypectomy  12-01-2008  . Transthoracic echocardiogram  05-31-2013    MILD LVH/  EF 65%/  MODERATE LAE/  TRIVIAL MR  &  TR  . Cystoscopy/retrograde/ureteroscopy Right 09/16/2013    Procedure: CYSTOSCOPY/RETROGRADE/URETEROSCOPIC STENT EXTRACTION/  bil retrograde pyelogram with inseertion of bilateral stents;  Surgeon: Jorja Loa, MD;  Location: [facility];  Service: Urology;  Laterality: Right;    There were no vitals filed for this visit.  Visit Diagnosis:  Lack of coordination  Impaired functional mobility and activity tolerance  Weakness generalized  Abnormal posture  Subjective Assessment - 06/22/14 1024    Subjective Pt reports no falls, no collapsing episodes. Pt reports having recently had sleep study and will likely need CPAP due to suspected sleep apnea. Pt did not speak with PCP or cardiologist since last session but does have an appt this Friday with PCP, Dr. Felipa Eth.   Pertinent History Cancer, pacemaker, Afib   Patient Stated Goals "Balance is the critical concern we have. I'm also taking little short steps and  would like to improve the strength in my legs and overall physical health."   Currently in Pain? No/denies          Treatment: Therapeutic Exercises: - [place] HEP initiated; see below for details on reps, UE support. PT cueing required for techniques, repetitions assigned for HEP.   Therapeutic Activities: - Fall prevention strategies. See Patient Instructions for further detail. - Explained, demonstrated safe use of assistive device with transfers. Pt may require reinforcement in future sessions.         Balance Exercises - 06/22/14 1233    OTAGO PROGRAM   Head Movements 5 reps   Neck Movements 5 reps   Back Extension 5 reps   Trunk Movements 5 reps   Knee Extensor Other reps (comment)  15 reps   Knee Flexor 10 reps   Hip ABductor Other reps (comment)  15 reps   Ankle Plantorflexors 20 reps, support   Ankle Dorsiflexors 20 reps, support   Knee Bends 10 reps, support           PT Education - 06/22/14 1237    Education provided Yes   Education Details Recommended pt discuss recent collapse with MD during appt this Friday. Fall prevent strategies. Initiated [employer]. Safe/correct use of rolling walker with transfers.   Person(s) Educated Patient   Methods Explanation;Demonstration;Handout   Comprehension Verbalized understanding;Returned demonstration          PT Short Term Goals - 06/15/14 1217    PT SHORT TERM GOAL #1   Status On-going   PT SHORT TERM GOAL #2   Status On-going   PT SHORT TERM GOAL #3   Title --  Baseline: 39/56   Status On-going           PT Long Term Goals - 06/22/14 1243    PT LONG TERM GOAL #1   Title Pt will perform TUG in 13.5 seconds or less to demonstrate decreased risk of falling. Target date: 08/07/14.   Status On-going   PT LONG TERM GOAL #2   Title Pt will exhibit gait speed of 2.86 ft/sec to demonstrate significantly improved stability with functional mobility. Target date: 08/07/14.   Status On-going   PT LONG  TERM GOAL #3   Title Pt willl demonstrate FOTO measure of 52 to demonstrate significant change in patient-perceived functional status. Target date: 08/07/14.   Status On-going   PT LONG TERM GOAL #4   Title Pt will ambulate 500' over level and unlevel surfaces with LRAD and mod I to demonstrate increased safety with community ambulation. Target date: 08/07/14.   PT LONG TERM GOAL #5   Title Pt will negotiate standard curb step with LRAD and mod I to meet pt-stated goal and exhibit safety with community mobility. Target date: 08/07/14.   Status On-going   PT LONG TERM GOAL #6   Title Pt will increase Berg Balance Scale score by 6 points from initial assessment to demonstrate improved functional standing balance. Target date: 08/07/14.  Baseline: 39/56  Status On-going               Plan - 06/22/14 1238    Clinical Impression Statement Session focusd on initiation of [place] HEP, introducing fall prevention strategies. Pt receptive to all education and motivated to start HEP. No further syncopal episodes reported. Continue per POC.   PT Next Visit Plan Finish instructing pt on [place]. Address negotiation of stairs and standard curb step.   PT Home Exercise Plan [place] HEP   Consulted and Agree with Plan of Care Patient        Problem List Patient Active Problem List   Diagnosis Date Noted  . Anemia in chronic kidney disease (CKD)   . UTI (lower urinary tract infection) 07/10/2013  . Sepsis 07/10/2013  . Prostate cancer 06/02/2013  . Unspecified constipation 06/02/2013  . Bladder mass 06/01/2013  . ARF (acute renal failure) 05/29/2013  . Hyperkalemia 05/29/2013  . Acute renal failure 05/28/2013  . Anemia 05/28/2013  . Atrial tachycardia 10/23/2011  . S/P left total knee replacement 12/19/2010  . Pacemaker-dual-chamber-[employer] 06/07/2010  . Orthostatic lightheadedness 06/07/2010  . Mitral regurgitation 06/07/2010  . AV BLOCK, COMPLETE 04/25/2009  . ATRIAL  FIBRILLATION-paroxysmal 05/12/2008  . SICK SINUS/ TACHY-BRADY SYNDROME 05/12/2008   Billie Ruddy, PT, DPT [facility] [address] [place], [place], [address] Phone: [phone]   Fax:  [phone] 06/22/2014, 12:44 PM

## 2014-06-23 ENCOUNTER — Other Ambulatory Visit (HOSPITAL_BASED_OUTPATIENT_CLINIC_OR_DEPARTMENT_OTHER): Payer: PPO

## 2014-06-23 ENCOUNTER — Other Ambulatory Visit: Payer: Self-pay | Admitting: *Deleted

## 2014-06-23 ENCOUNTER — Ambulatory Visit (HOSPITAL_BASED_OUTPATIENT_CLINIC_OR_DEPARTMENT_OTHER): Payer: PPO

## 2014-06-23 ENCOUNTER — Ambulatory Visit (HOSPITAL_COMMUNITY)
Admission: RE | Admit: 2014-06-23 | Discharge: 2014-06-23 | Disposition: A | Discharge status: Home / Self Care | Payer: PPO | Source: Ambulatory Visit | Attending: Oncology | Admitting: Oncology

## 2014-06-23 VITALS — BP 179/77 | HR 57 | Temp 97.0°F | Resp 18

## 2014-06-23 DIAGNOSIS — D649 Anemia, unspecified: Secondary | ICD-10-CM

## 2014-06-23 DIAGNOSIS — N189 Chronic kidney disease, unspecified: Secondary | ICD-10-CM | POA: Diagnosis not present

## 2014-06-23 DIAGNOSIS — D631 Anemia in chronic kidney disease: Secondary | ICD-10-CM

## 2014-06-23 DIAGNOSIS — R531 Weakness: Secondary | ICD-10-CM | POA: Diagnosis not present

## 2014-06-23 DIAGNOSIS — N179 Acute kidney failure, unspecified: Secondary | ICD-10-CM

## 2014-06-23 DIAGNOSIS — N170 Acute kidney failure with tubular necrosis: Secondary | ICD-10-CM

## 2014-06-23 DIAGNOSIS — R0602 Shortness of breath: Secondary | ICD-10-CM | POA: Diagnosis not present

## 2014-06-23 LAB — CBC WITH DIFFERENTIAL/PLATELET
BASO%: 0.4 % (ref 0.0–2.0)
BASOS ABS: 0 10*3/uL (ref 0.0–0.1)
EOS%: 1.4 % (ref 0.0–7.0)
Eosinophils Absolute: 0.1 10*3/uL (ref 0.0–0.5)
HEMATOCRIT: 27.2 % — AB (ref 38.4–49.9)
HGB: 8.1 g/dL — ABNORMAL LOW (ref 13.0–17.1)
LYMPH%: 21.9 % (ref 14.0–49.0)
MCH: 23.5 pg — ABNORMAL LOW (ref 27.2–33.4)
MCHC: 29.8 g/dL — ABNORMAL LOW (ref 32.0–36.0)
MCV: 79.1 fL — ABNORMAL LOW (ref 79.3–98.0)
MONO#: 0.5 10*3/uL (ref 0.1–0.9)
MONO%: 9.4 % (ref 0.0–14.0)
NEUT#: 3.7 10*3/uL (ref 1.5–6.5)
NEUT%: 66.9 % (ref 39.0–75.0)
Platelets: 235 10*3/uL (ref 140–400)
RBC: 3.44 10*6/uL — ABNORMAL LOW (ref 4.20–5.82)
RDW: 17.7 % — ABNORMAL HIGH (ref 11.0–14.6)
WBC: 5.5 10*3/uL (ref 4.0–10.3)
lymph#: 1.2 10*3/uL (ref 0.9–3.3)

## 2014-06-23 LAB — PREPARE RBC (CROSSMATCH)

## 2014-06-23 LAB — HOLD TUBE, BLOOD BANK

## 2014-06-23 MED ORDER — SODIUM CHLORIDE 0.9 % IV SOLN
250.0000 mL | Freq: Once | INTRAVENOUS | Status: AC
  Administered 2014-06-23: 250 mL via INTRAVENOUS

## 2014-06-23 MED ORDER — DIPHENHYDRAMINE HCL 25 MG PO CAPS
25.0000 mg | ORAL_CAPSULE | Freq: Once | ORAL | Status: AC
  Administered 2014-06-23: 25 mg via ORAL

## 2014-06-23 MED ORDER — DIPHENHYDRAMINE HCL 25 MG PO CAPS
ORAL_CAPSULE | ORAL | Status: AC
  Filled 2014-06-23: qty 1

## 2014-06-23 MED ORDER — ACETAMINOPHEN 325 MG PO TABS
650.0000 mg | ORAL_TABLET | Freq: Once | ORAL | Status: AC
  Administered 2014-06-23: 650 mg via ORAL

## 2014-06-23 MED ORDER — DARBEPOETIN ALFA 300 MCG/0.6ML IJ SOSY
300.0000 ug | PREFILLED_SYRINGE | Freq: Once | INTRAMUSCULAR | Status: AC
  Administered 2014-06-23: 300 ug via SUBCUTANEOUS
  Filled 2014-06-23: qty 0.6

## 2014-06-23 MED ORDER — ACETAMINOPHEN 325 MG PO TABS
ORAL_TABLET | ORAL | Status: AC
  Filled 2014-06-23: qty 2

## 2014-06-23 NOTE — Patient Instructions (Signed)

## 2014-06-23 NOTE — Patient Instructions (Signed)
Darbepoetin Alfa injection What is this medicine? DARBEPOETIN ALFA (dar be POE e tin AL fa) helps your body make more red blood cells. It is used to treat anemia caused by chronic kidney failure and chemotherapy. This medicine may be used for other purposes; ask your health care provider or pharmacist if you have questions. COMMON BRAND NAME(S): Aranesp What should I tell my health care provider before I take this medicine? They need to know if you have any of these conditions: -blood clotting disorders or history of blood clots -cancer patient not on chemotherapy -cystic fibrosis -heart disease, such as angina, heart failure, or a history of a heart attack -hemoglobin level of 12 g/dL or greater -high blood pressure -low levels of folate, iron, or vitamin B12 -seizures -an unusual or allergic reaction to darbepoetin, erythropoietin, albumin, hamster proteins, latex, other medicines, foods, dyes, or preservatives -pregnant or trying to get pregnant -breast-feeding How should I use this medicine? This medicine is for injection into a vein or under the skin. It is usually given by a health care professional in a hospital or clinic setting. If you get this medicine at home, you will be taught how to prepare and give this medicine. Do not shake the solution before you withdraw a dose. Use exactly as directed. Take your medicine at regular intervals. Do not take your medicine more often than directed. It is important that you put your used needles and syringes in a special sharps container. Do not put them in a trash can. If you do not have a sharps container, call your pharmacist or healthcare provider to get one. Talk to your pediatrician regarding the use of this medicine in children. While this medicine may be used in children as young as 1 year for selected conditions, precautions do apply. Overdosage: If you think you have taken too much of this medicine contact a poison control center or  emergency room at once. NOTE: This medicine is only for you. Do not share this medicine with others. What if I miss a dose? If you miss a dose, take it as soon as you can. If it is almost time for your next dose, take only that dose. Do not take double or extra doses. What may interact with this medicine? Do not take this medicine with any of the following medications: -epoetin alfa This list may not describe all possible interactions. Give your health care provider a list of all the medicines, herbs, non-prescription drugs, or dietary supplements you use. Also tell them if you smoke, drink alcohol, or use illegal drugs. Some items may interact with your medicine. What should I watch for while using this medicine? Visit your prescriber or health care professional for regular checks on your progress and for the needed blood tests and blood pressure measurements. It is especially important for the doctor to make sure your hemoglobin level is in the desired range, to limit the risk of potential side effects and to give you the best benefit. Keep all appointments for any recommended tests. Check your blood pressure as directed. Ask your doctor what your blood pressure should be and when you should contact him or her. As your body makes more red blood cells, you may need to take iron, folic acid, or vitamin B supplements. Ask your doctor or health care provider which products are right for you. If you have kidney disease continue dietary restrictions, even though this medication can make you feel better. Talk with your doctor or health   care professional about the foods you eat and the vitamins that you take. What side effects may I notice from receiving this medicine? Side effects that you should report to your doctor or health care professional as soon as possible: -allergic reactions like skin rash, itching or hives, swelling of the face, lips, or tongue -breathing problems -changes in vision -chest  pain -confusion, trouble speaking or understanding -feeling faint or lightheaded, falls -high blood pressure -muscle aches or pains -pain, swelling, warmth in the leg -rapid weight gain -severe headaches -sudden numbness or weakness of the face, arm or leg -trouble walking, dizziness, loss of balance or coordination -seizures (convulsions) -swelling of the ankles, feet, hands -unusually weak or tired Side effects that usually do not require medical attention (report to your doctor or health care professional if they continue or are bothersome): -diarrhea -fever, chills (flu-like symptoms) -headaches -nausea, vomiting -redness, stinging, or swelling at site where injected This list may not describe all possible side effects. Call your doctor for medical advice about side effects. You may report side effects to FDA at 1-800-FDA-1088. Where should I keep my medicine? Keep out of the reach of children. Store in a refrigerator between 2 and 8 degrees C (36 and 46 degrees F). Do not freeze. Do not shake. Throw away any unused portion if using a single-dose vial. Throw away any unused medicine after the expiration date. NOTE: This sheet is a summary. It may not cover all possible information. If you have questions about this medicine, talk to your doctor, pharmacist, or health care provider.  2015, Elsevier/Gold Standard. (2007-12-15 10:23:57)  

## 2014-06-24 LAB — TYPE AND SCREEN
ABO/RH(D): O POS
ANTIBODY SCREEN: NEGATIVE
UNIT DIVISION: 0
Unit division: 0

## 2014-06-27 ENCOUNTER — Telehealth: Payer: Self-pay | Admitting: *Deleted

## 2014-06-27 ENCOUNTER — Encounter: Payer: Self-pay | Admitting: *Deleted

## 2014-06-27 LAB — TISSUE HYBRIDIZATION (BONE MARROW)-NCBH

## 2014-06-27 LAB — CHROMOSOME ANALYSIS, BONE MARROW

## 2014-06-27 NOTE — Telephone Encounter (Signed)
Lm on answering machine, note to dr Hazeline Junker desk re: bx results.

## 2014-06-28 ENCOUNTER — Telehealth: Payer: Self-pay | Admitting: *Deleted

## 2014-06-28 NOTE — Telephone Encounter (Signed)
Spoke with patient, per dr Alen Blew, he is waiting onfinal results of bx. So far everything looks good. Will contact patient when all results are in. Patient verbalized understanding.

## 2014-06-29 ENCOUNTER — Ambulatory Visit: Payer: PPO | Admitting: Physical Therapy

## 2014-06-29 DIAGNOSIS — R531 Weakness: Secondary | ICD-10-CM

## 2014-06-29 DIAGNOSIS — R269 Unspecified abnormalities of gait and mobility: Secondary | ICD-10-CM

## 2014-06-29 DIAGNOSIS — R279 Unspecified lack of coordination: Secondary | ICD-10-CM

## 2014-06-29 DIAGNOSIS — Z7409 Other reduced mobility: Secondary | ICD-10-CM

## 2014-06-29 NOTE — Therapy (Signed)
[name] [address] [place], [place], [address] Phone: [phone]   Fax:  [phone]  Physical Therapy Treatment  Patient Details  Name: [name] MRN: [id number] Date of Birth: [birth year] Referring Provider:  Lajean Manes, MD  Encounter Date: 06/29/2014      PT End of Session - 06/29/14 1918    Visit Number 4   Number of Visits 9   Date for PT Re-Evaluation 08/07/14   Authorization Type Medicare: G-Code required every 10th visit.   PT Start Time [id number]  Pt arrived late to session   PT Stop Time 1020   PT Time Calculation (min) 42 min   Equipment Utilized During Treatment Gait belt   Activity Tolerance Patient tolerated treatment well   Behavior During Therapy WFL for tasks assessed/performed      Past Medical History  Diagnosis Date  . Orthostatic lightheadedness   . History of subdural hematoma     LEFT CHRONIC SUBDURAL HEMATOMA   S/P PARIETAL CRANIOTOMY WITH EVACUATION HEMATOMA  . Atrial fibrillation, persistent   . H/O cardiac radiofrequency ablation     A-FLUTTER  . Cardiac pacemaker last check 08-30-2013    05-05-2007  DD  MEDTRONIC GUIDANT INSIGNIA  . RBBB   . Benign essential tremor   . Diverticulosis of colon   . Tachy-brady syndrome   . SSS (sick sinus syndrome)   . Hydronephrosis, bilateral   . Renal insufficiency   . Recurrent prostate carcinoma     2005  S/P RADIOACTIVE SEED IMPLANTS/   BLADDER NECK MASS RESECTED 05/ 2015  SHOWED RECURRENT PROSTATE CANCER    Past Surgical History  Procedure Laterality Date  . Tonsillectomy  as child  . Subdural hematoma evacuation via craniotomy  12-16-2007  . Rotator cuff repair w/ distal clavicle excision Left 08-31-2010  . Pacemaker insertion  05-05-2007      Guidant Insignia [employer])  . Total knee arthroplasty  12/18/2010    Procedure: TOTAL KNEE ARTHROPLASTY;  Surgeon: Mauri Pole;  Location: [facility];  Service: Orthopedics;   Laterality: Left;  . Cataract extraction    . Cystoscopy w/ ureteral stent placement Bilateral 05/30/2013    Procedure: CYSTOSCOPY WITH RETROGRADE PYELOGRAM/ BILATERAL URETERAL STENT PLACEMENT, TRANSURETRAL RESECTION OF BLADDER TUMOR WITH GYRUS;  Surgeon: Alexis Frock, MD;  Location: [facility];  Service: Urology;  Laterality: Bilateral;  . Cardioversion N/A 08/04/2013    Procedure: CARDIOVERSION;  Surgeon: Sanda Klein, MD;  [facility]: [facility];  Service: Cardiovascular;  Laterality: N/A;  . Knee arthroscopy Left 02-11-2007  . Cardioversion  multiple--  last one 08-04-2013    SINCE 2008  . Cardiac electrophysiology mapping and ablation  04-06-2007  dr Caryl Comes  . Cardiac catheterization  12-22-2006      non-obstructive CAD / RCA 20%/  ef 50%/  a-fib  . Radioactive prostate seed implants  12-21-2003  . Orif left distal clavical fx  08-31-2010  . Colonoscopy w/ polypectomy  12-01-2008  . Transthoracic echocardiogram  05-31-2013    MILD LVH/  EF 65%/  MODERATE LAE/  TRIVIAL MR  &  TR  . Cystoscopy/retrograde/ureteroscopy Right 09/16/2013    Procedure: CYSTOSCOPY/RETROGRADE/URETEROSCOPIC STENT EXTRACTION/  bil retrograde pyelogram with inseertion of bilateral stents;  Surgeon: Jorja Loa, MD;  [facility];  Service: Urology;  Laterality: Right;    There were no vitals filed for this visit.  Visit Diagnosis:  Lack of coordination  Impaired functional mobility and activity tolerance  Weakness  generalized  Abnormality of gait      Subjective Assessment - 06/29/14 0956    Subjective Pt reports sustaining fall this morning prior to PT. Pt uninjured; no pain at this time. Wife drove pt to session due to concern after fall. Fall occurred secondary to "feet got ahead of me and were shuffling," per pt. Pt not using walker due to pt-reported inability to safely fit walker into/out of bathroom. Pt does not recall previous fall (see PT session note from 06/15/14) and  does not remember if he notified referring MD at recent appt. Wife verified that pt did not notifie her or primary MD of recent fall (06/15/14) due to possible syncopal episode.   Patient is accompained by: Family member  wife, [name], present for final 5 minutes of today's session to discuss recent falls   Pertinent History Cancer, pacemaker, Afib, anemia, SDH s/p craniotomy in 2009   Patient Stated Goals "Balance is the critical concern we have. I'm also taking little short steps and would like to improve the strength in my legs and overall physical health."   Currently in Pain? No/denies      Treatment   Therapeutic Activities: - Explained, demonstrated sidestepping when entering/exiting bathroom to enable pt to use rolling walker in bathroom. Pt required verbal cueing initially but did demonstrate within-session carryover of technique. - Functional ambulation x120', x50', and x70' with RW and supervision. Reiterated safe, proper use of RW during sit<>stand transfers with inconsistent within-session carryover.  Neuro Re-ed: - Continued [place] home exercise program for increased stability with static/dynamic standing balance. See below for details. Cueing required for technique and to modify challenge by altering UE support. Supervision provided to ensure patient safety.    Self care: See Patient Education       Balance Exercises - 06/29/14 2115    [place] PROGRAM   Ankle Movements 10 reps   Backwards Walking Support   Sideways Walking Assistive device   Tandem Stance 10 seconds, support  20 seconds   Tandem Walk Support           PT Education - 06/29/14 1911    Education provided Yes   Education Details Recommended pt follow up with primary MD concerning recent increase in falls, increased memory impairment. Side stepping with RW in bathroom. Continued [place] HEP   Person(s) Educated Spouse;Patient   Methods Explanation;Handout   Comprehension Verbalized  understanding;Returned demonstration          PT Short Term Goals - 06/29/14 2117    PT SHORT TERM GOAL #1   Title Pt will demonstrate understanding of HEP with mod I using paper handout to facilitate safe carryover of home exercises. Target date: 07/06/14.   Status On-going   PT SHORT TERM GOAL #2   Title Pt will verbalize understanding of fall prevention strategies to decrease risk of falling within home. Target date: 07/06/14.   Status On-going   PT SHORT TERM GOAL #3   Title Pt will increase Berg Balance Scale score by 3 points from initial assessment to demonstrate improved functional standing balance. Target date: 07/06/14.   Baseline 39/56   Status On-going           PT Long Term Goals - 06/22/14 1243    PT LONG TERM GOAL #1   Title Pt will perform TUG in 13.5 seconds or less to demonstrate decreased risk of falling. Target date: 08/07/14.   Status On-going   PT LONG TERM GOAL #2   Title Pt  will exhibit gait speed of 2.86 ft/sec to demonstrate significantly improved stability with functional mobility. Target date: 08/07/14.   Status On-going   PT LONG TERM GOAL #3   Title Pt willl demonstrate FOTO measure of 52 to demonstrate significant change in patient-perceived functional status. Target date: 08/07/14.   Status On-going   PT LONG TERM GOAL #4   Title Pt will ambulate 500' over level and unlevel surfaces with LRAD and mod I to demonstrate increased safety with community ambulation. Target date: 08/07/14.   PT LONG TERM GOAL #5   Title Pt will negotiate standard curb step with LRAD and mod I to meet pt-stated goal and exhibit safety with community mobility. Target date: 08/07/14.   Status On-going   PT LONG TERM GOAL #6   Title Pt will increase Berg Balance Scale score by 6 points from initial assessment to demonstrate improved functional standing balance. Target date: 08/07/14.  Baseline: 39/56   Status On-going               Plan - 06/29/14 1921    Clinical  Impression Statement Addressed concern over frequent falls with pt/wife. Pt demonstrates increasingly less carryover of information discussed during PT sessions and does not recall fall sustained 06/15/14 due to pt report of having "collapsed" in bathroom. After speaking with pt and wife (with pt permission) about concern with said falls, pt/wife to follow up with primary MD. Continue per POC.   Pt will benefit from skilled therapeutic intervention in order to improve on the following deficits Abnormal gait;Decreased balance;Postural dysfunction;Decreased strength;Impaired perceived functional ability;Decreased activity tolerance;Decreased coordination;Decreased knowledge of use of DME;Decreased mobility;Decreased range of motion;Impaired flexibility;Increased edema;Cardiopulmonary status limiting activity;Decreased endurance   Rehab Potential Good   Clinical Impairments Affecting Rehab Potential Pt reports impaired short term memory.   PT Frequency 1x / week  Recommended 2x/week for 8 weeks based on clinical judgement; however, pt politely declined due to financial limitations   PT Duration 8 weeks   PT Treatment/Interventions Gait training;DME Instruction;Stair training;Functional mobility training;Therapeutic activities;Therapeutic exercise;Balance training;Neuromuscular re-education;Patient/family education;ADLs/Self Care Home Management;Moist Heat;Orthotic Fit/Training;Manual techniques;Energy conservation;Vestibular;Passive range of motion   PT Next Visit Plan Check STG's. Ask about falls, conversation with MD. Continue [place] HEP. Reinforce sidestepping into bathroom with rollator due to recent falls in bathroom without AD. Address negotiation of stairs and standard curb step.   PT Home Exercise Plan [place] HEP   Consulted and Agree with Plan of Care Patient;Family member/caregiver   Family Member Consulted Wife, [name]        Problem List Patient Active Problem List   Diagnosis Date Noted   . Anemia in chronic kidney disease (CKD)   . UTI (lower urinary tract infection) 07/10/2013  . Sepsis 07/10/2013  . Prostate cancer 06/02/2013  . Unspecified constipation 06/02/2013  . Bladder mass 06/01/2013  . ARF (acute renal failure) 05/29/2013  . Hyperkalemia 05/29/2013  . Acute renal failure 05/28/2013  . Anemia 05/28/2013  . Atrial tachycardia 10/23/2011  . S/P left total knee replacement 12/19/2010  . Pacemaker-dual-chamber-[employer] 06/07/2010  . Orthostatic lightheadedness 06/07/2010  . Mitral regurgitation 06/07/2010  . AV BLOCK, COMPLETE 04/25/2009  . ATRIAL FIBRILLATION-paroxysmal 05/12/2008  . SICK SINUS/ TACHY-BRADY SYNDROME 05/12/2008    Billie Ruddy, PT, DPT [facility] [address] [place], [place], [address] Phone: [phone]   Fax:  [phone] 06/29/2014, 9:24 PM

## 2014-07-01 ENCOUNTER — Encounter (HOSPITAL_COMMUNITY): Payer: Self-pay

## 2014-07-04 ENCOUNTER — Telehealth: Payer: Self-pay | Admitting: Internal Medicine

## 2014-07-04 NOTE — Telephone Encounter (Signed)
error 

## 2014-07-06 ENCOUNTER — Ambulatory Visit: Payer: PPO | Admitting: Physical Therapy

## 2014-07-06 ENCOUNTER — Encounter: Payer: Self-pay | Admitting: Physical Therapy

## 2014-07-06 DIAGNOSIS — R296 Repeated falls: Secondary | ICD-10-CM

## 2014-07-06 DIAGNOSIS — R269 Unspecified abnormalities of gait and mobility: Secondary | ICD-10-CM | POA: Diagnosis not present

## 2014-07-06 NOTE — Therapy (Signed)
[facility] [address] [place], [place], [address] Phone: [phone]   Fax:  [phone]  Physical Therapy Treatment  Patient Details  Name: [name] MRN: [id number] Date of Birth: 07/12/1931 Referring Provider:  Lajean Manes, MD  Encounter Date: 07/06/2014      PT End of Session - 07/06/14 1347    Visit Number 5   Number of Visits 9   Date for PT Re-Evaluation 08/07/14   Authorization Type Medicare: G-Code required every 10th visit.   PT Start Time 1042  Pt arrived late to session   PT Stop Time 1105   PT Time Calculation (min) 23 min   Activity Tolerance Patient tolerated treatment well   Behavior During Therapy [facility] for tasks assessed/performed      Past Medical History  Diagnosis Date  . Orthostatic lightheadedness   . History of subdural hematoma     LEFT CHRONIC SUBDURAL HEMATOMA   S/P PARIETAL CRANIOTOMY WITH EVACUATION HEMATOMA  . Atrial fibrillation, persistent   . H/O cardiac radiofrequency ablation     A-FLUTTER  . Cardiac pacemaker last check 08-30-2013    05-05-2007  DD  [employer] GUIDANT INSIGNIA  . RBBB   . Benign essential tremor   . Diverticulosis of colon   . Tachy-brady syndrome   . SSS (sick sinus syndrome)   . Hydronephrosis, bilateral   . Renal insufficiency   . Recurrent prostate carcinoma     2005  S/P RADIOACTIVE SEED IMPLANTS/   BLADDER NECK MASS RESECTED 05/ 2015  SHOWED RECURRENT PROSTATE CANCER    Past Surgical History  Procedure Laterality Date  . Tonsillectomy  as child  . Subdural hematoma evacuation via craniotomy  12-16-2007  . Rotator cuff repair w/ distal clavicle excision Left 08-31-2010  . Pacemaker insertion  05-05-2007      Guidant Insignia [employer])  . Total knee arthroplasty  12/18/2010    Procedure: TOTAL KNEE ARTHROPLASTY;  Surgeon: Mauri Pole;  Location: [facility];  Service: Orthopedics;  Laterality: Left;  . Cataract extraction    .  Cystoscopy w/ ureteral stent placement Bilateral 05/30/2013    Procedure: CYSTOSCOPY WITH RETROGRADE PYELOGRAM/ BILATERAL URETERAL STENT PLACEMENT, TRANSURETRAL RESECTION OF BLADDER TUMOR WITH GYRUS;  Surgeon: Alexis Frock, MD;  Location: [facility];  Service: Urology;  Laterality: Bilateral;  . Cardioversion N/A 08/04/2013    Procedure: CARDIOVERSION;  Surgeon: Sanda Klein, MD;  [facility]: [facility];  Service: Cardiovascular;  Laterality: N/A;  . Knee arthroscopy Left 02-11-2007  . Cardioversion  multiple--  last one 08-04-2013    SINCE 2008  . Cardiac electrophysiology mapping and ablation  04-06-2007  dr Caryl Comes  . Cardiac catheterization  12-22-2006      non-obstructive CAD / RCA 20%/  ef 50%/  a-fib  . Radioactive prostate seed implants  12-21-2003  . Orif left distal clavical fx  08-31-2010  . Colonoscopy w/ polypectomy  12-01-2008  . Transthoracic echocardiogram  05-31-2013    MILD LVH/  EF 65%/  MODERATE LAE/  TRIVIAL MR  &  TR  . Cystoscopy/retrograde/ureteroscopy Right 09/16/2013    Procedure: CYSTOSCOPY/RETROGRADE/URETEROSCOPIC STENT EXTRACTION/  bil retrograde pyelogram with inseertion of bilateral stents;  Surgeon: Jorja Loa, MD;  Location: [facility];  Service: Urology;  Laterality: Right;    There were no vitals filed for this visit.  Visit Diagnosis:  Falls frequently      Subjective Assessment - 07/06/14 1053    Subjective Pt reports having sustained  2-3 falls since last PT session. Pt unable to remember if it was 2 of 3 falls. While at [facility] home improvement, pt fell backwards because "my feet were moving fast and I just couldn't stop," per pt. Pt did strike head on concrete but no LOC that pt is able to remember. Pt does have abrasions on R forearm from said fall. Wife, [name] (present for final 15 minutes of this session) explained that pt also sustained a fall last Friday in home bathroom. Per wife, pt unable to get off floor even with wife  attempting to assist. Therefore, wife called EMS to assist pt off floor and assess pt for injury. Pt was determined by EMS to be unharmed.    Pertinent History Cancer, pacemaker, Afib, anemia, SDH s/p craniotomy in 2009   Patient Stated Goals "Balance is the critical concern we have. I'm also taking little short steps and would like to improve the strength in my legs and overall physical health."   Currently in Pain? No/denies                                 PT Education - 07/06/14 1345    Education provided Yes   Education Details Discussed that pt may need further medical testing/intervention, as frequency of falls is not improving with physical therapy. Discussed plan for D/C. Recommended pt follow up with referring MD.   Terence Lux) Educated Patient;Spouse   Methods Explanation   Comprehension Verbalized understanding          PT Short Term Goals - 06/29/14 2117    PT SHORT TERM GOAL #1   Title Pt will demonstrate understanding of HEP with mod I using paper handout to facilitate safe carryover of home exercises. Target date: 07/06/14.   Status On-going   PT SHORT TERM GOAL #2   Title Pt will verbalize understanding of fall prevention strategies to decrease risk of falling within home. Target date: 07/06/14.   Status On-going   PT SHORT TERM GOAL #3   Title Pt will increase Berg Balance Scale score by 3 points from initial assessment to demonstrate improved functional standing balance. Target date: 07/06/14.   Baseline 39/56   Status On-going           PT Long Term Goals - 06/22/14 1243    PT LONG TERM GOAL #1   Title Pt will perform TUG in 13.5 seconds or less to demonstrate decreased risk of falling. Target date: 08/07/14.   Status On-going   PT LONG TERM GOAL #2   Title Pt will exhibit gait speed of 2.86 ft/sec to demonstrate significantly improved stability with functional mobility. Target date: 08/07/14.   Status On-going   PT LONG TERM GOAL #3    Title Pt willl demonstrate FOTO measure of 52 to demonstrate significant change in patient-perceived functional status. Target date: 08/07/14.   Status On-going   PT LONG TERM GOAL #4   Title Pt will ambulate 500' over level and unlevel surfaces with LRAD and mod I to demonstrate increased safety with community ambulation. Target date: 08/07/14.   PT LONG TERM GOAL #5   Title Pt will negotiate standard curb step with LRAD and mod I to meet pt-stated goal and exhibit safety with community mobility. Target date: 08/07/14.   Status On-going   PT LONG TERM GOAL #6   Title Pt will increase Berg Balance Scale score by 6 points from initial assessment  to demonstrate improved functional standing balance. Target date: 08/07/14.  Baseline: 39/56   Status On-going               Plan - 07/06/14 1351    Clinical Impression Statement Due to increasingly more frequent falls, discussed with pt/wife that safety/stability with functional mobility does not appear to be improving with physical therapy. Due to ongoing cancer treatment, anemia, and cardiac history, discussed that pt may require further medical testing/intervention to address frequent falls. Pt will therefore be discharged from outpatient PT at this time. Pt/wife verbalized understanding and were in full agreement with discharge plan.   Pt will benefit from skilled therapeutic intervention in order to improve on the following deficits Abnormal gait;Decreased balance;Postural dysfunction;Decreased strength;Impaired perceived functional ability;Decreased activity tolerance;Decreased coordination;Decreased knowledge of use of DME;Decreased mobility;Decreased range of motion;Impaired flexibility;Increased edema;Cardiopulmonary status limiting activity;Decreased endurance   Recommended Other Services Recommended to pt and wife to follow up with referring MD (PCP) regarding D/C from physical therapy, increasingly more frequent falls.   Consulted and Agree  with Plan of Care Patient;Family member/caregiver   Family Member Consulted Wife, [name]        Problem List Patient Active Problem List   Diagnosis Date Noted  . Anemia in chronic kidney disease (CKD)   . UTI (lower urinary tract infection) 07/10/2013  . Sepsis 07/10/2013  . Prostate cancer 06/02/2013  . Unspecified constipation 06/02/2013  . Bladder mass 06/01/2013  . ARF (acute renal failure) 05/29/2013  . Hyperkalemia 05/29/2013  . Acute renal failure 05/28/2013  . Anemia 05/28/2013  . Atrial tachycardia 10/23/2011  . S/P left total knee replacement 12/19/2010  . Pacemaker-dual-chamber-[employer] 06/07/2010  . Orthostatic lightheadedness 06/07/2010  . Mitral regurgitation 06/07/2010  . AV BLOCK, COMPLETE 04/25/2009  . ATRIAL FIBRILLATION-paroxysmal 05/12/2008  . SICK SINUS/ TACHY-BRADY SYNDROME 05/12/2008   Billie Ruddy, PT, DPT [facility] [address] [place], [place], [address] Phone: [phone]   Fax:  [phone] 07/06/2014, 1:56 PM

## 2014-07-06 NOTE — Therapy (Signed)
[facility] [address] [place], [place], [address] Phone: [phone]   Fax:  [phone]  Patient Details  Name: [name] MRN: [id number] Date of Birth: 1931/09/12 Referring Provider:  Lajean Manes, MD  Encounter Date: 07/06/2014 PHYSICAL THERAPY DISCHARGE SUMMARY  Visits from Start of Care: 5  Current functional level related to goals / functional outcomes:  Unknown; long term goals not assessed due to ongoing falls despite physical therapy treatment.      PT Long Term Goals - 06/22/14 1243    PT LONG TERM GOAL #1   Title Pt will perform TUG in 13.5 seconds or less to demonstrate decreased risk of falling. Target date: 08/07/14.   Status On-going   PT LONG TERM GOAL #2   Title Pt will exhibit gait speed of 2.86 ft/sec to demonstrate significantly improved stability with functional mobility. Target date: 08/07/14.   Status On-going   PT LONG TERM GOAL #3   Title Pt willl demonstrate FOTO measure of 52 to demonstrate significant change in patient-perceived functional status. Target date: 08/07/14.   Status On-going   PT LONG TERM GOAL #4   Title Pt will ambulate 500' over level and unlevel surfaces with LRAD and mod I to demonstrate increased safety with community ambulation. Target date: 08/07/14.   PT LONG TERM GOAL #5   Title Pt will negotiate standard curb step with LRAD and mod I to meet pt-stated goal and exhibit safety with community mobility. Target date: 08/07/14.   Status On-going   PT LONG TERM GOAL #6   Title Pt will increase Berg Balance Scale score by 6 points from initial assessment to demonstrate improved functional standing balance. Target date: 08/07/14.  Baseline: 39/56   Status On-going        Remaining deficits: Pt continues to report frequent falls and demonstrates decreased postural/gait stability, abnormal posture/gait, and decreased activity tolerance. Unable to formally assess all  long term goals secondary to discharge from PT prior to completing POC.    Education / Equipment: Due to increasingly more frequent falls, discussed with pt/wife that safety/stability with functional mobility does not appear to be improving with physical therapy. Due to ongoing cancer treatment, anemia, and cardiac history, discussed that pt may require further medical testing/intervention to address frequent falls. Pt will therefore be discharged from outpatient PT at this time. Pt/wife verbalized understanding and were in full agreement with discharge plan.   Plan: Patient agrees to discharge.  Patient goals were not met. Patient is being discharged due to lack of progress.        Frequent falls.        Alfonse Flavors, PT, DPT [facility] [address] [place], [place], [address] Phone: [phone]   Fax:  [phone] 07/06/2014, 4:00 PM

## 2014-07-07 ENCOUNTER — Other Ambulatory Visit (HOSPITAL_BASED_OUTPATIENT_CLINIC_OR_DEPARTMENT_OTHER): Payer: PPO

## 2014-07-07 ENCOUNTER — Ambulatory Visit (HOSPITAL_BASED_OUTPATIENT_CLINIC_OR_DEPARTMENT_OTHER): Payer: PPO

## 2014-07-07 ENCOUNTER — Telehealth: Payer: Self-pay | Admitting: Oncology

## 2014-07-07 ENCOUNTER — Ambulatory Visit (HOSPITAL_BASED_OUTPATIENT_CLINIC_OR_DEPARTMENT_OTHER): Payer: PPO | Admitting: Oncology

## 2014-07-07 ENCOUNTER — Ambulatory Visit: Payer: PPO

## 2014-07-07 VITALS — BP 130/6 | HR 62 | Temp 97.6°F | Resp 18 | Ht 72.0 in | Wt 180.5 lb

## 2014-07-07 DIAGNOSIS — C61 Malignant neoplasm of prostate: Secondary | ICD-10-CM

## 2014-07-07 DIAGNOSIS — I4891 Unspecified atrial fibrillation: Secondary | ICD-10-CM

## 2014-07-07 DIAGNOSIS — Z7901 Long term (current) use of anticoagulants: Secondary | ICD-10-CM

## 2014-07-07 DIAGNOSIS — Z8546 Personal history of malignant neoplasm of prostate: Secondary | ICD-10-CM

## 2014-07-07 DIAGNOSIS — D500 Iron deficiency anemia secondary to blood loss (chronic): Secondary | ICD-10-CM

## 2014-07-07 DIAGNOSIS — N189 Chronic kidney disease, unspecified: Principal | ICD-10-CM

## 2014-07-07 DIAGNOSIS — D509 Iron deficiency anemia, unspecified: Secondary | ICD-10-CM

## 2014-07-07 DIAGNOSIS — D631 Anemia in chronic kidney disease: Secondary | ICD-10-CM

## 2014-07-07 DIAGNOSIS — N179 Acute kidney failure, unspecified: Secondary | ICD-10-CM

## 2014-07-07 LAB — CBC WITH DIFFERENTIAL/PLATELET
BASO%: 0.2 % (ref 0.0–2.0)
Basophils Absolute: 0 10*3/uL (ref 0.0–0.1)
EOS%: 2.7 % (ref 0.0–7.0)
Eosinophils Absolute: 0.2 10*3/uL (ref 0.0–0.5)
HCT: 33 % — ABNORMAL LOW (ref 38.4–49.9)
HGB: 10.3 g/dL — ABNORMAL LOW (ref 13.0–17.1)
LYMPH%: 22.5 % (ref 14.0–49.0)
MCH: 24.8 pg — AB (ref 27.2–33.4)
MCHC: 31.2 g/dL — AB (ref 32.0–36.0)
MCV: 79.5 fL (ref 79.3–98.0)
MONO#: 0.6 10*3/uL (ref 0.1–0.9)
MONO%: 9.4 % (ref 0.0–14.0)
NEUT#: 3.8 10*3/uL (ref 1.5–6.5)
NEUT%: 65.2 % (ref 39.0–75.0)
PLATELETS: 195 10*3/uL (ref 140–400)
RBC: 4.15 10*6/uL — ABNORMAL LOW (ref 4.20–5.82)
RDW: 17.9 % — ABNORMAL HIGH (ref 11.0–14.6)
WBC: 5.8 10*3/uL (ref 4.0–10.3)
lymph#: 1.3 10*3/uL (ref 0.9–3.3)

## 2014-07-07 LAB — HOLD TUBE, BLOOD BANK

## 2014-07-07 MED ORDER — SODIUM CHLORIDE 0.9 % IV SOLN
Freq: Once | INTRAVENOUS | Status: AC
  Administered 2014-07-07: 10:00:00 via INTRAVENOUS

## 2014-07-07 MED ORDER — SODIUM CHLORIDE 0.9 % IV SOLN
510.0000 mg | Freq: Once | INTRAVENOUS | Status: AC
  Administered 2014-07-07: 510 mg via INTRAVENOUS
  Filled 2014-07-07: qty 17

## 2014-07-07 NOTE — Patient Instructions (Signed)

## 2014-07-07 NOTE — Telephone Encounter (Signed)
Gave and printed appt sched and avs for pt June and Aug

## 2014-07-07 NOTE — Progress Notes (Signed)
Hematology and Oncology Follow Up Visit  [name] [id number] 06-12-1931 81 y.o. 07/07/2014 9:39 AM [name], MDStoneking, Hal, MD   Principle Diagnosis: 81 year old gentleman with multifactorial anemia he has an element of anemia of chronic disease, renal insufficiency and iron deficiency. He could have an element of myelodysplasia. He has also history of prostate cancer.  Prior Therapy:  His prostate cancer dates back to 2005 where he had a Gleason score 3+3 = 6. His PSA was 4.4. He was treated with brachytherapy and seed implants and 12/21/2003. His PSA did rise up to 2.89 and found to have local recurrence of his prostate cancer into the bladder neck. He underwent a double J stent placement and his staging workup did not show any widespread metastasis. He was treated with androgen deprivation with Mills Koller and his PSA dropped down to 0.93. He is currently receiving Lupron every 4 months under the care of Dr. Diona Fanti.  He is status post bone marrow biopsy done on 06/16/2014 that showed no evidence of malignancy and scarce iron stores.   Current therapy: Aranesp 300 g every 3 weeks to keep his hemoglobin above 11. He also will receive IV iron as well as packed red cell transfusion as needed.  Interim History:  [name] presents today for a follow-up visit with his daughter. Since the last visit, he reports episodic falls without syncope. He had a recent 1 while he was shopping and using a walker but did not sustain any injury. He tolerated the bone marrow biopsy without any complications. He still report some occasional dizziness and lightheadedness. He does not report any active bleeding at this time. He does not report any hematochezia or melena. He is no longer reporting hematuria. He does not report any arthralgias or myalgias. He does report fatigue and tiredness and exertional dyspnea. But still able to perform activities of daily living. He continues to live  independently with his wife.    He does not report any fevers, chills, sweats does report weight loss and decline in his appetite. He does not report any chest pain, palpitation, orthopnea or PND. He does not report any leg edema, cough, shortness of breath, difficulty breathing but does report occasional dyspnea on exertion. He does not report any nausea, vomiting, constipation or diarrhea. He does not report any frequency urgency or hesitancy. He does report incontinence. He does not report any mood issues such as anxiety or depression. Rest of his review of systems unremarkable.   Medications: I have reviewed the patient's current medications.  Current Outpatient Prescriptions  Medication Sig Dispense Refill  . calcium citrate-vitamin D (CITRACAL+D) 315-200 MG-UNIT per tablet Take 1 tablet by mouth 2 (two) times daily.    . Glucosamine-Chondroitin 1500-1200 MG/30ML LIQD Take 30 mLs by mouth daily.    . Inulin (FIBER CHOICE PO) Take 2 capsules by mouth daily.     Marland Kitchen levothyroxine (SYNTHROID, LEVOTHROID) 137 MCG tablet Take 137 mcg by mouth daily before breakfast.    . megestrol (MEGACE) 20 MG tablet Take 20 mg by mouth 2 (two) times daily.    . Melatonin 3 MG TABS Take 2 tablets by mouth at bedtime. Takes again @ 4 am as needed.    . Methylfol-Methylcob-Acetylcyst (CEREFOLIN NAC) 6-2-600 MG TABS Take one tablet by mouth one time daily 90 each 3  . Multiple Vitamin (MULTIVITAMIN WITH MINERALS) TABS tablet Take 1 tablet by mouth daily.    . nitrofurantoin, macrocrystal-monohydrate, (MACROBID) 100 MG capsule Take 100 mg by  mouth 2 (two) times daily.  0  . Omega-3 Krill Oil 300 MG CAPS Take 300 mg by mouth daily.    . propafenone (RYTHMOL) 225 MG tablet TAKE 1 TABLET (225 MG TOTAL) BY MOUTH 2 (TWO) TIMES DAILY. 60 tablet 6  . propranolol ER (INDERAL LA) 60 MG 24 hr capsule Take 60 mg by mouth daily at 2 am.    . rivaroxaban (XARELTO) 15 MG TABS tablet Take 1 tablet (15 mg total) by mouth daily with  supper. (Patient taking differently: Take 15 mg by mouth at bedtime. At 10pm) 10 tablet 0  . solifenacin (VESICARE) 5 MG tablet Take 5 mg by mouth daily.    . tamsulosin (FLOMAX) 0.4 MG CAPS capsule Take 0.4 mg by mouth daily.    Marland Kitchen zolpidem (AMBIEN) 10 MG tablet Take 10 mg by mouth at bedtime as needed for sleep.   1   No current facility-administered medications for this visit.     Allergies: No Known Allergies  Past Medical History, Surgical history, Social history, and Family History were reviewed and updated.  Physical Exam: Blood pressure 130/6, pulse 62, temperature 97.6 F (36.4 C), temperature source Oral, resp. rate 18, height 6' (1.829 m), weight 180 lb 8 oz (81.874 kg). ECOG: 1 General appearance: alert and cooperative. Not in any distress. Head: Normocephalic, without obvious abnormality Neck: no adenopathy Lymph nodes: Cervical, supraclavicular, and axillary nodes normal. Heart:regular rate and rhythm, S1, S2 normal, no murmur, click, rub or gallop Lung:chest clear, no wheezing, rales, normal symmetric air entry Abdomin: soft, non-tender, without masses or organomegaly EXT:no erythema, induration, or nodules   Lab Results: Lab Results  Component Value Date   WBC 5.8 07/07/2014   HGB 10.3* 07/07/2014   HCT 33.0* 07/07/2014   MCV 79.5 07/07/2014   PLT 195 07/07/2014     Chemistry      Component Value Date/Time   NA 137 01/27/2014 1426   NA 141 09/16/2013 0852   K 4.2 01/27/2014 1426   K 4.4 09/16/2013 0852   CL 105 09/16/2013 0852   CO2 21* 01/27/2014 1426   CO2 28 07/30/2013 1147   BUN 35.5* 01/27/2014 1426   BUN 30* 09/16/2013 0852   CREATININE 1.7* 01/27/2014 1426   CREATININE 1.20 09/16/2013 0852      Component Value Date/Time   CALCIUM 9.1 01/27/2014 1426   CALCIUM 9.4 07/30/2013 1147   ALKPHOS 71 01/27/2014 1426   ALKPHOS 61 07/12/2013 0355   AST 15 01/27/2014 1426   AST 23 07/12/2013 0355   ALT 8 01/27/2014 1426   ALT 18 07/12/2013 0355    BILITOT 0.38 01/27/2014 1426   BILITOT 0.3 07/12/2013 0355     FINAL DIAGNOSIS Diagnosis Bone Marrow, Aspirate,Biopsy, and Clot, right iliac 06/16/2014.  - NORMOCELLULAR MARROW WITH TRILINEAGE HEMATOPOIESIS AND MATURATION. - MINIMAL IRON STORES. - NO METASTATIC MALIGNANCY. - SEE COMMENT. PERIPHERAL BLOOD  Impression and Plan:   81 year old gentleman with the following issues:  1. Multifactorial anemia. His anemia is normocytic and have been fluctuating at least for the last 6 months. His erythropoietin is inappropriately elevated at this time. Bone marrow biopsy results discussed with the patient and his daughter today. It did not show any evidence of dysplasia or infiltrative process. It did show minimal iron stores. Repeat iron studies in May 2016 continued to show low iron levels.  His hemoglobin today appears adequate and does not require packed results transfusion. I plan on giving him another iron infusion for a  total of 1 g split over 2 weeks and reassess him in 6 weeks. We will hold off on any growth factor support to his iron stores are completely replete.   2. Prostate cancer: He did develop locally advanced hormone sensitive prostate cancer and currently on Lupron. His PSA and a reasonable control under the care of Dr. Diona Fanti. At this time no further intervention is recommended.   3. Atrial fibrillation: He is currently in normal sinus rhythm but chronically anticoagulated on Xarelto which could be contributing to microscopic hematuria chronic blood loss.   4. Renal insufficiency: His creatinine clearance close to 50 mL/m. Last creatinine was 1.7.    [facility], MD 6/23/20169:39 AM

## 2014-07-14 ENCOUNTER — Ambulatory Visit (HOSPITAL_BASED_OUTPATIENT_CLINIC_OR_DEPARTMENT_OTHER): Payer: PPO

## 2014-07-14 VITALS — BP 120/65 | HR 59 | Temp 97.8°F | Resp 16

## 2014-07-14 DIAGNOSIS — D509 Iron deficiency anemia, unspecified: Secondary | ICD-10-CM

## 2014-07-14 DIAGNOSIS — D631 Anemia in chronic kidney disease: Secondary | ICD-10-CM

## 2014-07-14 DIAGNOSIS — N179 Acute kidney failure, unspecified: Secondary | ICD-10-CM

## 2014-07-14 DIAGNOSIS — N189 Chronic kidney disease, unspecified: Principal | ICD-10-CM

## 2014-07-14 DIAGNOSIS — D500 Iron deficiency anemia secondary to blood loss (chronic): Secondary | ICD-10-CM

## 2014-07-14 MED ORDER — SODIUM CHLORIDE 0.9 % IV SOLN
Freq: Once | INTRAVENOUS | Status: AC
  Administered 2014-07-14: 11:00:00 via INTRAVENOUS

## 2014-07-14 MED ORDER — SODIUM CHLORIDE 0.9 % IV SOLN
510.0000 mg | Freq: Once | INTRAVENOUS | Status: AC
  Administered 2014-07-14: 510 mg via INTRAVENOUS
  Filled 2014-07-14: qty 17

## 2014-07-14 NOTE — Patient Instructions (Signed)

## 2014-07-19 ENCOUNTER — Telehealth: Payer: Self-pay | Admitting: *Deleted

## 2014-07-19 NOTE — Telephone Encounter (Signed)
Received VM message from pt's wife @ 10:52 am. TC back to wife on all available #'s. No answer. Was able to leave VM on wife's cell phone. Wife had stated in message that patient has not been able to stand or walk since his fereheme infusion last Thursday. She did not give any more information. Tried to call back on 3 listed phone #'s.

## 2014-07-19 NOTE — Telephone Encounter (Signed)
Spoke with patient's wife, she states, dr Felipa Eth will refer patient to a neurologist.  Dr Cipriano Bunker in agreement. Note to dr Hazeline Junker desk.

## 2014-07-21 ENCOUNTER — Emergency Department (HOSPITAL_COMMUNITY): Payer: PPO

## 2014-07-21 ENCOUNTER — Emergency Department (HOSPITAL_COMMUNITY)
Admission: EM | Admit: 2014-07-21 | Discharge: 2014-07-22 | Disposition: A | Discharge status: Home / Self Care | Payer: PPO | Attending: Emergency Medicine | Admitting: Emergency Medicine

## 2014-07-21 ENCOUNTER — Ambulatory Visit (INDEPENDENT_AMBULATORY_CARE_PROVIDER_SITE_OTHER): Payer: PPO | Admitting: Neurology

## 2014-07-21 ENCOUNTER — Encounter (HOSPITAL_COMMUNITY): Payer: Self-pay

## 2014-07-21 ENCOUNTER — Telehealth: Payer: Self-pay | Admitting: Diagnostic Neuroimaging

## 2014-07-21 ENCOUNTER — Encounter: Payer: Self-pay | Admitting: Neurology

## 2014-07-21 VITALS — BP 99/63 | HR 60 | Ht 72.0 in

## 2014-07-21 DIAGNOSIS — Z9889 Other specified postprocedural states: Secondary | ICD-10-CM | POA: Insufficient documentation

## 2014-07-21 DIAGNOSIS — W19XXXA Unspecified fall, initial encounter: Secondary | ICD-10-CM

## 2014-07-21 DIAGNOSIS — I481 Persistent atrial fibrillation: Secondary | ICD-10-CM | POA: Diagnosis not present

## 2014-07-21 DIAGNOSIS — Z8546 Personal history of malignant neoplasm of prostate: Secondary | ICD-10-CM | POA: Diagnosis not present

## 2014-07-21 DIAGNOSIS — Z793 Long term (current) use of hormonal contraceptives: Secondary | ICD-10-CM | POA: Diagnosis not present

## 2014-07-21 DIAGNOSIS — R5383 Other fatigue: Secondary | ICD-10-CM | POA: Diagnosis not present

## 2014-07-21 DIAGNOSIS — R531 Weakness: Secondary | ICD-10-CM | POA: Diagnosis present

## 2014-07-21 DIAGNOSIS — Z79899 Other long term (current) drug therapy: Secondary | ICD-10-CM | POA: Diagnosis not present

## 2014-07-21 DIAGNOSIS — R29898 Other symptoms and signs involving the musculoskeletal system: Secondary | ICD-10-CM

## 2014-07-21 DIAGNOSIS — Z950 Presence of cardiac pacemaker: Secondary | ICD-10-CM | POA: Diagnosis not present

## 2014-07-21 DIAGNOSIS — N390 Urinary tract infection, site not specified: Secondary | ICD-10-CM | POA: Diagnosis not present

## 2014-07-21 DIAGNOSIS — Z8719 Personal history of other diseases of the digestive system: Secondary | ICD-10-CM | POA: Insufficient documentation

## 2014-07-21 DIAGNOSIS — Z7901 Long term (current) use of anticoagulants: Secondary | ICD-10-CM | POA: Insufficient documentation

## 2014-07-21 DIAGNOSIS — Z87448 Personal history of other diseases of urinary system: Secondary | ICD-10-CM | POA: Diagnosis not present

## 2014-07-21 DIAGNOSIS — M6281 Muscle weakness (generalized): Secondary | ICD-10-CM | POA: Insufficient documentation

## 2014-07-21 DIAGNOSIS — Z8669 Personal history of other diseases of the nervous system and sense organs: Secondary | ICD-10-CM | POA: Diagnosis not present

## 2014-07-21 LAB — CBC
HCT: 30.9 % — ABNORMAL LOW (ref 39.0–52.0)
Hemoglobin: 9.8 g/dL — ABNORMAL LOW (ref 13.0–17.0)
MCH: 25.5 pg — ABNORMAL LOW (ref 26.0–34.0)
MCHC: 31.7 g/dL (ref 30.0–36.0)
MCV: 80.5 fL (ref 78.0–100.0)
PLATELETS: 207 10*3/uL (ref 150–400)
RBC: 3.84 MIL/uL — AB (ref 4.22–5.81)
RDW: 21.5 % — ABNORMAL HIGH (ref 11.5–15.5)
WBC: 7.8 10*3/uL (ref 4.0–10.5)

## 2014-07-21 LAB — PROTIME-INR
INR: 2.62 — AB (ref 0.00–1.49)
Prothrombin Time: 27.6 seconds — ABNORMAL HIGH (ref 11.6–15.2)

## 2014-07-21 LAB — I-STAT CHEM 8, ED
BUN: 32 mg/dL — ABNORMAL HIGH (ref 6–20)
CHLORIDE: 104 mmol/L (ref 101–111)
Calcium, Ion: 1.21 mmol/L (ref 1.13–1.30)
Creatinine, Ser: 1.8 mg/dL — ABNORMAL HIGH (ref 0.61–1.24)
Glucose, Bld: 154 mg/dL — ABNORMAL HIGH (ref 65–99)
HCT: 29 % — ABNORMAL LOW (ref 39.0–52.0)
Hemoglobin: 9.9 g/dL — ABNORMAL LOW (ref 13.0–17.0)
POTASSIUM: 3.8 mmol/L (ref 3.5–5.1)
SODIUM: 137 mmol/L (ref 135–145)
TCO2: 18 mmol/L (ref 0–100)

## 2014-07-21 LAB — BASIC METABOLIC PANEL
ANION GAP: 10 (ref 5–15)
BUN: 33 mg/dL — AB (ref 6–20)
CHLORIDE: 104 mmol/L (ref 101–111)
CO2: 19 mmol/L — ABNORMAL LOW (ref 22–32)
Calcium: 8.9 mg/dL (ref 8.9–10.3)
Creatinine, Ser: 1.84 mg/dL — ABNORMAL HIGH (ref 0.61–1.24)
GFR, EST AFRICAN AMERICAN: 38 mL/min — AB (ref >60)
GFR, EST NON AFRICAN AMERICAN: 33 mL/min — AB (ref >60)
Glucose, Bld: 152 mg/dL — ABNORMAL HIGH (ref 65–99)
Potassium: 3.8 mmol/L (ref 3.5–5.1)
Sodium: 133 mmol/L — ABNORMAL LOW (ref 135–145)

## 2014-07-21 NOTE — Telephone Encounter (Signed)
Daughter called. Apparently pt seen by Dr. Leonie Man today, who ordered CT head urgently for today, but it was denied/delayed by insurance. Patient is declining and not doing well. I advised daughter to take patient to ER for urgent CT head and evaluation.   Penni Bombard, MD 04/14/9620, 29:79 PM Certified in Neurology, Neurophysiology and Neuroimaging  [facility] [address], [place], [place] [address] [phone]

## 2014-07-21 NOTE — Progress Notes (Signed)
[facility] [address] [place]. [place] [address] [phone]       OFFICE CONSULT NOTE  Mr. AADEN BUCKMAN Date of Birth:  1931-03-10 Medical Record Number:  [id number]   Referring MD: Lajean Manes  Reason for Referral:   Falls and leg weakness  HPI: Mr Iovino is a 45 year Caucasian male who is accompanied today by his wife who provides most of the history as well as a letter written by his daughter regarding his neurological difficulties. Patient has had gradually progressive gait difficulties starting from 2013 when he was noted as shuffling and having multiple falls and he was using a walker. In summer of 2015 he could walk with a walker but during the winter he became completely dependent on it. In spring of 2016 there is rapid decline and he had 3 falls in the month of March as well as in June. Subsequently lost his ability to ambulate and stand even with a walker and on June 30 he had sudden onset of leg weakness on the right along with inability to even stand with support and required help to transfer from the bed to the chair as well as to the toilet. He had extreme fear of an anxiety of falling and he has not been walking since then. He spends most of the time in the wheelchair. The patient denies any headache and he neck pain, back pain, radicular pain in his extremities. He seems fairly strong when sitting in a chair but this cannot get up and hold his balance. He had previous history of subdural hematoma in 2009 requiring left parietal craniotomy by Dr. Cyndy Freeze. He also has atrial fibrillation and is on Xarelto. He denies any prior history of strokes. He was seen by me in 2013 for benign essential tremor as well as mild cognitive impairment and was started on Inderal which helped his tremors as well as Cerefolin for his cognitive impairment both of which appear to be stable. He also has sleep apnea and supposed to wear his CPAP mask but admits that he has not been  using it regularly. He has prostate cancer which has been fairly stable as per Dr. Diona Fanti his urologist. He also sees Dr. Alen Blew hematologist for his chronic multifactorial mild anemia.  ROS:   14 system review of systems is positive for  gait difficulty, frequent falls, leg weakness, hand tremors, memory loss, decreased energy, urination problems, incontinence, blood in urine, importance, easy bruising and bleeding, anemia, blurred vision and all other systems negative  PMH:  Past Medical History  Diagnosis Date  . Orthostatic lightheadedness   . History of subdural hematoma     LEFT CHRONIC SUBDURAL HEMATOMA   S/P PARIETAL CRANIOTOMY WITH EVACUATION HEMATOMA  . Atrial fibrillation, persistent   . H/O cardiac radiofrequency ablation     A-FLUTTER  . Cardiac pacemaker last check 08-30-2013    05-05-2007  DD  MEDTRONIC GUIDANT INSIGNIA  . RBBB   . Benign essential tremor   . Diverticulosis of colon   . Tachy-brady syndrome   . SSS (sick sinus syndrome)   . Hydronephrosis, bilateral   . Renal insufficiency   . Recurrent prostate carcinoma     2005  S/P RADIOACTIVE SEED IMPLANTS/   BLADDER NECK MASS RESECTED 05/ 2015  SHOWED RECURRENT PROSTATE CANCER    Social History:  History   Social History  . Marital Status: Married    Spouse Name: margaret  . Number of Children: 2  .  Years of Education: BA   Occupational History  . retired    Social History Main Topics  . Smoking status: Never Smoker   . Smokeless tobacco: Never Used  . Alcohol Use: 12.6 oz/week    7 Glasses of wine, 7 Standard drinks or equivalent, 7 Shots of liquor per week     Comment: 2 glasses wine day  . Drug Use: No  . Sexual Activity: Not on file   Other Topics Concern  . Not on file   Social History Narrative   Patient lives at home with his spouse.   Caffeine Use: 2 cups of coffee    Medications:   Current Outpatient Prescriptions on File Prior to Visit  Medication Sig Dispense Refill  .  calcium citrate-vitamin D (CITRACAL+D) 315-200 MG-UNIT per tablet Take 1 tablet by mouth 2 (two) times daily.    . Glucosamine-Chondroitin 1500-1200 MG/30ML LIQD Take 30 mLs by mouth daily.    . Inulin (FIBER CHOICE PO) Take 2 capsules by mouth daily.     Marland Kitchen levothyroxine (SYNTHROID, LEVOTHROID) 137 MCG tablet Take 137 mcg by mouth daily before breakfast.    . megestrol (MEGACE) 20 MG tablet Take 20 mg by mouth 2 (two) times daily.    . Melatonin 3 MG TABS Take 2 tablets by mouth at bedtime. Takes again @ 4 am as needed.    . Methylfol-Methylcob-Acetylcyst (CEREFOLIN NAC) 6-2-600 MG TABS Take one tablet by mouth one time daily 90 each 3  . Multiple Vitamin (MULTIVITAMIN WITH MINERALS) TABS tablet Take 1 tablet by mouth daily.    . nitrofurantoin, macrocrystal-monohydrate, (MACROBID) 100 MG capsule Take 100 mg by mouth 2 (two) times daily.  0  . Omega-3 Krill Oil 300 MG CAPS Take 300 mg by mouth daily.    . propafenone (RYTHMOL) 225 MG tablet TAKE 1 TABLET (225 MG TOTAL) BY MOUTH 2 (TWO) TIMES DAILY. 60 tablet 6  . propranolol ER (INDERAL LA) 60 MG 24 hr capsule Take 60 mg by mouth daily at 2 am.    . rivaroxaban (XARELTO) 15 MG TABS tablet Take 1 tablet (15 mg total) by mouth daily with supper. (Patient taking differently: Take 15 mg by mouth at bedtime. At 10pm) 10 tablet 0  . solifenacin (VESICARE) 5 MG tablet Take 5 mg by mouth daily.    . tamsulosin (FLOMAX) 0.4 MG CAPS capsule Take 0.4 mg by mouth daily.    Marland Kitchen zolpidem (AMBIEN) 10 MG tablet Take 10 mg by mouth at bedtime as needed for sleep.   1   No current facility-administered medications on file prior to visit.    Allergies:  No Known Allergies  Physical Exam General: Frail elderly Caucasian male, seated, in no evident distress. Left parietal craniotomy defect from prior subdural hematoma evacuation surgery Head: head normocephalic and atraumatic.   Neck: supple with no carotid or supraclavicular bruits Cardiovascular: regular rate  and rhythm, no murmurs Musculoskeletal: no deformity Skin:  no rash/petichiae. Has pacemaker left infraclavicular. Vascular:  Normal pulses all extremities  Neurologic Exam Mental Status: Awake and fully alert. Oriented to place and time. Recent and remote memory intact. Attention span, concentration and fund of knowledge mildly diminished.. Mood and affect appropriate. Mini-Mental status exam scored 24/30 with deficits in orientation, attention, calculation. Animal naming test 8. Clock drawing 3/4. Glabellar tap and palmomental reflexes are absent Cranial Nerves: Fundoscopic exam reveals sharp disc margins. Pupils equal, briskly reactive to light. Extraocular movements full without nystagmus. Visual fields full to confrontation.  Hearing intact. Facial sensation intact. Face, tongue, palate moves normally and symmetrically.  Motor: Mild weakness of right lower extremity with hip flexors, knee extensors and ankle dorsiflexors 4/5. Tone is normal bilaterally. No resting tremor or cogwheel rigidity. Mild fine action tremor of outstretched upper extremities. Sensory.: Diminished touch , pinprick , position and vibratory sensation. In right lower extremity from hip down  Coordination: Rapid alternating movements normal in all extremities. Finger-to-nose and heel-to-shin performed accurately bilaterally. Gait and Station: Unable to stand up even with one person assist and tends to fall backwards and hence gait was not tested. Reflexes: 2+ and asymmetric brisker on the right. Toes downgoing.       ASSESSMENT: 55 year Caucasian male with a subacute decline in gait and balance with sudden worsening 1 week ago with inability to stand of unclear etiology. Possibilities include stroke versus recurrent subdural given multiple falls. Long-standing history of benign essential tremor and mild cognitive impairment which appears stable    PLAN: I had a long discussion with the patient and his wife regarding his  recent gait and balance difficulties and sudden onset of inability to walk since the last 1 week being worrisome for possible stroke versus subdural hematoma caused by his multiple falls. We will arrange for urgent brain CT scan today and also check carotid and constant and Doppler studies. MRI cannot be done due to his pacemaker. Family may need to look at moving patient in 2 to skilled nursing facilities as they're finding it difficult to care for him at home. I have advised them to discuss this with primary physician Dr. Felipa Eth. Continue Inderal for essential tremor and Cerefolin for mild cognitive impairment both of which appear to be stable. Continue Xarelto for stroke prevention for atrial fibrillation. Greater than 50 minutes of this 45 minute visit was spent and counseling and coordination of care. Return for follow-up in 2 months. Antony Contras, MD Note: This document was prepared with digital dictation and possible smart phrase technology. Any transcriptional errors that result from this process are unintentional.

## 2014-07-21 NOTE — ED Notes (Signed)
Pt states that he had three falls in a 48 hour period in mid June, may have hit his head, is on blood thinners and did not get medically screened at that time.

## 2014-07-21 NOTE — Patient Instructions (Signed)
I had a long discussion with the patient and his wife regarding his recent gait and balance difficulties and sudden onset of inability to walk since the last 1 week being worrisome for possible stroke versus subdural hematoma caused by his multiple falls. We will arrange for urgent brain CT scan today and also check carotid and constant and Doppler studies. MRI cannot be done due to his pacemaker. Family may need to look at moving patient in 2 to skilled nursing facilities as they're finding it difficult to care for him at home. I have advised them to discuss this with primary physician Dr. Felipa Eth. Continue Inderal for essential tremor and Cerefolin for mild cognitive impairment both of which appear to be stable. Continue Xarelto for stroke prevention for atrial fibrillation. Greater than 50 minutes of this 45 minute visit was spent and counseling and coordination of care. Return for follow-up in 2 months. Fall Prevention and Home Safety Falls cause injuries and can affect all age groups. It is possible to use preventive measures to significantly decrease the likelihood of falls. There are many simple measures which can make your home safer and prevent falls. OUTDOORS  Repair cracks and edges of walkways and driveways.  Remove high doorway thresholds.  Trim shrubbery on the main path into your home.  Have good outside lighting.  Clear walkways of tools, rocks, debris, and clutter.  Check that handrails are not broken and are securely fastened. Both sides of steps should have handrails.  Have leaves, snow, and ice cleared regularly.  Use sand or salt on walkways during winter months.  In the garage, clean up grease or oil spills. BATHROOM  Install night lights.  Install grab bars by the toilet and in the tub and shower.  Use non-skid mats or decals in the tub or shower.  Place a plastic non-slip stool in the shower to sit on, if needed.  Keep floors dry and clean up all water on the  floor immediately.  Remove soap buildup in the tub or shower on a regular basis.  Secure bath mats with non-slip, double-sided rug tape.  Remove throw rugs and tripping hazards from the floors. BEDROOMS  Install night lights.  Make sure a bedside light is easy to reach.  Do not use oversized bedding.  Keep a telephone by your bedside.  Have a firm chair with side arms to use for getting dressed.  Remove throw rugs and tripping hazards from the floor. KITCHEN  Keep handles on pots and pans turned toward the center of the stove. Use back burners when possible.  Clean up spills quickly and allow time for drying.  Avoid walking on wet floors.  Avoid hot utensils and knives.  Position shelves so they are not too high or low.  Place commonly used objects within easy reach.  If necessary, use a sturdy step stool with a grab bar when reaching.  Keep electrical cables out of the way.  Do not use floor polish or wax that makes floors slippery. If you must use wax, use non-skid floor wax.  Remove throw rugs and tripping hazards from the floor. STAIRWAYS  Never leave objects on stairs.  Place handrails on both sides of stairways and use them. Fix any loose handrails. Make sure handrails on both sides of the stairways are as long as the stairs.  Check carpeting to make sure it is firmly attached along stairs. Make repairs to worn or loose carpet promptly.  Avoid placing throw rugs at the  top or bottom of stairways, or properly secure the rug with carpet tape to prevent slippage. Get rid of throw rugs, if possible.  Have an electrician put in a light switch at the top and bottom of the stairs. OTHER FALL PREVENTION TIPS  Wear low-heel or rubber-soled shoes that are supportive and fit well. Wear closed toe shoes.  When using a stepladder, make sure it is fully opened and both spreaders are firmly locked. Do not climb a closed stepladder.  Add color or contrast paint or tape  to grab bars and handrails in your home. Place contrasting color strips on first and last steps.  Learn and use mobility aids as needed. Install an electrical emergency response system.  Turn on lights to avoid dark areas. Replace light bulbs that burn out immediately. Get light switches that glow.  Arrange furniture to create clear pathways. Keep furniture in the same place.  Firmly attach carpet with non-skid or double-sided tape.  Eliminate uneven floor surfaces.  Select a carpet pattern that does not visually hide the edge of steps.  Be aware of all pets. OTHER HOME SAFETY TIPS  Set the water temperature for 120 F (48.8 C).  Keep emergency numbers on or near the telephone.  Keep smoke detectors on every level of the home and near sleeping areas. Document Released: 12/21/2001 Document Revised: 07/02/2011 Document Reviewed: 03/22/2011 [facility] Patient Information 2015 [place], [place]. This information is not intended to replace advice given to you by your health care provider. Make sure you discuss any questions you have with your health care provider.

## 2014-07-21 NOTE — ED Notes (Signed)
Patient transported to X-ray 

## 2014-07-21 NOTE — ED Notes (Signed)
Pt c/o worsening generalized weakness over the last week particularly in his right leg.  Pt states it is to the point that he needs help standing up to go to the bathroom.  PCP sent pt here for a CT scan to r/o stroke.  Pt's grip strength is equal, no arm drift, no facial palsy or droop and denies numbness or tingling.  Pt has a condom catheter that is draining red/brown urine that is normal for pt for the last several months.  Pt denies any pain, denies SOB, denies chest pain, alert and oriented x 4.

## 2014-07-21 NOTE — Care Management (Signed)
ED CM met with patient and daughter family in process of getting patient in [facility], as early as Monday as per daughter. Patient unable to ambulate, independently. Care givers available at home as per daughter Ann [phone]. patient would benefit from 3 n 1 potty chair. DME order placed, Offered choice [facility] selected. referral faxed to [facility]. Dr. Docherty updated with disposition plan. ED CM will follow up with patient and family tomorrow.   

## 2014-07-21 NOTE — ED Provider Notes (Signed)
CSN: [id number]     Arrival date & time 07/21/14  2203 History   First MD Initiated Contact with Patient 07/21/14 2214     Chief Complaint  Patient presents with  . Weakness     (Consider location/radiation/quality/duration/timing/severity/associated sxs/prior Treatment) Patient is a 81 y.o. male presenting with weakness. The history is provided by the patient and a relative. No language interpreter was used.  Weakness This is a new problem. The current episode started more than 2 days ago (1 week). The problem occurs constantly. The problem has been gradually worsening. Pertinent negatives include no chest pain, no abdominal pain, no headaches and no shortness of breath. Associated symptoms comments: Fatigue . Exacerbated by: standing. The symptoms are relieved by rest. He has tried nothing for the symptoms. The treatment provided no relief.    Past Medical History  Diagnosis Date  . Orthostatic lightheadedness   . History of subdural hematoma     LEFT CHRONIC SUBDURAL HEMATOMA   S/P PARIETAL CRANIOTOMY WITH EVACUATION HEMATOMA  . Atrial fibrillation, persistent   . H/O cardiac radiofrequency ablation     A-FLUTTER  . Cardiac pacemaker last check 08-30-2013    05-05-2007  DD  [employer]TR[employer] GUIDANT INSIGNIA  . RBBB   . Benign essential tremor   . Diverticulosis of colon   . Tachy-brady syndrome   . SSS (sick sinus syndrome)   . Hydronephrosis, bilateral   . Renal insufficiency   . Recurrent prostate carcinoma     2005  S/P RADIOACTIVE SEED IMPLANTS/   BLADDER NECK MASS RESECTED 05/ 2015  SHOWED RECURRENT PROSTATE CANCER   Past Surgical History  Procedure Laterality Date  . Tonsillectomy  as child  . Subdural hematoma evacuation via craniotomy  12-16-2007  . Rotator cuff repair w/ distal clavicle excision Left 08-31-2010  . Pacemaker insertion  05-05-2007      Guidant Insignia [employer])  . Total knee arthroplasty  12/18/2010    Procedure: TOTAL KNEE ARTHROPLASTY;  Surgeon:  Mauri Pole;  Location: [facility];  Service: Orthopedics;  Laterality: Left;  . Cataract extraction    . Cystoscopy w/ ureteral stent placement Bilateral 05/30/2013    Procedure: CYSTOSCOPY WITH RETROGRADE PYELOGRAM/ BILATERAL URETERAL STENT PLACEMENT, TRANSURETRAL RESECTION OF BLADDER TUMOR WITH GYRUS;  Surgeon: Alexis Frock, MD;  Location: [facility];  Service: Urology;  Laterality: Bilateral;  . Cardioversion N/A 08/04/2013    Procedure: CARDIOVERSION;  Surgeon: Sanda Klein, MD;  Location: [facility];  Service: Cardiovascular;  Laterality: N/A;  . Knee arthroscopy Left 02-11-2007  . Cardioversion  multiple--  last one 08-04-2013    SINCE 2008  . Cardiac electrophysiology mapping and ablation  04-06-2007  dr Caryl Comes  . Cardiac catheterization  12-22-2006      non-obstructive CAD / RCA 20%/  ef 50%/  a-fib  . Radioactive prostate seed implants  12-21-2003  . Orif left distal clavical fx  08-31-2010  . Colonoscopy w/ polypectomy  12-01-2008  . Transthoracic echocardiogram  05-31-2013    MILD LVH/  EF 65%/  MODERATE LAE/  TRIVIAL MR  &  TR  . Cystoscopy/retrograde/ureteroscopy Right 09/16/2013    Procedure: CYSTOSCOPY/RETROGRADE/URETEROSCOPIC STENT EXTRACTION/  bil retrograde pyelogram with inseertion of bilateral stents;  Surgeon: Jorja Loa, MD;  Location: [facility];  Service: Urology;  Laterality: Right;   Family History  Problem Relation Age of Onset  . Aneurysm Father   . Stroke Father    History  Substance Use Topics  . Smoking status: Never  Smoker   . Smokeless tobacco: Never Used  . Alcohol Use: 12.6 oz/week    7 Glasses of wine, 7 Standard drinks or equivalent, 7 Shots of liquor per week     Comment: 2 glasses wine day    Review of Systems  Constitutional: Positive for fatigue. Negative for fever, activity change and appetite change.  HENT: Negative for congestion, facial swelling, rhinorrhea and trouble swallowing.   Eyes: Negative for photophobia  and pain.  Respiratory: Negative for cough, chest tightness and shortness of breath.   Cardiovascular: Negative for chest pain and leg swelling.  Gastrointestinal: Negative for nausea, vomiting, abdominal pain, diarrhea and constipation.  Endocrine: Negative for polydipsia and polyuria.  Genitourinary: Negative for dysuria, urgency, decreased urine volume and difficulty urinating.  Musculoskeletal: Negative for back pain and gait problem.  Skin: Negative for color change, rash and wound.  Allergic/Immunologic: Negative for immunocompromised state.  Neurological: Positive for weakness. Negative for dizziness, facial asymmetry, speech difficulty, numbness and headaches.  Psychiatric/Behavioral: Negative for confusion, decreased concentration and agitation.      Allergies  Review of patient's allergies indicates no known allergies.  Home Medications   Prior to Admission medications   Medication Sig Start Date End Date Taking? Authorizing Provider  calcium citrate-vitamin D (CITRACAL+D) 315-200 MG-UNIT per tablet Take 1 tablet by mouth 2 (two) times daily.    Historical Provider, MD  Glucosamine-Chondroitin 1500-1200 MG/30ML LIQD Take 30 mLs by mouth daily.    Historical Provider, MD  Inulin (FIBER CHOICE PO) Take 2 capsules by mouth daily.     Historical Provider, MD  levothyroxine (SYNTHROID, LEVOTHROID) 137 MCG tablet Take 137 mcg by mouth daily before breakfast.    Historical Provider, MD  megestrol (MEGACE) 20 MG tablet Take 20 mg by mouth 2 (two) times daily.    Historical Provider, MD  Melatonin 3 MG TABS Take 2 tablets by mouth at bedtime. Takes again @ 4 am as needed.    Historical Provider, MD  Methylfol-Methylcob-Acetylcyst (CEREFOLIN NAC) 6-2-600 MG TABS Take one tablet by mouth one time daily 09/05/13   Garvin Fila, MD  Multiple Vitamin (MULTIVITAMIN WITH MINERALS) TABS tablet Take 1 tablet by mouth daily.    Historical Provider, MD  nitrofurantoin, macrocrystal-monohydrate,  (MACROBID) 100 MG capsule Take 100 mg by mouth 2 (two) times daily. 01/29/14   Historical Provider, MD  Omega-3 Krill Oil 300 MG CAPS Take 300 mg by mouth daily.    Historical Provider, MD  pramipexole (MIRAPEX) 0.125 MG tablet Take 1 tablet by mouth daily. 07/11/14   Historical Provider, MD  propafenone (RYTHMOL) 225 MG tablet TAKE 1 TABLET (225 MG TOTAL) BY MOUTH 2 (TWO) TIMES DAILY. 06/06/14   Deboraha Sprang, MD  propranolol (INDERAL) 10 MG tablet Take 10 mg by mouth 3 (three) times daily. 07/16/14   Historical Provider, MD  propranolol ER (INDERAL LA) 60 MG 24 hr capsule Take 60 mg by mouth daily at 2 am.    Historical Provider, MD  rivaroxaban (XARELTO) 15 MG TABS tablet Take 1 tablet (15 mg total) by mouth daily with supper. Patient taking differently: Take 15 mg by mouth at bedtime. At 10pm 05/09/14   Deboraha Sprang, MD  solifenacin (VESICARE) 5 MG tablet Take 5 mg by mouth daily.    Historical Provider, MD  tamsulosin (FLOMAX) 0.4 MG CAPS capsule Take 0.4 mg by mouth daily.    Historical Provider, MD  zolpidem (AMBIEN) 10 MG tablet Take 10 mg by mouth at bedtime  as needed for sleep.  03/29/14   Historical Provider, MD   BP 122/60 mmHg  Pulse 60  Temp(Src) 98.1 F (36.7 C) (Oral)  Resp 18  Ht 6' (1.829 m)  Wt 175 lb (79.379 kg)  BMI 23.73 kg/m2  SpO2 98% Physical Exam  Constitutional: He is oriented to person, place, and time. He appears well-developed and well-nourished. No distress.  HENT:  Head: Normocephalic and atraumatic.  Mouth/Throat: No oropharyngeal exudate.  Eyes: Pupils are equal, round, and reactive to light.  Neck: Normal range of motion. Neck supple.  Cardiovascular: Normal rate, regular rhythm and normal heart sounds.  Exam reveals no gallop and no friction rub.   No murmur heard. Pulmonary/Chest: Effort normal and breath sounds normal. No respiratory distress. He has no wheezes. He has no rales.  Abdominal: Soft. Bowel sounds are normal. He exhibits no distension and  no mass. There is no tenderness. There is no rebound and no guarding.  Musculoskeletal: Normal range of motion. He exhibits no edema or tenderness.  Neurological: He is alert and oriented to person, place, and time. He has normal strength. He displays no tremor. No cranial nerve deficit or sensory deficit. He exhibits normal muscle tone. Gait abnormal. GCS eye subscore is 4. GCS verbal subscore is 5. GCS motor subscore is 6.  Reflex Scores:      Patellar reflexes are 2+ on the right side and 2+ on the left side. 4/5 strength against gravity in bed, but cannot stand or ambulate due ot BLLE weakness, denies pain. Strength, sensation and reflexes symmetric.   Skin: Skin is warm and dry. There is pallor.  Psychiatric: He has a normal mood and affect.    ED Course  Procedures (including critical care time) Labs Review Labs Reviewed  CBC - Abnormal; Notable for the following:    RBC 3.84 (*)    Hemoglobin 9.8 (*)    HCT 30.9 (*)    MCH 25.5 (*)    RDW 21.5 (*)    All other components within normal limits  BASIC METABOLIC PANEL - Abnormal; Notable for the following:    Sodium 133 (*)    CO2 19 (*)    Glucose, Bld 152 (*)    BUN 33 (*)    Creatinine, Ser 1.84 (*)    GFR calc non Af Amer 33 (*)    GFR calc Af Amer 38 (*)    All other components within normal limits  PROTIME-INR - Abnormal; Notable for the following:    Prothrombin Time 27.6 (*)    INR 2.62 (*)    All other components within normal limits  I-STAT CHEM 8, ED - Abnormal; Notable for the following:    BUN 32 (*)    Creatinine, Ser 1.80 (*)    Glucose, Bld 154 (*)    Hemoglobin 9.9 (*)    HCT 29.0 (*)    All other components within normal limits  URINE CULTURE  URINALYSIS, ROUTINE W REFLEX MICROSCOPIC (NOT AT [facility])    Imaging Review Dg Chest 2 View  07/21/2014   CLINICAL DATA:  81 year old male with weakness  EXAM: CHEST  2 VIEW  COMPARISON:  Radiograph dated 07/10/2013  FINDINGS: Left pectoral pacemaker device. The  heart size and mediastinal contours are within normal limits. Both lungs are clear. The visualized skeletal structures are unremarkable.  IMPRESSION: No active cardiopulmonary disease.   Electronically Signed   By: Anner Crete M.D.   On: 07/21/2014 23:57   Ct Head  Wo Contrast  07/21/2014   CLINICAL DATA:  Progressive bilateral lower extremity weakness over the past few days.  EXAM: CT HEAD WITHOUT CONTRAST  TECHNIQUE: Contiguous axial images were obtained from the base of the skull through the vertex without intravenous contrast.  COMPARISON:  10/16/2010  FINDINGS: Skull and Sinuses:Stable remote left parietal craniotomy. Reportedly this was for subdural hematoma evacuation in 2009.  No sinus or mastoid effusion.  Orbits: Left cataract resection.  Brain: No evidence of acute or interval infarction, hemorrhage, hydrocephalus, or mass lesion/mass effect. There is progressive brain atrophy and ventriculomegaly since 2012. Small vessel ischemic gliosis noted around the lateral ventricles and in the left subinsular white matter, stable.  IMPRESSION: 1. No acute findings. 2. Brain atrophy which has progressed from 2012.   Electronically Signed   By: Monte Fantasia M.D.   On: 07/21/2014 22:47     EKG Interpretation None      MDM   Final diagnoses:  Generalized weakness    Pt is a 81 y.o. male with Pmhx as above who presents with generalized worsening weakness BLLE, w/o leg pain or focal weakness, as well as gradual onset worsening fatigue.  He had 3 falls in June, has not had a recent fall.  He did hit his head in June and is on Xarelto for  A. Fib.  On physical exam, vital signs are stable.  He is in no acute distress.  He has no focal neuro findings.  Strength in bilateral lower extremities is symmetric when lying in bed, however, he cannot stand under his own will.  Reflexes are 2+.  He denies low back pain, bowel or bladder incontinence, or fevers.  CT head is no acute findings.  Hemoglobin is  stable creatinine is near baseline.  Patient is not a candidate for an MRI due to pacemaker, and given, and weakness appears symmetric, I doubt acute CVA.  I also doubt cord compression or cauda equina given strength and sensation are equal in lower extremities, He has no back pain, Or bowel or bladder incontinence.  CXR nml.   Case management speak to patient and daughter.  They have set patient up to go into an nursing facility on Monday. I feel given essentially benign neuro exam and w/u, He is safe to continue outpt w/u with Dr. Felipa Eth and is appropriate to go to nursing facility on Monday. Dr. Hubert Azure will f/u on UA, will d/c per my instructions if nml, if + for UTI, can be started on outpt tx.    Johnette Abraham evaluation in the Emergency Department is complete. It has been determined that no acute conditions requiring further emergency intervention are present at this time. The patient/guardian have been advised of the diagnosis and plan. We have discussed signs and symptoms that warrant return to the ED, such as changes or worsening in symptoms, worsening pain, focal neuro findings, fever.       Ernestina Patches, MD 07/22/14 [phone]

## 2014-07-22 ENCOUNTER — Other Ambulatory Visit: Payer: Self-pay | Admitting: Neurology

## 2014-07-22 ENCOUNTER — Telehealth: Payer: Self-pay | Admitting: Neurology

## 2014-07-22 DIAGNOSIS — R29898 Other symptoms and signs involving the musculoskeletal system: Secondary | ICD-10-CM

## 2014-07-22 LAB — URINALYSIS, ROUTINE W REFLEX MICROSCOPIC
Glucose, UA: NEGATIVE mg/dL
Ketones, ur: 15 mg/dL — AB
NITRITE: POSITIVE — AB
PH: 6 (ref 5.0–8.0)
Protein, ur: >300 mg/dL — AB
SPECIFIC GRAVITY, URINE: 1.019 (ref 1.005–1.030)
UROBILINOGEN UA: 0.2 mg/dL (ref 0.0–1.0)

## 2014-07-22 LAB — URINE MICROSCOPIC-ADD ON

## 2014-07-22 MED ORDER — DEXTROSE 5 % IV SOLN
1.0000 g | Freq: Once | INTRAVENOUS | Status: AC
  Administered 2014-07-22: 1 g via INTRAVENOUS
  Filled 2014-07-22: qty 10

## 2014-07-22 MED ORDER — CEPHALEXIN 500 MG PO CAPS: 500.0000 mg | ORAL_CAPSULE | Freq: Two times a day (BID) | ORAL | Status: DC

## 2014-07-22 NOTE — ED Notes (Signed)
[facility] called for Transport 

## 2014-07-22 NOTE — ED Provider Notes (Signed)
  Physical Exam  BP 141/67 mmHg  Pulse 60  Temp(Src) 98.1 F (36.7 C) (Oral)  Resp 17  Ht 6' (1.829 m)  Wt 175 lb (79.379 kg)  BMI 23.73 kg/m2  SpO2 100%  Physical Exam  ED Course  Procedures  MDM  Assuming care from Dr. Tawnya Crook. Pt in the ER for weakness, generalized. All the workup thus far is neg, i was advised to f/u on the UA as there are concerns for UTI. Pt does have uti - ceftriaxone will be given, and pt is stable for discharge with oral antibiotics and close f/u.      Varney Biles, MD 07/22/14 [phone]

## 2014-07-22 NOTE — Telephone Encounter (Signed)
Patient's daughter had called yesterday stating patient had acute worsening of his weakness and hence further advice to go to the emergency room where urgent CT scan revealed no acute abnormality. There are mild changes of progressive cortical atrophy and ventriculomegaly. Patient was found to have mild UTI and was started on Antibiotics and discharged home. I spoke to the patient's wife today and recommended checking CT scan of the cervical, thoracic and lumbar spine to rule out any significant acute compressive lesion even the sudden onset of leg weakness and walking difficulties. We unfortunately cannot obtain MRI due to patient having pacemaker or give contrast due to creatinine being elevated at 1.8

## 2014-07-22 NOTE — Discharge Instructions (Signed)
Urinary Tract Infection Urinary tract infections (UTIs) can develop anywhere along your urinary tract. Your urinary tract is your body's drainage system for removing wastes and extra water. Your urinary tract includes two kidneys, two ureters, a bladder, and a urethra. Your kidneys are a pair of bean-shaped organs. Each kidney is about the size of your fist. They are located below your ribs, one on each side of your spine. CAUSES Infections are caused by microbes, which are microscopic organisms, including fungi, viruses, and bacteria. These organisms are so small that they can only be seen through a microscope. Bacteria are the microbes that most commonly cause UTIs. SYMPTOMS  Symptoms of UTIs may vary by age and gender of the patient and by the location of the infection. Symptoms in young women typically include a frequent and intense urge to urinate and a painful, burning feeling in the bladder or urethra during urination. Older women and men are more likely to be tired, shaky, and weak and have muscle aches and abdominal pain. A fever may mean the infection is in your kidneys. Other symptoms of a kidney infection include pain in your back or sides below the ribs, nausea, and vomiting. DIAGNOSIS To diagnose a UTI, your caregiver will ask you about your symptoms. Your caregiver also will ask to provide a urine sample. The urine sample will be tested for bacteria and white blood cells. White blood cells are made by your body to help fight infection. TREATMENT  Typically, UTIs can be treated with medication. Because most UTIs are caused by a bacterial infection, they usually can be treated with the use of antibiotics. The choice of antibiotic and length of treatment depend on your symptoms and the type of bacteria causing your infection. HOME CARE INSTRUCTIONS  If you were prescribed antibiotics, take them exactly as your caregiver instructs you. Finish the medication even if you feel better after you  have only taken some of the medication.  Drink enough water and fluids to keep your urine clear or pale yellow.  Avoid caffeine, tea, and carbonated beverages. They tend to irritate your bladder.  Empty your bladder often. Avoid holding urine for long periods of time.  Empty your bladder before and after sexual intercourse.  After a bowel movement, women should cleanse from front to back. Use each tissue only once. SEEK MEDICAL CARE IF:   You have back pain.  You develop a fever.  Your symptoms do not begin to resolve within 3 days. SEEK IMMEDIATE MEDICAL CARE IF:   You have severe back pain or lower abdominal pain.  You develop chills.  You have nausea or vomiting.  You have continued burning or discomfort with urination. MAKE SURE YOU:   Understand these instructions.  Will watch your condition.  Will get help right away if you are not doing well or get worse. Document Released: 10/10/2004 Document Revised: 07/02/2011 Document Reviewed: 02/08/2011 [facility] Patient Information 2015 [place], [place]. This information is not intended to replace advice given to you by your health care provider. Make sure you discuss any questions you have with your health care provider.   Weakness Weakness is a lack of strength. It may be felt all over the body (generalized) or in one specific part of the body (focal). Some causes of weakness can be serious. You may need further medical evaluation, especially if you are elderly or you have a history of immunosuppression (such as chemotherapy or HIV), kidney disease, heart disease, or diabetes. CAUSES  Weakness can  be caused by many different things, including:  Infection.  Physical exhaustion.  Internal bleeding or other blood loss that results in a lack of red blood cells (anemia).  Dehydration. This cause is more common in elderly people.  Side effects or electrolyte abnormalities from medicines, such as pain medicines or  sedatives.  Emotional distress, anxiety, or depression.  Circulation problems, especially severe peripheral arterial disease.  Heart disease, such as rapid atrial fibrillation, bradycardia, or heart failure.  Nervous system disorders, such as Guillain-Barr syndrome, multiple sclerosis, or stroke. DIAGNOSIS  To find the cause of your weakness, your caregiver will take your history and perform a physical exam. Lab tests or X-rays may also be ordered, if needed. TREATMENT  Treatment of weakness depends on the cause of your symptoms and can vary greatly. HOME CARE INSTRUCTIONS   Rest as needed.  Eat a well-balanced diet.  Try to get some exercise every day.  Only take over-the-counter or prescription medicines as directed by your caregiver. SEEK MEDICAL CARE IF:   Your weakness seems to be getting worse or spreads to other parts of your body.  You develop new aches or pains. SEEK IMMEDIATE MEDICAL CARE IF:   You cannot perform your normal daily activities, such as getting dressed and feeding yourself.  You cannot walk up and down stairs, or you feel exhausted when you do so.  You have shortness of breath or chest pain.  You have difficulty moving parts of your body.  You have weakness in only one area of the body or on only one side of the body.  You have a fever.  You have trouble speaking or swallowing.  You cannot control your bladder or bowel movements.  You have black or bloody vomit or stools. MAKE SURE YOU:  Understand these instructions.  Will watch your condition.  Will get help right away if you are not doing well or get worse. Document Released: 12/31/2004 Document Revised: 07/02/2011 Document Reviewed: 03/01/2011 [facility] Patient Information 2015 [place], [place]. This information is not intended to replace advice given to you by your health care provider. Make sure you discuss any questions you have with your health care provider.

## 2014-07-22 NOTE — ED Notes (Signed)
Pt and caregiver verbalized understanding of d/c instructions and has no further questions. Pt going home with [facility].

## 2014-07-24 LAB — URINE CULTURE: Culture: >=100000

## 2014-07-25 ENCOUNTER — Telehealth: Payer: Self-pay | Admitting: Internal Medicine

## 2014-07-25 NOTE — Telephone Encounter (Signed)
Spoke with patient's dtr. She was asking if patient should be cutting propafenone in half, because he cut something in half and they aren't sure which medication it was. She is aware pt should be taking 225 mg tablet BID.

## 2014-07-25 NOTE — Telephone Encounter (Signed)
New message  Pt c/o medication issue:  1. Name of Medication:  propafenone HCL    4. What is your medication issue? Pt daughter called to discuss medications. Pt had been cutting this particular medication in half and the pt daughter is requesting a call back to determine if the pt should continue to cut this medication in half, Please call

## 2014-07-25 NOTE — Telephone Encounter (Signed)
New message     Pt left a message earlier today.  They forgot to ask to be called back before noon.

## 2014-07-26 ENCOUNTER — Telehealth: Payer: Self-pay | Admitting: *Deleted

## 2014-07-26 NOTE — ED Notes (Signed)
(+)  urine culture, treated with Cephalexin, OK per Cherlynn Polo, Pharm

## 2014-07-27 ENCOUNTER — Other Ambulatory Visit: Payer: PPO

## 2014-07-28 ENCOUNTER — Ambulatory Visit (INDEPENDENT_AMBULATORY_CARE_PROVIDER_SITE_OTHER): Payer: PPO

## 2014-07-28 ENCOUNTER — Ambulatory Visit
Admission: RE | Admit: 2014-07-28 | Discharge: 2014-07-28 | Disposition: A | Discharge status: Home / Self Care | Payer: PPO | Source: Ambulatory Visit | Attending: Neurology | Admitting: Neurology

## 2014-07-28 DIAGNOSIS — R29898 Other symptoms and signs involving the musculoskeletal system: Secondary | ICD-10-CM

## 2014-07-29 ENCOUNTER — Other Ambulatory Visit: Payer: Self-pay

## 2014-07-29 DIAGNOSIS — M5416 Radiculopathy, lumbar region: Secondary | ICD-10-CM

## 2014-07-29 NOTE — Telephone Encounter (Signed)
I spoke to the patient's wife and gave him results of recent spine CT showing right lumbar radiculopathy at L2 which may be contributing to his new right leg weakness and difficulty walking and falling. I recommend consult Dr. Christella Noa neurosurgeon whom he has seen in the past to discuss possible surgery if suitable. He will stay at [facility] and get physical therapy meanwhile

## 2014-08-01 ENCOUNTER — Telehealth: Payer: Self-pay | Admitting: Physician Assistant

## 2014-08-01 NOTE — Telephone Encounter (Signed)
Pt's wife called lft msg requesting what was scheduled on 07/18 with [place], called back lft msg stating there was nothing with [place] for today that he had an apt with [place] on 08/04,mailed schedule and advised to c/b if they have any ?'s.... KJ

## 2014-08-04 ENCOUNTER — Telehealth: Payer: Self-pay | Admitting: Neurology

## 2014-08-04 NOTE — Telephone Encounter (Signed)
I spoke to the patient's wife and clarified that I have made an outpatient referral to Dr. Cyndy Freeze to evaluate the patient and imaging studies but that has not yet happened. Patient is currently at [facility] getting physical therapy and rehabilitation. I advised her to call Dr. Hewitt Shorts office to see when he has his outpatient appointment to see him

## 2014-08-04 NOTE — Telephone Encounter (Signed)
Dr. Leonie Man has information for the patient if the patient needs to have surgery or not. They also need to know if they should keep [name] in rehab for a little bit or to remove him. The best number to contact is [phone]

## 2014-08-05 ENCOUNTER — Telehealth: Payer: Self-pay | Admitting: Internal Medicine

## 2014-08-05 NOTE — Telephone Encounter (Signed)
The pts wife, [name], states that the pt is no longer able to walk or even stand and is in a wheelchair at this time.  She is concerned that Xarelto may have something to do with the pts inability to walk or stand as she states "I have seen a lot of bad things about Xarelto on TV".  The pt is advised per Elberta Leatherwood, Pharm D that Xarelto will not cause the pt to be unable to walk or stand. She verbalized understanding and states that she just wanted to be sure.  Also, [name] wants Dr Caryl Comes to be aware that the pt has been in a nursing facility, [facility], for the past 2 weeks and the plan is for him to be there at least another week or 2.   She is advised that I am forwarding this message to Dr Caryl Comes and his nurse Judeen Hammans as Juluis Rainier.

## 2014-08-05 NOTE — Telephone Encounter (Signed)
New message   Pt wife states patient is taking xarelto and wife wants to make sure Dr. Caryl Comes knows he is taking this medication Pt wife states pt is having problem with walking and cannot stand on his own. Pt wife is concerned that xarelto maybe causing the problem with his legs.  Pt c/o medication issue:  1. Name of Medication: Xarelto  2. How are you currently taking this medication (dosage and times per day)? 15mg   3. Are you having a reaction (difficulty breathing--STAT)? No  4. What is your medication issue? Pt cannot stand on his own nor walk  Please call to discuss

## 2014-08-17 ENCOUNTER — Telehealth: Payer: Self-pay | Admitting: Internal Medicine

## 2014-08-17 NOTE — Telephone Encounter (Signed)
Informed her that we would address today.

## 2014-08-17 NOTE — Telephone Encounter (Signed)
Faxed clearance request, to [facility] to stop Xarelto 3 days prior to myelogram.

## 2014-08-17 NOTE — Telephone Encounter (Signed)
New message     Did you receive a clearance for surgery fax?

## 2014-08-18 ENCOUNTER — Other Ambulatory Visit (HOSPITAL_COMMUNITY): Payer: Self-pay | Admitting: Neurosurgery

## 2014-08-18 ENCOUNTER — Other Ambulatory Visit (HOSPITAL_BASED_OUTPATIENT_CLINIC_OR_DEPARTMENT_OTHER): Payer: PPO

## 2014-08-18 ENCOUNTER — Telehealth: Payer: Self-pay | Admitting: Oncology

## 2014-08-18 ENCOUNTER — Encounter: Payer: Self-pay | Admitting: Physician Assistant

## 2014-08-18 ENCOUNTER — Ambulatory Visit (HOSPITAL_BASED_OUTPATIENT_CLINIC_OR_DEPARTMENT_OTHER): Payer: PPO | Admitting: Physician Assistant

## 2014-08-18 ENCOUNTER — Other Ambulatory Visit: Payer: Self-pay | Admitting: Neurosurgery

## 2014-08-18 VITALS — BP 121/60 | HR 63 | Temp 97.6°F | Resp 18 | Ht 72.0 in

## 2014-08-18 DIAGNOSIS — N189 Chronic kidney disease, unspecified: Secondary | ICD-10-CM

## 2014-08-18 DIAGNOSIS — C61 Malignant neoplasm of prostate: Secondary | ICD-10-CM

## 2014-08-18 DIAGNOSIS — D631 Anemia in chronic kidney disease: Secondary | ICD-10-CM | POA: Diagnosis not present

## 2014-08-18 DIAGNOSIS — I4891 Unspecified atrial fibrillation: Secondary | ICD-10-CM

## 2014-08-18 DIAGNOSIS — G912 (Idiopathic) normal pressure hydrocephalus: Secondary | ICD-10-CM

## 2014-08-18 DIAGNOSIS — D500 Iron deficiency anemia secondary to blood loss (chronic): Secondary | ICD-10-CM

## 2014-08-18 DIAGNOSIS — D509 Iron deficiency anemia, unspecified: Secondary | ICD-10-CM

## 2014-08-18 LAB — CBC WITH DIFFERENTIAL/PLATELET
BASO%: 0.7 % (ref 0.0–2.0)
Basophils Absolute: 0 10*3/uL (ref 0.0–0.1)
EOS ABS: 0.1 10*3/uL (ref 0.0–0.5)
EOS%: 1.9 % (ref 0.0–7.0)
HCT: 29.5 % — ABNORMAL LOW (ref 38.4–49.9)
HGB: 9.8 g/dL — ABNORMAL LOW (ref 13.0–17.1)
LYMPH%: 10.7 % — ABNORMAL LOW (ref 14.0–49.0)
MCH: 28.3 pg (ref 27.2–33.4)
MCHC: 33.2 g/dL (ref 32.0–36.0)
MCV: 85.2 fL (ref 79.3–98.0)
MONO#: 0.4 10*3/uL (ref 0.1–0.9)
MONO%: 6.3 % (ref 0.0–14.0)
NEUT#: 5.6 10*3/uL (ref 1.5–6.5)
NEUT%: 80.4 % — AB (ref 39.0–75.0)
PLATELETS: 281 10*3/uL (ref 140–400)
RBC: 3.46 10*6/uL — ABNORMAL LOW (ref 4.20–5.82)
RDW: 26.7 % — ABNORMAL HIGH (ref 11.0–14.6)
WBC: 6.9 10*3/uL (ref 4.0–10.3)
lymph#: 0.7 10*3/uL — ABNORMAL LOW (ref 0.9–3.3)

## 2014-08-18 LAB — IRON AND TIBC CHCC
%SAT: 18 % — ABNORMAL LOW (ref 20–55)
Iron: 39 ug/dL — ABNORMAL LOW (ref 42–163)
TIBC: 216 ug/dL (ref 202–409)
UIBC: 177 ug/dL (ref 117–376)

## 2014-08-18 LAB — FERRITIN CHCC: Ferritin: 270 ng/ml (ref 22–316)

## 2014-08-18 LAB — HOLD TUBE, BLOOD BANK

## 2014-08-18 MED ORDER — DARBEPOETIN ALFA 300 MCG/0.6ML IJ SOSY
300.0000 ug | PREFILLED_SYRINGE | Freq: Once | INTRAMUSCULAR | Status: AC
  Administered 2014-08-18: 300 ug via SUBCUTANEOUS
  Filled 2014-08-18: qty 0.6

## 2014-08-18 NOTE — Telephone Encounter (Signed)
lvm for pt regarding to Aug appt... °

## 2014-08-18 NOTE — Progress Notes (Signed)
Hematology and Oncology Follow Up Visit  [name] [id number] 1931/09/02 81 y.o. 08/18/2014 3:03 PM [name], MDStoneking, Hal, MD   Principle Diagnosis: 81 year old gentleman with multifactorial anemia he has an element of anemia of chronic disease, renal insufficiency and iron deficiency. He could have an element of myelodysplasia. He has also history of prostate cancer.  Prior Therapy:  His prostate cancer dates back to 2005 where he had a Gleason score 3+3 = 6. His PSA was 4.4. He was treated with brachytherapy and seed implants and 12/21/2003. His PSA did rise up to 2.89 and found to have local recurrence of his prostate cancer into the bladder neck. He underwent a double J stent placement and his staging workup did not show any widespread metastasis. He was treated with androgen deprivation with Mills Koller and his PSA dropped down to 0.93. He is currently receiving Lupron every 4 months under the care of Dr. Diona Fanti.  He is status post bone marrow biopsy done on 06/16/2014 that showed no evidence of malignancy and scarce iron stores.   Current therapy: Aranesp 300 g every 3 weeks to keep his hemoglobin above 11. He also will receive IV iron as well as packed red cell transfusion as needed.  Interim History:  [name] presents today for a follow-up visit with his wife. Since the last visit, he has been placed in [place] for rehabilitation due to significant weakness. He is unable to stand due to weakness in his lower extremities. The etiology of this weakness is unclear. He is receiving physical therapy. He presents to discuss the results of his bone marrow biopsy as well as resume his Aranesp injections if indicated. His bone marrow biopsy performed 06/16/2014 revealed minimal iron stores with normal cytogenetics and no evidence of malignancy. He still reports some occasional dizziness and lightheadedness. He does not report any active bleeding at this time. He does not  report any hematochezia or melena. He is no longer reporting hematuria. He does not report any arthralgias or myalgias. He does report fatigue and tiredness and exertional dyspnea.     He does not report any fevers, chills, sweats does report weight loss and decline in his appetite. He does not report any chest pain, palpitation, orthopnea or PND. He does not report any leg edema, cough, shortness of breath, difficulty breathing but does report occasional dyspnea on exertion. He does not report any nausea, vomiting, constipation or diarrhea. He does not report any frequency urgency or hesitancy. He does report incontinence. He does not report any mood issues such as anxiety or depression. Rest of his review of systems unremarkable.   Medications: I have reviewed the patient's current medications.  Current Outpatient Prescriptions  Medication Sig Dispense Refill  . calcium citrate-vitamin D (CITRACAL+D) 315-200 MG-UNIT per tablet Take 1 tablet by mouth 2 (two) times daily.    . cephALEXin (KEFLEX) 500 MG capsule Take 1 capsule (500 mg total) by mouth 2 (two) times daily. 20 capsule 0  . Glucosamine-Chondroitin 1500-1200 MG/30ML LIQD Take 30 mLs by mouth daily.    . Inulin (FIBER CHOICE PO) Take 2 capsules by mouth daily.     Marland Kitchen levothyroxine (SYNTHROID, LEVOTHROID) 137 MCG tablet Take 137 mcg by mouth daily before breakfast.    . megestrol (MEGACE) 20 MG tablet Take 20 mg by mouth 2 (two) times daily.    . Melatonin 3 MG TABS Take 2 tablets by mouth at bedtime. Takes again @ 4 am as needed.    Marland Kitchen  Methylfol-Methylcob-Acetylcyst (CEREFOLIN NAC) 6-2-600 MG TABS Take one tablet by mouth one time daily 90 each 3  . Multiple Vitamin (MULTIVITAMIN WITH MINERALS) TABS tablet Take 1 tablet by mouth daily.    . nitrofurantoin, macrocrystal-monohydrate, (MACROBID) 100 MG capsule Take 100 mg by mouth 2 (two) times daily.  0  . Omega-3 Krill Oil 300 MG CAPS Take 300 mg by mouth daily.    . pramipexole (MIRAPEX)  0.125 MG tablet Take 1 tablet by mouth daily.  0  . propafenone (RYTHMOL) 225 MG tablet TAKE 1 TABLET (225 MG TOTAL) BY MOUTH 2 (TWO) TIMES DAILY. 60 tablet 6  . propranolol (INDERAL) 10 MG tablet Take 10 mg by mouth 3 (three) times daily.  6  . propranolol ER (INDERAL LA) 60 MG 24 hr capsule Take 60 mg by mouth daily at 2 am.    . rivaroxaban (XARELTO) 15 MG TABS tablet Take 1 tablet (15 mg total) by mouth daily with supper. (Patient taking differently: Take 15 mg by mouth at bedtime. At 10pm) 10 tablet 0  . solifenacin (VESICARE) 5 MG tablet Take 5 mg by mouth daily.    . tamsulosin (FLOMAX) 0.4 MG CAPS capsule Take 0.4 mg by mouth daily.    Marland Kitchen zolpidem (AMBIEN) 10 MG tablet Take 10 mg by mouth at bedtime as needed for sleep.   1   No current facility-administered medications for this visit.     Allergies: No Known Allergies  Past Medical History, Surgical history, Social history, and Family History were reviewed and updated.  Physical Exam: Blood pressure 121/60, pulse 63, temperature 97.6 F (36.4 C), temperature source Oral, resp. rate 18, height 6' (1.829 m), SpO2 100 %. ECOG: 1 General appearance: alert and cooperative. Not in any distress. Seated comfortably in wheelchair Head: Normocephalic, without obvious abnormality Neck: no adenopathy Lymph nodes: Cervical, supraclavicular, and axillary nodes normal. Heart:regular rate and rhythm, S1, S2 normal, no murmur, click, rub or gallop Lung:chest clear, no wheezing, rales, normal symmetric air entry Abdomin: soft, non-tender, without masses or organomegaly EXT:no erythema, induration, or nodules   Lab Results: Lab Results  Component Value Date   WBC 6.9 08/18/2014   HGB 9.8* 08/18/2014   HCT 29.5* 08/18/2014   MCV 85.2 08/18/2014   PLT 281 08/18/2014     Chemistry      Component Value Date/Time   NA 137 07/21/2014 2224   NA 137 01/27/2014 1426   K 3.8 07/21/2014 2224   K 4.2 01/27/2014 1426   CL 104 07/21/2014 2224    CO2 19* 07/21/2014 2211   CO2 21* 01/27/2014 1426   BUN 32* 07/21/2014 2224   BUN 35.5* 01/27/2014 1426   CREATININE 1.80* 07/21/2014 2224   CREATININE 1.7* 01/27/2014 1426      Component Value Date/Time   CALCIUM 8.9 07/21/2014 2211   CALCIUM 9.1 01/27/2014 1426   ALKPHOS 71 01/27/2014 1426   ALKPHOS 61 07/12/2013 0355   AST 15 01/27/2014 1426   AST 23 07/12/2013 0355   ALT 8 01/27/2014 1426   ALT 18 07/12/2013 0355   BILITOT 0.38 01/27/2014 1426   BILITOT 0.3 07/12/2013 0355     FINAL DIAGNOSIS Diagnosis Bone Marrow, Aspirate,Biopsy, and Clot, right iliac 06/16/2014.  - NORMOCELLULAR MARROW WITH TRILINEAGE HEMATOPOIESIS AND MATURATION. - MINIMAL IRON STORES. - NO METASTATIC MALIGNANCY. - SEE COMMENT. PERIPHERAL BLOOD  Impression and Plan:   81 year old gentleman with the following issues:  1. Multifactorial anemia. His anemia is normocytic and have been fluctuating  at least for the last 6 months. His erythropoietin is inappropriately elevated at this time. Bone marrow biopsy results discussed with the patient and his wife today. It did not show any evidence of dysplasia or infiltrative process. It did show minimal iron stores. Repeat iron studies in May 2016 continued to show low iron levels.  His hemoglobin today appears adequate and does not require packed results transfusion. We will resume his Aranesp 300 g given every 3 weeks.  2. Prostate cancer: He did develop locally advanced hormone sensitive prostate cancer and currently on Lupron. His PSA and a reasonable control under the care of Dr. Diona Fanti. At this time no further intervention is recommended. We will check PSA when he returns in 6 weeks.  3. Atrial fibrillation: He is currently in normal sinus rhythm but chronically anticoagulated on Xarelto which could be contributing to microscopic hematuria chronic blood loss.   4. Renal insufficiency: His creatinine today was 1.8.  Patient reviewed with Dr.  Alen Blew.  Carlton Adam, [place]  8/4/20163:03 PM

## 2014-08-19 ENCOUNTER — Telehealth: Payer: Self-pay | Admitting: Internal Medicine

## 2014-08-19 NOTE — Telephone Encounter (Signed)
New message      Pt c/o medication issue:  1. Name of Medication: propafenole  2. How are you currently taking this medication (dosage and times per day)? 150mg  bid 3. Are you having a reaction (difficulty breathing--STAT)? no  4. What is your medication issue? The order is for the pt to take medication bid, however wife says he is taking 225mg  bid.  They need an order for the correct dosage faxed [phone]

## 2014-08-19 NOTE — Telephone Encounter (Signed)
Informed that patient should be taking 225 mg BID. She states that patient and wife are trying to self dose him and were cutting pills in half. I explained that I spoke with their dtr on 7/11 to discuss this same issue and informed them then that they should not be cutting this in half. Supervisor at [employer] will make sure they are educated about dose and not cutting in half without physician approval.

## 2014-08-23 ENCOUNTER — Encounter: Payer: Self-pay | Admitting: *Deleted

## 2014-08-24 NOTE — Patient Instructions (Signed)
Continue Aranesp injections every 3 weeks Follow up in 6 weeks

## 2014-08-25 ENCOUNTER — Ambulatory Visit (HOSPITAL_COMMUNITY): Admission: RE | Admit: 2014-08-25 | Payer: PPO | Source: Ambulatory Visit

## 2014-08-25 ENCOUNTER — Ambulatory Visit (HOSPITAL_COMMUNITY)
Admission: RE | Admit: 2014-08-25 | Discharge: 2014-08-25 | Disposition: A | Discharge status: Home / Self Care | Payer: PPO | Source: Ambulatory Visit | Attending: Neurosurgery | Admitting: Neurosurgery

## 2014-08-25 DIAGNOSIS — G912 (Idiopathic) normal pressure hydrocephalus: Secondary | ICD-10-CM

## 2014-08-25 MED ORDER — DIAZEPAM 5 MG PO TABS: 10.0000 mg | ORAL_TABLET | Freq: Once | ORAL | Status: DC

## 2014-08-25 MED ORDER — IOHEXOL 180 MG/ML  SOLN: 20.0000 mL | Freq: Once | INTRAMUSCULAR | Status: DC | PRN

## 2014-08-29 ENCOUNTER — Telehealth: Payer: Self-pay | Admitting: *Deleted

## 2014-08-29 ENCOUNTER — Ambulatory Visit: Payer: Medicare Other | Admitting: Neurology

## 2014-08-29 NOTE — Telephone Encounter (Signed)
Per spouse, pt has TTM equipment. Pt always sent TTMs on his own initiative. Spouse states pt now permanently in nursing home. He can no longer walk either. Spouse concerned she does not know how to use TTM equipment. I let her know reports may be sent from anywhere; they are not limited to personal residence. Mrs. Pettway will take equipment to [facility] for future checks. Mrs. Alcoser also aware, if TTMs can no longer work from [facility], pt can still come to [facility] for device follow up every six months.   Mrs. Cockerell aware appt w/ device clinic 09/12/14 @3 :00p.m.

## 2014-08-30 ENCOUNTER — Other Ambulatory Visit (HOSPITAL_COMMUNITY): Payer: Self-pay | Admitting: Neurosurgery

## 2014-08-30 ENCOUNTER — Other Ambulatory Visit: Payer: Self-pay | Admitting: Neurosurgery

## 2014-08-30 DIAGNOSIS — G912 (Idiopathic) normal pressure hydrocephalus: Secondary | ICD-10-CM

## 2014-09-01 ENCOUNTER — Other Ambulatory Visit: Payer: Self-pay | Admitting: Urology

## 2014-09-02 ENCOUNTER — Encounter: Payer: Self-pay | Admitting: Internal Medicine

## 2014-09-02 ENCOUNTER — Telehealth: Payer: Self-pay

## 2014-09-02 NOTE — Telephone Encounter (Signed)
Pt requested samples of xarelto 15 mg. Left one months supply for him upfront

## 2014-09-02 NOTE — Telephone Encounter (Deleted)
ERROR

## 2014-09-06 ENCOUNTER — Telehealth: Payer: Self-pay

## 2014-09-06 NOTE — Telephone Encounter (Signed)
wife called wanting to let Dr. Caryl Comes know that Dr. Felipa Eth changed the way inderal is taken. Her husband has been taking the way dr Felipa Eth prescribed it. Now pt wife would like a call back from nurse to let her know if Dr. Caryl Comes is ok with the new dosage of inderal. Phone number listed in chart is correct call back number to reach wife.

## 2014-09-07 ENCOUNTER — Ambulatory Visit (HOSPITAL_COMMUNITY)
Admission: RE | Admit: 2014-09-07 | Discharge: 2014-09-07 | Disposition: A | Discharge status: Home / Self Care | Payer: PPO | Source: Ambulatory Visit | Attending: Neurosurgery | Admitting: Neurosurgery

## 2014-09-07 ENCOUNTER — Ambulatory Visit (HOSPITAL_COMMUNITY): Payer: PPO

## 2014-09-07 DIAGNOSIS — G912 (Idiopathic) normal pressure hydrocephalus: Secondary | ICD-10-CM | POA: Diagnosis present

## 2014-09-07 LAB — CSF CELL COUNT WITH DIFFERENTIAL
RBC COUNT CSF: 35 /mm3 — AB
TUBE #: 1
WBC, CSF: 1 /mm3 (ref 0–5)

## 2014-09-07 LAB — PROTEIN, CSF: TOTAL PROTEIN, CSF: 70 mg/dL — AB (ref 15–45)

## 2014-09-07 LAB — GLUCOSE, CSF: GLUCOSE CSF: 43 mg/dL (ref 40–70)

## 2014-09-07 MED ORDER — DIAZEPAM 5 MG PO TABS
10.0000 mg | ORAL_TABLET | Freq: Once | ORAL | Status: AC
  Administered 2014-09-07: 10 mg via ORAL
  Filled 2014-09-07: qty 2

## 2014-09-07 MED ORDER — IOHEXOL 180 MG/ML  SOLN
20.0000 mL | Freq: Once | INTRAMUSCULAR | Status: DC | PRN
  Administered 2014-09-07: 20 mL via INTRATHECAL
  Filled 2014-09-07: qty 20

## 2014-09-07 MED ORDER — DIAZEPAM 5 MG PO TABS
ORAL_TABLET | ORAL | Status: AC
  Filled 2014-09-07: qty 2

## 2014-09-07 MED ORDER — ONDANSETRON HCL 4 MG/2ML IJ SOLN: 4.0000 mg | Freq: Four times a day (QID) | INTRAMUSCULAR | Status: DC | PRN

## 2014-09-07 MED ORDER — LIDOCAINE HCL (PF) 1 % IJ SOLN
INTRAMUSCULAR | Status: AC
  Filled 2014-09-07: qty 5

## 2014-09-07 NOTE — Discharge Instructions (Signed)
Pt was given hand out/ see copy

## 2014-09-07 NOTE — Op Note (Signed)
*   No surgery found * Lumbar Myelogram, spinal tap  PATIENT:  [name] is a 81 y.o. male with a question of normal pressure hydrocephalus, and/or lumbar stenosis causing gait difficulties. He is here today for a lumbar puncture to remove spinal fluid, and to record opening and closing pressures. He will also undergo a lumbar myelogram and post myelogram CT of the lumbar spine.   PRE-OPERATIVE DIAGNOSIS:  Lumbar stenosis, NPH  POST-OPERATIVE DIAGNOSIS:  Lumbar stenosis, NPH  PROCEDURE:  Lumbar Myelogram  SURGEON:  Adelyn Roscher  ANESTHESIA:   local LOCAL MEDICATIONS USED:  LIDOCAINE  and Amount: 8 ml Procedure Note: [name] is a 81 y.o. male Was taken to the fluoroscopy suite and  positioned prone on the fluoroscopy table. HIs back was prepared and draped in a sterile manner. I infiltrated 8 cc into the lumbar region. I then introduced a spinal needle into the thecal sac at the L4/5 interlaminar space. We rolled [name] on his side to measure his opening pressure which was 10cm H2O. I removed approximately 15cc spinal fluid. The spinal fluid drained very slowly. Closing pressure was 7cm H2O.  I infiltrated 20cc of Omnipaque 180 into the thecal sac. Fluoroscopy showed the needle and contrast in the thecal sac. [name] tolerated the procedure well. he Will be taken to CT for evaluation.     PATIENT DISPOSITION:  PACU - hemodynamically stable.

## 2014-09-08 ENCOUNTER — Ambulatory Visit (HOSPITAL_BASED_OUTPATIENT_CLINIC_OR_DEPARTMENT_OTHER): Payer: PPO

## 2014-09-08 ENCOUNTER — Other Ambulatory Visit (HOSPITAL_BASED_OUTPATIENT_CLINIC_OR_DEPARTMENT_OTHER): Payer: PPO

## 2014-09-08 VITALS — BP 104/59 | HR 60 | Temp 97.9°F

## 2014-09-08 DIAGNOSIS — D631 Anemia in chronic kidney disease: Secondary | ICD-10-CM

## 2014-09-08 DIAGNOSIS — D509 Iron deficiency anemia, unspecified: Secondary | ICD-10-CM

## 2014-09-08 DIAGNOSIS — N189 Chronic kidney disease, unspecified: Secondary | ICD-10-CM | POA: Diagnosis not present

## 2014-09-08 LAB — CBC WITH DIFFERENTIAL/PLATELET
BASO%: 0.1 % (ref 0.0–2.0)
BASOS ABS: 0 10*3/uL (ref 0.0–0.1)
EOS ABS: 0.2 10*3/uL (ref 0.0–0.5)
EOS%: 3.7 % (ref 0.0–7.0)
HEMATOCRIT: 32.1 % — AB (ref 38.4–49.9)
HGB: 10.6 g/dL — ABNORMAL LOW (ref 13.0–17.1)
LYMPH#: 1 10*3/uL (ref 0.9–3.3)
LYMPH%: 19.4 % (ref 14.0–49.0)
MCH: 29.1 pg (ref 27.2–33.4)
MCHC: 32.9 g/dL (ref 32.0–36.0)
MCV: 88.2 fL (ref 79.3–98.0)
MONO#: 0.3 10*3/uL (ref 0.1–0.9)
MONO%: 4.8 % (ref 0.0–14.0)
NEUT#: 3.9 10*3/uL (ref 1.5–6.5)
NEUT%: 72 % (ref 39.0–75.0)
PLATELETS: 346 10*3/uL (ref 140–400)
RBC: 3.63 10*6/uL — ABNORMAL LOW (ref 4.20–5.82)
RDW: 23.9 % — ABNORMAL HIGH (ref 11.0–14.6)
WBC: 5.4 10*3/uL (ref 4.0–10.3)

## 2014-09-08 LAB — HOLD TUBE, BLOOD BANK

## 2014-09-08 MED ORDER — DARBEPOETIN ALFA 300 MCG/0.6ML IJ SOSY
300.0000 ug | PREFILLED_SYRINGE | Freq: Once | INTRAMUSCULAR | Status: AC
  Administered 2014-09-08: 300 ug via SUBCUTANEOUS
  Filled 2014-09-08: qty 0.6

## 2014-09-13 NOTE — Telephone Encounter (Signed)
lmtcb

## 2014-09-13 NOTE — Telephone Encounter (Signed)
Follow up     Patient wife returning call from nurse

## 2014-09-14 NOTE — Telephone Encounter (Signed)
Spoke with Stoneking's office who is going to fax office notes from June and July  to review.

## 2014-09-14 NOTE — Telephone Encounter (Signed)
This encounter was created in error - please disregard.

## 2014-09-14 NOTE — Telephone Encounter (Signed)
Patient's wife informed me that Dr. Felipa Eth changed his Inderal to 10 mg TID.  She wants to know Dr. Olin Pia opinion about this change and if he agrees. Informed that I would reach out to [place] office to obtain OV to review reasoning behind this change and then review with Dr. Caryl Comes. She is aware it may be next week before calling her back depending on how long it takes to obtain records from [employer] [facility]. She verbalized understanding and agreeable to plan.

## 2014-09-16 ENCOUNTER — Encounter: Payer: Self-pay | Admitting: Internal Medicine

## 2014-09-16 DIAGNOSIS — R001 Bradycardia, unspecified: Secondary | ICD-10-CM | POA: Diagnosis not present

## 2014-09-26 ENCOUNTER — Ambulatory Visit (INDEPENDENT_AMBULATORY_CARE_PROVIDER_SITE_OTHER): Payer: PPO | Admitting: Neurology

## 2014-09-26 ENCOUNTER — Encounter: Payer: Self-pay | Admitting: Neurology

## 2014-09-26 VITALS — BP 95/62 | HR 55 | Ht 72.0 in

## 2014-09-26 DIAGNOSIS — R269 Unspecified abnormalities of gait and mobility: Secondary | ICD-10-CM

## 2014-09-26 DIAGNOSIS — G3184 Mild cognitive impairment, so stated: Secondary | ICD-10-CM | POA: Diagnosis not present

## 2014-09-26 MED ORDER — DONEPEZIL HCL 5 MG PO TABS: 5.0000 mg | ORAL_TABLET | Freq: Every day | ORAL | Status: DC

## 2014-09-26 NOTE — Progress Notes (Signed)
[facility] [address] [place]. [place] [id number] [phone]       OFFICE FOLLOW UP VISIT NOTE  Barry. [name] Date of Birth:  03-May-1931 Medical Record Number:  [id number]   Referring MD: Lajean Manes  Reason for Referral:   Falls and leg weakness  HPI: [name] is a 86 year Caucasian male who is accompanied today by his wife who provides most of the history as well as a letter written by his daughter regarding his neurological difficulties. Patient has had gradually progressive gait difficulties starting from 2013 when he was noted as shuffling and having multiple falls and he was using a walker. In summer of 2015 he could walk with a walker but during the winter he became completely dependent on it. In spring of 2016 there is rapid decline and he had 3 falls in the month of March as well as in June. Subsequently lost his ability to ambulate and stand even with a walker and on June 30 he had sudden onset of leg weakness on the right along with inability to even stand with support and required help to transfer from the bed to the chair as well as to the toilet. He had extreme fear of an anxiety of falling and he has not been walking since then. He spends most of the time in the wheelchair. The patient denies any headache and he neck pain, back pain, radicular pain in his extremities. He seems fairly strong when sitting in a chair but this cannot get up and hold his balance. He had previous history of subdural hematoma in 2009 requiring left parietal craniotomy by Dr. Cyndy Freeze. He also has atrial fibrillation and is on Xarelto. He denies any prior history of strokes. He was seen by me in 2013 for benign essential tremor as well as mild cognitive impairment and was started on Inderal which helped his tremors as well as Cerefolin for his cognitive impairment both of which appear to be stable. He also has sleep apnea and supposed to wear his CPAP mask but admits that he has  not been using it regularly. He has prostate cancer which has been fairly stable as per Dr. Diona Fanti his urologist. He also sees Dr. Alen Blew hematologist for his chronic multifactorial mild anemia. Update 09/26/2014 : He returns for follow-up after his last visit with me 2 months ago. Is accompanied by his wife states that patient continues to have problems with standing and getting up and walking. He is currently getting a home physical and occupational therapy and is able to walk with a walker with one person assist. He has good days and bad days. He underwent at my request CT scan of the spines on 07/28/14 which showed severe degenerative disc disease and facet disease at C5-6 with bilateral foraminal stenosis right greater than left. CT scan of the thoracic spine showed a small focal right paracentral and medial foraminal disc protrusion at T9-10. CT scan of the lumbar spine also shows diffuse disc bulging and spondylytic changes with broad-based foraminal disc protrusion into the right at L2-3. He subsequently.saw Dr. Cyndy Freeze who ordered a CT myelogram done on 09/07/14 which showed no significant spinal stenosis or root impingement. Moderate spinal canal and bilateral subarticular recess stenosis at L4-5. Patient was told he does not have a surgically treatable spinal stenosis. Patient had CT scan of the head which showed mild generalized cerebral atrophy which had progressed since previous CT scan from 2012. Patient admits to mild memory  and cognitive difficulties and on Mini-Mental status exam today score 19/30 which is a decline from 24/30 at last visit. He does admit to having some numbness in his feet but he has not been evaluated for neuropathy. The patient's wife appears quite frustrated without a definite diagnosis as to why patient can't walk. I spent a lot of time explained to her the results of the tests, my differential diagnosis and his balance problem is likely multifactorial due to combination of  several things. ROS:   14 system review of systems is positive for  gait difficulty, frequent falls, leg weakness, hand tremors, memory loss,   urination problems, incontinence, blood in urine,   easy bruising and bleeding, anemia, blurred vision and all other systems negative  PMH:  Past Medical History  Diagnosis Date  . Orthostatic lightheadedness   . History of subdural hematoma     LEFT CHRONIC SUBDURAL HEMATOMA   S/P PARIETAL CRANIOTOMY WITH EVACUATION HEMATOMA  . Atrial fibrillation, persistent   . H/O cardiac radiofrequency ablation     A-FLUTTER  . Cardiac pacemaker last check 08-30-2013    05-05-2007  DD  MEDTRONIC GUIDANT INSIGNIA  . RBBB   . Benign essential tremor   . Diverticulosis of colon   . Tachy-brady syndrome   . SSS (sick sinus syndrome)   . Hydronephrosis, bilateral   . Renal insufficiency   . Recurrent prostate carcinoma     2005  S/P RADIOACTIVE SEED IMPLANTS/   BLADDER NECK MASS RESECTED 05/ 2015  SHOWED RECURRENT PROSTATE CANCER    Social History:  Social History   Social History  . Marital Status: Married    Spouse Name: margaret  . Number of Children: 2  . Years of Education: BA   Occupational History  . retired    Social History Main Topics  . Smoking status: Never Smoker   . Smokeless tobacco: Never Used  . Alcohol Use: 12.6 oz/week    7 Glasses of wine, 7 Standard drinks or equivalent, 7 Shots of liquor per week     Comment: 2 glasses wine day  . Drug Use: No  . Sexual Activity: Not on file   Other Topics Concern  . Not on file   Social History Narrative   Patient lives at home with his spouse.   Caffeine Use: 2 cups of coffee    Medications:   Current Outpatient Prescriptions on File Prior to Visit  Medication Sig Dispense Refill  . calcium citrate-vitamin D (CITRACAL+D) 315-200 MG-UNIT per tablet Take 1 tablet by mouth 2 (two) times daily.    Marland Kitchen glucosamine-chondroitin 500-400 MG tablet Take 1 tablet by mouth 3 (three) times  daily.    Marland Kitchen levothyroxine (SYNTHROID, LEVOTHROID) 137 MCG tablet Take 137 mcg by mouth daily before breakfast.    . megestrol (MEGACE) 20 MG tablet Take 20 mg by mouth daily.     . Multiple Vitamin (MULTIVITAMIN WITH MINERALS) TABS tablet Take 1 tablet by mouth daily.    Marland Kitchen PRESCRIPTION MEDICATION Supportive therapy    [facility]    . propafenone (RYTHMOL) 225 MG tablet TAKE 1 TABLET (225 MG TOTAL) BY MOUTH 2 (TWO) TIMES DAILY. 60 tablet 6  . propranolol (INDERAL) 10 MG tablet Take 10 mg by mouth 2 (two) times daily.   6  . rivaroxaban (XARELTO) 15 MG TABS tablet Take 1 tablet (15 mg total) by mouth daily with supper. (Patient taking differently: Take 15 mg by mouth at bedtime. At 10pm) 10 tablet 0  .  zolpidem (AMBIEN) 10 MG tablet Take 10 mg by mouth at bedtime as needed for sleep.   1   No current facility-administered medications on file prior to visit.    Allergies:  No Known Allergies  Physical Exam General: Frail elderly Caucasian male, seated, in no evident distress. Left parietal craniotomy defect from prior subdural hematoma evacuation surgery Head: head normocephalic and atraumatic.   Neck: supple with no carotid or supraclavicular bruits Cardiovascular: regular rate and rhythm, no murmurs Musculoskeletal: no deformity Skin:  no rash/petichiae. Has pacemaker left infraclavicular. Vascular:  Normal pulses all extremities  Neurologic Exam Mental Status: Awake and fully alert. Oriented to place and time. Recent and remote memory intact. Attention span, concentration and fund of knowledge mildly diminished.. Mood and affect appropriate. Mini-Mental status exam scored 19/30 with deficits in orientation, attention, calculation. Animal naming test 5. Clock drawing 1/4. Glabellar tap and palmomental reflexes are absent Cranial Nerves: Fundoscopic exam reveals sharp disc margins. Pupils equal, briskly reactive to light. Extraocular movements full without nystagmus. Visual fields full to  confrontation. Hearing intact. Facial sensation intact. Face, tongue, palate moves normally and symmetrically.  Motor: Mild weakness of right lower extremity with hip flexors, knee extensors and ankle dorsiflexors 4/5. Tone is normal bilaterally. No resting tremor or cogwheel rigidity. Mild fine action tremor of outstretched upper extremities. Sensory.: Diminished touch , pinprick , position and vibratory sensation. In right lower extremity from hip down  Coordination: Rapid alternating movements normal in all extremities. Finger-to-nose and heel-to-shin performed accurately bilaterally. Gait and Station: Unable to stand up even with one person assist and tends to fall backwards and hence gait was not tested. Reflexes: 2+ and asymmetric brisker on the right. Toes downgoing.       ASSESSMENT: 8 year Caucasian male with a subacute decline in gait and balance with sudden worsening 1 week ago with inability to stand of unclear etiology. Etiology likely multifactorial due to combination of damage from prior subdural hemorrhage, cerebrovascular disease, mild dementia and underlying peripheral neuropathy and even possibly paraneoplastic related to prostate cancer. Long-standing history of benign essential tremor  which appears stable    PLAN:  I had a long discussion with the patient and his wife with regards to his difficulty with walking and discussed and personally reviewed imaging studies and answered questions. I recommend checking no conduction EMG study to check for underlying neuropathy as well as trial of Aricept to help with his gait apraxia and cognitive impairment. Continue home physical and occupational therapy. Greater than 50% of time during this 30 minute visit was spent on counseling and coordination of care. Return for follow-up in 2 months or call earlier if necessary.  Antony Contras, MD Note: This document was prepared with digital dictation and possible smart phrase technology. Any  transcriptional errors that result from this process are unintentional.

## 2014-09-26 NOTE — Patient Instructions (Signed)
I had a long discussion with the patient and his wife with regards to his difficulty with walking and discussed and personally reviewed imaging studies and answered questions. I recommend checking no conduction EMG study to check for underlying neuropathy as well as trial of Aricept to help with his gait apraxia and cognitive impairment. Continue home physical and occupational therapy. Greater than 50% of time during this 30 minute visit was spent on counseling and coordination of care. Return for follow-up in 2 months or call earlier if necessary.

## 2014-09-28 ENCOUNTER — Telehealth: Payer: Self-pay | Admitting: Neurology

## 2014-09-28 NOTE — Telephone Encounter (Signed)
Patients wife called regarding donepezil (ARICEPT) 5 MG tablet. She said directions are to be given at bedtime with food. She is confused about giving food at bedtime. Please call and advise. She can be reached at [phone] and can leave VM if she doesn't answer.

## 2014-09-28 NOTE — Patient Instructions (Signed)
KNOWLEDGE ESCANDON  09/28/2014   Your procedure is scheduled on: Monday 10/10/2014  Report to [facility] Main  Entrance take [place]  elevators to 3rd floor to  [place] at  0800 AM.  Call this number if you have problems the morning of surgery [phone]   Remember: ONLY 1 PERSON MAY GO WITH YOU TO SHORT STAY TO GET  READY MORNING OF [place].  Do not eat food or drink liquids :After Midnight.     Take these medicines the morning of surgery with A SIP OF WATER: Levothyroxine, Propanolol, Rythmol                               You may not have any metal on your body including hair pins and              piercings  Do not wear jewelry, make-up, lotions, powders or perfumes, deodorant             Do not wear nail polish.  Do not shave  48 hours prior to surgery.              Men may shave face and neck.   Do not bring valuables to the hospital. [place].  Contacts, dentures or bridgework may not be worn into surgery.  Leave suitcase in the car. After surgery it may be brought to your room.     Patients discharged the day of surgery will not be allowed to drive home.  Name and phone number of your driver:  Special Instructions: N/A              Please read over the following fact sheets you were given: _____________________________________________________________________             [facility] - Preparing for Surgery Before surgery, you can play an important role.  Because skin is not sterile, your skin needs to be as free of germs as possible.  You can reduce the number of germs on your skin by washing with CHG (chlorahexidine gluconate) soap before surgery.  CHG is an antiseptic cleaner which kills germs and bonds with the skin to continue killing germs even after washing. Please DO NOT use if you have an allergy to CHG or antibacterial soaps.  If your skin becomes reddened/irritated stop using the  CHG and inform your nurse when you arrive at Short Stay. Do not shave (including legs and underarms) for at least 48 hours prior to the first CHG shower.  You may shave your face/neck. Please follow these instructions carefully:  1.  Shower with CHG Soap the night before surgery and the  morning of Surgery.  2.  If you choose to wash your hair, wash your hair first as usual with your  normal  shampoo.  3.  After you shampoo, rinse your hair and body thoroughly to remove the  shampoo.                           4.  Use CHG as you would any other liquid soap.  You can apply chg directly  to the skin and wash  Gently with a scrungie or clean washcloth.  5.  Apply the CHG Soap to your body ONLY FROM THE NECK DOWN.   Do not use on face/ open                           Wound or open sores. Avoid contact with eyes, ears mouth and genitals (private parts).                       Wash face,  Genitals (private parts) with your normal soap.             6.  Wash thoroughly, paying special attention to the area where your surgery  will be performed.  7.  Thoroughly rinse your body with warm water from the neck down.  8.  DO NOT shower/wash with your normal soap after using and rinsing off  the CHG Soap.                9.  Pat yourself dry with a clean towel.            10.  Wear clean pajamas.            11.  Place clean sheets on your bed the night of your first shower and do not  sleep with pets. Day of Surgery : Do not apply any lotions/deodorants the morning of surgery.  Please wear clean clothes to the hospital/surgery center.  FAILURE TO FOLLOW THESE INSTRUCTIONS MAY RESULT IN THE CANCELLATION OF YOUR SURGERY PATIENT SIGNATURE_________________________________  NURSE SIGNATURE__________________________________  ________________________________________________________________________ [facility] - Preparing for Surgery Before surgery, you can play an important role.  Because skin  is not sterile, your skin needs to be as free of germs as possible.  You can reduce the number of germs on your skin by washing with CHG (chlorahexidine gluconate) soap before surgery.  CHG is an antiseptic cleaner which kills germs and bonds with the skin to continue killing germs even after washing. Please DO NOT use if you have an allergy to CHG or antibacterial soaps.  If your skin becomes reddened/irritated stop using the CHG and inform your nurse when you arrive at Short Stay. Do not shave (including legs and underarms) for at least 48 hours prior to the first CHG shower.  You may shave your face/neck. Please follow these instructions carefully:  1.  Shower with CHG Soap the night before surgery and the  morning of Surgery.  2.  If you choose to wash your hair, wash your hair first as usual with your  normal  shampoo.  3.  After you shampoo, rinse your hair and body thoroughly to remove the  shampoo.                           4.  Use CHG as you would any other liquid soap.  You can apply chg directly  to the skin and wash                       Gently with a scrungie or clean washcloth.  5.  Apply the CHG Soap to your body ONLY FROM THE NECK DOWN.   Do not use on face/ open                           Wound or  open sores. Avoid contact with eyes, ears mouth and genitals (private parts).                       Wash face,  Genitals (private parts) with your normal soap.             6.  Wash thoroughly, paying special attention to the area where your surgery  will be performed.  7.  Thoroughly rinse your body with warm water from the neck down.  8.  DO NOT shower/wash with your normal soap after using and rinsing off  the CHG Soap.                9.  Pat yourself dry with a clean towel.            10.  Wear clean pajamas.            11.  Place clean sheets on your bed the night of your first shower and do not  sleep with pets. Day of Surgery : Do not apply any lotions/deodorants the morning of  surgery.  Please wear clean clothes to the hospital/surgery center.  FAILURE TO FOLLOW THESE INSTRUCTIONS MAY RESULT IN THE CANCELLATION OF YOUR SURGERY PATIENT SIGNATURE_________________________________  NURSE SIGNATURE__________________________________  ________________________________________________________________________

## 2014-09-29 ENCOUNTER — Ambulatory Visit (HOSPITAL_BASED_OUTPATIENT_CLINIC_OR_DEPARTMENT_OTHER): Payer: PPO

## 2014-09-29 ENCOUNTER — Other Ambulatory Visit (HOSPITAL_BASED_OUTPATIENT_CLINIC_OR_DEPARTMENT_OTHER): Payer: PPO

## 2014-09-29 ENCOUNTER — Ambulatory Visit (HOSPITAL_BASED_OUTPATIENT_CLINIC_OR_DEPARTMENT_OTHER): Payer: PPO | Admitting: Oncology

## 2014-09-29 VITALS — BP 124/64 | HR 59 | Temp 97.6°F | Resp 16 | Ht 72.0 in

## 2014-09-29 DIAGNOSIS — N179 Acute kidney failure, unspecified: Secondary | ICD-10-CM

## 2014-09-29 DIAGNOSIS — N189 Chronic kidney disease, unspecified: Secondary | ICD-10-CM | POA: Diagnosis not present

## 2014-09-29 DIAGNOSIS — D509 Iron deficiency anemia, unspecified: Secondary | ICD-10-CM

## 2014-09-29 DIAGNOSIS — C61 Malignant neoplasm of prostate: Secondary | ICD-10-CM

## 2014-09-29 DIAGNOSIS — D631 Anemia in chronic kidney disease: Secondary | ICD-10-CM

## 2014-09-29 LAB — CBC WITH DIFFERENTIAL/PLATELET
BASO%: 0.2 % (ref 0.0–2.0)
Basophils Absolute: 0 10*3/uL (ref 0.0–0.1)
EOS%: 2.1 % (ref 0.0–7.0)
Eosinophils Absolute: 0.2 10*3/uL (ref 0.0–0.5)
HEMATOCRIT: 33.9 % — AB (ref 38.4–49.9)
HGB: 10.9 g/dL — ABNORMAL LOW (ref 13.0–17.1)
LYMPH#: 1.5 10*3/uL (ref 0.9–3.3)
LYMPH%: 16.6 % (ref 14.0–49.0)
MCH: 29.5 pg (ref 27.2–33.4)
MCHC: 32.2 g/dL (ref 32.0–36.0)
MCV: 91.9 fL (ref 79.3–98.0)
MONO#: 0.6 10*3/uL (ref 0.1–0.9)
MONO%: 6.3 % (ref 0.0–14.0)
NEUT%: 74.8 % (ref 39.0–75.0)
NEUTROS ABS: 6.6 10*3/uL — AB (ref 1.5–6.5)
PLATELETS: 302 10*3/uL (ref 140–400)
RBC: 3.69 10*6/uL — AB (ref 4.20–5.82)
RDW: 17.7 % — ABNORMAL HIGH (ref 11.0–14.6)
WBC: 8.8 10*3/uL (ref 4.0–10.3)

## 2014-09-29 LAB — HOLD TUBE, BLOOD BANK

## 2014-09-29 MED ORDER — DARBEPOETIN ALFA 300 MCG/0.6ML IJ SOSY
300.0000 ug | PREFILLED_SYRINGE | Freq: Once | INTRAMUSCULAR | Status: AC
  Administered 2014-09-29: 300 ug via SUBCUTANEOUS
  Filled 2014-09-29: qty 0.6

## 2014-09-29 NOTE — Progress Notes (Signed)
Hematology and Oncology Follow Up Visit  [name] [id number] 27-Dec-1931 81 y.o. 09/29/2014 12:14 PM [name], MDStoneking, Hal, MD   Principle Diagnosis: 81 year old gentleman with: 1. multifactorial anemia he has an element of anemia of chronic disease, renal insufficiency and iron deficiency. He could have an element of myelodysplasia.  2.Prostate cancer diagnosed in 2005, Gleason score 3+3 = 6 with a PSA of 4.4. He is developing castration resistant disease.  3. Weakness, gait disturbance and inability to ambulate. Follows up with neurology with multifactorial etiology.  Prior Therapy:  1. He was treated with brachytherapy and seed implants and 12/21/2003. His PSA did rise up to 2.89 and found to have local recurrence of his prostate cancer into the bladder neck. He underwent a double J stent placement and his staging workup did not show any widespread metastasis. He was treated with androgen deprivation with [name] and his PSA dropped down to 0.93. He is currently receiving Lupron every 4 months under the care of Dr. Diona [name]. His most recent PSA is up to 3.62 in August 2016.  He is status post bone marrow biopsy done on 06/16/2014 that showed no evidence of malignancy and scarce iron stores.   Current therapy: Aranesp 300 g every 3 weeks to keep his hemoglobin above 11. He also will receive IV iron as well as packed red cell transfusion as needed.  He is under consideration for second line hormonal therapy.  Interim History:  [name] presents today for a follow-up visit. Since the last visit, he continued to decline clinically. He is unable to walk or stand due to weakness in his lower extremities. The etiology of this weakness is unclear and currently followed by neurology. 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 He is at home at this time with his wife and daughter.  He still reports some occasional dizziness and lightheadedness. He  does not report any active bleeding at this time. He does not report any hematochezia or melena. He does report persistent hematuria with a Foley catheter in place. He does report fatigue and tiredness and exertional dyspnea.     He does not report any fevers, chills, sweats but does report weight loss and decline in his appetite. He does not report any chest pain, palpitation, orthopnea or PND. He does not report any leg edema, cough, shortness of breath, difficulty breathing but does report occasional dyspnea on exertion. He does not report any nausea, vomiting, constipation or diarrhea. He does not report any frequency urgency or hesitancy. He does not report any mood issues such as anxiety or depression. Rest of his review of systems unremarkable.   Medications: I have reviewed the patient's current medications.  Current Outpatient Prescriptions  Medication Sig Dispense Refill  . calcium citrate-vitamin D (CITRACAL+D) 315-200 MG-UNIT per tablet Take 1 tablet by mouth 2 (two) times daily.    Marland Kitchen donepezil (ARICEPT) 5 MG tablet Take 1 tablet (5 mg total) by mouth at bedtime. Start one tablet with food daily x 1 month then two tablets daily 30 tablet 0  . glucosamine-chondroitin 500-400 MG tablet Take 1 tablet by mouth 3 (three) times daily.    Marland Kitchen levothyroxine (SYNTHROID, LEVOTHROID) 137 MCG tablet Take 137 mcg by mouth daily before breakfast.    . megestrol (MEGACE) 20 MG tablet Take 20 mg by mouth daily.     . Multiple Vitamin (MULTIVITAMIN WITH MINERALS) TABS tablet Take 1 tablet by mouth daily.    . pramipexole (  MIRAPEX) 0.125 MG tablet 1 TABLET BEFORE BEDTIME ONCE A DAY ORALLY 30  0  . PRESCRIPTION MEDICATION Supportive therapy    [facility]    . propafenone (RYTHMOL) 225 MG tablet TAKE 1 TABLET (225 MG TOTAL) BY MOUTH 2 (TWO) TIMES DAILY. 60 tablet 6  . propranolol (INDERAL) 10 MG tablet Take 10 mg by mouth 2 (two) times daily.   6  . rivaroxaban (XARELTO) 15 MG TABS tablet Take 1 tablet (15 mg  total) by mouth daily with supper. (Patient taking differently: Take 15 mg by mouth at bedtime. At 10pm) 10 tablet 0  . zolpidem (AMBIEN) 10 MG tablet Take 10 mg by mouth at bedtime as needed for sleep.   1   No current facility-administered medications for this visit.     Allergies: No Known Allergies  Past Medical History, Surgical history, Social history, and Family History were reviewed and updated.  Physical Exam: Blood pressure 124/64, pulse 59, temperature 97.6 F (36.4 C), temperature source Axillary, resp. rate 16, height 6' (1.829 m), SpO2 100 %. ECOG: 1 General appearance: alert and cooperative. Chronically ill-appearing in a wheelchair. Head: Normocephalic, without obvious abnormality Neck: no adenopathy Lymph nodes: Cervical, supraclavicular, and axillary nodes normal. Heart:regular rate and rhythm, S1, S2 normal, no murmur, click, rub or gallop Lung:chest clear, no wheezing, rales, normal symmetric air entry Abdomin: soft, non-tender, without masses or organomegaly without any shifting dullness. EXT:no erythema, induration, or nodules no edema.   Lab Results: Lab Results  Component Value Date   WBC 8.8 09/29/2014   HGB 10.9* 09/29/2014   HCT 33.9* 09/29/2014   MCV 91.9 09/29/2014   PLT 302 09/29/2014     Chemistry      Component Value Date/Time   NA 137 07/21/2014 2224   NA 137 01/27/2014 1426   K 3.8 07/21/2014 2224   K 4.2 01/27/2014 1426   CL 104 07/21/2014 2224   CO2 19* 07/21/2014 2211   CO2 21* 01/27/2014 1426   BUN 32* 07/21/2014 2224   BUN 35.5* 01/27/2014 1426   CREATININE 1.80* 07/21/2014 2224   CREATININE 1.7* 01/27/2014 1426      Component Value Date/Time   CALCIUM 8.9 07/21/2014 2211   CALCIUM 9.1 01/27/2014 1426   ALKPHOS 71 01/27/2014 1426   ALKPHOS 61 07/12/2013 0355   AST 15 01/27/2014 1426   AST 23 07/12/2013 0355   ALT 8 01/27/2014 1426   ALT 18 07/12/2013 0355   BILITOT 0.38 01/27/2014 1426   BILITOT 0.3 07/12/2013 0355      F   INAL DResults for [name], [name] (MRN [id number]) as of 09/29/2014 11:49  Ref. Range 05/29/2013 20:05  PSA Latest Ref Range: <=4.00 ng/mL 2.59  IAGNOSIS  PSA  3.62 08/2014.    Impression and Plan:   81 year old gentleman with the following issues:  1. Multifactorial anemia. His anemia is normocytic and have been fluctuating at least for the last 6 months. His erythropoietin is inappropriately elevated at this time. Bone marrow biopsy did not show any evidence of myelodysplasia or leukemia.  His hemoglobin today appears adequate and does not require packed results transfusion. He will not receive Aranesp today but will continue every 3 weeks to keep his hemoglobin above 11.   2. Prostate cancer: He did develop locally advanced hormone sensitive prostate cancer and currently on Lupron. His PSA slowly climbing up with his last PSA in August 2016 is up to 3.6 to from 2.59 in May 2015. Ideally he will benefit  from second line hormonal therapy with Zytiga on [place]. This can be started any time but I prefer to hold off for the time being to his other issue stabilize or resolve. I do not think his neurological symptoms as related to his prostate cancer.  3. Atrial fibrillation: He is currently in normal sinus rhythm but chronically anticoagulated on Xarelto which could be contributing to havehematuria chronic blood loss.   4. Renal insufficiency: His creatinine today was 1.8.  5. Multifactorial gait disturbance and weakness: He is currently under evaluation by neurology. No clear-cut etiology has been identified. I doubt this is a paraneoplastic manifestation of his prostate cancer.  6. Hematuria: He is scheduled for cystoscopy on 10/10/2014. This could certainly be a due to cancer progression locally. We will await the results of that.  7. Follow-up: Will be in 3 weeks to recheck his blood counts and assess his clinical status.      [facility], MD 9/15/201612:14 PM

## 2014-09-29 NOTE — Telephone Encounter (Signed)
The patient can take Aricept with supper he does not have to be a full meal like dinner

## 2014-09-30 LAB — PSA: PSA: 4.74 ng/mL — ABNORMAL HIGH (ref <=4.00)

## 2014-10-01 ENCOUNTER — Other Ambulatory Visit: Payer: Self-pay

## 2014-10-01 ENCOUNTER — Emergency Department (HOSPITAL_COMMUNITY)
Admission: EM | Admit: 2014-10-01 | Discharge: 2014-10-02 | Disposition: A | Discharge status: Home / Self Care | Payer: PPO | Attending: Emergency Medicine | Admitting: Emergency Medicine

## 2014-10-01 ENCOUNTER — Encounter (HOSPITAL_COMMUNITY): Payer: Self-pay | Admitting: Nurse Practitioner

## 2014-10-01 DIAGNOSIS — Z79899 Other long term (current) drug therapy: Secondary | ICD-10-CM | POA: Insufficient documentation

## 2014-10-01 DIAGNOSIS — Z8679 Personal history of other diseases of the circulatory system: Secondary | ICD-10-CM | POA: Insufficient documentation

## 2014-10-01 DIAGNOSIS — Z8546 Personal history of malignant neoplasm of prostate: Secondary | ICD-10-CM | POA: Insufficient documentation

## 2014-10-01 DIAGNOSIS — Z950 Presence of cardiac pacemaker: Secondary | ICD-10-CM | POA: Insufficient documentation

## 2014-10-01 DIAGNOSIS — R4182 Altered mental status, unspecified: Secondary | ICD-10-CM | POA: Insufficient documentation

## 2014-10-01 DIAGNOSIS — N390 Urinary tract infection, site not specified: Secondary | ICD-10-CM | POA: Insufficient documentation

## 2014-10-01 DIAGNOSIS — R531 Weakness: Secondary | ICD-10-CM | POA: Diagnosis present

## 2014-10-01 DIAGNOSIS — R509 Fever, unspecified: Secondary | ICD-10-CM

## 2014-10-01 LAB — URINALYSIS, ROUTINE W REFLEX MICROSCOPIC
BILIRUBIN URINE: NEGATIVE
Glucose, UA: NEGATIVE mg/dL
Ketones, ur: NEGATIVE mg/dL
NITRITE: NEGATIVE
SPECIFIC GRAVITY, URINE: 1.019 (ref 1.005–1.030)
UROBILINOGEN UA: 0.2 mg/dL (ref 0.0–1.0)
pH: 5.5 (ref 5.0–8.0)

## 2014-10-01 LAB — COMPREHENSIVE METABOLIC PANEL
ALK PHOS: 57 U/L (ref 38–126)
ALT: 12 U/L — ABNORMAL LOW (ref 17–63)
ANION GAP: 8 (ref 5–15)
AST: 18 U/L (ref 15–41)
Albumin: 3.1 g/dL — ABNORMAL LOW (ref 3.5–5.0)
BILIRUBIN TOTAL: 0.6 mg/dL (ref 0.3–1.2)
BUN: 30 mg/dL — ABNORMAL HIGH (ref 6–20)
CALCIUM: 8.6 mg/dL — AB (ref 8.9–10.3)
CO2: 19 mmol/L — ABNORMAL LOW (ref 22–32)
Chloride: 107 mmol/L (ref 101–111)
Creatinine, Ser: 1.62 mg/dL — ABNORMAL HIGH (ref 0.61–1.24)
GFR, EST AFRICAN AMERICAN: 44 mL/min — AB (ref >60)
GFR, EST NON AFRICAN AMERICAN: 38 mL/min — AB (ref >60)
Glucose, Bld: 118 mg/dL — ABNORMAL HIGH (ref 65–99)
Potassium: 3.9 mmol/L (ref 3.5–5.1)
Sodium: 134 mmol/L — ABNORMAL LOW (ref 135–145)
TOTAL PROTEIN: 6.4 g/dL — AB (ref 6.5–8.1)

## 2014-10-01 LAB — CBC WITH DIFFERENTIAL/PLATELET
BASOS ABS: 0 10*3/uL (ref 0.0–0.1)
BASOS PCT: 0 %
Eosinophils Absolute: 0.1 10*3/uL (ref 0.0–0.7)
Eosinophils Relative: 2 %
HEMATOCRIT: 28.5 % — AB (ref 39.0–52.0)
HEMOGLOBIN: 9.4 g/dL — AB (ref 13.0–17.0)
Lymphocytes Relative: 8 %
Lymphs Abs: 0.8 10*3/uL (ref 0.7–4.0)
MCH: 29.9 pg (ref 26.0–34.0)
MCHC: 33 g/dL (ref 30.0–36.0)
MCV: 90.8 fL (ref 78.0–100.0)
Monocytes Absolute: 0.7 10*3/uL (ref 0.1–1.0)
Monocytes Relative: 7 %
NEUTROS ABS: 8 10*3/uL — AB (ref 1.7–7.7)
NEUTROS PCT: 83 %
Platelets: 270 10*3/uL (ref 150–400)
RBC: 3.14 MIL/uL — ABNORMAL LOW (ref 4.22–5.81)
RDW: 17.1 % — AB (ref 11.5–15.5)
WBC: 9.6 10*3/uL (ref 4.0–10.5)

## 2014-10-01 LAB — URINE MICROSCOPIC-ADD ON

## 2014-10-01 LAB — I-STAT CG4 LACTIC ACID, ED: Lactic Acid, Venous: 1.08 mmol/L (ref 0.5–2.0)

## 2014-10-01 MED ORDER — SODIUM CHLORIDE 0.9 % IV BOLUS (SEPSIS)
1000.0000 mL | INTRAVENOUS | Status: AC
  Administered 2014-10-01 (×2): 1000 mL via INTRAVENOUS

## 2014-10-01 MED ORDER — SODIUM CHLORIDE 0.9 % IV BOLUS (SEPSIS)
500.0000 mL | INTRAVENOUS | Status: AC
  Administered 2014-10-01: 500 mL via INTRAVENOUS

## 2014-10-01 MED ORDER — DEXTROSE 5 % IV SOLN
1.0000 g | Freq: Once | INTRAVENOUS | Status: AC
  Administered 2014-10-01: 1 g via INTRAVENOUS
  Filled 2014-10-01: qty 10

## 2014-10-01 MED ORDER — ACETAMINOPHEN 325 MG PO TABS
650.0000 mg | ORAL_TABLET | Freq: Once | ORAL | Status: AC
  Administered 2014-10-01: 650 mg via ORAL
  Filled 2014-10-01: qty 2

## 2014-10-01 NOTE — ED Provider Notes (Signed)
CSN: [id number]     Arrival date & time 10/01/14  2146 History   First MD Initiated Contact with Patient 10/01/14 2206     Chief Complaint  Patient presents with  . Weakness  . Altered Mental Status     (Consider location/radiation/quality/duration/timing/severity/associated sxs/prior Treatment) Patient is a 81 y.o. male presenting with weakness and altered mental status. The history is provided by the patient and medical records.  Weakness Associated symptoms include weakness.  Altered Mental Status Associated symptoms: weakness     81 year old male with history of A. fib on xarelto, benign tremor, SSS, recurrent prostate cancer s/p resection with indwelling foley catheter and ureteral stents, presenting to the ED for generalized weakness and AMS.  Patient's daughter states today patient appeared more confused than normal and was complaining of subjective fever and chills. Patient does have indwelling Foley catheter and history of recurrent UTI. Patient also recently started Aricept yesterday by his neurologist, however he has not been diagnosed with dementia.  No other medication changes.  Patient has continued eating and drinking normally.  Denies current chest pain, SOB, abdominal pain, nausea, vomiting, or diarrhea.  Of note, patient is due to stent exchange on 10/10/14 with his urologist, Dr. Diona Fanti.  He has pre-op appt on Monday 10/03/14.  Patient febrile on arrival, remainder of VSS.    Past Medical History  Diagnosis Date  . Orthostatic lightheadedness   . History of subdural hematoma     LEFT CHRONIC SUBDURAL HEMATOMA   S/P PARIETAL CRANIOTOMY WITH EVACUATION HEMATOMA  . Atrial fibrillation, persistent   . H/O cardiac radiofrequency ablation     A-FLUTTER  . Cardiac pacemaker last check 08-30-2013    05-05-2007  DD  [employer] GUIDANT INSIGNIA  . RBBB   . Benign essential tremor   . Diverticulosis of colon   . Tachy-brady syndrome   . SSS (sick sinus syndrome)   .  Hydronephrosis, bilateral   . Renal insufficiency   . Recurrent prostate carcinoma     2005  S/P RADIOACTIVE SEED IMPLANTS/   BLADDER NECK MASS RESECTED 05/ 2015  SHOWED RECURRENT PROSTATE CANCER   Past Surgical History  Procedure Laterality Date  . Tonsillectomy  as child  . Subdural hematoma evacuation via craniotomy  12-16-2007  . Rotator cuff repair w/ distal clavicle excision Left 08-31-2010  . Pacemaker insertion  05-05-2007      Guidant Insignia [employer])  . Total knee arthroplasty  12/18/2010    Procedure: TOTAL KNEE ARTHROPLASTY;  Surgeon: Mauri Pole;  Location: [facility];  Service: Orthopedics;  Laterality: Left;  . Cataract extraction    . Cystoscopy w/ ureteral stent placement Bilateral 05/30/2013    Procedure: CYSTOSCOPY WITH RETROGRADE PYELOGRAM/ BILATERAL URETERAL STENT PLACEMENT, TRANSURETRAL RESECTION OF BLADDER TUMOR WITH GYRUS;  Surgeon: Alexis Frock, MD;  Location: [facility];  Service: Urology;  Laterality: Bilateral;  . Cardioversion N/A 08/04/2013    Procedure: CARDIOVERSION;  Surgeon: Sanda Klein, MD;  Location: [facility];  Service: Cardiovascular;  Laterality: N/A;  . Knee arthroscopy Left 02-11-2007  . Cardioversion  multiple--  last one 08-04-2013    SINCE 2008  . Cardiac electrophysiology mapping and ablation  04-06-2007  dr Caryl Comes  . Cardiac catheterization  12-22-2006      non-obstructive CAD / RCA 20%/  ef 50%/  a-fib  . Radioactive prostate seed implants  12-21-2003  . Orif left distal clavical fx  08-31-2010  . Colonoscopy w/ polypectomy  12-01-2008  . Transthoracic echocardiogram  05-31-2013  MILD LVH/  EF 65%/  MODERATE LAE/  TRIVIAL MR  &  TR  . Cystoscopy/retrograde/ureteroscopy Right 09/16/2013    Procedure: CYSTOSCOPY/RETROGRADE/URETEROSCOPIC STENT EXTRACTION/  bil retrograde pyelogram with inseertion of bilateral stents;  Surgeon: Jorja Loa, MD;  Location: [facility];  Service: Urology;  Laterality: Right;   Family  History  Problem Relation Age of Onset  . Aneurysm Father   . Stroke Father    Social History  Substance Use Topics  . Smoking status: Never Smoker   . Smokeless tobacco: Never Used  . Alcohol Use: 12.6 oz/week    7 Glasses of wine, 7 Standard drinks or equivalent, 7 Shots of liquor per week     Comment: 2 glasses wine day    Review of Systems  Neurological: Positive for weakness.  All other systems reviewed and are negative.     Allergies  Review of patient's allergies indicates no known allergies.  Home Medications   Prior to Admission medications   Medication Sig Start Date End Date Taking? Authorizing Provider  calcium citrate-vitamin D (CITRACAL+D) 315-200 MG-UNIT per tablet Take 1 tablet by mouth 2 (two) times daily.    Historical Provider, MD  donepezil (ARICEPT) 5 MG tablet Take 1 tablet (5 mg total) by mouth at bedtime. Start one tablet with food daily x 1 month then two tablets daily 09/26/14   Garvin Fila, MD  glucosamine-chondroitin 500-400 MG tablet Take 1 tablet by mouth 3 (three) times daily.    Historical Provider, MD  levothyroxine (SYNTHROID, LEVOTHROID) 137 MCG tablet Take 137 mcg by mouth daily before breakfast.    Historical Provider, MD  megestrol (MEGACE) 20 MG tablet Take 20 mg by mouth daily.     Historical Provider, MD  Multiple Vitamin (MULTIVITAMIN WITH MINERALS) TABS tablet Take 1 tablet by mouth daily.    Historical Provider, MD  pramipexole (MIRAPEX) 0.125 MG tablet 1 TABLET BEFORE BEDTIME ONCE A DAY ORALLY 30 09/09/14   Historical Provider, MD  PRESCRIPTION MEDICATION Supportive therapy    [place]    Historical Provider, MD  propafenone (RYTHMOL) 225 MG tablet TAKE 1 TABLET (225 MG TOTAL) BY MOUTH 2 (TWO) TIMES DAILY. 06/06/14   Deboraha Sprang, MD  propranolol (INDERAL) 10 MG tablet Take 10 mg by mouth 2 (two) times daily.  07/16/14   Historical Provider, MD  rivaroxaban (XARELTO) 15 MG TABS tablet Take 1 tablet (15 mg total) by mouth daily with  supper. Patient taking differently: Take 15 mg by mouth at bedtime. At 10pm 05/09/14   Deboraha Sprang, MD  zolpidem (AMBIEN) 10 MG tablet Take 10 mg by mouth at bedtime as needed for sleep.  03/29/14   Historical Provider, MD   BP 122/56 mmHg  Pulse 64  Temp(Src) 101.7 F (38.7 C) (Rectal)  Resp 18  SpO2 95%   Physical Exam  Constitutional: He is oriented to person, place, and time. He appears well-developed and well-nourished. No distress.  HENT:  Head: Normocephalic and atraumatic.  Mouth/Throat: Oropharynx is clear and moist.  Eyes: Conjunctivae and EOM are normal. Pupils are equal, round, and reactive to light.  Neck: Normal range of motion. Neck supple.  Cardiovascular: Normal rate, regular rhythm and normal heart sounds.   Pulmonary/Chest: Effort normal and breath sounds normal. No respiratory distress. He has no wheezes.  Abdominal: Soft. Bowel sounds are normal. There is no tenderness. There is no guarding.  Genitourinary:  Indwelling Foley catheter in place, some bloody urine noted in  collection bag  Musculoskeletal: Normal range of motion.  Neurological: He is alert and oriented to person, place, and time.  AAOx3, answering questions and following commands appropriately; equal strength UE and LE bilaterally; CN grossly intact; moves all extremities appropriately without ataxia; no focal neuro deficits or facial asymmetry appreciated  Skin: Skin is warm and dry. He is not diaphoretic.  Psychiatric: He has a normal mood and affect.  Nursing note and vitals reviewed.   ED Course  Procedures (including critical care time) Labs Review Labs Reviewed  COMPREHENSIVE METABOLIC PANEL - Abnormal; Notable for the following:    Sodium 134 (*)    CO2 19 (*)    Glucose, Bld 118 (*)    BUN 30 (*)    Creatinine, Ser 1.62 (*)    Calcium 8.6 (*)    Total Protein 6.4 (*)    Albumin 3.1 (*)    ALT 12 (*)    GFR calc non Af Amer 38 (*)    GFR calc Af Amer 44 (*)    All other  components within normal limits  CBC WITH DIFFERENTIAL/PLATELET - Abnormal; Notable for the following:    RBC 3.14 (*)    Hemoglobin 9.4 (*)    HCT 28.5 (*)    RDW 17.1 (*)    Neutro Abs 8.0 (*)    All other components within normal limits  URINALYSIS, ROUTINE W REFLEX MICROSCOPIC (NOT AT [facility]) - Abnormal; Notable for the following:    Color, Urine RED (*)    APPearance TURBID (*)    Hgb urine dipstick LARGE (*)    Protein, ur >300 (*)    Leukocytes, UA LARGE (*)    All other components within normal limits  CULTURE, BLOOD (ROUTINE X 2)  CULTURE, BLOOD (ROUTINE X 2)  URINE CULTURE  URINE MICROSCOPIC-ADD ON  I-STAT CG4 LACTIC ACID, ED    Imaging Review No results found. I have personally reviewed and evaluated these images and lab results as part of my medical decision-making.   EKG Interpretation None      MDM   Final diagnoses:  UTI (lower urinary tract infection)  Fever, unspecified fever cause   81 year old male here with fever and questionable change in mental status. Patient's daughter reports he has some confusion at baseline, however seem somewhat worse than normal today. Patient is febrile on arrival but is nontoxic in appearance. He has some blood noticed in his Foley catheter which daughter states is normal for him given his ureteral stents. He is also on Xarelto. Lab work was obtained, lactic acid and blood blood cell count within normal limits. Renal function appears baseline for patient when compared with previous. UA does appear infectious, culture pending. Patient was given dose of IV Rocephin here. Patient has remained awake, alert, and answering questions and following commands appropriately. Discussed options of admission versus outpatient management, patient and daughter would prefer for him to go home. Given his ureteral stents I have discussed case with urology,  Dr. Carlota Raspberry--- feels it is ok for patient to go home since he is stable.  Recommends ciprofloxacin  pending urine culture.  Patient will follow-up Monday at pre-op appt.  Discussed plan with patient, he/she acknowledged understanding and agreed with plan of care.  Return precautions given for new or worsening symptoms.  Case discussed with attending physician, Dr. Betsey Holiday, who evaluated patient and agrees with assessment and plan of care.  Larene Pickett, PA-C 10/02/14 7517  Orpah Greek, MD 10/02/14 2245

## 2014-10-01 NOTE — ED Notes (Signed)
Pt is presented by EMS from home, report of generalized weakness and confusion per family who report pt is below his baseline.

## 2014-10-01 NOTE — ED Notes (Signed)
Bed: WA21 Expected date:  Expected time:  Means of arrival:  Comments: Ems- elderly uti

## 2014-10-02 MED ORDER — CIPROFLOXACIN HCL 500 MG PO TABS: 500.0000 mg | ORAL_TABLET | Freq: Two times a day (BID) | ORAL | Status: DC

## 2014-10-02 NOTE — Discharge Instructions (Signed)
Take the prescribed medication as directed.  May continue tylenol or motrin as needed for fever. Follow-up with Dr. Diona Fanti Monday at your pre-op appt as scheduled. Return to the ED for new or worsening symptoms-- worsening fever, changes in mental status, nausea, vomiting, etc.

## 2014-10-03 ENCOUNTER — Inpatient Hospital Stay (HOSPITAL_COMMUNITY)
Admission: RE | Admit: 2014-10-03 | Discharge: 2014-10-03 | Disposition: A | Discharge status: Home / Self Care | Payer: PPO | Source: Ambulatory Visit

## 2014-10-03 ENCOUNTER — Inpatient Hospital Stay (HOSPITAL_COMMUNITY): Admission: RE | Admit: 2014-10-03 | Payer: PPO | Source: Ambulatory Visit

## 2014-10-03 ENCOUNTER — Encounter (HOSPITAL_COMMUNITY): Payer: Self-pay

## 2014-10-03 ENCOUNTER — Encounter (HOSPITAL_COMMUNITY)
Admission: RE | Admit: 2014-10-03 | Discharge: 2014-10-03 | Disposition: A | Discharge status: Home / Self Care | Payer: PPO | Source: Ambulatory Visit | Attending: Urology | Admitting: Urology

## 2014-10-03 DIAGNOSIS — Z01818 Encounter for other preprocedural examination: Secondary | ICD-10-CM | POA: Diagnosis present

## 2014-10-03 DIAGNOSIS — N1330 Unspecified hydronephrosis: Secondary | ICD-10-CM | POA: Diagnosis not present

## 2014-10-03 LAB — CBC
HCT: 29.6 % — ABNORMAL LOW (ref 39.0–52.0)
HEMOGLOBIN: 9.6 g/dL — AB (ref 13.0–17.0)
MCH: 29.9 pg (ref 26.0–34.0)
MCHC: 32.4 g/dL (ref 30.0–36.0)
MCV: 92.2 fL (ref 78.0–100.0)
Platelets: 362 10*3/uL (ref 150–400)
RBC: 3.21 MIL/uL — AB (ref 4.22–5.81)
RDW: 16.9 % — ABNORMAL HIGH (ref 11.5–15.5)
WBC: 7.7 10*3/uL (ref 4.0–10.5)

## 2014-10-03 NOTE — Progress Notes (Signed)
Patient In ED on 10/01/14 with UTI.  Abbreviated preop visit done with repeat of CBC , consent form signed and reviewed meds and instructions given. Wife present at time of preop appt. Patient then taken to [place] with wife .  Patient in wheelchair with foley intact and draining.  07/21/14- EKG and CXR- EPIC  02/03/14- last device check in EPIC.   10/01/14- CBC and CMP in EPIC.

## 2014-10-03 NOTE — Progress Notes (Signed)
CBC done 10/03/14 and CMP done 10/01/14 faxed via EPIC to DR Dahlstedt.

## 2014-10-03 NOTE — Patient Instructions (Addendum)
[name]  10/03/2014   Your procedure is scheduled on:   10/10/2014   Report to [facility] Main  Entrance take [place]  elevators to 3rd floor to  [place] at   0800 AM.  Call this number if you have problems the morning of surgery [phone]   Remember: ONLY 1 PERSON MAY GO WITH YOU TO SHORT STAY TO GET  READY MORNING OF [place].  Do not eat food or drink liquids :After Midnight.     Take these medicines the morning of surgery with A SIP OF WATER:  Evothyroxine, propanolol, Rythmol                                You may not have any metal on your body including hair pins and              piercings  Do not wear jewelry, , lotions, powders or perfumes, deodorant                       Men may shave face and neck.   Do not bring valuables to the hospital. [place].  Contacts, dentures or bridgework may not be worn into surgery.  Leave suitcase in the car. After surgery it may be brought to your room.     Patients discharged the day of surgery will not be allowed to drive home.  Name and phone number of your driver:  Special Instructions: coughing and deep breathing exercises, leg exercises               Please read over the following fact sheets you were given: _____________________________________________________________________             [facility] - Preparing for Surgery Before surgery, you can play an important role.  Because skin is not sterile, your skin needs to be as free of germs as possible.  You can reduce the number of germs on your skin by washing with CHG (chlorahexidine gluconate) soap before surgery.  CHG is an antiseptic cleaner which kills germs and bonds with the skin to continue killing germs even after washing. Please DO NOT use if you have an allergy to CHG or antibacterial soaps.  If your skin becomes reddened/irritated stop using the CHG and inform your nurse when  you arrive at Short Stay. Do not shave (including legs and underarms) for at least 48 hours prior to the first CHG shower.  You may shave your face/neck. Please follow these instructions carefully:  1.  Shower with CHG Soap the night before surgery and the  morning of Surgery.  2.  If you choose to wash your hair, wash your hair first as usual with your  normal  shampoo.  3.  After you shampoo, rinse your hair and body thoroughly to remove the  shampoo.                           4.  Use CHG as you would any other liquid soap.  You can apply chg directly  to the skin and wash  Gently with a scrungie or clean washcloth.  5.  Apply the CHG Soap to your body ONLY FROM THE NECK DOWN.   Do not use on face/ open                           Wound or open sores. Avoid contact with eyes, ears mouth and genitals (private parts).                       Wash face,  Genitals (private parts) with your normal soap.             6.  Wash thoroughly, paying special attention to the area where your surgery  will be performed.  7.  Thoroughly rinse your body with warm water from the neck down.  8.  DO NOT shower/wash with your normal soap after using and rinsing off  the CHG Soap.                9.  Pat yourself dry with a clean towel.            10.  Wear clean pajamas.            11.  Place clean sheets on your bed the night of your first shower and do not  sleep with pets. Day of Surgery : Do not apply any lotions/deodorants the morning of surgery.  Please wear clean clothes to the hospital/surgery center.  FAILURE TO FOLLOW THESE INSTRUCTIONS MAY RESULT IN THE CANCELLATION OF YOUR SURGERY PATIENT SIGNATURE_________________________________  NURSE SIGNATURE__________________________________  ________________________________________________________________________

## 2014-10-03 NOTE — Telephone Encounter (Signed)
Rn talk to patients wife [name] about her husbands medication. Rn explain to that wife per Dr. Leonie Man note he can take the Aricept with supper does not have to be a full meal. Patients wife verbalized understanding on the Aricept medication for her husband.

## 2014-10-04 ENCOUNTER — Telehealth: Payer: Self-pay | Admitting: *Deleted

## 2014-10-04 LAB — URINE CULTURE

## 2014-10-04 NOTE — Telephone Encounter (Signed)
Spoke with patient who handed phone to his wife.  Informed that Dr. Felipa Eth decreased medication secondary to hypotension. Discussed how patient is currently doing.  She states he is doing fine.  BPs normal, SBP avg around 118.  HRs normal. Informed wife to continue Inderal 10 mg TID as prescribed by Dr. Felipa Eth. She verbalized understanding and agreeable to plan.

## 2014-10-07 LAB — CULTURE, BLOOD (ROUTINE X 2)
CULTURE: NO GROWTH
Culture: NO GROWTH

## 2014-10-09 MED ORDER — GENTAMICIN SULFATE 40 MG/ML IJ SOLN
5.0000 mg/kg | INTRAVENOUS | Status: AC
  Administered 2014-10-10: 390 mg via INTRAVENOUS
  Filled 2014-10-09: qty 9.75

## 2014-10-10 ENCOUNTER — Encounter (HOSPITAL_COMMUNITY)
Admission: RE | Disposition: A | Discharge status: Home / Self Care | Payer: Self-pay | Source: Ambulatory Visit | Attending: Urology

## 2014-10-10 ENCOUNTER — Ambulatory Visit (HOSPITAL_COMMUNITY)
Admission: RE | Admit: 2014-10-10 | Discharge: 2014-10-10 | Disposition: A | Discharge status: Home / Self Care | Payer: PPO | Source: Ambulatory Visit | Attending: Urology | Admitting: Urology

## 2014-10-10 ENCOUNTER — Ambulatory Visit (HOSPITAL_COMMUNITY): Payer: PPO | Admitting: Certified Registered Nurse Anesthetist

## 2014-10-10 ENCOUNTER — Encounter (HOSPITAL_COMMUNITY): Payer: Self-pay | Admitting: *Deleted

## 2014-10-10 DIAGNOSIS — G250 Essential tremor: Secondary | ICD-10-CM | POA: Insufficient documentation

## 2014-10-10 DIAGNOSIS — N131 Hydronephrosis with ureteral stricture, not elsewhere classified: Secondary | ICD-10-CM | POA: Insufficient documentation

## 2014-10-10 DIAGNOSIS — Z79899 Other long term (current) drug therapy: Secondary | ICD-10-CM | POA: Insufficient documentation

## 2014-10-10 DIAGNOSIS — I4892 Unspecified atrial flutter: Secondary | ICD-10-CM | POA: Insufficient documentation

## 2014-10-10 DIAGNOSIS — N135 Crossing vessel and stricture of ureter without hydronephrosis: Secondary | ICD-10-CM | POA: Insufficient documentation

## 2014-10-10 DIAGNOSIS — I495 Sick sinus syndrome: Secondary | ICD-10-CM | POA: Insufficient documentation

## 2014-10-10 DIAGNOSIS — Z96652 Presence of left artificial knee joint: Secondary | ICD-10-CM | POA: Insufficient documentation

## 2014-10-10 DIAGNOSIS — Z950 Presence of cardiac pacemaker: Secondary | ICD-10-CM | POA: Insufficient documentation

## 2014-10-10 DIAGNOSIS — C61 Malignant neoplasm of prostate: Secondary | ICD-10-CM | POA: Insufficient documentation

## 2014-10-10 DIAGNOSIS — I4891 Unspecified atrial fibrillation: Secondary | ICD-10-CM | POA: Diagnosis not present

## 2014-10-10 DIAGNOSIS — Z7901 Long term (current) use of anticoagulants: Secondary | ICD-10-CM | POA: Insufficient documentation

## 2014-10-10 DIAGNOSIS — Z79818 Long term (current) use of other agents affecting estrogen receptors and estrogen levels: Secondary | ICD-10-CM | POA: Insufficient documentation

## 2014-10-10 DIAGNOSIS — I4510 Unspecified right bundle-branch block: Secondary | ICD-10-CM | POA: Insufficient documentation

## 2014-10-10 HISTORY — PX: CYSTOSCOPY W/ URETERAL STENT PLACEMENT: SHX1429

## 2014-10-10 SURGERY — CYSTOSCOPY, FLEXIBLE, WITH STENT REPLACEMENT
Anesthesia: General | Laterality: Bilateral

## 2014-10-10 MED ORDER — IOHEXOL 300 MG/ML  SOLN
INTRAMUSCULAR | Status: DC | PRN
  Administered 2014-10-10: 10 mL

## 2014-10-10 MED ORDER — SODIUM CHLORIDE 0.9 % IR SOLN
Status: DC | PRN
  Administered 2014-10-10: 1000 mL

## 2014-10-10 MED ORDER — FENTANYL CITRATE (PF) 100 MCG/2ML IJ SOLN
25.0000 ug | INTRAMUSCULAR | Status: DC | PRN
  Administered 2014-10-10: 50 ug via INTRAVENOUS

## 2014-10-10 MED ORDER — FENTANYL CITRATE (PF) 100 MCG/2ML IJ SOLN
INTRAMUSCULAR | Status: AC
  Filled 2014-10-10: qty 2

## 2014-10-10 MED ORDER — SULFAMETHOXAZOLE-TRIMETHOPRIM 800-160 MG PO TABS: 1.0000 | ORAL_TABLET | Freq: Two times a day (BID) | ORAL | Status: DC

## 2014-10-10 MED ORDER — SODIUM CHLORIDE 0.9 % IJ SOLN
INTRAMUSCULAR | Status: AC
  Filled 2014-10-10: qty 10

## 2014-10-10 MED ORDER — 0.9 % SODIUM CHLORIDE (POUR BTL) OPTIME
TOPICAL | Status: DC | PRN
  Administered 2014-10-10: 1000 mL

## 2014-10-10 MED ORDER — PROPOFOL 10 MG/ML IV BOLUS
INTRAVENOUS | Status: DC | PRN
  Administered 2014-10-10: 20 mg via INTRAVENOUS
  Administered 2014-10-10: 50 mg via INTRAVENOUS
  Administered 2014-10-10: 180 mg via INTRAVENOUS

## 2014-10-10 MED ORDER — PHENYLEPHRINE HCL 10 MG/ML IJ SOLN
INTRAMUSCULAR | Status: DC | PRN
  Administered 2014-10-10 (×4): 80 ug via INTRAVENOUS
  Administered 2014-10-10: 120 ug via INTRAVENOUS

## 2014-10-10 MED ORDER — STERILE WATER FOR IRRIGATION IR SOLN
Status: DC | PRN
  Administered 2014-10-10: 1000 mL

## 2014-10-10 MED ORDER — ONDANSETRON HCL 4 MG/2ML IJ SOLN
INTRAMUSCULAR | Status: DC | PRN
  Administered 2014-10-10: 4 mg via INTRAVENOUS

## 2014-10-10 MED ORDER — ONDANSETRON HCL 4 MG/2ML IJ SOLN: 4.0000 mg | Freq: Once | INTRAMUSCULAR | Status: DC | PRN

## 2014-10-10 MED ORDER — STERILE WATER FOR IRRIGATION IR SOLN
Status: DC | PRN
  Administered 2014-10-10: 3000 mL

## 2014-10-10 MED ORDER — DEXAMETHASONE SODIUM PHOSPHATE 10 MG/ML IJ SOLN
INTRAMUSCULAR | Status: AC
  Filled 2014-10-10: qty 1

## 2014-10-10 MED ORDER — LIDOCAINE HCL (CARDIAC) 20 MG/ML IV SOLN
INTRAVENOUS | Status: DC | PRN
  Administered 2014-10-10: 80 mg via INTRAVENOUS

## 2014-10-10 MED ORDER — LIDOCAINE HCL (CARDIAC) 20 MG/ML IV SOLN
INTRAVENOUS | Status: AC
  Filled 2014-10-10: qty 5

## 2014-10-10 MED ORDER — FENTANYL CITRATE (PF) 100 MCG/2ML IJ SOLN
INTRAMUSCULAR | Status: AC
  Filled 2014-10-10: qty 4

## 2014-10-10 MED ORDER — LACTATED RINGERS IV SOLN
INTRAVENOUS | Status: DC | PRN
  Administered 2014-10-10 (×2): via INTRAVENOUS

## 2014-10-10 MED ORDER — PHENYLEPHRINE 40 MCG/ML (10ML) SYRINGE FOR IV PUSH (FOR BLOOD PRESSURE SUPPORT)
PREFILLED_SYRINGE | INTRAVENOUS | Status: AC
  Filled 2014-10-10: qty 20

## 2014-10-10 MED ORDER — FENTANYL CITRATE (PF) 100 MCG/2ML IJ SOLN
INTRAMUSCULAR | Status: DC | PRN
  Administered 2014-10-10 (×2): 50 ug via INTRAVENOUS
  Administered 2014-10-10: 25 ug via INTRAVENOUS
  Administered 2014-10-10: 50 ug via INTRAVENOUS

## 2014-10-10 MED ORDER — PROPOFOL 10 MG/ML IV BOLUS
INTRAVENOUS | Status: AC
  Filled 2014-10-10: qty 20

## 2014-10-10 MED ORDER — EPHEDRINE SULFATE 50 MG/ML IJ SOLN
INTRAMUSCULAR | Status: AC
  Filled 2014-10-10: qty 1

## 2014-10-10 MED ORDER — DEXAMETHASONE SODIUM PHOSPHATE 10 MG/ML IJ SOLN
INTRAMUSCULAR | Status: DC | PRN
  Administered 2014-10-10: 10 mg via INTRAVENOUS

## 2014-10-10 MED ORDER — ONDANSETRON HCL 4 MG/2ML IJ SOLN
INTRAMUSCULAR | Status: AC
  Filled 2014-10-10: qty 2

## 2014-10-10 SURGICAL SUPPLY — 24 items
BAG URINE DRAINAGE (UROLOGICAL SUPPLIES) ×2 IMPLANT
BAG URO CATCHER STRL LF (DRAPE) ×3 IMPLANT
CATH FOLEY 2WAY SLVR  5CC 18FR (CATHETERS) ×2
CATH FOLEY 2WAY SLVR 5CC 18FR (CATHETERS) IMPLANT
CATH INTERMIT  6FR 70CM (CATHETERS) ×3 IMPLANT
CLOTH BEACON ORANGE TIMEOUT ST (SAFETY) ×3 IMPLANT
FIBER LASER FLEXIVA 1000 (UROLOGICAL SUPPLIES) IMPLANT
FIBER LASER FLEXIVA 200 (UROLOGICAL SUPPLIES) IMPLANT
FIBER LASER FLEXIVA 365 (UROLOGICAL SUPPLIES) IMPLANT
FIBER LASER FLEXIVA 550 (UROLOGICAL SUPPLIES) IMPLANT
FIBER LASER TRAC TIP (UROLOGICAL SUPPLIES) IMPLANT
GLOVE BIOGEL M 8.0 STRL (GLOVE) ×3 IMPLANT
GOWN STRL REUS W/ TWL LRG LVL3 (GOWN DISPOSABLE) IMPLANT
GOWN STRL REUS W/TWL LRG LVL3 (GOWN DISPOSABLE) ×3
GOWN STRL REUS W/TWL XL LVL3 (GOWN DISPOSABLE) ×3 IMPLANT
GUIDEWIRE ANG ZIPWIRE 038X150 (WIRE) ×2 IMPLANT
GUIDEWIRE STR DUAL SENSOR (WIRE) ×3 IMPLANT
MANIFOLD NEPTUNE II (INSTRUMENTS) ×3 IMPLANT
PACK CYSTO (CUSTOM PROCEDURE TRAY) ×3 IMPLANT
SHEATH ACCESS URETERAL 38CM (SHEATH) ×1 IMPLANT
STENT CONTOUR 7FRX26X.038 (STENTS) ×4 IMPLANT
TUBING CONNECTING 10 (TUBING) ×2 IMPLANT
TUBING CONNECTING 10' (TUBING) ×1
WIRE COONS/BENSON .038X145CM (WIRE) ×2 IMPLANT

## 2014-10-10 NOTE — Anesthesia Procedure Notes (Signed)
Procedure Name: LMA Insertion Date/Time: 10/10/2014 10:24 AM Performed by: Montel Clock Pre-anesthesia Checklist: Patient identified, Emergency Drugs available, Suction available, Patient being monitored and Timeout performed Patient Re-evaluated:Patient Re-evaluated prior to inductionOxygen Delivery Method: Circle system utilized Preoxygenation: Pre-oxygenation with 100% oxygen Intubation Type: IV induction Ventilation: Mask ventilation without difficulty LMA: LMA with gastric port inserted LMA Size: 5.0 Number of attempts: 1 Dental Injury: Teeth and Oropharynx as per pre-operative assessment

## 2014-10-10 NOTE — Anesthesia Preprocedure Evaluation (Addendum)
Anesthesia Evaluation  Patient identified by MRN, date of birth, ID band Patient awake  General Assessment Comment: Bradycardia         s/p PPM   .  Atrial flutter         s/p ablation   .  Atrial fibrillation     .  Prostate cancer  12-21-2003       12-7-seed implants done   .  Hematoma     . Marland Kitchen  Pacemaker --[employer]     .  Orthostatic lightheadedness     .  Subdural hematoma     Reviewed: Allergy & Precautions, H&P , NPO status , Patient's Chart, lab work & pertinent test results  Airway Mallampati: II  TM Distance: >3 FB Neck ROM: Full    Dental no notable dental hx.    Pulmonary neg pulmonary ROS,    Pulmonary exam normal breath sounds clear to auscultation       Cardiovascular Exercise Tolerance: Good Normal cardiovascular exam+ dysrhythmias Atrial Fibrillation + pacemaker  Rhythm:Regular Rate:Normal  05/2013 Echo -EF normal, 60%, no RWMAs, trivial MR   Neuro/Psych negative neurological ROS  negative psych ROS   GI/Hepatic negative GI ROS, Neg liver ROS,   Endo/Other  negative endocrine ROS  Renal/GU CRFRenal diseaseCr 1.6  negative genitourinary   Musculoskeletal negative musculoskeletal ROS (+)   Abdominal   Peds negative pediatric ROS (+)  Hematology  (+) anemia ,   Anesthesia Other Findings   Reproductive/Obstetrics negative OB ROS                           Anesthesia Physical  Anesthesia Plan  ASA: III  Anesthesia Plan: General   Post-op Pain Management:    Induction: Intravenous  Airway Management Planned: LMA  Additional Equipment:   Intra-op Plan:   Post-operative Plan: Extubation in OR  Informed Consent: I have reviewed the patients History and Physical, chart, labs and discussed the procedure including the risks, benefits and alternatives for the proposed anesthesia with the patient or authorized representative who has indicated his/her  understanding and acceptance.   Dental advisory given  Plan Discussed with: CRNA, Anesthesiologist and Surgeon  Anesthesia Plan Comments: (Pt appears pacemaker dependent due to bradycardia indication, atrial paced on EKG, have magnet available but should not have to use since electrocautery is below umbilicus Xarelto last taken 9/17)       Anesthesia Quick Evaluation

## 2014-10-10 NOTE — Anesthesia Postprocedure Evaluation (Signed)
  Anesthesia Post-op Note  Patient: [name]  Procedure(s) Performed: Procedure(s) (LRB): CYSTOSCOPY, WITH STENT REPLACEMENT, RIGHT URETEROSCOPY  (Bilateral)  Patient Location: PACU  Anesthesia Type: General  Level of Consciousness: awake and alert   Airway and Oxygen Therapy: Patient Spontanous Breathing  Post-op Pain: mild  Post-op Assessment: Post-op Vital signs reviewed, Patient's Cardiovascular Status Stable, Respiratory Function Stable, Patent Airway and No signs of Nausea or vomiting  Last Vitals:  Filed Vitals:   10/10/14 1200  BP: 141/66  Pulse: 60  Temp:   Resp: 14    Post-op Vital Signs: stable   Complications: No apparent anesthesia complications

## 2014-10-10 NOTE — Discharge Instructions (Signed)
General Anesthesia, Care After Refer to this sheet in the next few weeks. These instructions provide you with information on caring for yourself after your procedure. Your health care provider may also give you more specific instructions. Your treatment has been planned according to current medical practices, but problems sometimes occur. Call your health care provider if you have any problems or questions after your procedure. WHAT TO EXPECT AFTER THE PROCEDURE After the procedure, it is typical to experience:  Sleepiness.  Nausea and vomiting. HOME CARE INSTRUCTIONS  For the first 24 hours after general anesthesia:  Have a responsible person with you.  Do not drive a car. If you are alone, do not take public transportation.  Do not drink alcohol.  Do not take medicine that has not been prescribed by your health care provider.  Do not sign important papers or make important decisions.  You may resume a normal diet and activities as directed by your health care provider.  Change bandages (dressings) as directed.  If you have questions or problems that seem related to general anesthesia, call the hospital and ask for the anesthetist or anesthesiologist on call. SEEK MEDICAL CARE IF:  You have nausea and vomiting that continue the day after anesthesia.  You develop a rash. SEEK IMMEDIATE MEDICAL CARE IF:   You have difficulty breathing.  You have chest pain.  You have any allergic problems. Document Released: 04/08/2000 Document Revised: 01/05/2013 Document Reviewed: 07/16/2012 [facility] Patient Information 2015 [place], [place]. This information is not intended to replace advice given to you by your health care provider. Make sure you discuss any questions you have with your health care provider. Ureteral Stent Implantation, Care After Refer to this sheet in the next few weeks. These instructions provide you with information on caring for yourself after your procedure. Your  health care provider may also give you more specific instructions. Your treatment has been planned according to current medical practices, but problems sometimes occur. Call your health care provider if you have any problems or questions after your procedure. WHAT TO EXPECT AFTER THE PROCEDURE You should be back to normal activity within 48 hours after the procedure. Nausea and vomiting may occur and are commonly the result of anesthesia. It is common to experience sharp pain in the back or lower abdomen and penis with voiding. This is caused by movement of the ends of the stent with the act of urinating.It usually goes away within minutes after you have stopped urinating. HOME CARE INSTRUCTIONS Make sure to drink plenty of fluids. You may have small amounts of bleeding, causing your urine to be red. This is normal. Certain movements may trigger pain or a feeling that you need to urinate. You may be given medicines to prevent infection or bladder spasms. Be sure to take all medicines as directed. Only take over-the-counter or prescription medicines for pain, discomfort, or fever as directed by your health care provider. Do not take aspirin, as this can make bleeding worse. Your stent will be left in until the blockage is resolved. This may take 2 weeks or longer, depending on the reason for stent implantation. You may have an X-ray exam to make sure your ureter is open and that the stent has not moved out of position (migrated). The stent can be removed by your health care provider in the office. Medicines may be given for comfort while the stent is being removed. Be sure to keep all follow-up appointments so your health care provider can check  that you are healing properly. SEEK MEDICAL CARE IF:  You experience increasing pain.  Your pain medicine is not working. SEEK IMMEDIATE MEDICAL CARE IF:  Your urine is dark red or has blood clots.  You are leaking urine (incontinent).  You have a fever,  chills, feeling sick to your stomach (nausea), or vomiting.  Your pain is not relieved by pain medicine.  The end of the stent comes out of the urethra.  You are unable to urinate. Document Released: 09/02/2012 Document Revised: 01/05/2013 Document Reviewed: 09/02/2012 [facility] Patient Information 2015 [place], [place]. This information is not intended to replace advice given to you by your health care provider. Make sure you discuss any questions you have with your health care provider. Cystoscopy, Care After Refer to this sheet in the next few weeks. These instructions provide you with information on caring for yourself after your procedure. Your caregiver may also give you more specific instructions. Your treatment has been planned according to current medical practices, but problems sometimes occur. Call your caregiver if you have any problems or questions after your procedure. HOME CARE INSTRUCTIONS  Things you can do to ease any discomfort after your procedure include:  Drinking enough water and fluids to keep your urine clear or pale yellow.  Taking a warm bath to relieve any burning feelings. SEEK IMMEDIATE MEDICAL CARE IF:   You have an increase in blood in your urine.  You notice blood clots in your urine.  You have difficulty passing urine.  You have the chills.  You have abdominal pain.  You have a fever or persistent symptoms for more than 2-3 days.  You have a fever and your symptoms suddenly get worse. MAKE SURE YOU:   Understand these instructions.  Will watch your condition.  Will get help right away if you are not doing well or get worse. Document Released: 07/20/2004 Document Revised: 09/02/2012 Document Reviewed: 06/24/2011 [facility] Patient Information 2015 [place], [place]. This information is not intended to replace advice given to you by your health care provider. Make sure you discuss any questions you have with your health care provider.

## 2014-10-10 NOTE — Transfer of Care (Signed)
Immediate Anesthesia Transfer of Care Note  Patient: [name]  Procedure(s) Performed: Procedure(s): CYSTOSCOPY, WITH STENT REPLACEMENT, RIGHT URETEROSCOPY  (Bilateral)  Patient Location: PACU  Anesthesia Type:General  Level of Consciousness:  sedated, patient cooperative and responds to stimulation  Airway & Oxygen Therapy:Patient Spontanous Breathing and Patient connected to face mask oxgen  Post-op Assessment:  Report given to PACU RN and Post -op Vital signs reviewed and stable  Post vital signs:  Reviewed and stable  Last Vitals:  Filed Vitals:   10/10/14 0826  BP: 129/70  Pulse: 59  Temp: 36.6 C  Resp: 18    Complications: No apparent anesthesia complications

## 2014-10-10 NOTE — H&P (Signed)
Urology History and Physical Exam  CC: blocked  kidneys   HPI: 81 year old male with a history of recurrent/progressive prostate cancer with bilateral hydronephrosis, returns at this point for cystoscopy and double-J stent extraction.  His history as below:   [place]  In 2005, he was diagnosed with adenocarcinoma with his PSA 4.4. He originally underwent TRUS/Bx on 07/19/2003. Prostatic volume was 22.2 cc. 15% of biopsy tissue on the right and 30% of tissue on the left revealed Gleason 3+3 pattern. 10 cores were taken. He underwent brachytherapy with I-125 seeds on 12/21/2003. He has been followed on a regular basis since that time, and has had an acceptable PSA response, but has had a slow increase in his PSA since his nadir PSA of 0.16 in July, 2010. His PSA slowly increased, and by May, 2015 was 2.89. Unfortunately, he was admitted to the hospital later on in May 2015 with bilateral hydronephrosis and bladder outlet obstruction. This was found secondary to locally advanced prostate cancer. He underwent TUR of his bladder neck/prostate in May, 2015 followed by endoscopic unroofing of ureteral orifice disease and double-J stent placement. Bone scan and CT scans during his hospitalization revealed no evidence of osseous metastatic or intra-abdominal disease. His ARF resolved with stenting and catheter drainage. He received Firmagon while in the hospital, and had subsequent decrease of his PSA from 2.89 down to 0.93 in less than a month. On 07/24/2011, his PSA was down to 0.45.  He was admitted to the hospital in late June, 2015 for a pseudomonal UTI with mild sepsis. That resolved adequately.  At his visit in July, 2015, he underwent cystoscopy and left-sided stent removal. The right double-J stent was not adequately visualized. It turns out that the stent had migrated proximally. He underwent cystoscopy, exchange of a migrated right ureteral stent, left retrograde and placement of a  left double-J stent on September 16, 2013. It was felt that he needed to have long-term stenting, as he had persistent hydrocele on the left following his stent extraction.   With androgen deprivation therapy, his PSA responded initially, with his nadir level, being 0.27 and September, 2015. It has been rising slightly/steadily since that time, and in May, 2015 was 1.41. Bone scan from March, 2016 revealed no evidence of osseous metastatic disease.Marland Kitchen  LUTS:  Patient experienced bladder outlet obstruction, necessitating eventual TUR followed by catheter placement. He no longer has bladder outlet obstruction, but has had overactive bladder symptoms , and recently had an indwelling Foley placed.  BILATERAL HYDRONEPHROSIS  He has bilateral double-J stents for management of bilateral ureteral obstruction secondary to trigonal involvement with his prostate cancer. His stents were last changed in September/2015.he presents this time for cystoscopy and double-J stent replacement   PMH: Past Medical History  Diagnosis Date  . Orthostatic lightheadedness   . History of subdural hematoma     LEFT CHRONIC SUBDURAL HEMATOMA   S/P PARIETAL CRANIOTOMY WITH EVACUATION HEMATOMA  . Atrial fibrillation, persistent   . H/O cardiac radiofrequency ablation     A-FLUTTER  . Cardiac pacemaker last check 08-30-2013    05-05-2007  DD  [employer] GUIDANT INSIGNIA  . RBBB   . Benign essential tremor   . Diverticulosis of colon   . Tachy-brady syndrome   . SSS (sick sinus syndrome)   . Hydronephrosis, bilateral   . Renal insufficiency   . Recurrent prostate carcinoma     2005  S/P RADIOACTIVE SEED IMPLANTS/   BLADDER NECK MASS  RESECTED 05/ 2015  SHOWED RECURRENT PROSTATE CANCER    PSH: Past Surgical History  Procedure Laterality Date  . Tonsillectomy  as child  . Subdural hematoma evacuation via craniotomy  12-16-2007  . Rotator cuff repair w/ distal clavicle excision Left 08-31-2010  . Pacemaker insertion   05-05-2007      Guidant Insignia [employer])  . Total knee arthroplasty  12/18/2010    Procedure: TOTAL KNEE ARTHROPLASTY;  Surgeon: Mauri Pole;  Location: [facility];  Service: Orthopedics;  Laterality: Left;  . Cataract extraction    . Cystoscopy w/ ureteral stent placement Bilateral 05/30/2013    Procedure: CYSTOSCOPY WITH RETROGRADE PYELOGRAM/ BILATERAL URETERAL STENT PLACEMENT, TRANSURETRAL RESECTION OF BLADDER TUMOR WITH GYRUS;  Surgeon: Alexis Frock, MD;  Location: [facility];  Service: Urology;  Laterality: Bilateral;  . Cardioversion N/A 08/04/2013    Procedure: CARDIOVERSION;  Surgeon: Sanda Klein, MD;  Location: [facility];  Service: Cardiovascular;  Laterality: N/A;  . Knee arthroscopy Left 02-11-2007  . Cardioversion  multiple--  last one 08-04-2013    SINCE 2008  . Cardiac electrophysiology mapping and ablation  04-06-2007  dr Caryl Comes  . Cardiac catheterization  12-22-2006      non-obstructive CAD / RCA 20%/  ef 50%/  a-fib  . Radioactive prostate seed implants  12-21-2003  . Orif left distal clavical fx  08-31-2010  . Colonoscopy w/ polypectomy  12-01-2008  . Transthoracic echocardiogram  05-31-2013    MILD LVH/  EF 65%/  MODERATE LAE/  TRIVIAL MR  &  TR  . Cystoscopy/retrograde/ureteroscopy Right 09/16/2013    Procedure: CYSTOSCOPY/RETROGRADE/URETEROSCOPIC STENT EXTRACTION/  bil retrograde pyelogram with inseertion of bilateral stents;  Surgeon: Jorja Loa, MD;  Location: [facility];  Service: Urology;  Laterality: Right;    Allergies: No Known Allergies  Medications: Prescriptions prior to admission  Medication Sig Dispense Refill Last Dose  . calcium citrate-vitamin D (CITRACAL+D) 315-200 MG-UNIT per tablet Take 1 tablet by mouth 2 (two) times daily.   10/01/2014 at Unknown time  . glucosamine-chondroitin 500-400 MG tablet Take 1 tablet by mouth 3 (three) times daily.   10/01/2014 at Unknown time  . levothyroxine (SYNTHROID, LEVOTHROID) 137 MCG  tablet Take 137 mcg by mouth daily before breakfast.   10/01/2014 at Unknown time  . megestrol (MEGACE) 20 MG tablet Take 20 mg by mouth daily.    10/01/2014 at Unknown time  . Multiple Vitamin (MULTIVITAMIN WITH MINERALS) TABS tablet Take 1 tablet by mouth daily.   10/01/2014 at Unknown time  . PRESCRIPTION MEDICATION Supportive therapy    [place]   Taking  . propafenone (RYTHMOL) 225 MG tablet TAKE 1 TABLET (225 MG TOTAL) BY MOUTH 2 (TWO) TIMES DAILY. 60 tablet 6 10/01/2014 at Unknown time  . propranolol (INDERAL) 10 MG tablet Take 10 mg by mouth 2 (two) times daily.   6 10/01/2014 at 2030  . rivaroxaban (XARELTO) 15 MG TABS tablet Take 1 tablet (15 mg total) by mouth daily with supper. (Patient taking differently: Take 15 mg by mouth at bedtime. ) 10 tablet 0 10/01/2014 at 2030  . zolpidem (AMBIEN) 10 MG tablet Take 10 mg by mouth at bedtime as needed for sleep.   1 Past Month at Unknown time  . ciprofloxacin (CIPRO) 500 MG tablet Take 1 tablet (500 mg total) by mouth 2 (two) times daily. (Patient taking differently: Take 500 mg by mouth 2 (two) times daily. Patient started on Cipro 500mg  2times daily on 10/01/2014.) 14 tablet 0   .  donepezil (ARICEPT) 5 MG tablet Take 1 tablet (5 mg total) by mouth at bedtime. Start one tablet with food daily x 1 month then two tablets daily (Patient taking differently: Take 5 mg by mouth at bedtime. ) 30 tablet 0 10/01/2014 at Unknown time  . pramipexole (MIRAPEX) 0.125 MG tablet take 1 tablet by mouth daily  0 10/01/2014 at Unknown time     Social History: Social History   Social History  . Marital Status: Married    Spouse Name: margaret  . Number of Children: 2  . Years of Education: BA   Occupational History  . retired    Social History Main Topics  . Smoking status: Never Smoker   . Smokeless tobacco: Never Used  . Alcohol Use: 12.6 oz/week    7 Glasses of wine, 7 Standard drinks or equivalent, 7 Shots of liquor per week     Comment: 2 glasses wine day   . Drug Use: No  . Sexual Activity: Not on file   Other Topics Concern  . Not on file   Social History Narrative   Patient lives at home with his spouse.   Caffeine Use: 2 cups of coffee    Family History: Family History  Problem Relation Age of Onset  . Aneurysm Father   . Stroke Father     Review of Systems: Positive: recurrent hematuria, difficulty walking, weakness Negative: A further 10 point review of systems was negative except what is listed in the HPI.                  Physical Exam: @VITALS2 @ General: No acute distress.  Awake. Head:  Normocephalic.  Atraumatic. ENT:  EOMI.  Mucous membranes moist Neck:  Supple.  No lymphadenopathy. CV:  S1 present. S2 present. Regular rate. Pulmonary: Equal effort bilaterally.  Clear to auscultation bilaterally. Abdomen: Soft.  noe tender to palpation. Skin:  Normal turgor.  No visible rash. Extremity: No gross deformity of bilateral upper extremities.  No gross deformity of                             lower extremities. Neurologic: Alert. Appropriate mood.    Studies:  No results for input(s): HGB, WBC, PLT in the last 72 hours.  No results for input(s): NA, K, CL, CO2, BUN, CREATININE, CALCIUM, GFRNONAA, GFRAA in the last 72 hours.  Invalid input(s): MAGNESIUM   No results for input(s): INR, APTT in the last 72 hours.  Invalid input(s): PT   Invalid input(s): ABG    Assessment:  Recurrent/progressive adenocarcinoma the prostate.  He has bladder outlet obstruction and incontinence, currently treated with indwelling Foley catheter.  He has bilateral hydronephrosis secondary to distal ureteral obstruction from his prostate cancer, managed with stent placement bilaterally  Plan: Cystoscopy, double-J stent exchange

## 2014-10-13 ENCOUNTER — Telehealth: Payer: Self-pay | Admitting: *Deleted

## 2014-10-13 NOTE — Telephone Encounter (Signed)
Spoke to pt's wife. Set appt w/ Amber. Per TTM, magnet rate is 90bpm indicating ERI.   ROV w/ AS 11/07/14. Spouse will arrange transportation.

## 2014-10-18 ENCOUNTER — Telehealth: Payer: Self-pay | Admitting: Oncology

## 2014-10-18 NOTE — Op Note (Signed)
PATIENT:  [name]  PRE-OPERATIVE DIAGNOSIS: bilateral ureteral obstruction  POST-OPERATIVE DIAGNOSIS: Same  PROCEDURE: cystoscopy, left double-J stent exchange, right ureteroscopy, placement of right double-J stent  SURGEON:  Lillette Boxer. Dahlstedt, M.D.  ANESTHESIA:  General  EBL:  Minimal  DRAINS: bilateral double-J stents  LOCAL MEDICATIONS USED:  None  SPECIMEN:  none  INDICATION: [name] is n64 year old male with progressive prostate cancer and bilateral ureteral obstruction.  This is managed with double-J stent placement.  The patient recently has been incontinent, and this has been managed with Foley catheterization.  It is been almost a year since his stents were changed-he presents for that procedure.  Description of procedure: The patient was properly identified and marked (if applicable) in the holding area. They were then  taken to the operating room and placed on the table in a supine position. General anesthesia was then administered. Once fully anesthetized the patient was moved to the dorsolithotomy position and the genitalia and perineum were sterilely prepped and draped in standard fashion. An official timeout was then performed.  A 23 French cystoscope was advanced under direct vision through his urethra.  Prostate was nonobstructive, somewhat scarred.  No bladder neck contracture noted.  Bladder was entered.  Stents present in the trigone bilaterally-the left was barely peaking out from the ureteral orifice, the right was easily identified.  No obvious bladder lesions were noted although the trigone was somewhat disfigured/irregular from the known prostate cancer.  I grasped the left ureteral stent, and easily brought it out to the urethral meatus.  I was unable to thread a 0.038 inch sensor tip guidewire or a zip wire through the stent, as it was partially obstructed.  I then removed the stent.  Cystoscopically I was able to guide a sensor tip guidewire into  the left ureteral orifice which was somewhat obscured.  However, fluoroscopically this was easily advanced into the upper pole calyces.  I then placed a 7 [place] by 26 cm contour stent with the string removed.  This was placed fluoroscopically and cystoscopically.  Following removal a guidewire, good proximal and distal curls were seen.  I then grasped the right ureteral stent.  This was then brought out through the urethral meatus.  I again was unable to advance a guidewire, either the sensor tip or a zip wire, through the stent due to proximal obstruction.  I then had to remove the stent.  Cystoscopically, it was difficult to identify the ureteral orifice, but eventually this was seen.  Multiple attempts were made both with the zip wire and the sensor tip guidewire to negotiate this guidewire up the ureter.  This was unsuccessful.  After approximately 20-25 minutes, I removed the cystoscope and guided a ureteroscope into the orifice.  A false passage was seen.  This was easily navigated, and the proximal ureter was normal.  I then fed the sensor-tip guidewire through the ureteroscope, and guided up into the upper pole calyces.  The ureteroscope was then removed.  Cystoscopically I then placed a 24 cm x 7 [place] on for double-J stent, with the string removed.  Excellent positioning was seen at this point once the guidewire was removed.  The bladder was drained.  I then placed a 16 French Foley catheter and hooked this to dependent drainage.  The patient was then awakened and taken to the PACU in stable condition.    PLAN OF CARE: Discharge to home after PACU  PATIENT DISPOSITION:  PACU - hemodynamically stable.

## 2014-10-18 NOTE — Telephone Encounter (Signed)
Pt called to r/s due to being at another apt same day, confirmed D/T labs/ov with pt... KJ

## 2014-10-20 ENCOUNTER — Ambulatory Visit (INDEPENDENT_AMBULATORY_CARE_PROVIDER_SITE_OTHER): Payer: PPO | Admitting: Neurology

## 2014-10-20 ENCOUNTER — Ambulatory Visit (INDEPENDENT_AMBULATORY_CARE_PROVIDER_SITE_OTHER): Payer: Self-pay | Admitting: Neurology

## 2014-10-20 ENCOUNTER — Ambulatory Visit: Payer: PPO

## 2014-10-20 ENCOUNTER — Other Ambulatory Visit: Payer: PPO

## 2014-10-20 ENCOUNTER — Encounter: Payer: Self-pay | Admitting: Neurology

## 2014-10-20 DIAGNOSIS — R269 Unspecified abnormalities of gait and mobility: Secondary | ICD-10-CM

## 2014-10-20 DIAGNOSIS — W19XXXA Unspecified fall, initial encounter: Secondary | ICD-10-CM

## 2014-10-20 DIAGNOSIS — R2689 Other abnormalities of gait and mobility: Secondary | ICD-10-CM

## 2014-10-20 NOTE — Procedures (Signed)
     HISTORY:   [name] is an 81 year old gentleman with a history of a relatively sudden decline in his ability to ambulate in the summer of 2016. The patient has severe apraxia of gait, unable to stand or ambulate on his own. The patient is being evaluated for various etiologies of his gait disturbance.  NERVE CONDUCTION STUDIES:  Nerve conduction studies were performed on both lower extremities. The distal motor latencies and motor amplitudes for the peroneal and posterior tibial nerves were within normal limits. The nerve conduction velocities for these nerves were also normal. The H reflex latencies were normal. The sensory latencies for the peroneal nerves were within normal limits.   EMG STUDIES:  EMG study was performed on the right lower extremity:  The tibialis anterior muscle reveals 2 to 4K motor units with full recruitment. No fibrillations or positive waves were seen. The peroneus tertius muscle reveals 2 to 4K motor units with full recruitment. No fibrillations or positive waves were seen. The medial gastrocnemius muscle reveals 1 to 3K motor units with full recruitment. No fibrillations or positive waves were seen. The vastus lateralis muscle reveals 2 to 4K motor units with full recruitment. No fibrillations or positive waves were seen. The iliopsoas muscle reveals 2 to 4K motor units with full recruitment. No fibrillations or positive waves were seen. The biceps femoris muscle (long head) reveals 2 to 4K motor units with full recruitment. No fibrillations or positive waves were seen. The lumbosacral paraspinal muscles were tested at 3 levels, and revealed no abnormalities of insertional activity at all 3 levels tested. There was good relaxation.   IMPRESSION:  Nerve conduction studies done on both lower extremities were unremarkable. No evidence of a peripheral neuropathy was seen. EMG evaluation of the right lower extremity was unremarkable, no evidence of an  overlying lumbosacral radiculopathy was seen.  Jill Alexanders MD 10/20/2014 4:48 PM  [facility] [address] [place], [place] [address]  Phone [phone] Fax [phone]

## 2014-10-20 NOTE — Progress Notes (Signed)
Please refer to EMG and nerve conduction study procedure note. 

## 2014-10-21 ENCOUNTER — Other Ambulatory Visit (HOSPITAL_BASED_OUTPATIENT_CLINIC_OR_DEPARTMENT_OTHER): Payer: PPO

## 2014-10-21 ENCOUNTER — Ambulatory Visit: Payer: PPO

## 2014-10-21 ENCOUNTER — Telehealth: Payer: Self-pay

## 2014-10-21 DIAGNOSIS — N189 Chronic kidney disease, unspecified: Secondary | ICD-10-CM

## 2014-10-21 DIAGNOSIS — C61 Malignant neoplasm of prostate: Secondary | ICD-10-CM | POA: Diagnosis not present

## 2014-10-21 DIAGNOSIS — D631 Anemia in chronic kidney disease: Secondary | ICD-10-CM

## 2014-10-21 LAB — CBC WITH DIFFERENTIAL/PLATELET
BASO%: 1 % (ref 0.0–2.0)
Basophils Absolute: 0.1 10*3/uL (ref 0.0–0.1)
EOS%: 6.3 % (ref 0.0–7.0)
Eosinophils Absolute: 0.3 10*3/uL (ref 0.0–0.5)
HCT: 34.6 % — ABNORMAL LOW (ref 38.4–49.9)
HGB: 11.2 g/dL — ABNORMAL LOW (ref 13.0–17.1)
LYMPH%: 22.9 % (ref 14.0–49.0)
MCH: 29 pg (ref 27.2–33.4)
MCHC: 32.3 g/dL (ref 32.0–36.0)
MCV: 89.9 fL (ref 79.3–98.0)
MONO#: 0.3 10*3/uL (ref 0.1–0.9)
MONO%: 6.3 % (ref 0.0–14.0)
NEUT%: 63.5 % (ref 39.0–75.0)
NEUTROS ABS: 3.5 10*3/uL (ref 1.5–6.5)
Platelets: 280 10*3/uL (ref 140–400)
RBC: 3.85 10*6/uL — AB (ref 4.20–5.82)
RDW: 16.4 % — ABNORMAL HIGH (ref 11.0–14.6)
WBC: 5.5 10*3/uL (ref 4.0–10.3)
lymph#: 1.3 10*3/uL (ref 0.9–3.3)

## 2014-10-21 NOTE — Progress Notes (Signed)
Pt's labs noted to be 11.2 and 34.6. No injection needed today. Pt instructed to keep up coming appointments. Copy of labs and appointments given to patient and pt's wife. Both verbalized understanding of instructions.

## 2014-10-21 NOTE — Telephone Encounter (Signed)
Rn notified [name] patients wife that her husbands nerve conduction test study was normal. Pts wife understood her husbands results and was happy with the results and had no more questions.

## 2014-10-25 ENCOUNTER — Other Ambulatory Visit: Payer: Self-pay | Admitting: Neurology

## 2014-11-07 ENCOUNTER — Encounter: Payer: Self-pay | Admitting: *Deleted

## 2014-11-07 ENCOUNTER — Encounter: Payer: PPO | Admitting: Nurse Practitioner

## 2014-11-07 NOTE — Progress Notes (Signed)
This encounter was created in error - please disregard.

## 2014-11-14 ENCOUNTER — Telehealth: Payer: Self-pay | Admitting: Internal Medicine

## 2014-11-14 ENCOUNTER — Telehealth: Payer: Self-pay | Admitting: Neurology

## 2014-11-14 NOTE — Telephone Encounter (Signed)
Called patient to let him know that we did have samples for him and he could come pick them up at the check in desk.

## 2014-11-14 NOTE — Telephone Encounter (Signed)
New message     Patient calling the office for samples of medication:   1.  What medication and dosage are you requesting samples for? xarelto 15 mg once a day   2.  Are you currently out of this medication?  Yes   3. Are you requesting samples to get you through until you receive your prescription? Yes

## 2014-11-14 NOTE — Telephone Encounter (Signed)
Rn call patient back about his pain in his hips and tension in his back. Pt stated sometimes when he is brushing his teeth, or eating breakfast he feels tension in his hips and back. Pt stated he does not do a lot of walking. Rn explain to patient that Dr.Sethi does not do pain controlled. Pt stated he understands. Patient also stated that the tension comes and goes in his hips and lower back, and it can happen anytime of the day. There is no specific thing that triggers it. Rn stated Dr.Sethi will be sent a message and will discuss the issue wit him and his wife.

## 2014-11-14 NOTE — Telephone Encounter (Signed)
I spoke to the patient and discuss his tension in his back which appears to be related to activities like brushing teeth and rstanding and I think is musculoskeletal in nature. He was recently had CT scan of the spine followed by myelogram which did not show significant evidence of impingement of spinal cord or nerve roots. He had nerve conduction study done by Dr. Jannifer Franklin on 10/18/14 which was also unremarkable. I reviewed the results of about test with the patient and answered questions. I recommend he do regular back stretching exercises and if his symptoms persist see his primary care physician for further evaluation for this.

## 2014-11-14 NOTE — Telephone Encounter (Signed)
Pt's wife called sts pt is having pain in bil hips, hips are moving involuntarily. She sts it's hard to explain. Please call and advise at [phone]

## 2014-11-16 ENCOUNTER — Ambulatory Visit (HOSPITAL_BASED_OUTPATIENT_CLINIC_OR_DEPARTMENT_OTHER): Payer: PPO

## 2014-11-16 ENCOUNTER — Ambulatory Visit (HOSPITAL_BASED_OUTPATIENT_CLINIC_OR_DEPARTMENT_OTHER): Payer: PPO | Admitting: Oncology

## 2014-11-16 ENCOUNTER — Telehealth: Payer: Self-pay | Admitting: Oncology

## 2014-11-16 ENCOUNTER — Other Ambulatory Visit (HOSPITAL_BASED_OUTPATIENT_CLINIC_OR_DEPARTMENT_OTHER): Payer: PPO

## 2014-11-16 VITALS — BP 127/72 | HR 59 | Temp 97.7°F | Resp 18 | Ht 72.0 in

## 2014-11-16 DIAGNOSIS — I4891 Unspecified atrial fibrillation: Secondary | ICD-10-CM | POA: Diagnosis not present

## 2014-11-16 DIAGNOSIS — N189 Chronic kidney disease, unspecified: Secondary | ICD-10-CM

## 2014-11-16 DIAGNOSIS — D631 Anemia in chronic kidney disease: Secondary | ICD-10-CM

## 2014-11-16 DIAGNOSIS — C61 Malignant neoplasm of prostate: Secondary | ICD-10-CM | POA: Diagnosis not present

## 2014-11-16 DIAGNOSIS — N179 Acute kidney failure, unspecified: Secondary | ICD-10-CM

## 2014-11-16 DIAGNOSIS — R319 Hematuria, unspecified: Secondary | ICD-10-CM

## 2014-11-16 LAB — CBC WITH DIFFERENTIAL/PLATELET
BASO%: 0.3 % (ref 0.0–2.0)
Basophils Absolute: 0 10*3/uL (ref 0.0–0.1)
EOS%: 0 % (ref 0.0–7.0)
Eosinophils Absolute: 0 10*3/uL (ref 0.0–0.5)
HCT: 33.9 % — ABNORMAL LOW (ref 38.4–49.9)
HGB: 10.9 g/dL — ABNORMAL LOW (ref 13.0–17.1)
LYMPH%: 12.3 % — AB (ref 14.0–49.0)
MCH: 28.3 pg (ref 27.2–33.4)
MCHC: 32.1 g/dL (ref 32.0–36.0)
MCV: 88.4 fL (ref 79.3–98.0)
MONO#: 0.1 10*3/uL (ref 0.1–0.9)
MONO%: 1.4 % (ref 0.0–14.0)
NEUT#: 4.8 10*3/uL (ref 1.5–6.5)
NEUT%: 86 % — AB (ref 39.0–75.0)
Platelets: 261 10*3/uL (ref 140–400)
RBC: 3.83 10*6/uL — AB (ref 4.20–5.82)
RDW: 15.9 % — ABNORMAL HIGH (ref 11.0–14.6)
WBC: 5.6 10*3/uL (ref 4.0–10.3)
lymph#: 0.7 10*3/uL — ABNORMAL LOW (ref 0.9–3.3)

## 2014-11-16 LAB — COMPREHENSIVE METABOLIC PANEL (CC13)
ALBUMIN: 3.9 g/dL (ref 3.5–5.0)
ALK PHOS: 77 U/L (ref 40–150)
ALT: 18 U/L (ref 0–55)
AST: 16 U/L (ref 5–34)
Anion Gap: 9 mEq/L (ref 3–11)
BUN: 32.1 mg/dL — AB (ref 7.0–26.0)
CO2: 19 meq/L — AB (ref 22–29)
Calcium: 10 mg/dL (ref 8.4–10.4)
Chloride: 106 mEq/L (ref 98–109)
Creatinine: 1.7 mg/dL — ABNORMAL HIGH (ref 0.7–1.3)
EGFR: 38 mL/min/{1.73_m2} — AB (ref >90)
GLUCOSE: 169 mg/dL — AB (ref 70–140)
POTASSIUM: 4.7 meq/L (ref 3.5–5.1)
SODIUM: 135 meq/L — AB (ref 136–145)
TOTAL PROTEIN: 7.3 g/dL (ref 6.4–8.3)
Total Bilirubin: 0.34 mg/dL (ref 0.20–1.20)

## 2014-11-16 MED ORDER — DARBEPOETIN ALFA 300 MCG/0.6ML IJ SOSY
300.0000 ug | PREFILLED_SYRINGE | Freq: Once | INTRAMUSCULAR | Status: AC
  Administered 2014-11-16: 300 ug via SUBCUTANEOUS
  Filled 2014-11-16: qty 0.6

## 2014-11-16 NOTE — Telephone Encounter (Signed)
per pof to sch pt appt-gave pt copy of avs-gave contrast °

## 2014-11-16 NOTE — Patient Instructions (Signed)
Darbepoetin Alfa injection What is this medicine? DARBEPOETIN ALFA (dar be POE e tin AL fa) helps your body make more red blood cells. It is used to treat anemia caused by chronic kidney failure and chemotherapy. This medicine may be used for other purposes; ask your health care provider or pharmacist if you have questions. COMMON BRAND NAME(S): Aranesp What should I tell my health care provider before I take this medicine? They need to know if you have any of these conditions: -blood clotting disorders or history of blood clots -cancer patient not on chemotherapy -cystic fibrosis -heart disease, such as angina, heart failure, or a history of a heart attack -hemoglobin level of 12 g/dL or greater -high blood pressure -low levels of folate, iron, or vitamin B12 -seizures -an unusual or allergic reaction to darbepoetin, erythropoietin, albumin, hamster proteins, latex, other medicines, foods, dyes, or preservatives -pregnant or trying to get pregnant -breast-feeding How should I use this medicine? This medicine is for injection into a vein or under the skin. It is usually given by a health care professional in a hospital or clinic setting. If you get this medicine at home, you will be taught how to prepare and give this medicine. Do not shake the solution before you withdraw a dose. Use exactly as directed. Take your medicine at regular intervals. Do not take your medicine more often than directed. It is important that you put your used needles and syringes in a special sharps container. Do not put them in a trash can. If you do not have a sharps container, call your pharmacist or healthcare provider to get one. Talk to your pediatrician regarding the use of this medicine in children. While this medicine may be used in children as young as 1 year for selected conditions, precautions do apply. Overdosage: If you think you have taken too much of this medicine contact a poison control center or  emergency room at once. NOTE: This medicine is only for you. Do not share this medicine with others. What if I miss a dose? If you miss a dose, take it as soon as you can. If it is almost time for your next dose, take only that dose. Do not take double or extra doses. What may interact with this medicine? Do not take this medicine with any of the following medications: -epoetin alfa This list may not describe all possible interactions. Give your health care provider a list of all the medicines, herbs, non-prescription drugs, or dietary supplements you use. Also tell them if you smoke, drink alcohol, or use illegal drugs. Some items may interact with your medicine. What should I watch for while using this medicine? Visit your prescriber or health care professional for regular checks on your progress and for the needed blood tests and blood pressure measurements. It is especially important for the doctor to make sure your hemoglobin level is in the desired range, to limit the risk of potential side effects and to give you the best benefit. Keep all appointments for any recommended tests. Check your blood pressure as directed. Ask your doctor what your blood pressure should be and when you should contact him or her. As your body makes more red blood cells, you may need to take iron, folic acid, or vitamin B supplements. Ask your doctor or health care provider which products are right for you. If you have kidney disease continue dietary restrictions, even though this medication can make you feel better. Talk with your doctor or health   care professional about the foods you eat and the vitamins that you take. What side effects may I notice from receiving this medicine? Side effects that you should report to your doctor or health care professional as soon as possible: -allergic reactions like skin rash, itching or hives, swelling of the face, lips, or tongue -breathing problems -changes in vision -chest  pain -confusion, trouble speaking or understanding -feeling faint or lightheaded, falls -high blood pressure -muscle aches or pains -pain, swelling, warmth in the leg -rapid weight gain -severe headaches -sudden numbness or weakness of the face, arm or leg -trouble walking, dizziness, loss of balance or coordination -seizures (convulsions) -swelling of the ankles, feet, hands -unusually weak or tired Side effects that usually do not require medical attention (report to your doctor or health care professional if they continue or are bothersome): -diarrhea -fever, chills (flu-like symptoms) -headaches -nausea, vomiting -redness, stinging, or swelling at site where injected This list may not describe all possible side effects. Call your doctor for medical advice about side effects. You may report side effects to FDA at 1-800-FDA-1088. Where should I keep my medicine? Keep out of the reach of children. Store in a refrigerator between 2 and 8 degrees C (36 and 46 degrees F). Do not freeze. Do not shake. Throw away any unused portion if using a single-dose vial. Throw away any unused medicine after the expiration date. NOTE: This sheet is a summary. It may not cover all possible information. If you have questions about this medicine, talk to your doctor, pharmacist, or health care provider.  2015, Elsevier/Gold Standard. (2007-12-15 10:23:57)  

## 2014-11-16 NOTE — Progress Notes (Signed)
Hematology and Oncology Follow Up Visit  [name] [id number] 03/27/1931 81 y.o. 11/16/2014 10:48 AM STONEKING,HAL THOMAS, MDStoneking, Hal, MD   Principle Diagnosis: 81-year-old gentleman with: 1. multifactorial anemia he has an element of anemia of chronic disease, renal insufficiency and iron deficiency.   2.Prostate cancer diagnosed in 2005, Gleason score 3+3 = 6 with a PSA of 4.4. He is developing castration resistant disease. His disease has been local without any evidence of metastasis at this point.  3. Weakness, gait disturbance and inability to ambulate. Follows up with neurology with multifactorial etiology.   Prior Therapy:  1. He was treated with brachytherapy and seed implants and 12/21/2003. His PSA did rise up to 2.89 and found to have local recurrence of his prostate cancer into the bladder neck. He underwent a double J stent placement and his staging workup did not show any widespread metastasis. He was treated with androgen deprivation with Firmagon and his PSA dropped down to 0.93. He is currently receiving Lupron every 4 months under the care of Dr. Dahlstedt.   He is status post bone marrow biopsy done on 06/16/2014 that showed no evidence of malignancy and scarce iron stores.   Current therapy:   Aranesp 300 g every 3 weeks to keep his hemoglobin above 11. He also will receive IV iron as well as packed red cell transfusion as needed.  Lupron every 4 months with Dr. Dahlstedt with his last PSA is 4.74 in 09/29/2014.  Interim History:  [name] presents today for a follow-up visit. Since the last visit, he reports some improvements in his overall health and quality of life. He is currently back at home and receiving physical therapy at home. With assistance, he is able to ambulate short distances with the help of a walker. Has not reported any falls or syncope.  He continues to have an indwelling Foley catheter and underwent a cystoscopy and stent replacement  on 10/10/2014. He continues to have light hematuria which has improved in recent times. His appetite remains reasonable and his quality of life although poor have not declined further.    He does not report any fevers, chills, sweats but does report weight loss and decline in his appetite. He does not report any chest pain, palpitation, orthopnea or PND. He does not report any leg edema, cough, shortness of breath, difficulty breathing but does report occasional dyspnea on exertion. He does not report any nausea, vomiting, constipation or diarrhea. He does not report any frequency urgency or hesitancy. He does not report any mood issues such as anxiety or depression. Rest of his review of systems unremarkable.   Medications: I have reviewed the patient's current medications.  Current Outpatient Prescriptions  Medication Sig Dispense Refill  . calcium citrate-vitamin D (CITRACAL+D) 315-200 MG-UNIT per tablet Take 1 tablet by mouth 2 (two) times daily.    . donepezil (ARICEPT) 5 MG tablet TAKE 1 TABLET BY MOUTH AT BEDTIME. START WITH 1 TAB WITH FOOD DAILY FOR 1 MONTH THEN 2 TABS DAILY 60 tablet 3  . glucosamine-chondroitin 500-400 MG tablet Take 1 tablet by mouth 3 (three) times daily.    . levothyroxine (SYNTHROID, LEVOTHROID) 137 MCG tablet Take 137 mcg by mouth daily before breakfast.    . megestrol (MEGACE) 20 MG tablet Take 20 mg by mouth daily.     . Multiple Vitamin (MULTIVITAMIN WITH MINERALS) TABS tablet Take 1 tablet by mouth daily.    . pramipexole (MIRAPEX) 0.125 MG tablet take 1 tablet by   mouth daily  0  . PRESCRIPTION MEDICATION Supportive therapy    [facility]    . propafenone (RYTHMOL) 225 MG tablet TAKE 1 TABLET (225 MG TOTAL) BY MOUTH 2 (TWO) TIMES DAILY. 60 tablet 6  . propranolol (INDERAL) 10 MG tablet Take 10 mg by mouth 2 (two) times daily.   6  . rivaroxaban (XARELTO) 15 MG TABS tablet Take 1 tablet (15 mg total) by mouth daily with supper. (Patient taking differently: Take 15 mg by  mouth at bedtime. ) 10 tablet 0  . sulfamethoxazole-trimethoprim (BACTRIM DS,SEPTRA DS) 800-160 MG per tablet Take 1 tablet by mouth 2 (two) times daily. 6 tablet 0  . zolpidem (AMBIEN) 10 MG tablet Take 10 mg by mouth at bedtime as needed for sleep.   1   No current facility-administered medications for this visit.     Allergies: No Known Allergies  Past Medical History, Surgical history, Social history, and Family History were reviewed and updated.  Physical Exam: Blood pressure 127/72, pulse 59, temperature 97.7 F (36.5 [name]), temperature source Oral, resp. rate 18, height 6' (1.829 m), SpO2 100 %. ECOG: 1 General appearance: alert and cooperative. Chronically ill-appearing without distress today. Head: Normocephalic, without obvious abnormality no oral ulcers or lesions. Neck: no adenopathy Lymph nodes: Cervical, supraclavicular, and axillary nodes normal. Heart:regular rate and rhythm, S1, S2 normal, no murmur, click, rub or gallop Lung:chest clear, no wheezing, rales, normal symmetric air entry Abdomin: soft, non-tender, without masses or organomegaly no ascites. EXT:no erythema, induration, or nodules no edema.   Lab Results: Lab Results  Component Value Date   WBC 5.6 11/16/2014   HGB 10.9* 11/16/2014   HCT 33.9* 11/16/2014   MCV 88.4 11/16/2014   PLT 261 11/16/2014     Chemistry      Component Value Date/Time   NA 134* 10/01/2014 2247   NA 137 01/27/2014 1426   K 3.9 10/01/2014 2247   K 4.2 01/27/2014 1426   CL 107 10/01/2014 2247   CO2 19* 10/01/2014 2247   CO2 21* 01/27/2014 1426   BUN 30* 10/01/2014 2247   BUN 35.5* 01/27/2014 1426   CREATININE 1.62* 10/01/2014 2247   CREATININE 1.7* 01/27/2014 1426      Component Value Date/Time   CALCIUM 8.6* 10/01/2014 2247   CALCIUM 9.1 01/27/2014 1426   ALKPHOS 57 10/01/2014 2247   ALKPHOS 71 01/27/2014 1426   AST 18 10/01/2014 2247   AST 15 01/27/2014 1426   ALT 12* 10/01/2014 2247   ALT 8 01/27/2014 1426    BILITOT 0.6 10/01/2014 2247   BILITOT 0.38 01/27/2014 1426     F Results for [name], [name] (MRN [id number]) as of 11/16/2014 10:23  Ref. Range 05/29/2013 20:05 09/29/2014 11:39  PSA Latest Ref Range: <=4.00 ng/mL 2.59 4.74 (H)    INAL D Impression and Plan:   81-year-old gentleman with the following issues:  1. Multifactorial anemia. His anemia is normocytic and have been fluctuating at least for the last 6 months. His erythropoietin is inappropriately elevated at this time. Bone marrow biopsy did not show any evidence of myelodysplasia or leukemia. He has element of anemia of renal disease as the major contributor. His iron stores have been replaced with Feraheme as well.  His hemoglobin today appears adequate and does not require packed results transfusion. He will not receive Aranesp today but will continue every 3 weeks to keep his hemoglobin above 11.   2. Prostate cancer: He did develop locally advanced hormone sensitive prostate   cancer and currently on Lupron. His PSA slowly rising up to 4.74. The plan is to restage him with a bone scan and a CT scan in December 2016. Second line hormonal therapy options were reviewed today and it would be reasonable to consider those options for him given his stable clinical condition. These options would include Zytiga, xtandi among others. Do not think he is a candidate for systemic chemotherapy but certainly a possibility down the line.  3. Atrial fibrillation: He is currently in normal sinus rhythm but chronically anticoagulated on Xarelto which could be contributing to havehematuria chronic blood loss. He is a regular rate and rhythm at this time.  4. Renal insufficiency: His creatinine close to 1.6 and continues to improve with the help of stent placement.  5. Multifactorial gait disturbance and weakness: He is currently under evaluation by neurology. No clear-cut etiology has been identified. I doubt this is a paraneoplastic manifestation of  his prostate cancer.  6. Hematuria: Seems to be improving slowly at this time following his to cystoscopy on 10/10/2014.  7. Follow-up: Will be in 3 weeks for Aranesp injection and in 6 weeks after imaging studies.      SHADAD,FIRAS, MD 11/2/201610:48 AM 

## 2014-11-17 LAB — PSA: PSA: 5.03 ng/mL — AB (ref <=4.00)

## 2014-11-21 ENCOUNTER — Telehealth: Payer: Self-pay | Admitting: Neurology

## 2014-11-21 NOTE — Telephone Encounter (Addendum)
Rn call [facility] back about about patient taking Myrbetriq medication and would it be safe, Rn ask for Anderson Malta but she was unavailable. Rn told Shirlean Mylar that Dr.Sethi is not the PCP, and the PCP name was given for them to contact. Rn explain that Dr.Sethi only prescribed Aricept for memory,and did not prescribed the other medications. Rn gave

## 2014-11-21 NOTE — Telephone Encounter (Signed)
Rn unable to leave vm was on hold for 5 minutes.If Anderson Malta calls back from office.  Rn call Anderson Malta back about Myrbetriq medication. Rn look at patients med list and Dr.Sethi only prescribed aricpet.Rn was on hold for 5 min and no one answer the phone.Anderson Malta from the urology office will have to call the patients PCP.

## 2014-11-21 NOTE — Telephone Encounter (Signed)
Anderson Malta is calling to ask if the medication Myrbetriq would be safe for the patient to take for an overactive bladder with the other medications he is currently taking. Please call.

## 2014-11-28 NOTE — Progress Notes (Signed)
Cardiology Office Note Date:  11/29/2014  Patient ID:  Kenn, Schnoor November 11, 1931, MRN [id number] PCP:  [name], MD  Cardiologist:   Electrophysiologist: Dr. Caryl Comes Oncology: Dr. Alen Blew Neurology: Dr. Leonie Man   Chief Complaint:  Device is at [facility], 10/13/14 based on TTM magnet rate of 90  History of Present Illness: KAIMEN BRUSS is a 81 y.o. male with history of paroxysmal atrial fibrillation, PPM, benign tremor, CRI, history of prostate cancer, hematuria follows with oncology/hematology with Aranesp therapy,  and urology s/p cystscopy in May 2015 with ressection of a bladder tumor b/l urethral stents most recently in September with an indwelling foley catheter.  He has been having progressive gait disturbance that he is seeing neurology for this, though no clear etiology has been found and he is basically wheelchair bound at this time.  He has history of suspect Flecainide toxicity June 2015 and his dose decreased, during that time,  he underwent DCCV July 2015.  He also has hx of an Aflutter ablation with Dr. Caryl Comes very remotely in 2009, no obstructive CAD by cath in 2008.  Today he feels fairly well given his multiple medical problems, he denies any kind of CP, no palpitations, or SOB, no dizziness, near syncope or syncope.  His near fall this morning was associated with  LE weakness, not dizziness or syncope.   Past Medical History  Diagnosis Date  . Orthostatic lightheadedness   . History of subdural hematoma     LEFT CHRONIC SUBDURAL HEMATOMA   S/P PARIETAL CRANIOTOMY WITH EVACUATION HEMATOMA  . Atrial fibrillation, persistent ([place])   . H/O cardiac radiofrequency ablation     A-FLUTTER  . Cardiac pacemaker last check 08-30-2013    05-05-2007  DD  MEDTRONIC GUIDANT INSIGNIA  . RBBB   . Benign essential tremor   . Diverticulosis of colon   . Tachy-brady syndrome ([place])   . SSS (sick sinus syndrome) ([place])   . Hydronephrosis, bilateral   . Renal insufficiency   .  Recurrent prostate carcinoma ([place])     2005  S/P RADIOACTIVE SEED IMPLANTS/   BLADDER NECK MASS RESECTED 05/ 2015  SHOWED RECURRENT PROSTATE CANCER    Past Surgical History  Procedure Laterality Date  . Tonsillectomy  as child  . Subdural hematoma evacuation via craniotomy  12-16-2007  . Rotator cuff repair w/ distal clavicle excision Left 08-31-2010  . Pacemaker insertion  05-05-2007      Guidant Insignia [employer])  . Total knee arthroplasty  12/18/2010    Procedure: TOTAL KNEE ARTHROPLASTY;  Surgeon: Mauri Pole;  Location: [facility];  Service: Orthopedics;  Laterality: Left;  . Cataract extraction    . Cystoscopy w/ ureteral stent placement Bilateral 05/30/2013    Procedure: CYSTOSCOPY WITH RETROGRADE PYELOGRAM/ BILATERAL URETERAL STENT PLACEMENT, TRANSURETRAL RESECTION OF BLADDER TUMOR WITH GYRUS;  Surgeon: Alexis Frock, MD;  Location: [facility];  Service: Urology;  Laterality: Bilateral;  . Cardioversion N/A 08/04/2013    Procedure: CARDIOVERSION;  Surgeon: Sanda Klein, MD;  Location: [facility];  Service: Cardiovascular;  Laterality: N/A;  . Knee arthroscopy Left 02-11-2007  . Cardioversion  multiple--  last one 08-04-2013    SINCE 2008  . Cardiac electrophysiology mapping and ablation  04-06-2007  dr Caryl Comes  . Cardiac catheterization  12-22-2006      non-obstructive CAD / RCA 20%/  ef 50%/  a-fib  . Radioactive prostate seed implants  12-21-2003  . Orif left distal clavical fx  08-31-2010  . Colonoscopy w/  polypectomy  12-01-2008  . Transthoracic echocardiogram  05-31-2013    MILD LVH/  EF 65%/  MODERATE LAE/  TRIVIAL MR  &  TR  . Cystoscopy/retrograde/ureteroscopy Right 09/16/2013    Procedure: CYSTOSCOPY/RETROGRADE/URETEROSCOPIC STENT EXTRACTION/  bil retrograde pyelogram with inseertion of bilateral stents;  Surgeon: Jorja Loa, MD;  Location: [facility];  Service: Urology;  Laterality: Right;  . Cystoscopy w/ ureteral stent placement Bilateral  10/10/2014    Procedure: CYSTOSCOPY, WITH STENT REPLACEMENT, RIGHT URETEROSCOPY ;  Surgeon: Franchot Gallo, MD;  Location: [facility];  Service: Urology;  Laterality: Bilateral;    Current Outpatient Prescriptions  Medication Sig Dispense Refill  . donepezil (ARICEPT) 5 MG tablet TAKE 1 TABLET BY MOUTH AT BEDTIME. START WITH 1 TAB WITH FOOD DAILY FOR 1 MONTH THEN 2 TABS DAILY 60 tablet 3  . glucosamine-chondroitin 500-400 MG tablet Take 1 tablet by mouth daily.     Marland Kitchen levothyroxine (SYNTHROID, LEVOTHROID) 137 MCG tablet Take 137 mcg by mouth daily before breakfast.    . megestrol (MEGACE) 20 MG tablet Take 20 mg by mouth daily.     . Multiple Vitamin (MULTIVITAMIN WITH MINERALS) TABS tablet Take 1 tablet by mouth daily.    . pramipexole (MIRAPEX) 0.125 MG tablet take 1 tablet by mouth daily  0  . PRESCRIPTION MEDICATION Supportive therapy    [facility]    . propafenone (RYTHMOL) 225 MG tablet TAKE 1 TABLET (225 MG TOTAL) BY MOUTH 2 (TWO) TIMES DAILY. 60 tablet 6  . propranolol (INDERAL) 10 MG tablet Take 10 mg by mouth 2 (two) times daily.   6  . rivaroxaban (XARELTO) 15 MG TABS tablet Take 1 tablet (15 mg total) by mouth daily with supper. (Patient taking differently: Take 15 mg by mouth at bedtime. ) 10 tablet 0  . zolpidem (AMBIEN) 10 MG tablet Take 10 mg by mouth at bedtime as needed for sleep.   1   No current facility-administered medications for this visit.    Allergies:   Review of patient's allergies indicates no known allergies.   Social History:  The patient  reports that he has never smoked. He has never used smokeless tobacco. He reports that he drinks about 12.6 oz of alcohol per week. He reports that he does not use illicit drugs.   Family History:  The patient's family history includes Aneurysm in his father; Stroke in his father.  ROS:  Please see the history of present illness.  All other systems are reviewed and otherwise negative.   PHYSICAL EXAM:  VS:  BP 98/74 mmHg  Pulse  60  Ht 6' (1.829 m)  Wt  BMI: There is no weight on file to calculate BMI.  Thin, elderly male in no acute distress HEENT: normocephalic, atraumatic Neck: no JVD, carotid bruits or masses Cardiac:  normal S1, S2; RRR; no significant murmurs, no rubs, or gallops Lungs:  clear to auscultation bilaterally, no wheezing, rhonchi or rales Abd: soft, nontender,  + BS MS: no deformity or atrophy Ext: no edema Skin: warm and dry, no rash Neuro:  No gross deficits appreciated Psych: euthymic mood, full affect  PPM site is stable, no tethering or discomfort   EKG:  Done today shows atrial paced, RBBB  05/31/13: Echocardiogram Study Conclusions - Left ventricle: The cavity size was normal. Wall thickness was increased in a pattern of mild LVH. The estimated ejection fraction was 65%. Wall motion was normal; there were no regional wall motion abnormalities. - Left atrium:  The atrium was moderately dilated. - Right ventricle: The cavity size was mildly dilated. Systolic function was mildly reduced. - Pulmonary arteries: PA peak pressure: 28 mm Hg (S).  Recent Labs: 11/16/2014: ALT 18; BUN 32.1*; Creatinine 1.7*; HGB 10.9*; Platelets 261; Potassium 4.7; Sodium 135*  10/21/14 H/H 11.2/34.6    Wt Readings from Last 3 Encounters:  10/10/14 167 lb (75.751 kg)  10/03/14 167 lb (75.751 kg)  10/01/14 175 lb (79.379 kg)    Patient not weighed today, unable to stand  Other studies reviewed: Additional studies/records reviewed today include past test results, office visits, records as above  DEVICE INFORMATION: [employer], implanted 05/05/07  ASSESSMENT AND PLAN:  1. PPM is not at ERI, checked by [employer], magnet rate is at 90 estimating < 6 months, ER is magnet rate of 85 We will plan for TTM monthly to monitor this  2. Sick sinus syndrome, Paroxysmal AFib Rythmol and Xarelto renal dose, Creat clearance 36.51 by his last known weight in September and his  last Creat from this month         He had an episode this morning where he was unable to maintain standing in the BR and she was abe to help him to the floor without a fall.           Dr. Caryl Comes had a lengthy discussion today regarding his Xarelto, his progressed gait instability (now basically wheelchair bound though will use walker for short distances with help), his anemia, hematuria and some consideration of stopping the Xarelto.  At this time, the patient feels most comfortable continuing the Xarelto for stroke prevention, being his bigger fear, then as bleeding event.  They are instructed to maintain safety with use of his wheelchair, precautions post BM, and steps to avoid falls.  3. Anemia, hematuria History of prostate cancer, felt to be multifactorial, follows with oncology and urology He will continue with his oncologist/hematologist anad urology  4. Progressive gait disturbance Following with neurology  Disposition: F/u with monthly TTM monitoring of his PPM, the patient's wife states she will contact the TTM company to u[pdate to monthly checks.  3 month office visit, sooner if needed.  Current medicines are reviewed at length with the patient today.  The patient did not have any concerns regarding medicines.  Haywood Lasso, PA-C 11/29/2014 3:08 PM     [address] [place] [id number] [phone] (office)  [phone] (fax)

## 2014-11-29 ENCOUNTER — Ambulatory Visit (INDEPENDENT_AMBULATORY_CARE_PROVIDER_SITE_OTHER): Payer: PPO | Admitting: Physician Assistant

## 2014-11-29 ENCOUNTER — Encounter: Payer: Self-pay | Admitting: Physician Assistant

## 2014-11-29 ENCOUNTER — Encounter: Payer: Self-pay | Admitting: *Deleted

## 2014-11-29 VITALS — BP 98/74 | HR 60 | Ht 72.0 in

## 2014-11-29 DIAGNOSIS — I442 Atrioventricular block, complete: Secondary | ICD-10-CM

## 2014-11-29 DIAGNOSIS — I495 Sick sinus syndrome: Secondary | ICD-10-CM

## 2014-11-29 DIAGNOSIS — I4891 Unspecified atrial fibrillation: Secondary | ICD-10-CM

## 2014-11-29 DIAGNOSIS — D649 Anemia, unspecified: Secondary | ICD-10-CM

## 2014-11-29 NOTE — Patient Instructions (Signed)
Medication Instructions:   CONTINUE ON THE SAME MEDICINES AS PRESCRIBED   If you need a refill on your cardiac medications before your next appointment, please call your pharmacy.  Labwork: NONE ORDER TODAY    Testing/Procedures:  NONE ORDER TODAY    Follow-Up:  IN 3  MONTHS WITH  AN AVAILABLE APP   Remote monitoring is used to monitor your Pacemaker of ICD from home. This monitoring reduces the number of office visits required to check your device to one time per year. It allows [place] to keep an eye on the functioning of your device to ensure it is working properly. You are scheduled for a device check from home on .12 /15/16  You may send your transmission at any time that day. If you have a wireless device, the transmission will be sent automatically. After your physician reviews your transmission, you will receive a postcard with your next transmission date. CONTACT COMPANY TO MAKE SURE THEY HAVE INFORMATION FOR HEART CARE CLINIC TO DO MONHTLY TRANSMISSIONS HERE.Marland Kitchen     Any Other Special Instructions Will Be Listed Below (If Applicable).

## 2014-12-07 ENCOUNTER — Other Ambulatory Visit (HOSPITAL_BASED_OUTPATIENT_CLINIC_OR_DEPARTMENT_OTHER): Payer: PPO

## 2014-12-07 ENCOUNTER — Ambulatory Visit (HOSPITAL_BASED_OUTPATIENT_CLINIC_OR_DEPARTMENT_OTHER): Payer: PPO

## 2014-12-07 ENCOUNTER — Encounter (HOSPITAL_COMMUNITY): Payer: Self-pay | Admitting: Emergency Medicine

## 2014-12-07 ENCOUNTER — Inpatient Hospital Stay (HOSPITAL_COMMUNITY)
Admission: EM | Admit: 2014-12-07 | Discharge: 2014-12-09 | DRG: 684 | Disposition: A | Discharge status: Home Health | Payer: PPO | Attending: Internal Medicine | Admitting: Internal Medicine

## 2014-12-07 ENCOUNTER — Inpatient Hospital Stay (HOSPITAL_COMMUNITY): Payer: PPO

## 2014-12-07 VITALS — BP 73/46 | HR 60 | Temp 97.9°F

## 2014-12-07 DIAGNOSIS — N189 Chronic kidney disease, unspecified: Secondary | ICD-10-CM | POA: Diagnosis present

## 2014-12-07 DIAGNOSIS — E860 Dehydration: Secondary | ICD-10-CM | POA: Diagnosis present

## 2014-12-07 DIAGNOSIS — Z96652 Presence of left artificial knee joint: Secondary | ICD-10-CM | POA: Diagnosis present

## 2014-12-07 DIAGNOSIS — C61 Malignant neoplasm of prostate: Secondary | ICD-10-CM | POA: Diagnosis present

## 2014-12-07 DIAGNOSIS — I959 Hypotension, unspecified: Secondary | ICD-10-CM | POA: Diagnosis present

## 2014-12-07 DIAGNOSIS — D509 Iron deficiency anemia, unspecified: Secondary | ICD-10-CM | POA: Diagnosis present

## 2014-12-07 DIAGNOSIS — R319 Hematuria, unspecified: Secondary | ICD-10-CM | POA: Diagnosis present

## 2014-12-07 DIAGNOSIS — D649 Anemia, unspecified: Secondary | ICD-10-CM | POA: Diagnosis not present

## 2014-12-07 DIAGNOSIS — I480 Paroxysmal atrial fibrillation: Secondary | ICD-10-CM | POA: Diagnosis present

## 2014-12-07 DIAGNOSIS — R531 Weakness: Secondary | ICD-10-CM | POA: Diagnosis not present

## 2014-12-07 DIAGNOSIS — N289 Disorder of kidney and ureter, unspecified: Secondary | ICD-10-CM | POA: Diagnosis not present

## 2014-12-07 DIAGNOSIS — N179 Acute kidney failure, unspecified: Secondary | ICD-10-CM

## 2014-12-07 DIAGNOSIS — D631 Anemia in chronic kidney disease: Secondary | ICD-10-CM

## 2014-12-07 DIAGNOSIS — Z950 Presence of cardiac pacemaker: Secondary | ICD-10-CM

## 2014-12-07 DIAGNOSIS — I495 Sick sinus syndrome: Secondary | ICD-10-CM | POA: Diagnosis not present

## 2014-12-07 DIAGNOSIS — E875 Hyperkalemia: Secondary | ICD-10-CM | POA: Diagnosis present

## 2014-12-07 DIAGNOSIS — Z993 Dependence on wheelchair: Secondary | ICD-10-CM

## 2014-12-07 DIAGNOSIS — Z8679 Personal history of other diseases of the circulatory system: Secondary | ICD-10-CM

## 2014-12-07 DIAGNOSIS — M25569 Pain in unspecified knee: Secondary | ICD-10-CM | POA: Diagnosis present

## 2014-12-07 DIAGNOSIS — I4891 Unspecified atrial fibrillation: Secondary | ICD-10-CM | POA: Diagnosis not present

## 2014-12-07 DIAGNOSIS — Z79899 Other long term (current) drug therapy: Secondary | ICD-10-CM

## 2014-12-07 DIAGNOSIS — Z791 Long term (current) use of non-steroidal anti-inflammatories (NSAID): Secondary | ICD-10-CM

## 2014-12-07 LAB — COMPREHENSIVE METABOLIC PANEL
ALT: 10 U/L — ABNORMAL LOW (ref 17–63)
AST: 14 U/L — ABNORMAL LOW (ref 15–41)
Albumin: 2.9 g/dL — ABNORMAL LOW (ref 3.5–5.0)
Alkaline Phosphatase: 54 U/L (ref 38–126)
Anion gap: 10 (ref 5–15)
BUN: 33 mg/dL — AB (ref 6–20)
CALCIUM: 8.9 mg/dL (ref 8.9–10.3)
CO2: 22 mmol/L (ref 22–32)
Chloride: 102 mmol/L (ref 101–111)
Creatinine, Ser: 2.88 mg/dL — ABNORMAL HIGH (ref 0.61–1.24)
GFR calc non Af Amer: 19 mL/min — ABNORMAL LOW (ref >60)
GFR, EST AFRICAN AMERICAN: 22 mL/min — AB (ref >60)
Glucose, Bld: 112 mg/dL — ABNORMAL HIGH (ref 65–99)
POTASSIUM: 5.1 mmol/L (ref 3.5–5.1)
SODIUM: 134 mmol/L — AB (ref 135–145)
TOTAL PROTEIN: 6.9 g/dL (ref 6.5–8.1)
Total Bilirubin: 0.4 mg/dL (ref 0.3–1.2)

## 2014-12-07 LAB — CBC
HEMATOCRIT: 25.2 % — AB (ref 39.0–52.0)
Hemoglobin: 8 g/dL — ABNORMAL LOW (ref 13.0–17.0)
MCH: 26.8 pg (ref 26.0–34.0)
MCHC: 31.7 g/dL (ref 30.0–36.0)
MCV: 84.3 fL (ref 78.0–100.0)
PLATELETS: 428 10*3/uL — AB (ref 150–400)
RBC: 2.99 MIL/uL — ABNORMAL LOW (ref 4.22–5.81)
RDW: 15.2 % (ref 11.5–15.5)
WBC: 11.1 10*3/uL — AB (ref 4.0–10.5)

## 2014-12-07 LAB — CBC WITH DIFFERENTIAL/PLATELET
BASO%: 0.4 % (ref 0.0–2.0)
BASOS ABS: 0 10*3/uL (ref 0.0–0.1)
EOS%: 1.3 % (ref 0.0–7.0)
Eosinophils Absolute: 0.1 10*3/uL (ref 0.0–0.5)
HEMATOCRIT: 23.6 % — AB (ref 38.4–49.9)
HEMOGLOBIN: 7.5 g/dL — AB (ref 13.0–17.1)
LYMPH#: 1 10*3/uL (ref 0.9–3.3)
LYMPH%: 12.7 % — ABNORMAL LOW (ref 14.0–49.0)
MCH: 26.9 pg — ABNORMAL LOW (ref 27.2–33.4)
MCHC: 31.9 g/dL — ABNORMAL LOW (ref 32.0–36.0)
MCV: 84.2 fL (ref 79.3–98.0)
MONO#: 0.5 10*3/uL (ref 0.1–0.9)
MONO%: 6.1 % (ref 0.0–14.0)
NEUT#: 6.4 10*3/uL (ref 1.5–6.5)
NEUT%: 79.5 % — ABNORMAL HIGH (ref 39.0–75.0)
Platelets: 474 10*3/uL — ABNORMAL HIGH (ref 140–400)
RBC: 2.8 10*6/uL — ABNORMAL LOW (ref 4.20–5.82)
RDW: 16.7 % — AB (ref 11.0–14.6)
WBC: 8.1 10*3/uL (ref 4.0–10.3)

## 2014-12-07 LAB — URINALYSIS, ROUTINE W REFLEX MICROSCOPIC
Bilirubin Urine: NEGATIVE
Glucose, UA: NEGATIVE mg/dL
Hgb urine dipstick: NEGATIVE
Ketones, ur: NEGATIVE mg/dL
Leukocytes, UA: NEGATIVE
Nitrite: POSITIVE — AB
Protein, ur: NEGATIVE mg/dL
Specific Gravity, Urine: 1.005 (ref 1.005–1.030)
pH: 6.5 (ref 5.0–8.0)

## 2014-12-07 LAB — URINE MICROSCOPIC-ADD ON
RBC / HPF: NONE SEEN RBC/hpf (ref 0–5)
Squamous Epithelial / LPF: NONE SEEN

## 2014-12-07 LAB — TROPONIN I

## 2014-12-07 LAB — PROTIME-INR
INR: 2.62 — ABNORMAL HIGH (ref 0.00–1.49)
Prothrombin Time: 27.6 seconds — ABNORMAL HIGH (ref 11.6–15.2)

## 2014-12-07 MED ORDER — MIRABEGRON ER 25 MG PO TB24
25.0000 mg | ORAL_TABLET | Freq: Every day | ORAL | Status: DC
  Administered 2014-12-08 – 2014-12-09 (×2): 25 mg via ORAL
  Filled 2014-12-07 (×2): qty 1

## 2014-12-07 MED ORDER — SODIUM CHLORIDE 0.9 % IV SOLN: INTRAVENOUS | Status: AC

## 2014-12-07 MED ORDER — PROPAFENONE HCL 225 MG PO TABS
225.0000 mg | ORAL_TABLET | Freq: Two times a day (BID) | ORAL | Status: DC
  Administered 2014-12-07 – 2014-12-09 (×4): 225 mg via ORAL
  Filled 2014-12-07 (×5): qty 1

## 2014-12-07 MED ORDER — DONEPEZIL HCL 5 MG PO TABS
5.0000 mg | ORAL_TABLET | Freq: Every day | ORAL | Status: DC
  Administered 2014-12-07 – 2014-12-08 (×2): 5 mg via ORAL
  Filled 2014-12-07 (×2): qty 1

## 2014-12-07 MED ORDER — ACETAMINOPHEN 650 MG RE SUPP: 650.0000 mg | Freq: Four times a day (QID) | RECTAL | Status: DC | PRN

## 2014-12-07 MED ORDER — ZOLPIDEM TARTRATE 5 MG PO TABS: 5.0000 mg | ORAL_TABLET | Freq: Every evening | ORAL | Status: DC | PRN

## 2014-12-07 MED ORDER — HYDROCODONE-ACETAMINOPHEN 5-325 MG PO TABS
1.0000 | ORAL_TABLET | ORAL | Status: DC | PRN
  Administered 2014-12-07: 2 via ORAL
  Administered 2014-12-08: 1 via ORAL
  Administered 2014-12-09: 2 via ORAL
  Filled 2014-12-07: qty 1
  Filled 2014-12-07 (×2): qty 2

## 2014-12-07 MED ORDER — LEVOTHYROXINE SODIUM 25 MCG PO TABS
137.0000 ug | ORAL_TABLET | Freq: Every day | ORAL | Status: DC
  Administered 2014-12-08 – 2014-12-09 (×2): 137 ug via ORAL
  Filled 2014-12-07 (×2): qty 1

## 2014-12-07 MED ORDER — ONDANSETRON HCL 4 MG/2ML IJ SOLN: 4.0000 mg | Freq: Four times a day (QID) | INTRAMUSCULAR | Status: DC | PRN

## 2014-12-07 MED ORDER — ONDANSETRON HCL 4 MG PO TABS: 4.0000 mg | ORAL_TABLET | Freq: Four times a day (QID) | ORAL | Status: DC | PRN

## 2014-12-07 MED ORDER — PRAMIPEXOLE DIHYDROCHLORIDE 0.125 MG PO TABS
0.1250 mg | ORAL_TABLET | Freq: Three times a day (TID) | ORAL | Status: DC
  Administered 2014-12-07 – 2014-12-09 (×6): 0.125 mg via ORAL
  Filled 2014-12-07 (×7): qty 1

## 2014-12-07 MED ORDER — SODIUM CHLORIDE 0.9 % IV SOLN
INTRAVENOUS | Status: AC
  Administered 2014-12-07: 23:00:00 via INTRAVENOUS

## 2014-12-07 MED ORDER — MEGESTROL ACETATE 20 MG PO TABS
20.0000 mg | ORAL_TABLET | Freq: Every day | ORAL | Status: DC
  Administered 2014-12-08 – 2014-12-09 (×2): 20 mg via ORAL
  Filled 2014-12-07 (×2): qty 1

## 2014-12-07 MED ORDER — ACETAMINOPHEN 325 MG PO TABS: 650.0000 mg | ORAL_TABLET | Freq: Four times a day (QID) | ORAL | Status: DC | PRN

## 2014-12-07 MED ORDER — SODIUM CHLORIDE 0.9 % IV SOLN
INTRAVENOUS | Status: DC
  Administered 2014-12-07 (×2): 125 mL/h via INTRAVENOUS

## 2014-12-07 MED ORDER — DARBEPOETIN ALFA 300 MCG/0.6ML IJ SOSY
300.0000 ug | PREFILLED_SYRINGE | Freq: Once | INTRAMUSCULAR | Status: AC
  Administered 2014-12-07: 300 ug via SUBCUTANEOUS
  Filled 2014-12-07: qty 0.6

## 2014-12-07 MED ORDER — SODIUM CHLORIDE 0.9 % IV BOLUS (SEPSIS)
1000.0000 mL | Freq: Once | INTRAVENOUS | Status: AC
  Administered 2014-12-07: 1000 mL via INTRAVENOUS

## 2014-12-07 MED ORDER — PROPRANOLOL HCL 40 MG PO TABS
60.0000 mg | ORAL_TABLET | Freq: Every day | ORAL | Status: DC
  Administered 2014-12-08 – 2014-12-09 (×2): 60 mg via ORAL
  Filled 2014-12-07 (×2): qty 1

## 2014-12-07 MED ORDER — SODIUM CHLORIDE 0.9 % IJ SOLN
3.0000 mL | Freq: Two times a day (BID) | INTRAMUSCULAR | Status: DC
  Administered 2014-12-07 – 2014-12-09 (×4): 3 mL via INTRAVENOUS

## 2014-12-07 NOTE — ED Notes (Signed)
Hx of prostate cancer, indwelling foley with blood in the line (wife states that's occasionally normal for patient). Came from cancer center with BP 70/30. Pt appears pale, weak, states he feels like he may pass out. BP 86/58 in triage. Denies pain

## 2014-12-07 NOTE — ED Notes (Signed)
Hospitalist at bedside 

## 2014-12-07 NOTE — ED Notes (Signed)
Provided warm blanket x 2.

## 2014-12-07 NOTE — ED Notes (Signed)
Nurse drawing labs. 

## 2014-12-07 NOTE — H&P (Signed)
PCP:  [name], MD  Neurology [facility] Caryl Comes  Referring provider Irena Cords   Chief Complaint:  fatigue HPI: [name] is a 81 y.o. male   has a past medical history of Orthostatic lightheadedness; History of subdural hematoma; Atrial fibrillation, persistent ([place]); H/O cardiac radiofrequency ablation; Cardiac pacemaker (last check 08-30-2013); RBBB; Benign essential tremor; Diverticulosis of colon; Tachy-brady syndrome (HCC); SSS (sick sinus syndrome) ([place]); Hydronephrosis, bilateral; Renal insufficiency; and Recurrent prostate carcinoma ([place]).   Presented with  Patient today was seen at hematology oncology for his scheduled Aranesp injection his hemoglobin was found to be 7.5 while in the office. He's has been very fatigued and tired while in office blood pressure was noted to be down to 73/46. Patient was sent to emergency department for evaluation. Patient has been feeling progressively more fatigued have been feeling lightheaded. Patient was rehydrated aggressively with IV fluids which has improved his blood pressure greatly resolved his symptoms. On father evaluation he was noted to have acute on chronic renal failure with creatinine rise to 2.88 from baseline 1.7 Patient denies any chest pain. He was diaphoretic while hypotensive. Reports good PO intake, no nausea no vomiting.  Urine was obtained showing positive nitrites. Repeat hemoglobin 8.0 Patient has history of metastatic prostate cancer status post radioactive seed implants, bladder neck resection with recurrent hematuria and significant anemia for which she has been treated with iron aspirin by hematology. Regarding his prostate cancer is being followed by urology and required resection of a bladder with urethral stents placement and indwelling Foley catheter. On her medical problems this patient's been having gait difficulties for which she has been followed up by neurology workup was  done but no clear etiology was determined at this point he is wheelchair bound. Patient has known history of paroxysmal atrial fibrillation and complete heart block status post pacemaker fallow up by cardiology. patient has history of obstructive coronary artery disease per cardiac catheterization 2008 but has been stable on medical therapy.  Hospitalist was called for admission for acute on chronic renal failure and dehydration   Review of Systems:    Pertinent positives include: fatigue,   Constitutional:  No weight loss, night sweats, Fevers, chills, weight loss  HEENT:  No headaches, Difficulty swallowing,Tooth/dental problems,Sore throat,  No sneezing, itching, ear ache, nasal congestion, post nasal drip,  Cardio-vascular:  No chest pain, Orthopnea, PND, anasarca, dizziness, palpitations.no Bilateral lower extremity swelling  GI:  No heartburn, indigestion, abdominal pain, nausea, vomiting, diarrhea, change in bowel habits, loss of appetite, melena, blood in stool, hematemesis Resp:  no shortness of breath at rest. No dyspnea on exertion, No excess mucus, no productive cough, No non-productive cough, No coughing up of blood.No change in color of mucus.No wheezing. Skin:  no rash or lesions. No jaundice GU:  no dysuria, change in color of urine, no urgency or frequency. No straining to urinate.  No flank pain.  Musculoskeletal:  No joint pain or no joint swelling. No decreased range of motion. No back pain.  Psych:  No change in mood or affect. No depression or anxiety. No memory loss.  Neuro: no localizing neurological complaints, no tingling, no weakness, no double vision, no gait abnormality, no slurred speech, no confusion  Otherwise ROS are negative except for above, 10 systems were reviewed  Past Medical History: Past Medical History  Diagnosis Date  . Orthostatic lightheadedness   . History of subdural hematoma     LEFT CHRONIC SUBDURAL HEMATOMA  S/P PARIETAL  CRANIOTOMY WITH EVACUATION HEMATOMA  . Atrial fibrillation, persistent ([place])   . H/O cardiac radiofrequency ablation     A-FLUTTER  . Cardiac pacemaker last check 08-30-2013    05-05-2007  DD  MEDTRONIC GUIDANT INSIGNIA  . RBBB   . Benign essential tremor   . Diverticulosis of colon   . Tachy-brady syndrome ([place])   . SSS (sick sinus syndrome) ([place])   . Hydronephrosis, bilateral   . Renal insufficiency   . Recurrent prostate carcinoma ([place])     2005  S/P RADIOACTIVE SEED IMPLANTS/   BLADDER NECK MASS RESECTED 05/ 2015  SHOWED RECURRENT PROSTATE CANCER   Past Surgical History  Procedure Laterality Date  . Tonsillectomy  as child  . Subdural hematoma evacuation via craniotomy  12-16-2007  . Rotator cuff repair w/ distal clavicle excision Left 08-31-2010  . Pacemaker insertion  05-05-2007      Guidant [employer])  . Total knee arthroplasty  12/18/2010    Procedure: TOTAL KNEE ARTHROPLASTY;  Surgeon: Mauri Pole;  Location: [facility];  Service: Orthopedics;  Laterality: Left;  . Cataract extraction    . Cystoscopy w/ ureteral stent placement Bilateral 05/30/2013    Procedure: CYSTOSCOPY WITH RETROGRADE PYELOGRAM/ BILATERAL URETERAL STENT PLACEMENT, TRANSURETRAL RESECTION OF BLADDER TUMOR WITH GYRUS;  Surgeon: Alexis Frock, MD;  [facility]: [facility];  Service: Urology;  Laterality: Bilateral;  . Cardioversion N/A 08/04/2013    Procedure: CARDIOVERSION;  Surgeon: Sanda Klein, MD;  [facility];  Service: Cardiovascular;  Laterality: N/A;  . Knee arthroscopy Left 02-11-2007  . Cardioversion  multiple--  last one 08-04-2013    SINCE 2008  . Cardiac electrophysiology mapping and ablation  04-06-2007  dr Caryl Comes  . Cardiac catheterization  12-22-2006      non-obstructive CAD / RCA 20%/  ef 50%/  a-fib  . Radioactive prostate seed implants  12-21-2003  . Orif left distal clavical fx  08-31-2010  . Colonoscopy w/ polypectomy  12-01-2008  . Transthoracic echocardiogram   05-31-2013    MILD LVH/  EF 65%/  MODERATE LAE/  TRIVIAL MR  &  TR  . Cystoscopy/retrograde/ureteroscopy Right 09/16/2013    Procedure: CYSTOSCOPY/RETROGRADE/URETEROSCOPIC STENT EXTRACTION/  bil retrograde pyelogram with inseertion of bilateral stents;  Surgeon: Jorja Loa, MD;  Location: [facility];  Service: Urology;  Laterality: Right;  . Cystoscopy w/ ureteral stent placement Bilateral 10/10/2014    Procedure: CYSTOSCOPY, WITH STENT REPLACEMENT, RIGHT URETEROSCOPY ;  Surgeon: Franchot Gallo, MD;  [facility];  Service: Urology;  Laterality: Bilateral;     Medications: Prior to Admission medications   Medication Sig Start Date End Date Taking? Authorizing Provider  donepezil (ARICEPT) 5 MG tablet TAKE 1 TABLET BY MOUTH AT BEDTIME. START WITH 1 TAB WITH FOOD DAILY FOR 1 MONTH THEN 2 TABS DAILY Patient taking differently: TAKE 1 TABLET BY MOUTH BID 10/25/14  Yes Garvin Fila, MD  glucosamine-chondroitin 500-400 MG tablet Take 1 tablet by mouth daily as needed (pain).    Yes Historical Provider, MD  levothyroxine (SYNTHROID, LEVOTHROID) 137 MCG tablet Take 137 mcg by mouth daily before breakfast.   Yes Historical Provider, MD  megestrol (MEGACE) 20 MG tablet Take 20 mg by mouth daily.    Yes Historical Provider, MD  mirabegron ER (MYRBETRIQ) 25 MG TB24 tablet Take 25 mg by mouth daily.   Yes Historical Provider, MD  Multiple Vitamin (MULTIVITAMIN WITH MINERALS) TABS tablet Take 1 tablet by mouth daily.   Yes  Historical Provider, MD  pramipexole (MIRAPEX) 0.125 MG tablet Take 1 tablet by mouth qhs 09/09/14  Yes Historical Provider, MD  propafenone (RYTHMOL) 225 MG tablet TAKE 1 TABLET (225 MG TOTAL) BY MOUTH 2 (TWO) TIMES DAILY. 06/06/14  Yes Deboraha Sprang, MD  propranolol (INDERAL) 60 MG tablet Take 60 mg by mouth daily.   Yes Historical Provider, MD  rivaroxaban (XARELTO) 15 MG TABS tablet Take 1 tablet (15 mg total) by mouth daily with supper. Patient taking  differently: Take 15 mg by mouth at bedtime.  05/09/14  Yes Deboraha Sprang, MD  PRESCRIPTION MEDICATION Supportive therapy    [place]    Historical Provider, MD  zolpidem (AMBIEN) 10 MG tablet Take 10 mg by mouth at bedtime as needed for sleep.  03/29/14   Historical Provider, MD    Allergies:  No Known Allergies  Social History:  Ambulatory  wheelchair bound  Lives at home  With family     reports that he has never smoked. He has never used smokeless tobacco. He reports that he drinks about 12.6 oz of alcohol per week. He reports that he does not use illicit drugs.    Family History: family history includes Aneurysm in his father; Stroke in his father.    Physical Exam: Patient Vitals for the past 24 hrs:  BP Temp Temp src Pulse Resp SpO2  12/07/14 1821 140/70 mmHg - - 60 26 98 %  12/07/14 1730 112/75 mmHg - - (!) 59 22 100 %  12/07/14 1720 (!) 136/32 mmHg - - 60 14 100 %  12/07/14 1630 138/64 mmHg - - 60 15 96 %  12/07/14 1513 109/61 mmHg - - 60 19 92 %  12/07/14 1431 (!) 86/58 mmHg 97.6 F (36.4 C) Oral 60 20 96 %    1. General:  in No Acute distress 2. Psychological: Alert and Oriented 3. Head/ENT:   Dry Mucous Membranes                          Head Non traumatic, neck supple                          Normal   Dentition 4. SKIN:  decreased Skin turgor,  Skin clean Dry and intact no rash 5. Heart: Regular rate and rhythm no Murmur, Rub or gallop 6. Lungs: Clear to auscultation bilaterally, no wheezes or crackles   7. Abdomen: Soft, non-tender, Non distended 8. Lower extremities: no clubbing, cyanosis, or edema 9. Neurologically Grossly intact, moving all 4 extremities equally 10. MSK: Normal range of motion  body mass index is unknown because there is no weight on file.   Labs on Admission:   Results for orders placed or performed during the hospital encounter of 12/07/14 (from the past 24 hour(s))  Type and screen [place]     Status: None    Collection Time: 12/07/14  2:39 PM  Result Value Ref Range   ABO/RH(D) O POS    Antibody Screen NEG    Sample Expiration 12/10/2014   Comprehensive metabolic panel     Status: Abnormal   Collection Time: 12/07/14  3:02 PM  Result Value Ref Range   Sodium 134 (L) 135 - 145 mmol/L   Potassium 5.1 3.5 - 5.1 mmol/L   Chloride 102 101 - 111 mmol/L   CO2 22 22 - 32 mmol/L   Glucose, Bld 112 (H)  65 - 99 mg/dL   BUN 33 (H) 6 - 20 mg/dL   Creatinine, Ser 2.88 (H) 0.61 - 1.24 mg/dL   Calcium 8.9 8.9 - 10.3 mg/dL   Total Protein 6.9 6.5 - 8.1 g/dL   Albumin 2.9 (L) 3.5 - 5.0 g/dL   AST 14 (L) 15 - 41 U/L   ALT 10 (L) 17 - 63 U/L   Alkaline Phosphatase 54 38 - 126 U/L   Total Bilirubin 0.4 0.3 - 1.2 mg/dL   GFR calc non Af Amer 19 (L) >60 mL/min   GFR calc Af Amer 22 (L) >60 mL/min   Anion gap 10 5 - 15  CBC     Status: Abnormal   Collection Time: 12/07/14  3:02 PM  Result Value Ref Range   WBC 11.1 (H) 4.0 - 10.5 K/uL   RBC 2.99 (L) 4.22 - 5.81 MIL/uL   Hemoglobin 8.0 (L) 13.0 - 17.0 g/dL   HCT 25.2 (L) 39.0 - 52.0 %   MCV 84.3 78.0 - 100.0 fL   MCH 26.8 26.0 - 34.0 pg   MCHC 31.7 30.0 - 36.0 g/dL   RDW 15.2 11.5 - 15.5 %   Platelets 428 (H) 150 - 400 K/uL  Protime-INR     Status: Abnormal   Collection Time: 12/07/14  3:02 PM  Result Value Ref Range   Prothrombin Time 27.6 (H) 11.6 - 15.2 seconds   INR 2.62 (H) 0.00 - 1.49    UA positive nitrates   No results found for: HGBA1C  CrCl cannot be calculated (Unknown ideal weight.).  BNP (last 3 results) No results for input(s): PROBNP in the last 8760 hours.  Other results:  I have pearsonaly reviewed this: ECG REPORTNot obtained   There were no vitals filed for this visit.   Cultures:    Component Value Date/Time   SDES URINE, CATHETERIZED 10/01/2014 2303   SPECREQUEST NONE 10/01/2014 2303   CULT  10/01/2014 2303    MULTIPLE SPECIES PRESENT, SUGGEST RECOLLECTION Performed at [facility]  10/04/2014 FINAL 10/01/2014 2303     Radiological Exams on Admission: No results found.  Chart has been reviewed  Family  at  Bedside  plan of care was discussed with  Daughter Humphrey Rolls [phone]      Wife Marshell Rotenberg not at bed side [phone]  Assessment/Plan  81 year old gentleman with history of prostate cancer with metastases, anemia of chronic disease on Aranesp treatment,  atrial fibrillation, coronary artery disease, complete AV block status post pacemaker who presented with acute on chronic renal failure dehydration  Present on Admission:  . SICK SINUS/ TACHY-BRADY SYNDROME status post pacemaker currently well-controlled  . Hyperkalemia mild will administer IV fluids and recheck in the morning  . ATRIAL FIBRILLATION-paroxysmal given significant drop in hemoglobin will hold off on anticoagulation for now currently appears to be rate controlled continue Rythmol  . Acute on chronic renal failure (HCC) Will administer IV fluids patient appeared to have been dehydrated improved with fluid resuscitation. Will obtain renal ultrasound to rule out hydronephrosis given continuous hematuria  . Anemia patient has ongoing hematuria which can possibly worsen chronic anemia. Will obtain Hemoccult stools and anemia panel hold off on anticoagulation. Given renal insufficiency patient's choice of anticoagulation long-term should be adjusted  . Prostate cancer - we will renal ultrasound to evaluate for any hydronephrosis or obstruction secondary to history of bladder mass   hypotension transient currently resolved was likely secondary to  dehydration improved to be fluid resuscitation. We'll cycle cardiac enzymes monitor on tele   Prophylaxis: SCD   CODE STATUS:   DNR/DNI as per patient    Disposition:   To home once workup is complete and patient is stable  Other plan as per orders.  I have spent a total of 55 min on this admission  Vollie Aaron 12/07/2014, 7:05  PM  [facility]  Pager [phone]   after 2 AM please page floor coverage PA If 7AM-7PM, please contact the day team taking care of the patient  [email or web address]  Password [email or web address]

## 2014-12-07 NOTE — Progress Notes (Signed)
[name] here for lab and Aranesp injection.  HGB 7.5 today.  He is extremely weak and tired--he is slumped over in W/C .  He isn't able to get out of chair without help.  BP 73/46-60.   Recheck of BP 75/44-60.  He became diaphoric.  Not having any pain at this time.  Dr Alen Blew saw patient and said that he needed to go to the ER to be evaluated.   Dixie, RN escorted him to the ER via wheelchair.

## 2014-12-07 NOTE — ED Notes (Signed)
Bed: WA15 Expected date:  Expected time:  Means of arrival:  Comments: RES B 

## 2014-12-07 NOTE — ED Provider Notes (Signed)
CSN: [id number]     Arrival date & time 12/07/14  1416 History   First MD Initiated Contact with Patient 12/07/14 1508     Chief Complaint  Patient presents with  . Near Syncope  . Low HgB   . Weakness     (Consider location/radiation/quality/duration/timing/severity/associated sxs/prior Treatment) HPI Patient presents to the emergency department with weakness and near syncope.  Patient states the symptoms got worse over the last few days.  Patient, states she has had weakness for quite a while, but got worse.  He states he was seen by his cancer doctor today and was sent to the emergency department for further evaluation.  Patient states that he has a history of anemia.  Patient denies chest pain, shortness of breath, nausea, vomiting, weakness, dizziness, headache, blurred vision, back pain, neck pain, fever, cough, rash, no sore throat, diarrhea, or syncope.  The patient states that nothing seems make his condition, better or worse Past Medical History  Diagnosis Date  . Orthostatic lightheadedness   . History of subdural hematoma     LEFT CHRONIC SUBDURAL HEMATOMA   S/P PARIETAL CRANIOTOMY WITH EVACUATION HEMATOMA  . Atrial fibrillation, persistent ([place])   . H/O cardiac radiofrequency ablation     A-FLUTTER  . Cardiac pacemaker last check 08-30-2013    05-05-2007  DD  MEDTRONIC GUIDANT INSIGNIA  . RBBB   . Benign essential tremor   . Diverticulosis of colon   . Tachy-brady syndrome ([place])   . SSS (sick sinus syndrome) ([place])   . Hydronephrosis, bilateral   . Renal insufficiency   . Recurrent prostate carcinoma ([place])     2005  S/P RADIOACTIVE SEED IMPLANTS/   BLADDER NECK MASS RESECTED 05/ 2015  SHOWED RECURRENT PROSTATE CANCER   Past Surgical History  Procedure Laterality Date  . Tonsillectomy  as child  . Subdural hematoma evacuation via craniotomy  12-16-2007  . Rotator cuff repair w/ distal clavicle excision Left 08-31-2010  . Pacemaker insertion  05-05-2007      Guidant  [employer])  . Total knee arthroplasty  12/18/2010    Procedure: TOTAL KNEE ARTHROPLASTY;  Surgeon: Mauri Pole;  Location: [facility];  Service: Orthopedics;  Laterality: Left;  . Cataract extraction    . Cystoscopy w/ ureteral stent placement Bilateral 05/30/2013    Procedure: CYSTOSCOPY WITH RETROGRADE PYELOGRAM/ BILATERAL URETERAL STENT PLACEMENT, TRANSURETRAL RESECTION OF BLADDER TUMOR WITH GYRUS;  Surgeon: Alexis Frock, MD;  [facility];  Service: Urology;  Laterality: Bilateral;  . Cardioversion N/A 08/04/2013    Procedure: CARDIOVERSION;  Surgeon: Sanda Klein, MD;  [facility];  Service: Cardiovascular;  Laterality: N/A;  . Knee arthroscopy Left 02-11-2007  . Cardioversion  multiple--  last one 08-04-2013    SINCE 2008  . Cardiac electrophysiology mapping and ablation  04-06-2007  dr Caryl Comes  . Cardiac catheterization  12-22-2006      non-obstructive CAD / RCA 20%/  ef 50%/  a-fib  . Radioactive prostate seed implants  12-21-2003  . Orif left distal clavical fx  08-31-2010  . Colonoscopy w/ polypectomy  12-01-2008  . Transthoracic echocardiogram  05-31-2013    MILD LVH/  EF 65%/  MODERATE LAE/  TRIVIAL MR  &  TR  . Cystoscopy/retrograde/ureteroscopy Right 09/16/2013    Procedure: CYSTOSCOPY/RETROGRADE/URETEROSCOPIC STENT EXTRACTION/  bil retrograde pyelogram with inseertion of bilateral stents;  Surgeon: Jorja Loa, MD;  Location: [facility];  Service: Urology;  Laterality: Right;  . Cystoscopy w/ ureteral stent  placement Bilateral 10/10/2014    Procedure: CYSTOSCOPY, WITH STENT REPLACEMENT, RIGHT URETEROSCOPY ;  Surgeon: Franchot Gallo, MD;  [facility];  Service: Urology;  Laterality: Bilateral;   Family History  Problem Relation Age of Onset  . Aneurysm Father   . Stroke Father    Social History  Substance Use Topics  . Smoking status: Never Smoker   . Smokeless tobacco: Never Used  . Alcohol Use: 12.6 oz/week    7  Glasses of wine, 7 Standard drinks or equivalent, 7 Shots of liquor per week     Comment: 2 glasses wine day    Review of Systems All other systems negative except as documented in the HPI. All pertinent positives and negatives as reviewed in the HPI.   Allergies  Review of patient's allergies indicates no known allergies.  Home Medications   Prior to Admission medications   Medication Sig Start Date End Date Taking? Authorizing Provider  donepezil (ARICEPT) 5 MG tablet TAKE 1 TABLET BY MOUTH AT BEDTIME. START WITH 1 TAB WITH FOOD DAILY FOR 1 MONTH THEN 2 TABS DAILY Patient taking differently: TAKE 1 TABLET BY MOUTH BID 10/25/14  Yes Garvin Fila, MD  glucosamine-chondroitin 500-400 MG tablet Take 1 tablet by mouth daily as needed (pain).    Yes Historical Provider, MD  levothyroxine (SYNTHROID, LEVOTHROID) 137 MCG tablet Take 137 mcg by mouth daily before breakfast.   Yes Historical Provider, MD  megestrol (MEGACE) 20 MG tablet Take 20 mg by mouth daily.    Yes Historical Provider, MD  mirabegron ER (MYRBETRIQ) 25 MG TB24 tablet Take 25 mg by mouth daily.   Yes Historical Provider, MD  Multiple Vitamin (MULTIVITAMIN WITH MINERALS) TABS tablet Take 1 tablet by mouth daily.   Yes Historical Provider, MD  pramipexole (MIRAPEX) 0.125 MG tablet Take 1 tablet by mouth qhs 09/09/14  Yes Historical Provider, MD  propafenone (RYTHMOL) 225 MG tablet TAKE 1 TABLET (225 MG TOTAL) BY MOUTH 2 (TWO) TIMES DAILY. 06/06/14  Yes Deboraha Sprang, MD  propranolol (INDERAL) 60 MG tablet Take 60 mg by mouth daily.   Yes Historical Provider, MD  rivaroxaban (XARELTO) 15 MG TABS tablet Take 1 tablet (15 mg total) by mouth daily with supper. Patient taking differently: Take 15 mg by mouth at bedtime.  05/09/14  Yes Deboraha Sprang, MD  PRESCRIPTION MEDICATION Supportive therapy    [place]    Historical Provider, MD  zolpidem (AMBIEN) 10 MG tablet Take 10 mg by mouth at bedtime as needed for sleep.  03/29/14    Historical Provider, MD   BP 112/75 mmHg  Pulse 59  Temp(Src) 97.6 F (36.4 C) (Oral)  Resp 22  SpO2 100% Physical Exam  Constitutional: He is oriented to person, place, and time. He appears well-developed and well-nourished. No distress.  HENT:  Head: Normocephalic and atraumatic.  Mouth/Throat: Oropharynx is clear and moist.  Eyes: Pupils are equal, round, and reactive to light.  Neck: Normal range of motion. Neck supple.  Cardiovascular: Normal rate, regular rhythm and normal heart sounds.  Exam reveals no gallop and no friction rub.   No murmur heard. Pulmonary/Chest: Effort normal and breath sounds normal. No respiratory distress. He has no wheezes.  Abdominal: Soft. Bowel sounds are normal. He exhibits no distension. There is no tenderness.  Neurological: He is alert and oriented to person, place, and time. He exhibits normal muscle tone. Coordination normal.  Skin: Skin is warm and dry. No rash noted. No erythema.  Psychiatric: He has a normal mood and affect. His behavior is normal.  Nursing note and vitals reviewed.   ED Course  Procedures (including critical care time) Labs Review Labs Reviewed  COMPREHENSIVE METABOLIC PANEL - Abnormal; Notable for the following:    Sodium 134 (*)    Glucose, Bld 112 (*)    BUN 33 (*)    Creatinine, Ser 2.88 (*)    Albumin 2.9 (*)    AST 14 (*)    ALT 10 (*)    GFR calc non Af Amer 19 (*)    GFR calc Af Amer 22 (*)    All other components within normal limits  CBC - Abnormal; Notable for the following:    WBC 11.1 (*)    RBC 2.99 (*)    Hemoglobin 8.0 (*)    HCT 25.2 (*)    Platelets 428 (*)    All other components within normal limits  PROTIME-INR - Abnormal; Notable for the following:    Prothrombin Time 27.6 (*)    INR 2.62 (*)    All other components within normal limits  URINALYSIS, ROUTINE W REFLEX MICROSCOPIC (NOT AT [facility])  TYPE AND SCREEN    Imaging Review No results found. I have personally reviewed and  evaluated these images and lab results as part of my medical decision-making.   EKG Interpretation None      The patient be admitted to the hospital for further evaluation and care and is increased creatinine, and lower hemoglobin as compared to earlier in the month.  The patient is advised with plan and all questions were answered.  Patient was given IV fluids.  I spoke with the [facility] who will admit the patient for further evaluation    Dalia Heading, PA-C [id number]  Delora Fuel, MD [phone]

## 2014-12-08 ENCOUNTER — Inpatient Hospital Stay (HOSPITAL_COMMUNITY): Payer: PPO

## 2014-12-08 ENCOUNTER — Other Ambulatory Visit: Payer: Self-pay

## 2014-12-08 DIAGNOSIS — D509 Iron deficiency anemia, unspecified: Secondary | ICD-10-CM

## 2014-12-08 DIAGNOSIS — I480 Paroxysmal atrial fibrillation: Secondary | ICD-10-CM

## 2014-12-08 DIAGNOSIS — I4891 Unspecified atrial fibrillation: Secondary | ICD-10-CM

## 2014-12-08 LAB — CBC
HEMATOCRIT: 20.1 % — AB (ref 39.0–52.0)
Hemoglobin: 6.5 g/dL — CL (ref 13.0–17.0)
MCH: 27.7 pg (ref 26.0–34.0)
MCHC: 32.3 g/dL (ref 30.0–36.0)
MCV: 85.5 fL (ref 78.0–100.0)
PLATELETS: 322 10*3/uL (ref 150–400)
RBC: 2.35 MIL/uL — AB (ref 4.22–5.81)
RDW: 15.4 % (ref 11.5–15.5)
WBC: 7.1 10*3/uL (ref 4.0–10.5)

## 2014-12-08 LAB — COMPREHENSIVE METABOLIC PANEL
ALT: 10 U/L — AB (ref 17–63)
AST: 10 U/L — AB (ref 15–41)
Albumin: 2.4 g/dL — ABNORMAL LOW (ref 3.5–5.0)
Alkaline Phosphatase: 43 U/L (ref 38–126)
Anion gap: 7 (ref 5–15)
BUN: 32 mg/dL — ABNORMAL HIGH (ref 6–20)
CHLORIDE: 111 mmol/L (ref 101–111)
CO2: 19 mmol/L — AB (ref 22–32)
CREATININE: 2.64 mg/dL — AB (ref 0.61–1.24)
Calcium: 8.2 mg/dL — ABNORMAL LOW (ref 8.9–10.3)
GFR calc non Af Amer: 21 mL/min — ABNORMAL LOW (ref >60)
GFR, EST AFRICAN AMERICAN: 25 mL/min — AB (ref >60)
Glucose, Bld: 88 mg/dL (ref 65–99)
Potassium: 4.4 mmol/L (ref 3.5–5.1)
SODIUM: 137 mmol/L (ref 135–145)
Total Bilirubin: 0.5 mg/dL (ref 0.3–1.2)
Total Protein: 5.6 g/dL — ABNORMAL LOW (ref 6.5–8.1)

## 2014-12-08 LAB — IRON AND TIBC
IRON: 10 ug/dL — AB (ref 45–182)
Saturation Ratios: 4 % — ABNORMAL LOW (ref 17.9–39.5)
TIBC: 223 ug/dL — AB (ref 250–450)
UIBC: 213 ug/dL

## 2014-12-08 LAB — PREPARE RBC (CROSSMATCH)

## 2014-12-08 LAB — FERRITIN: Ferritin: 38 ng/mL (ref 24–336)

## 2014-12-08 LAB — MAGNESIUM: Magnesium: 1.7 mg/dL (ref 1.7–2.4)

## 2014-12-08 LAB — TSH: TSH: 1.41 u[IU]/mL (ref 0.350–4.500)

## 2014-12-08 LAB — RETICULOCYTES
RBC.: 2.35 MIL/uL — ABNORMAL LOW (ref 4.22–5.81)
RETIC COUNT ABSOLUTE: 18.8 10*3/uL — AB (ref 19.0–186.0)
Retic Ct Pct: 0.8 % (ref 0.4–3.1)

## 2014-12-08 LAB — TROPONIN I

## 2014-12-08 LAB — VITAMIN B12: VITAMIN B 12: 545 pg/mL (ref 180–914)

## 2014-12-08 LAB — PHOSPHORUS: Phosphorus: 4.2 mg/dL (ref 2.5–4.6)

## 2014-12-08 LAB — FOLATE: Folate: 31.2 ng/mL (ref >5.9)

## 2014-12-08 MED ORDER — POLYETHYLENE GLYCOL 3350 17 G PO PACK: 17.0000 g | PACK | Freq: Every day | ORAL | Status: DC | PRN

## 2014-12-08 MED ORDER — SODIUM CHLORIDE 0.9 % IV SOLN: Freq: Once | INTRAVENOUS | Status: DC

## 2014-12-08 MED ORDER — SODIUM CHLORIDE 0.9 % IV SOLN
INTRAVENOUS | Status: DC
Start: 2014-12-08 — End: 2014-12-09
  Administered 2014-12-08 – 2014-12-09 (×2): via INTRAVENOUS

## 2014-12-08 MED ORDER — FERROUS SULFATE 325 (65 FE) MG PO TABS
325.0000 mg | ORAL_TABLET | Freq: Every day | ORAL | Status: DC
  Administered 2014-12-08 – 2014-12-09 (×2): 325 mg via ORAL
  Filled 2014-12-08 (×2): qty 1

## 2014-12-08 MED ORDER — DOCUSATE SODIUM 100 MG PO CAPS
100.0000 mg | ORAL_CAPSULE | Freq: Every day | ORAL | Status: DC
  Administered 2014-12-08 – 2014-12-09 (×2): 100 mg via ORAL
  Filled 2014-12-08 (×2): qty 1

## 2014-12-08 NOTE — Progress Notes (Signed)
CRITICAL VALUE ALERT  Critical value received:  hgb 6.5  Date of notification:  12/08/14  Time of notification:  0227  Critical value read back:Yes.    Nurse who received alert:  Virgina Norfolk  MD notified (1st page):  Schorr  Time of first page:  0242  MD notified (2nd page):  Time of second page:  Responding MD:  Schorr  Time MD responded:  [id number]

## 2014-12-08 NOTE — Progress Notes (Signed)
[facility] PROGRESS NOTE  [name] [id number] DOB: Aug 20, 1931 DOA: 12/07/2014 PCP: [name], MD  HPI/Brief narrative 81yo with hx of subdural hematoma; Atrial fibrillation, persistent ([place]); H/O cardiac radiofrequency ablation; Cardiac pacemaker (last check 08-30-2013); RBBB; Benign essential tremor; Diverticulosis of colon; Tachy-brady syndrome (HCC); SSS (sick sinus syndrome) ([place]); Hydronephrosis, bilateral; Renal insufficiency; and Recurrent prostate carcinoma ([place]) who presents from Hematology clinic with increased fatigue, tiredness, and found to be hypotensive. Patient was found to have ARF and was subsequently admitted  Assessment/Plan: . SICK SINUS/ TACHY-BRADY SYNDROME status post pacemaker currently well-controlled  . Hyperkalemia resolved  . ATRIAL FIBRILLATION-paroxysmal given significant drop in hemoglobin will cont to hold off on anticoagulation for now currently appears to be rate controlled continue Rythmol  . Acute on chronic renal failure ([place]) Renal US with medical renal disease. Pt is continued on IVF hydration with mild improvement in renal function overnight  . Iron deficiency anemia Followed by Hematology as outpatient. Per outpt records, cause of anemia appears to be multifactorial. Pt is noted to be iron deficient. Will start PO iron  -Pt was given one unit of PRBC's over night with hgb up to 8.5 from 6.5 . Prostate cancer -stable hypotension bp currently stable. Cont to monitor  Code Status: DNR Family Communication: Pt in room, family at bedside Disposition Plan: Home in 24-48hrs if hgb stable and renal function normalizes  Consultants:  Hematology  Procedures:    Antibiotics: Anti-infectives    None      HPI/Subjective: Feels better today. Denies abd pain  Objective: Filed Vitals:   12/08/14 0422 12/08/14 0648 12/08/14 1052 12/08/14 1300  BP: 147/58 161/69 127/68 126/59  Pulse: 60 64 63 60  Temp: 98.3 F (36.8 C)  98.3 F (36.8 C)  98.2 F (36.8 C)  TempSrc: Oral Oral  Oral  Resp: 19 19  18   Height:      Weight:      SpO2: 100% 100%  100%    Intake/Output Summary (Last 24 hours) at 12/08/14 1537 Last data filed at 12/08/14 1416  Gross per 24 hour  Intake 1558.67 ml  Output   1500 ml  Net  58.67 ml   Filed Weights   12/07/14 2120  Weight: 76.4 kg (168 lb 6.9 oz)    Exam:   General:  Awake, in nad  Cardiovascular: regular, s1, s2  Respiratory: normal resp effort, no wheezing  Abdomen: soft,nondistended  Musculoskeletal: perfused, no clubbing   Data Reviewed: Basic Metabolic Panel:  Recent Labs Lab 12/07/14 1502 12/08/14 0200  NA 134* 137  K 5.1 4.4  CL 102 111  CO2 22 19*  GLUCOSE 112* 88  BUN 33* 32*  CREATININE 2.88* 2.64*  CALCIUM 8.9 8.2*  MG  --  1.7  PHOS  --  4.2   Liver Function Tests:  Recent Labs Lab 12/07/14 1502 12/08/14 0200  AST 14* 10*  ALT 10* 10*  ALKPHOS 54 43  BILITOT 0.4 0.5  PROT 6.9 5.6*  ALBUMIN 2.9* 2.4*   No results for input(s): LIPASE, AMYLASE in the last 168 hours. No results for input(s): AMMONIA in the last 168 hours. CBC:  Recent Labs Lab 12/07/14 1320 12/07/14 1502 12/08/14 0200 12/08/14 0959  WBC 8.1 11.1* 7.1  --   NEUTROABS 6.4  --   --   --   HGB 7.5* 8.0* 6.5* 8.5*  HCT 23.6* 25.2* 20.1* 26.5*  MCV 84.2 84.3 85.5  --   PLT 474* 428* 322  --  Cardiac Enzymes:  Recent Labs Lab 12/07/14 1956 12/08/14 0200 12/08/14 0951  TROPONINI <0.03 <0.03 <0.03   BNP (last 3 results) No results for input(s): BNP in the last 8760 hours.  ProBNP (last 3 results) No results for input(s): PROBNP in the last 8760 hours.  CBG: No results for input(s): GLUCAP in the last 168 hours.  No results found for this or any previous visit (from the past 240 hour(s)).   Studies: US Renal  12/07/2014  CLINICAL DATA:  Acute on chronic renal failure. EXAM: RENAL / URINARY TRACT ULTRASOUND COMPLETE COMPARISON:  CT abdomen  and pelvis 03/17/2014 FINDINGS: Right Kidney: Length: 11.9 cm. Mild hydronephrosis. Mild diffuse renal parenchymal thinning. No mass lesion. Left Kidney: Length: 12.8 cm. Mild hydronephrosis. Mild diffuse renal parenchymal thinning. No mass lesion. Bladder: Enlarged prostate gland. Bladder wall is not thickened. Echogenic filling defect may represent Foley catheter or ureteral stents. Previous images from 09/07/2014 indicated bilateral ureteral stents. Ureteral stents are not visualized in the kidneys but there is limited visualization due to rib and bowel shadowing. IMPRESSION: Bilateral mild renal hydronephrosis. Mild diffuse renal parenchymal thinning. Electronically Signed   By: Lucienne Capers M.D.   On: 12/07/2014 23:55    Scheduled Meds: . sodium chloride   Intravenous Once  . docusate sodium  100 mg Oral Daily  . donepezil  5 mg Oral QHS  . ferrous sulfate  325 mg Oral Q breakfast  . levothyroxine  137 mcg Oral QAC breakfast  . megestrol  20 mg Oral Daily  . mirabegron ER  25 mg Oral Daily  . pramipexole  0.125 mg Oral TID  . propafenone  225 mg Oral BID  . propranolol  60 mg Oral Daily  . sodium chloride  3 mL Intravenous Q12H   Continuous Infusions: . sodium chloride 50 mL/hr at 12/08/14 1317    Active Problems:   ATRIAL FIBRILLATION-paroxysmal   SICK SINUS/ TACHY-BRADY SYNDROME   Anemia   ARF (acute renal failure) (HCC)   Hyperkalemia   Prostate cancer   Weakness   Acute on chronic renal failure ([place])  Alexi Dorminey, [facility] Pager [phone]. If 7PM-7AM, please contact night-coverage at [email or web address], password [facility] 12/08/2014, 3:37 PM  LOS: 1 day

## 2014-12-08 NOTE — Progress Notes (Signed)
  Echocardiogram 2D Echocardiogram has been performed.  Johny Chess 12/08/2014, 10:46 AM

## 2014-12-09 DIAGNOSIS — N289 Disorder of kidney and ureter, unspecified: Secondary | ICD-10-CM

## 2014-12-09 DIAGNOSIS — D649 Anemia, unspecified: Secondary | ICD-10-CM

## 2014-12-09 DIAGNOSIS — M25561 Pain in right knee: Secondary | ICD-10-CM

## 2014-12-09 LAB — BASIC METABOLIC PANEL
Anion gap: 7 (ref 5–15)
BUN: 27 mg/dL — AB (ref 6–20)
CHLORIDE: 112 mmol/L — AB (ref 101–111)
CO2: 18 mmol/L — AB (ref 22–32)
CREATININE: 2.58 mg/dL — AB (ref 0.61–1.24)
Calcium: 8.4 mg/dL — ABNORMAL LOW (ref 8.9–10.3)
GFR calc non Af Amer: 22 mL/min — ABNORMAL LOW (ref >60)
GFR, EST AFRICAN AMERICAN: 25 mL/min — AB (ref >60)
Glucose, Bld: 105 mg/dL — ABNORMAL HIGH (ref 65–99)
POTASSIUM: 4.3 mmol/L (ref 3.5–5.1)
Sodium: 137 mmol/L (ref 135–145)

## 2014-12-09 LAB — HEMOGLOBIN A1C
Hgb A1c MFr Bld: 5.7 % — ABNORMAL HIGH (ref 4.8–5.6)
Mean Plasma Glucose: 117 mg/dL

## 2014-12-09 LAB — CBC
HEMATOCRIT: 23.3 % — AB (ref 39.0–52.0)
Hemoglobin: 7.5 g/dL — ABNORMAL LOW (ref 13.0–17.0)
MCH: 27.4 pg (ref 26.0–34.0)
MCHC: 32.2 g/dL (ref 30.0–36.0)
MCV: 85 fL (ref 78.0–100.0)
PLATELETS: 346 10*3/uL (ref 150–400)
RBC: 2.74 MIL/uL — AB (ref 4.22–5.81)
RDW: 15.5 % (ref 11.5–15.5)
WBC: 6.9 10*3/uL (ref 4.0–10.5)

## 2014-12-09 LAB — PREPARE RBC (CROSSMATCH)

## 2014-12-09 LAB — HEMOGLOBIN AND HEMATOCRIT, BLOOD
HCT: 26.5 % — ABNORMAL LOW (ref 39.0–52.0)
HEMATOCRIT: 27.1 % — AB (ref 39.0–52.0)
HEMOGLOBIN: 8.7 g/dL — AB (ref 13.0–17.0)
Hemoglobin: 8.5 g/dL — ABNORMAL LOW (ref 13.0–17.0)

## 2014-12-09 MED ORDER — POLYETHYLENE GLYCOL 3350 17 G PO PACK: 17.0000 g | PACK | Freq: Every day | ORAL | Status: DC | PRN

## 2014-12-09 MED ORDER — FERROUS SULFATE 325 (65 FE) MG PO TABS: 325.0000 mg | ORAL_TABLET | Freq: Every day | ORAL | Status: DC

## 2014-12-09 MED ORDER — DOCUSATE SODIUM 100 MG PO CAPS: 100.0000 mg | ORAL_CAPSULE | Freq: Every day | ORAL | Status: DC

## 2014-12-09 MED ORDER — SODIUM CHLORIDE 0.9 % IV SOLN
Freq: Once | INTRAVENOUS | Status: AC
  Administered 2014-12-09: 11:00:00 via INTRAVENOUS

## 2014-12-09 NOTE — Progress Notes (Signed)
Instructions reviewed with patients spouse over the phone per her request. Questions concerns were denied. Pt is a&o at baseline. Pt to be discharged with foley catheter

## 2014-12-09 NOTE — Progress Notes (Signed)
Pt and wife at bedside selected [place] for [facility]. Referral given to in house rep.

## 2014-12-09 NOTE — Progress Notes (Signed)
OT Cancellation Note  Patient Details Name: [name] MRN: [id number] DOB: December 12, 1931   Cancelled Treatment:    Reason Eval/Treat Not Completed: Other (comment).  Noted, plan is for d/c later today.  I do not have time to evaluate him for OT, but I stopped by the room to talk to him.  He feels weaker than baseline and is interested in [facility].  Spoke to nursing about getting an order to add this service. Thank you.  Kersten Salmons 12/09/2014, 4:07 PM  Lesle Chris, OTR/L [phone] 12/09/2014

## 2014-12-09 NOTE — Evaluation (Signed)
Physical Therapy Evaluation Patient Details Name: [name] MRN: [id number] DOB: 1931/08/19 Today's Date: 12/09/2014   History of Present Illness  81 yo male with hx of subdural hematoma; Atrial fibrillation; H/O cardiac radio frequency ablation; Cardiac pacemaker; RBBB; Benign essential tremor; Diverticulosis of colon; Tachy-brady syndrome; SSS (sick sinus syndrome); Hydronephrosis, bilateral; Renal insufficiency; and Recurrent prostate carcinoma who presents from Hematology clinic with increased fatigue, tiredness, and found to be hypotensive, in acute renal failure.  Clinical Impression  Pt admitted with above diagnosis. Pt currently with functional limitations due to the deficits listed below (see PT Problem List).  Pt will benefit from skilled PT to increase their independence and safety with mobility to allow discharge to the venue listed below.   Pt reports requiring assist for mobility at home which family provides.  Pt reports his family can continue to assist him upon d/c, and he is very agreeable to HHPT.     Follow Up Recommendations Home health PT;Supervision/Assistance - 24 hour    Equipment Recommendations  None recommended by PT    Recommendations for Other Services       Precautions / Restrictions Precautions Precautions: Fall      Mobility  Bed Mobility Overal bed mobility: Needs Assistance Bed Mobility: Supine to Sit;Sit to Supine     Supine to sit: Max assist Sit to supine: Mod assist   General bed mobility comments: assist for LEs over EOB and sitting upright, required assist for LEs onto bed  Transfers                 General transfer comment: did not perform; pt felt too weak and fatigued  Ambulation/Gait                Stairs            Wheelchair Mobility    Modified Rankin (Stroke Patients Only)       Balance Overall balance assessment: Needs assistance Sitting-balance support: Bilateral upper extremity  supported;Feet supported Sitting balance-Leahy Scale: Poor Sitting balance - Comments: requires bil UE support, posterior lean without support and also with fatigue, sat EOB 5 min Postural control: Posterior lean                                   Pertinent Vitals/Pain Pain Assessment: No/denies pain    Home Living Family/patient expects to be discharged to:: Private residence Living Arrangements: Spouse/significant other Available Help at Discharge: Family Type of Home:  ("flat") Home Access: Ramped entrance     Home Layout: One level Home Equipment: Environmental consultant - 2 wheels;Wheelchair - [employer];Toilet riser;Hospital bed      Prior Function Level of Independence: Needs assistance   Gait / Transfers Assistance Needed: able to stand but reports only short distance "shuffling" with w/c following  ADL's / Homemaking Assistance Needed: requires assist for transfers        Hand Dominance        Extremity/Trunk Assessment               Lower Extremity Assessment: Generalized weakness (grossly 3/5 throughout, poor motor control)         Communication   Communication: No difficulties  Cognition Arousal/Alertness: Awake/alert Behavior During Therapy: WFL for tasks assessed/performed Overall Cognitive Status: Within Functional Limits for tasks assessed  General Comments      Exercises        Assessment/Plan    PT Assessment Patient needs continued PT services  PT Diagnosis Generalized weakness   PT Problem List Decreased strength;Decreased activity tolerance;Decreased balance;Decreased mobility  PT Treatment Interventions DME instruction;Gait training;Therapeutic activities;Functional mobility training;Patient/family education;Therapeutic exercise;Balance training;Neuromuscular re-education   PT Goals (Current goals can be found in the Care Plan section) Acute Rehab PT Goals PT Goal Formulation: With  patient Time For Goal Achievement: 12/16/14 Potential to Achieve Goals: Good    Frequency Min 3X/week   Barriers to discharge        Co-evaluation               End of Session   Activity Tolerance: Patient limited by fatigue Patient left: in bed;with call bell/phone within reach;with bed alarm set Nurse Communication: Mobility status         Time: [id number] PT Time Calculation (min) (ACUTE ONLY): 23 min   Charges:   PT Evaluation $Initial PT Evaluation Tier I: 1 Procedure     PT G Codes:        Joud Ingwersen,KATHrine E 12/09/2014, 12:58 PM Carmelia Bake, PT, DPT 12/09/2014 Pager: [phone]

## 2014-12-09 NOTE — Discharge Summary (Signed)
Physician Discharge Summary  [name] [id number] DOB: 11-14-1931 DOA: 12/07/2014  PCP: [name], MD  Admit date: 12/07/2014 Discharge date: 12/09/2014  Time spent: 20 minutes  Recommendations for Outpatient Follow-up:  1. Follow up with PCP in 2-3 weeks 2. Follow up with Dr. Alen Blew as scheduled   Discharge Diagnoses:  Principal Problem:   Acute on chronic renal failure ([place]) Active Problems:   ATRIAL FIBRILLATION-paroxysmal   SICK SINUS/ TACHY-BRADY SYNDROME   Anemia   ARF (acute renal failure) (HCC)   Hyperkalemia   Prostate cancer   Weakness   Anemia, iron deficiency   Discharge Condition: Improved  Diet recommendation: Regular  Filed Weights   12/07/14 2120  Weight: 76.4 kg (168 lb 6.9 oz)    History of present illness:  Please review dictated H and P from 11/23 for details. Briefly, 81yo with hx of subdural hematoma; Atrial fibrillation, persistent ([place]); H/O cardiac radiofrequency ablation; Cardiac pacemaker (last check 08-30-2013); RBBB; Benign essential tremor; Diverticulosis of colon; Tachy-brady syndrome (HCC); SSS (sick sinus syndrome) ([place]); Hydronephrosis, bilateral; Renal insufficiency; and Recurrent prostate carcinoma ([place]) who presents from Hematology clinic with increased fatigue, tiredness, and found to be hypotensive. Patient was found to have ARF and was subsequently admitted  Hospital Course:   . SICK SINUS/ TACHY-BRADY SYNDROME status post pacemaker currently well-controlled  . Hyperkalemia resolved  . ATRIAL FIBRILLATION-paroxysmal. Given notable drop in hemoglobin on admit, temporarily held off on anticoagulation while admitted. Remained rate controlled on Rythmol  . Acute on chronic renal failure ([place]) Renal US with medical renal disease. Pt was continued on IVF hydration with gradyual improvement in renal function . Iron deficiency anemia Followed by Hematology as outpatient. Per outpt records, cause of anemia appears to be  multifactorial. Pt is noted to be iron deficient. Will start PO iron  -Pt was given one unit of PRBC's on 11/24 with hgb up to 8.5 from 6.5 -On 11/25, hgb noted to be 7.5. One more unit of PRBC's ordered per HemeOnc recs -Patient to follow up with Dr. Alen Blew as scheduled . Prostate cancer -stable hypotension bp stable. Cont to monitor  Consultations:  Hematology  Discharge Exam: Filed Vitals:   12/09/14 0514 12/09/14 1008 12/09/14 1240 12/09/14 1324  BP: 166/76 128/70 138/65 155/74  Pulse: 60 60 84 64  Temp: 98.4 F (36.9 C)  97.7 F (36.5 C) 97.5 F (36.4 C)  TempSrc: Oral  Oral Axillary  Resp: 18  16   Height:      Weight:      SpO2: 97%  98% 98%    General: Awake, in nad Cardiovascular: regular, s1, s2 Respiratory: normal resp effort, no wheezing  Discharge Instructions     Medication List    TAKE these medications        docusate sodium 100 MG capsule  Commonly known as:  COLACE  Take 1 capsule (100 mg total) by mouth daily.     donepezil 5 MG tablet  Commonly known as:  ARICEPT  TAKE 1 TABLET BY MOUTH AT BEDTIME. START WITH 1 TAB WITH FOOD DAILY FOR 1 MONTH THEN 2 TABS DAILY     ferrous sulfate 325 (65 FE) MG tablet  Take 1 tablet (325 mg total) by mouth daily with breakfast.     glucosamine-chondroitin 500-400 MG tablet  Take 1 tablet by mouth daily as needed (pain).     levothyroxine 137 MCG tablet  Commonly known as:  SYNTHROID, LEVOTHROID  Take 137 mcg by mouth daily before  breakfast.     megestrol 20 MG tablet  Commonly known as:  MEGACE  Take 20 mg by mouth daily.     multivitamin with minerals Tabs tablet  Take 1 tablet by mouth daily.     MYRBETRIQ 25 MG Tb24 tablet  Generic drug:  mirabegron ER  Take 25 mg by mouth daily.     polyethylene glycol packet  Commonly known as:  MIRALAX / GLYCOLAX  Take 17 g by mouth daily as needed for moderate constipation.     pramipexole 0.125 MG tablet  Commonly known as:  MIRAPEX  Take 1 tablet  by mouth qhs     PRESCRIPTION MEDICATION  Supportive therapy    [facility]     propafenone 225 MG tablet  Commonly known as:  RYTHMOL  TAKE 1 TABLET (225 MG TOTAL) BY MOUTH 2 (TWO) TIMES DAILY.     propranolol 60 MG tablet  Commonly known as:  INDERAL  Take 60 mg by mouth daily.     Rivaroxaban 15 MG Tabs tablet  Commonly known as:  XARELTO  Take 1 tablet (15 mg total) by mouth daily with supper.     zolpidem 10 MG tablet  Commonly known as:  AMBIEN  Take 10 mg by mouth at bedtime as needed for sleep.       No Known Allergies Follow-up Information    Follow up with [name], MD. Schedule an appointment as soon as possible for a visit in 2 weeks.   Specialty:  Internal Medicine   Why:  Hospital follow up   Contact information:   [address] [place] [address] [phone]       Follow up with [facility], [place].   Specialty:  Oncology   Why:  Follow up as directed by Dr. Hazeline Junker office   Contact information:   [place] [place] [address] [phone]        The results of significant diagnostics from this hospitalization (including imaging, microbiology, ancillary and laboratory) are listed below for reference.    Significant Diagnostic Studies: US Renal  12/07/2014  CLINICAL DATA:  Acute on chronic renal failure. EXAM: RENAL / URINARY TRACT ULTRASOUND COMPLETE COMPARISON:  CT abdomen and pelvis 03/17/2014 FINDINGS: Right Kidney: Length: 11.9 cm. Mild hydronephrosis. Mild diffuse renal parenchymal thinning. No mass lesion. Left Kidney: Length: 12.8 cm. Mild hydronephrosis. Mild diffuse renal parenchymal thinning. No mass lesion. Bladder: Enlarged prostate gland. Bladder wall is not thickened. Echogenic filling defect may represent Foley catheter or ureteral stents. Previous images from 09/07/2014 indicated bilateral ureteral stents. Ureteral stents are not visualized in the kidneys but there is limited visualization due to rib and  bowel shadowing. IMPRESSION: Bilateral mild renal hydronephrosis. Mild diffuse renal parenchymal thinning. Electronically Signed   By: Lucienne Capers M.D.   On: 12/07/2014 23:55    Microbiology: No results found for this or any previous visit (from the past 240 hour(s)).   Labs: Basic Metabolic Panel:  Recent Labs Lab 12/07/14 1502 12/08/14 0200 12/09/14 0415  NA 134* 137 137  K 5.1 4.4 4.3  CL 102 111 112*  CO2 22 19* 18*  GLUCOSE 112* 88 105*  BUN 33* 32* 27*  CREATININE 2.88* 2.64* 2.58*  CALCIUM 8.9 8.2* 8.4*  MG  --  1.7  --   PHOS  --  4.2  --    Liver Function Tests:  Recent Labs Lab 12/07/14 1502 12/08/14 0200  AST 14* 10*  ALT 10*  10*  ALKPHOS 54 43  BILITOT 0.4 0.5  PROT 6.9 5.6*  ALBUMIN 2.9* 2.4*   No results for input(s): LIPASE, AMYLASE in the last 168 hours. No results for input(s): AMMONIA in the last 168 hours. CBC:  Recent Labs Lab 12/07/14 1320 12/07/14 1502 12/08/14 0200 12/08/14 0959 12/09/14 0415  WBC 8.1 11.1* 7.1  --  6.9  NEUTROABS 6.4  --   --   --   --   HGB 7.5* 8.0* 6.5* 8.5* 7.5*  HCT 23.6* 25.2* 20.1* 26.5* 23.3*  MCV 84.2 84.3 85.5  --  85.0  PLT 474* 428* 322  --  346   Cardiac Enzymes:  Recent Labs Lab 12/07/14 1956 12/08/14 0200 12/08/14 0951  TROPONINI <0.03 <0.03 <0.03   BNP: BNP (last 3 results) No results for input(s): BNP in the last 8760 hours.  ProBNP (last 3 results) No results for input(s): PROBNP in the last 8760 hours.  CBG: No results for input(s): GLUCAP in the last 168 hours.   Signed:  CHIU, STEPHEN K  [facility] 12/09/2014, 1:40 PM

## 2014-12-09 NOTE — Progress Notes (Signed)
IP PROGRESS NOTE  Subjective:   Patient known to me with history of prostate cancer and multifactorial anemia. Patient admitted with worsening anemia and hypotension. He received packed red cell transfusions and appeared to be feeling much better. He is complaining of knee pain but otherwise no other complaints.  Objective:  Vital signs in last 24 hours: Temp:  [97.9 F (36.6 C)-98.4 F (36.9 C)] 98.4 F (36.9 C) (11/25 0514) Pulse Rate:  [59-63] 60 (11/25 0514) Resp:  [18-20] 18 (11/25 0514) BP: (126-166)/(59-76) 166/76 mmHg (11/25 0514) SpO2:  [97 %-100 %] 97 % (11/25 0514) Weight change:  Last BM Date: 12/06/14  Intake/Output from previous day: 11/24 0701 - 11/25 0700 In: 2422.5 [P.O.:720; I.V.:1702.5] Out: [id number] [Urine:1675] Alert, awake gentleman chronically ill-appearing. Mouth: mucous membranes moist, pharynx normal without lesions Resp: clear to auscultation bilaterally Cardio: regular rate and rhythm, S1, S2 normal, no murmur, click, rub or gallop GI: soft, non-tender; bowel sounds normal; no masses,  no organomegaly Extremities: extremities normal, atraumatic, no cyanosis or edema    Lab Results:  Recent Labs  12/08/14 0200 12/08/14 0959 12/09/14 0415  WBC 7.1  --  6.9  HGB 6.5* 8.5* 7.5*  HCT 20.1* 26.5* 23.3*  PLT 322  --  346    BMET  Recent Labs  12/08/14 0200 12/09/14 0415  NA 137 137  K 4.4 4.3  CL 111 112*  CO2 19* 18*  GLUCOSE 88 105*  BUN 32* 27*  CREATININE 2.64* 2.58*  CALCIUM 8.2* 8.4*    Studies/Results: US Renal  12/07/2014  CLINICAL DATA:  Acute on chronic renal failure. EXAM: RENAL / URINARY TRACT ULTRASOUND COMPLETE COMPARISON:  CT abdomen and pelvis 03/17/2014 FINDINGS: Right Kidney: Length: 11.9 cm. Mild hydronephrosis. Mild diffuse renal parenchymal thinning. No mass lesion. Left Kidney: Length: 12.8 cm. Mild hydronephrosis. Mild diffuse renal parenchymal thinning. No mass lesion. Bladder: Enlarged prostate gland. Bladder  wall is not thickened. Echogenic filling defect may represent Foley catheter or ureteral stents. Previous images from 09/07/2014 indicated bilateral ureteral stents. Ureteral stents are not visualized in the kidneys but there is limited visualization due to rib and bowel shadowing. IMPRESSION: Bilateral mild renal hydronephrosis. Mild diffuse renal parenchymal thinning. Electronically Signed   By: Lucienne Capers M.D.   On: 12/07/2014 23:55    Medications: I have reviewed the patient's current medications.  Assessment/Plan:  81 year old gentleman with the following issues:  1. Multifactorial anemia with anemia of renal disease as well as iron deficiency. I agree with iron supplements and once iron is fully repleted, we'll continue growth factor support. I am in favor of transfusing him again before discharge. His hemoglobin above 8.  2. Prostate cancer: Slowly progressing and we will address that as an outpatient as he might need to consult was therapy. In  3. Hematuria: Appears to have improved at this time his urine appears clear.  I agree with the current management and I appreciated of the hospitalist team care. I have no objections for discharge at once he is deemed medically stable. He has an outpatient follow-up at the [facility] upon his discharge.  Please call with any questions.   LOS: 2 days   [facility] 12/09/2014, 7:31 AM

## 2014-12-10 LAB — TYPE AND SCREEN
ABO/RH(D): O POS
ANTIBODY SCREEN: NEGATIVE
Unit division: 0
Unit division: 0

## 2014-12-12 LAB — CUP PACEART INCLINIC DEVICE CHECK
Battery Remaining Longevity: 6 mo
Brady Statistic AP VP Percent: 62 %
Brady Statistic AS VS Percent: 11 %
Implantable Lead Implant Date: 20090421
Implantable Lead Location: 753859
Implantable Lead Model: 5076
Lead Channel Impedance Value: 400 Ohm
Lead Channel Pacing Threshold Pulse Width: 0.4 ms
Lead Channel Pacing Threshold Pulse Width: 0.4 ms
Lead Channel Setting Pacing Amplitude: 2 V
Lead Channel Setting Pacing Amplitude: 2.4 V
MDC IDC LEAD IMPLANT DT: 20090421
MDC IDC LEAD LOCATION: 753860
MDC IDC MSMT LEADCHNL RA PACING THRESHOLD AMPLITUDE: 0.7 V
MDC IDC MSMT LEADCHNL RA SENSING INTR AMPL: 2.5 mV
MDC IDC MSMT LEADCHNL RV IMPEDANCE VALUE: 440 Ohm
MDC IDC MSMT LEADCHNL RV PACING THRESHOLD AMPLITUDE: 0.7 V
MDC IDC MSMT LEADCHNL RV SENSING INTR AMPL: 7.5 mV
MDC IDC PG SERIAL: 319544
MDC IDC SESS DTM: 20161128133706
MDC IDC SET LEADCHNL RV PACING PULSEWIDTH: 0.4 ms
MDC IDC SET LEADCHNL RV SENSING SENSITIVITY: 2.5 mV
MDC IDC STAT BRADY AP VS PERCENT: 26 %
MDC IDC STAT BRADY AS VP PERCENT: 0 %
Pulse Gen Model: 1297

## 2014-12-16 DIAGNOSIS — R001 Bradycardia, unspecified: Secondary | ICD-10-CM | POA: Diagnosis not present

## 2014-12-20 ENCOUNTER — Ambulatory Visit (INDEPENDENT_AMBULATORY_CARE_PROVIDER_SITE_OTHER): Payer: PPO | Admitting: Neurology

## 2014-12-20 ENCOUNTER — Encounter: Payer: Self-pay | Admitting: Neurology

## 2014-12-20 VITALS — BP 107/65 | HR 60 | Ht 72.0 in

## 2014-12-20 DIAGNOSIS — F0390 Unspecified dementia without behavioral disturbance: Secondary | ICD-10-CM

## 2014-12-20 MED ORDER — DONEPEZIL HCL 23 MG PO TABS: 23.0000 mg | ORAL_TABLET | Freq: Every day | ORAL | Status: DC

## 2014-12-20 NOTE — Patient Instructions (Signed)
I had a long discussion with the patient and his wife regarding his multifactorial gait difficulties and mild dementia which seems to have responded somewhat to Aricept 10 mg daily which is tolerating well. I recommend changing to Aricept 23 mg daily if tolerated. I discussed possible side effects with the patient and wife asked him to call me if needed. He was encouraged to continue working with home physical and occupational therapy for gait and balance training. He was also advised follow and safety precautions. Return for follow-up in 2 months or call earlier if necessary. Fall Prevention in the Home  Falls can cause injuries. They can happen to people of all ages. There are many things you can do to make your home safe and to help prevent falls.  WHAT CAN I DO ON THE OUTSIDE OF MY HOME?  Regularly fix the edges of walkways and driveways and fix any cracks.  Remove anything that might make you trip as you walk through a door, such as a raised step or threshold.  Trim any bushes or trees on the path to your home.  Use bright outdoor lighting.  Clear any walking paths of anything that might make someone trip, such as rocks or tools.  Regularly check to see if handrails are loose or broken. Make sure that both sides of any steps have handrails.  Any raised decks and porches should have guardrails on the edges.  Have any leaves, snow, or ice cleared regularly.  Use sand or salt on walking paths during winter.  Clean up any spills in your garage right away. This includes oil or grease spills. WHAT CAN I DO IN THE BATHROOM?   Use night lights.  Install grab bars by the toilet and in the tub and shower. Do not use towel bars as grab bars.  Use non-skid mats or decals in the tub or shower.  If you need to sit down in the shower, use a plastic, non-slip stool.  Keep the floor dry. Clean up any water that spills on the floor as soon as it happens.  Remove soap buildup in the tub or  shower regularly.  Attach bath mats securely with double-sided non-slip rug tape.  Do not have throw rugs and other things on the floor that can make you trip. WHAT CAN I DO IN THE BEDROOM?  Use night lights.  Make sure that you have a light by your bed that is easy to reach.  Do not use any sheets or blankets that are too big for your bed. They should not hang down onto the floor.  Have a firm chair that has side arms. You can use this for support while you get dressed.  Do not have throw rugs and other things on the floor that can make you trip. WHAT CAN I DO IN THE KITCHEN?  Clean up any spills right away.  Avoid walking on wet floors.  Keep items that you use a lot in easy-to-reach places.  If you need to reach something above you, use a strong step stool that has a grab bar.  Keep electrical cords out of the way.  Do not use floor polish or wax that makes floors slippery. If you must use wax, use non-skid floor wax.  Do not have throw rugs and other things on the floor that can make you trip. WHAT CAN I DO WITH MY STAIRS?  Do not leave any items on the stairs.  Make sure that there are handrails  on both sides of the stairs and use them. Fix handrails that are broken or loose. Make sure that handrails are as long as the stairways.  Check any carpeting to make sure that it is firmly attached to the stairs. Fix any carpet that is loose or worn.  Avoid having throw rugs at the top or bottom of the stairs. If you do have throw rugs, attach them to the floor with carpet tape.  Make sure that you have a light switch at the top of the stairs and the bottom of the stairs. If you do not have them, ask someone to add them for you. WHAT ELSE CAN I DO TO HELP PREVENT FALLS?  Wear shoes that:  Do not have high heels.  Have rubber bottoms.  Are comfortable and fit you well.  Are closed at the toe. Do not wear sandals.  If you use a stepladder:  Make sure that it is fully  opened. Do not climb a closed stepladder.  Make sure that both sides of the stepladder are locked into place.  Ask someone to hold it for you, if possible.  Clearly mark and make sure that you can see:  Any grab bars or handrails.  First and last steps.  Where the edge of each step is.  Use tools that help you move around (mobility aids) if they are needed. These include:  Canes.  Walkers.  Scooters.  Crutches.  Turn on the lights when you go into a dark area. Replace any light bulbs as soon as they burn out.  Set up your furniture so you have a clear path. Avoid moving your furniture around.  If any of your floors are uneven, fix them.  If there are any pets around you, be aware of where they are.  Review your medicines with your doctor. Some medicines can make you feel dizzy. This can increase your chance of falling. Ask your doctor what other things that you can do to help prevent falls.   This information is not intended to replace advice given to you by your health care provider. Make sure you discuss any questions you have with your health care provider.   Document Released: 10/27/2008 Document Revised: 05/17/2014 Document Reviewed: 02/04/2014 Elsevier Interactive Patient Education [employer].

## 2014-12-20 NOTE — Progress Notes (Signed)
[facility] [address] [place]. [place] [id number] [phone]       OFFICE FOLLOW UP VISIT NOTE  Mr. NAFTULA DONAHUE Date of Birth:  03-May-1931 Medical Record Number:  [id number]   Referring MD: Lajean Manes  Reason for Referral:   Falls and leg weakness  HPI: Mr Noyce is a 86 year Caucasian male who is accompanied today by his wife who provides most of the history as well as a letter written by his daughter regarding his neurological difficulties. Patient has had gradually progressive gait difficulties starting from 2013 when he was noted as shuffling and having multiple falls and he was using a walker. In summer of 2015 he could walk with a walker but during the winter he became completely dependent on it. In spring of 2016 there is rapid decline and he had 3 falls in the month of March as well as in June. Subsequently lost his ability to ambulate and stand even with a walker and on June 30 he had sudden onset of leg weakness on the right along with inability to even stand with support and required help to transfer from the bed to the chair as well as to the toilet. He had extreme fear of an anxiety of falling and he has not been walking since then. He spends most of the time in the wheelchair. The patient denies any headache and he neck pain, back pain, radicular pain in his extremities. He seems fairly strong when sitting in a chair but this cannot get up and hold his balance. He had previous history of subdural hematoma in 2009 requiring left parietal craniotomy by Dr. Cyndy Freeze. He also has atrial fibrillation and is on Xarelto. He denies any prior history of strokes. He was seen by me in 2013 for benign essential tremor as well as mild cognitive impairment and was started on Inderal which helped his tremors as well as Cerefolin for his cognitive impairment both of which appear to be stable. He also has sleep apnea and supposed to wear his CPAP mask but admits that he has  not been using it regularly. He has prostate cancer which has been fairly stable as per Dr. Diona Fanti his urologist. He also sees Dr. Alen Blew hematologist for his chronic multifactorial mild anemia. Update 09/26/2014 : He returns for follow-up after his last visit with me 2 months ago. Is accompanied by his wife states that patient continues to have problems with standing and getting up and walking. He is currently getting a home physical and occupational therapy and is able to walk with a walker with one person assist. He has good days and bad days. He underwent at my request CT scan of the spines on 07/28/14 which showed severe degenerative disc disease and facet disease at C5-6 with bilateral foraminal stenosis right greater than left. CT scan of the thoracic spine showed a small focal right paracentral and medial foraminal disc protrusion at T9-10. CT scan of the lumbar spine also shows diffuse disc bulging and spondylytic changes with broad-based foraminal disc protrusion into the right at L2-3. He subsequently.saw Dr. Cyndy Freeze who ordered a CT myelogram done on 09/07/14 which showed no significant spinal stenosis or root impingement. Moderate spinal canal and bilateral subarticular recess stenosis at L4-5. Patient was told he does not have a surgically treatable spinal stenosis. Patient had CT scan of the head which showed mild generalized cerebral atrophy which had progressed since previous CT scan from 2012. Patient admits to mild memory  and cognitive difficulties and on Mini-Mental status exam today score 19/30 which is a decline from 24/30 at last visit. He does admit to having some numbness in his feet but he has not been evaluated for neuropathy. The patient's wife appears quite frustrated without a definite diagnosis as to why patient can't walk. I spent a lot of time explained to her the results of the tests, my differential diagnosis and his balance problem is likely multifactorial due to combination of  several things. Update 12/20/2014 :  He returns for follow-up after last visit 3 months ago. Is accompanied by his wife. Patient states she's noticed improvement in his memory and cognition after starting the Aricept which is tolerating well and the dose of 10 mg a day. He was admitted a few days prior to Thanksgiving to the hospital on 12/07/14 with low blood pressure, diminished hematocrit and dehydration mild acute on chronic renal failure. I have reviewed the hospital records. He is doing better now. Is getting therapy at home and is able to walk with the help of the therapists but does not walk a lot. ROS:   14 system review of systems is positive for  gait difficulty, frequent falls, leg weakness, hand tremors, memory loss,   urination problems, incontinence, blood in urine,    chills, restless legs, joint pain, back pain, aching muscles, anemia and all other systems negative  PMH:  Past Medical History  Diagnosis Date  . Orthostatic lightheadedness   . History of subdural hematoma     LEFT CHRONIC SUBDURAL HEMATOMA   S/P PARIETAL CRANIOTOMY WITH EVACUATION HEMATOMA  . Atrial fibrillation, persistent ([place])   . H/O cardiac radiofrequency ablation     A-FLUTTER  . Cardiac pacemaker last check 08-30-2013    05-05-2007  DD  MEDTRONIC GUIDANT INSIGNIA  . RBBB   . Benign essential tremor   . Diverticulosis of colon   . Tachy-brady syndrome ([place])   . SSS (sick sinus syndrome) ([place])   . Hydronephrosis, bilateral   . Renal insufficiency   . Recurrent prostate carcinoma ([place])     2005  S/P RADIOACTIVE SEED IMPLANTS/   BLADDER NECK MASS RESECTED 05/ 2015  SHOWED RECURRENT PROSTATE CANCER    Social History:  Social History   Social History  . Marital Status: Married    Spouse Name: margaret  . Number of Children: 2  . Years of Education: BA   Occupational History  . retired    Social History Main Topics  . Smoking status: Never Smoker   . Smokeless tobacco: Never Used  . Alcohol  Use: 12.6 oz/week    7 Glasses of wine, 7 Standard drinks or equivalent, 7 Shots of liquor per week     Comment: 2 glasses wine day  . Drug Use: No  . Sexual Activity: Not on file   Other Topics Concern  . Not on file   Social History Narrative   Patient lives at home with his spouse.   Caffeine Use: 2 cups of coffee    Medications:   Current Outpatient Prescriptions on File Prior to Visit  Medication Sig Dispense Refill  . ferrous sulfate 325 (65 FE) MG tablet Take 1 tablet (325 mg total) by mouth daily with breakfast. 30 tablet 0  . glucosamine-chondroitin 500-400 MG tablet Take 1 tablet by mouth daily as needed (pain).     Marland Kitchen levothyroxine (SYNTHROID, LEVOTHROID) 137 MCG tablet Take 137 mcg by mouth daily before breakfast.    . megestrol (  MEGACE) 20 MG tablet Take 20 mg by mouth daily.     . mirabegron ER (MYRBETRIQ) 25 MG TB24 tablet Take 25 mg by mouth daily.    . Multiple Vitamin (MULTIVITAMIN WITH MINERALS) TABS tablet Take 1 tablet by mouth daily.    . pramipexole (MIRAPEX) 0.125 MG tablet Take 1 tablet by mouth qhs  0  . PRESCRIPTION MEDICATION Supportive therapy    [facility]    . propafenone (RYTHMOL) 225 MG tablet TAKE 1 TABLET (225 MG TOTAL) BY MOUTH 2 (TWO) TIMES DAILY. 60 tablet 6  . propranolol (INDERAL) 60 MG tablet Take 60 mg by mouth daily.    . rivaroxaban (XARELTO) 15 MG TABS tablet Take 1 tablet (15 mg total) by mouth daily with supper. (Patient taking differently: Take 15 mg by mouth at bedtime. ) 10 tablet 0  . zolpidem (AMBIEN) 10 MG tablet Take 10 mg by mouth at bedtime as needed for sleep.   1   No current facility-administered medications on file prior to visit.    Allergies:  No Known Allergies  Physical Exam General: Frail elderly Caucasian male, seated, in no evident distress. Left parietal craniotomy defect from prior subdural hematoma evacuation surgery Head: head normocephalic and atraumatic.   Neck: supple with no carotid or supraclavicular  bruits Cardiovascular: regular rate and rhythm, no murmurs Musculoskeletal: no deformity Skin:  no rash/petichiae. Has pacemaker left infraclavicular. Vascular:  Normal pulses all extremities  Neurologic Exam Mental Status: Awake and fully alert. Oriented to place and time. Recent and remote memory intact. Attention span, concentration and fund of knowledge mildly diminished.. Mood and affect appropriate. Mini-Mental status exam scored  22/30 ( improved from19/30 last visit) with deficits in orientation, attention, calculation. Animal naming test 7. Clock drawing 4/4. Glabellar tap and palmomental reflexes are absent Cranial Nerves: Fundoscopic exam not done  . Pupils equal, briskly reactive to light. Extraocular movements full without nystagmus. Visual fields full to confrontation. Hearing intact. Facial sensation intact. Face, tongue, palate moves normally and symmetrically.  Motor: Mild weakness of right lower extremity with hip flexors, knee extensors and ankle dorsiflexors 4/5. Tone is normal bilaterally. No resting tremor or cogwheel rigidity. Mild fine action tremor of outstretched upper extremities. Sensory.: Diminished touch , pinprick , position and vibratory sensation. In right lower extremity from hip down  Coordination: Rapid alternating movements normal in all extremities. Finger-to-nose and heel-to-shin performed accurately bilaterally. Gait and Station: Unable to stand up even with one person assist and tends to fall backwards and hence gait was not tested. Reflexes: 2+ and asymmetric brisker on the right. Toes downgoing.       ASSESSMENT: 64 year Caucasian male with a subacute decline in gait and balance with sudden worsening 1 week ago with inability to stand of unclear etiology. Etiology likely multifactorial due to combination of damage from prior subdural hemorrhage, cerebrovascular disease, mild dementia and underlying peripheral neuropathy and even possibly paraneoplastic  related to prostate cancer. Long-standing history of benign essential tremor  which appears stable    PLAN:  I had a long discussion with the patient and his wife regarding his multifactorial gait difficulties and mild dementia which seems to have responded somewhat to Aricept 10 mg daily which is tolerating well. I recommend changing to Aricept 23 mg daily if tolerated. I discussed possible side effects with the patient and wife asked him to call me if needed. He was encouraged to continue working with home physical and occupational therapy for gait and balance training.  He was also advised follow and safety precautions. Return for follow-up in 2 months or call earlier if necessary.Antony Contras, MD Note: This document was prepared with digital dictation and possible smart phrase technology. Any transcriptional errors that result from this process are unintentional.

## 2014-12-22 ENCOUNTER — Telehealth: Payer: Self-pay

## 2014-12-22 ENCOUNTER — Encounter: Payer: Self-pay | Admitting: *Deleted

## 2014-12-22 NOTE — Telephone Encounter (Signed)
[name] from Dr Geryl Councilman office called stating pt has declined rapidly and Dr Dorina Hoyer will be facilitating transfer to hospice care. Pt has a bone scan scheduled at [facility] tomorrow and Dr Alen Blew is ordering physician. Dr Dorina Hoyer feels this is should be cancelled. Jennifer's # [id number] ext [phone]

## 2014-12-22 NOTE — Progress Notes (Signed)
12/23/14 Lab and bone scan appts cancelled. Patient referred to hospice, per [facility]. Dr Alen Blew aware.

## 2014-12-23 ENCOUNTER — Encounter (HOSPITAL_COMMUNITY): Payer: PPO

## 2014-12-23 ENCOUNTER — Other Ambulatory Visit: Payer: PPO

## 2014-12-23 ENCOUNTER — Ambulatory Visit (HOSPITAL_COMMUNITY): Payer: PPO

## 2014-12-28 ENCOUNTER — Ambulatory Visit: Payer: PPO

## 2014-12-28 ENCOUNTER — Ambulatory Visit: Payer: PPO | Admitting: Oncology

## 2014-12-29 ENCOUNTER — Telehealth: Payer: Self-pay | Admitting: Internal Medicine

## 2014-12-29 NOTE — Telephone Encounter (Signed)
samples up front for pt

## 2014-12-29 NOTE — Telephone Encounter (Signed)
New Message  Patient calling the office for samples of medication:   1.  What medication and dosage are you requesting samples for? xarelto  2.  Are you currently out of this medication? yes

## 2015-01-30 ENCOUNTER — Encounter: Payer: Self-pay | Admitting: Internal Medicine

## 2015-02-02 ENCOUNTER — Encounter: Payer: Self-pay | Admitting: Internal Medicine

## 2015-02-02 NOTE — Progress Notes (Signed)
Walk in note received that Dr. Felipa Eth has taken the patient off Xarelto- reason unknown. He was returning samples.

## 2015-02-05 ENCOUNTER — Other Ambulatory Visit: Payer: Self-pay | Admitting: Internal Medicine

## 2015-02-06 ENCOUNTER — Encounter (HOSPITAL_COMMUNITY): Payer: Self-pay | Admitting: Emergency Medicine

## 2015-02-06 ENCOUNTER — Emergency Department (HOSPITAL_COMMUNITY)
Admission: EM | Admit: 2015-02-06 | Discharge: 2015-02-06 | Disposition: A | Discharge status: Home / Self Care | Payer: PPO | Attending: Emergency Medicine | Admitting: Emergency Medicine

## 2015-02-06 DIAGNOSIS — Z8669 Personal history of other diseases of the nervous system and sense organs: Secondary | ICD-10-CM | POA: Diagnosis not present

## 2015-02-06 DIAGNOSIS — R231 Pallor: Secondary | ICD-10-CM | POA: Insufficient documentation

## 2015-02-06 DIAGNOSIS — C61 Malignant neoplasm of prostate: Secondary | ICD-10-CM | POA: Diagnosis not present

## 2015-02-06 DIAGNOSIS — Z79899 Other long term (current) drug therapy: Secondary | ICD-10-CM | POA: Insufficient documentation

## 2015-02-06 DIAGNOSIS — T83098A Other mechanical complication of other indwelling urethral catheter, initial encounter: Secondary | ICD-10-CM | POA: Insufficient documentation

## 2015-02-06 DIAGNOSIS — Z950 Presence of cardiac pacemaker: Secondary | ICD-10-CM | POA: Insufficient documentation

## 2015-02-06 DIAGNOSIS — T83091A Other mechanical complication of indwelling urethral catheter, initial encounter: Secondary | ICD-10-CM

## 2015-02-06 DIAGNOSIS — I481 Persistent atrial fibrillation: Secondary | ICD-10-CM | POA: Diagnosis not present

## 2015-02-06 DIAGNOSIS — Z9861 Coronary angioplasty status: Secondary | ICD-10-CM | POA: Diagnosis not present

## 2015-02-06 DIAGNOSIS — Z9889 Other specified postprocedural states: Secondary | ICD-10-CM | POA: Diagnosis not present

## 2015-02-06 DIAGNOSIS — Z87448 Personal history of other diseases of urinary system: Secondary | ICD-10-CM | POA: Diagnosis not present

## 2015-02-06 DIAGNOSIS — Y658 Other specified misadventures during surgical and medical care: Secondary | ICD-10-CM | POA: Insufficient documentation

## 2015-02-06 DIAGNOSIS — F0390 Unspecified dementia without behavioral disturbance: Secondary | ICD-10-CM | POA: Diagnosis not present

## 2015-02-06 DIAGNOSIS — R3989 Other symptoms and signs involving the genitourinary system: Secondary | ICD-10-CM | POA: Diagnosis present

## 2015-02-06 DIAGNOSIS — Z8719 Personal history of other diseases of the digestive system: Secondary | ICD-10-CM | POA: Diagnosis not present

## 2015-02-06 LAB — URINALYSIS, ROUTINE W REFLEX MICROSCOPIC
BILIRUBIN URINE: NEGATIVE
GLUCOSE, UA: NEGATIVE mg/dL
Ketones, ur: NEGATIVE mg/dL
NITRITE: NEGATIVE
PH: 5 (ref 5.0–8.0)
Protein, ur: 100 mg/dL — AB
SPECIFIC GRAVITY, URINE: 1.019 (ref 1.005–1.030)

## 2015-02-06 LAB — CBC WITH DIFFERENTIAL/PLATELET
BASOS PCT: 0 %
Basophils Absolute: 0 10*3/uL (ref 0.0–0.1)
EOS PCT: 1 %
Eosinophils Absolute: 0.1 10*3/uL (ref 0.0–0.7)
HEMATOCRIT: 32.2 % — AB (ref 39.0–52.0)
Hemoglobin: 10 g/dL — ABNORMAL LOW (ref 13.0–17.0)
Lymphocytes Relative: 12 %
Lymphs Abs: 1.5 10*3/uL (ref 0.7–4.0)
MCH: 26.3 pg (ref 26.0–34.0)
MCHC: 31.1 g/dL (ref 30.0–36.0)
MCV: 84.7 fL (ref 78.0–100.0)
MONO ABS: 0.6 10*3/uL (ref 0.1–1.0)
MONOS PCT: 5 %
NEUTROS ABS: 10.1 10*3/uL — AB (ref 1.7–7.7)
Neutrophils Relative %: 82 %
Platelets: 382 10*3/uL (ref 150–400)
RBC: 3.8 MIL/uL — ABNORMAL LOW (ref 4.22–5.81)
RDW: 18.7 % — AB (ref 11.5–15.5)
WBC: 12.4 10*3/uL — ABNORMAL HIGH (ref 4.0–10.5)

## 2015-02-06 LAB — URINE MICROSCOPIC-ADD ON

## 2015-02-06 LAB — I-STAT CHEM 8, ED
BUN: 35 mg/dL — AB (ref 6–20)
CREATININE: 1.7 mg/dL — AB (ref 0.61–1.24)
Calcium, Ion: 1.22 mmol/L (ref 1.13–1.30)
Chloride: 107 mmol/L (ref 101–111)
GLUCOSE: 103 mg/dL — AB (ref 65–99)
HEMATOCRIT: 34 % — AB (ref 39.0–52.0)
Hemoglobin: 11.6 g/dL — ABNORMAL LOW (ref 13.0–17.0)
Potassium: 3.9 mmol/L (ref 3.5–5.1)
Sodium: 138 mmol/L (ref 135–145)
TCO2: 19 mmol/L (ref 0–100)

## 2015-02-06 NOTE — ED Notes (Signed)
Nurse collecting labs. 

## 2015-02-06 NOTE — ED Notes (Signed)
Per EMS, Pt from home c/o bladder pain. Pt has hx of prostate cancer. Pt has urinary catheter in, denies urinary retention. No blood noted in urine. Producing urine as normal. Pt c/o pain in groin area as well. Is not seeking any further treatment for prostate cancer. Pt takes tramadol at home, no relief for pain today. A&Ox4. Pt is not ambulatory.

## 2015-02-06 NOTE — ED Notes (Signed)
Pt transported home via [facility] - pts wife at home to meet them

## 2015-02-06 NOTE — ED Notes (Signed)
Wife left number - [phone]

## 2015-02-06 NOTE — ED Provider Notes (Signed)
CSN: [id number]     Arrival date & time 02/06/15  [id number] History   First MD Initiated Contact with Patient 02/06/15 0848     Chief Complaint  Patient presents with  . Prostate Cancer    Pain in bladder   Level V caveat due to dementia The history is provided by the patient.   patient presents with reported bladder spasms. He has a chronic indwelling Foley for recurrent prostate cancer. He is unsure what it was last changed. He has some mild memory issues and states his wife knows more the history. States normally the spasms all but this one is been going on for a while. He does not know if they are not this change. States his wife emptied the bag today. At this time the bag is empty. No fevers or chills. States he has been a little fatigued.  Past Medical History  Diagnosis Date  . Orthostatic lightheadedness   . History of subdural hematoma     LEFT CHRONIC SUBDURAL HEMATOMA   S/P PARIETAL CRANIOTOMY WITH EVACUATION HEMATOMA  . Atrial fibrillation, persistent ([place])   . H/O cardiac radiofrequency ablation     A-FLUTTER  . Cardiac pacemaker last check 08-30-2013    05-05-2007  DD  MEDTRONIC GUIDANT INSIGNIA  . RBBB   . Benign essential tremor   . Diverticulosis of colon   . Tachy-brady syndrome ([place])   . SSS (sick sinus syndrome) ([place])   . Hydronephrosis, bilateral   . Renal insufficiency   . Recurrent prostate carcinoma ([place])     2005  S/P RADIOACTIVE SEED IMPLANTS/   BLADDER NECK MASS RESECTED 05/ 2015  SHOWED RECURRENT PROSTATE CANCER   Past Surgical History  Procedure Laterality Date  . Tonsillectomy  as child  . Subdural hematoma evacuation via craniotomy  12-16-2007  . Rotator cuff repair w/ distal clavicle excision Left 08-31-2010  . Pacemaker insertion  05-05-2007      Guidant Insignia [employer])  . Total knee arthroplasty  12/18/2010    Procedure: TOTAL KNEE ARTHROPLASTY;  Surgeon: Mauri Pole;  [facility]: [facility];  Service: Orthopedics;  Laterality: Left;  . Cataract  extraction    . Cystoscopy w/ ureteral stent placement Bilateral 05/30/2013    Procedure: CYSTOSCOPY WITH RETROGRADE PYELOGRAM/ BILATERAL URETERAL STENT PLACEMENT, TRANSURETRAL RESECTION OF BLADDER TUMOR WITH GYRUS;  Surgeon: Alexis Frock, MD;  [facility];  Service: Urology;  Laterality: Bilateral;  . Cardioversion N/A 08/04/2013    Procedure: CARDIOVERSION;  Surgeon: Sanda Klein, MD;  [facility];  Service: Cardiovascular;  Laterality: N/A;  . Knee arthroscopy Left 02-11-2007  . Cardioversion  multiple--  last one 08-04-2013    SINCE 2008  . Cardiac electrophysiology mapping and ablation  04-06-2007  dr Caryl Comes  . Cardiac catheterization  12-22-2006      non-obstructive CAD / RCA 20%/  ef 50%/  a-fib  . Radioactive prostate seed implants  12-21-2003  . Orif left distal clavical fx  08-31-2010  . Colonoscopy w/ polypectomy  12-01-2008  . Transthoracic echocardiogram  05-31-2013    MILD LVH/  EF 65%/  MODERATE LAE/  TRIVIAL MR  &  TR  . Cystoscopy/retrograde/ureteroscopy Right 09/16/2013    Procedure: CYSTOSCOPY/RETROGRADE/URETEROSCOPIC STENT EXTRACTION/  bil retrograde pyelogram with inseertion of bilateral stents;  Surgeon: Jorja Loa, MD;  Location: [facility];  Service: Urology;  Laterality: Right;  . Cystoscopy w/ ureteral stent placement Bilateral 10/10/2014    Procedure: CYSTOSCOPY, WITH STENT REPLACEMENT, RIGHT URETEROSCOPY ;  Surgeon: Franchot Gallo, MD;  [facility];  Service: Urology;  Laterality: Bilateral;   Family History  Problem Relation Age of Onset  . Aneurysm Father   . Stroke Father    Social History  Substance Use Topics  . Smoking status: Never Smoker   . Smokeless tobacco: Never Used  . Alcohol Use: 12.6 oz/week    7 Glasses of wine, 7 Standard drinks or equivalent, 7 Shots of liquor per week     Comment: 2 glasses wine day    Review of Systems  Unable to perform ROS: Dementia  Gastrointestinal: Positive for  abdominal pain.  Genitourinary: Positive for difficulty urinating.  Skin: Positive for pallor.      Allergies  Review of patient's allergies indicates no known allergies.  Home Medications   Prior to Admission medications   Medication Sig Start Date End Date Taking? Authorizing Provider  donepezil (ARICEPT) 23 MG TABS tablet Take 1 tablet (23 mg total) by mouth at bedtime. 12/20/14   Garvin Fila, MD  ferrous sulfate 325 (65 FE) MG tablet Take 1 tablet (325 mg total) by mouth daily with breakfast. 12/09/14   Donne Hazel, MD  glucosamine-chondroitin 500-400 MG tablet Take 1 tablet by mouth daily as needed (pain).     Historical Provider, MD  levothyroxine (SYNTHROID, LEVOTHROID) 137 MCG tablet Take 137 mcg by mouth daily before breakfast.    Historical Provider, MD  megestrol (MEGACE) 20 MG tablet Take 20 mg by mouth daily.     Historical Provider, MD  mirabegron ER (MYRBETRIQ) 25 MG TB24 tablet Take 25 mg by mouth daily.    Historical Provider, MD  Multiple Vitamin (MULTIVITAMIN WITH MINERALS) TABS tablet Take 1 tablet by mouth daily.    Historical Provider, MD  pramipexole (MIRAPEX) 0.125 MG tablet Take 1 tablet by mouth qhs 09/09/14   Historical Provider, MD  PRESCRIPTION MEDICATION Supportive therapy    [place]    Historical Provider, MD  propafenone (RYTHMOL) 225 MG tablet TAKE 1 TABLET (225 MG TOTAL) BY MOUTH 2 (TWO) TIMES DAILY. 06/06/14   Deboraha Sprang, MD  propranolol (INDERAL) 60 MG tablet Take 60 mg by mouth daily.    Historical Provider, MD  zolpidem (AMBIEN) 10 MG tablet Take 10 mg by mouth at bedtime as needed for sleep.  03/29/14   Historical Provider, MD   BP 114/68 mmHg  Pulse 67  Temp(Src) 97.7 F (36.5 C) (Oral)  Resp 14  SpO2 100% Physical Exam  Constitutional: He appears well-developed.  HENT:  Head: Atraumatic.  Neck: Neck supple.  Cardiovascular: Normal rate.   Pulmonary/Chest: Effort normal.  Abdominal: He exhibits mass. There is tenderness.  Suprapubic  mass.  Musculoskeletal: Normal range of motion.  Neurological: He is alert.  Patient is awake and pleasant with mild confusion.  Skin: There is pallor.   bedside ultrasound shows Foley catheter balloon in place in the bladder but the bladder distended around it filled with fluid.  ED Course  Procedures (including critical care time) Labs Review Labs Reviewed  CBC WITH DIFFERENTIAL/PLATELET - Abnormal; Notable for the following:    WBC 12.4 (*)    RBC 3.80 (*)    Hemoglobin 10.0 (*)    HCT 32.2 (*)    RDW 18.7 (*)    Neutro Abs 10.1 (*)    All other components within normal limits  I-STAT CHEM 8, ED - Abnormal; Notable for the following:    BUN 35 (*)    Creatinine, Ser  1.70 (*)    Glucose, Bld 103 (*)    Hemoglobin 11.6 (*)    HCT 34.0 (*)    All other components within normal limits  URINE CULTURE  URINALYSIS, ROUTINE W REFLEX MICROSCOPIC (NOT AT [facility])    Imaging Review No results found. I have personally reviewed and evaluated these images and lab results as part of my medical decision-making.   EKG Interpretation None      MDM   Final diagnoses:  Obstructed Foley catheter, initial encounter [facility])      Patient with dysuria. Has Foley catheter in place was found to be obstructed. He has not been having fevers or chills. He had recently been on antibiotics for UTI. Feels much better after replacing Foley. Urinalysis may show infection, however will not empirically treat since he does have a catheter in. Culture sent and will need to be followed by either his primary or his urologist. If he develops fevers and chills may need antibiotics sooner.  Davonna Belling, MD 02/06/15 1048

## 2015-02-06 NOTE — ED Notes (Signed)
MD at bedside. 

## 2015-02-06 NOTE — Discharge Instructions (Signed)
If you develop worsening fevers and chills you may need antibiotics for the possible urinary tract infection. Follow-up with Dr. Felipa Eth and Dr. Diona Fanti

## 2015-02-06 NOTE — ED Notes (Signed)
Bed: [id number] Expected date:  Expected time:  Means of arrival:  Comments: EMS - prostate ca

## 2015-02-07 LAB — URINE CULTURE: Culture: >=100000

## 2015-02-08 ENCOUNTER — Encounter (HOSPITAL_COMMUNITY): Payer: Self-pay | Admitting: Family Medicine

## 2015-02-08 ENCOUNTER — Emergency Department (HOSPITAL_COMMUNITY)
Admission: EM | Admit: 2015-02-08 | Discharge: 2015-02-08 | Disposition: A | Discharge status: Home / Self Care | Payer: PPO | Attending: Emergency Medicine | Admitting: Emergency Medicine

## 2015-02-08 DIAGNOSIS — Z79899 Other long term (current) drug therapy: Secondary | ICD-10-CM | POA: Diagnosis not present

## 2015-02-08 DIAGNOSIS — Z978 Presence of other specified devices: Secondary | ICD-10-CM

## 2015-02-08 DIAGNOSIS — Z9889 Other specified postprocedural states: Secondary | ICD-10-CM | POA: Diagnosis not present

## 2015-02-08 DIAGNOSIS — R1030 Lower abdominal pain, unspecified: Secondary | ICD-10-CM | POA: Insufficient documentation

## 2015-02-08 DIAGNOSIS — Z8669 Personal history of other diseases of the nervous system and sense organs: Secondary | ICD-10-CM | POA: Insufficient documentation

## 2015-02-08 DIAGNOSIS — I481 Persistent atrial fibrillation: Secondary | ICD-10-CM | POA: Insufficient documentation

## 2015-02-08 DIAGNOSIS — Z960 Presence of urogenital implants: Secondary | ICD-10-CM | POA: Diagnosis not present

## 2015-02-08 DIAGNOSIS — F0390 Unspecified dementia without behavioral disturbance: Secondary | ICD-10-CM | POA: Diagnosis not present

## 2015-02-08 DIAGNOSIS — Z8719 Personal history of other diseases of the digestive system: Secondary | ICD-10-CM | POA: Diagnosis not present

## 2015-02-08 DIAGNOSIS — R339 Retention of urine, unspecified: Secondary | ICD-10-CM | POA: Insufficient documentation

## 2015-02-08 DIAGNOSIS — Z8546 Personal history of malignant neoplasm of prostate: Secondary | ICD-10-CM | POA: Insufficient documentation

## 2015-02-08 DIAGNOSIS — N189 Chronic kidney disease, unspecified: Secondary | ICD-10-CM | POA: Diagnosis not present

## 2015-02-08 DIAGNOSIS — Z950 Presence of cardiac pacemaker: Secondary | ICD-10-CM | POA: Insufficient documentation

## 2015-02-08 LAB — CBC WITH DIFFERENTIAL/PLATELET
BASOS PCT: 0 %
Basophils Absolute: 0 10*3/uL (ref 0.0–0.1)
EOS ABS: 0.2 10*3/uL (ref 0.0–0.7)
EOS PCT: 2 %
HCT: 30.3 % — ABNORMAL LOW (ref 39.0–52.0)
Hemoglobin: 9.5 g/dL — ABNORMAL LOW (ref 13.0–17.0)
Lymphocytes Relative: 15 %
Lymphs Abs: 1.2 10*3/uL (ref 0.7–4.0)
MCH: 26.5 pg (ref 26.0–34.0)
MCHC: 31.4 g/dL (ref 30.0–36.0)
MCV: 84.6 fL (ref 78.0–100.0)
MONO ABS: 0.4 10*3/uL (ref 0.1–1.0)
MONOS PCT: 5 %
Neutro Abs: 6.5 10*3/uL (ref 1.7–7.7)
Neutrophils Relative %: 78 %
Platelets: 276 10*3/uL (ref 150–400)
RBC: 3.58 MIL/uL — ABNORMAL LOW (ref 4.22–5.81)
RDW: 18.9 % — AB (ref 11.5–15.5)
WBC: 8.4 10*3/uL (ref 4.0–10.5)

## 2015-02-08 LAB — URINE MICROSCOPIC-ADD ON: SQUAMOUS EPITHELIAL / LPF: NONE SEEN

## 2015-02-08 LAB — URINALYSIS, ROUTINE W REFLEX MICROSCOPIC
BILIRUBIN URINE: NEGATIVE
Glucose, UA: NEGATIVE mg/dL
KETONES UR: NEGATIVE mg/dL
NITRITE: NEGATIVE
PH: 5.5 (ref 5.0–8.0)
Protein, ur: 100 mg/dL — AB
Specific Gravity, Urine: 1.017 (ref 1.005–1.030)

## 2015-02-08 LAB — BASIC METABOLIC PANEL
Anion gap: 9 (ref 5–15)
BUN: 30 mg/dL — AB (ref 6–20)
CALCIUM: 9 mg/dL (ref 8.9–10.3)
CO2: 20 mmol/L — AB (ref 22–32)
CREATININE: 1.52 mg/dL — AB (ref 0.61–1.24)
Chloride: 105 mmol/L (ref 101–111)
GFR calc non Af Amer: 41 mL/min — ABNORMAL LOW (ref >60)
GFR, EST AFRICAN AMERICAN: 48 mL/min — AB (ref >60)
GLUCOSE: 104 mg/dL — AB (ref 65–99)
Potassium: 3.8 mmol/L (ref 3.5–5.1)
Sodium: 134 mmol/L — ABNORMAL LOW (ref 135–145)

## 2015-02-08 NOTE — ED Provider Notes (Signed)
CSN: [id number]     Arrival date & time 02/08/15  [id number] History   First MD Initiated Contact with Patient 02/08/15 [phone]     Chief Complaint  Patient presents with  . Urinary Retention    HPI  History limited due to h/o dementia.   [name] is an 82 y.o. male with history of dementia, chronic indwelling foley 2/2 prostate cancer, afib, CKD who presents to the ED for evaluation of possible urinary retention and suprapubic pain. On initial evaluation pt's wife has not arrived to the ED yet so pt's history limited due to dementia. Pt was seen on 1/23 with similar complaints, urine culture obtained, and discharged with instructions for outpatient f/u. Culture reveals candidal growth. Pt states he is here because he continues to have suprapubic pain though improved from prior presentation. He does not think his catheter is obstructed though he notes the urine in the bag is cloudy. He denies n/v/d, fever, chills. He does not know when his next urology appointment is.   Urologist: Franchot Gallo, MD  Past Medical History  Diagnosis Date  . Orthostatic lightheadedness   . History of subdural hematoma     LEFT CHRONIC SUBDURAL HEMATOMA   S/P PARIETAL CRANIOTOMY WITH EVACUATION HEMATOMA  . Atrial fibrillation, persistent ([place])   . H/O cardiac radiofrequency ablation     A-FLUTTER  . Cardiac pacemaker last check 08-30-2013    05-05-2007  DD  MEDTRONIC GUIDANT INSIGNIA  . RBBB   . Benign essential tremor   . Diverticulosis of colon   . Tachy-brady syndrome ([place])   . SSS (sick sinus syndrome) ([place])   . Hydronephrosis, bilateral   . Renal insufficiency   . Recurrent prostate carcinoma ([place])     2005  S/P RADIOACTIVE SEED IMPLANTS/   BLADDER NECK MASS RESECTED 05/ 2015  SHOWED RECURRENT PROSTATE CANCER   Past Surgical History  Procedure Laterality Date  . Tonsillectomy  as child  . Subdural hematoma evacuation via craniotomy  12-16-2007  . Rotator cuff repair w/ distal clavicle excision  Left 08-31-2010  . Pacemaker insertion  05-05-2007      Guidant Insignia [employer])  . Total knee arthroplasty  12/18/2010    Procedure: TOTAL KNEE ARTHROPLASTY;  Surgeon: Mauri Pole;  [facility];  Service: Orthopedics;  Laterality: Left;  . Cataract extraction    . Cystoscopy w/ ureteral stent placement Bilateral 05/30/2013    Procedure: CYSTOSCOPY WITH RETROGRADE PYELOGRAM/ BILATERAL URETERAL STENT PLACEMENT, TRANSURETRAL RESECTION OF BLADDER TUMOR WITH GYRUS;  Surgeon: Alexis Frock, MD;  [facility];  Service: Urology;  Laterality: Bilateral;  . Cardioversion N/A 08/04/2013    Procedure: CARDIOVERSION;  Surgeon: Sanda Klein, MD;  [facility];  Service: Cardiovascular;  Laterality: N/A;  . Knee arthroscopy Left 02-11-2007  . Cardioversion  multiple--  last one 08-04-2013    SINCE 2008  . Cardiac electrophysiology mapping and ablation  04-06-2007  dr Caryl Comes  . Cardiac catheterization  12-22-2006      non-obstructive CAD / RCA 20%/  ef 50%/  a-fib  . Radioactive prostate seed implants  12-21-2003  . Orif left distal clavical fx  08-31-2010  . Colonoscopy w/ polypectomy  12-01-2008  . Transthoracic echocardiogram  05-31-2013    MILD LVH/  EF 65%/  MODERATE LAE/  TRIVIAL MR  &  TR  . Cystoscopy/retrograde/ureteroscopy Right 09/16/2013    Procedure: CYSTOSCOPY/RETROGRADE/URETEROSCOPIC STENT EXTRACTION/  bil retrograde pyelogram with inseertion of bilateral stents;  Surgeon: Jorja Loa, MD;  Location: [place];  Service: Urology;  Laterality: Right;  . Cystoscopy w/ ureteral stent placement Bilateral 10/10/2014    Procedure: CYSTOSCOPY, WITH STENT REPLACEMENT, RIGHT URETEROSCOPY ;  Surgeon: Franchot Gallo, MD;  [facility];  Service: Urology;  Laterality: Bilateral;   Family History  Problem Relation Age of Onset  . Aneurysm Father   . Stroke Father    Social History  Substance Use Topics  . Smoking status: Never Smoker   .  Smokeless tobacco: Never Used  . Alcohol Use: 12.6 oz/week    7 Glasses of wine, 7 Standard drinks or equivalent, 7 Shots of liquor per week     Comment: 2 glasses wine day    Review of Systems  Unable to perform ROS: Dementia      Allergies  Review of patient's allergies indicates no known allergies.  Home Medications   Prior to Admission medications   Medication Sig Start Date End Date Taking? Authorizing Provider  donepezil (ARICEPT) 23 MG TABS tablet Take 1 tablet (23 mg total) by mouth at bedtime. 12/20/14   Garvin Fila, MD  ferrous sulfate 325 (65 FE) MG tablet Take 1 tablet (325 mg total) by mouth daily with breakfast. 12/09/14   Donne Hazel, MD  glucosamine-chondroitin 500-400 MG tablet Take 1 tablet by mouth daily as needed (pain).     Historical Provider, MD  levothyroxine (SYNTHROID, LEVOTHROID) 137 MCG tablet Take 137 mcg by mouth daily before breakfast.    Historical Provider, MD  megestrol (MEGACE) 20 MG tablet Take 20 mg by mouth daily.     Historical Provider, MD  mirabegron ER (MYRBETRIQ) 25 MG TB24 tablet Take 25 mg by mouth daily.    Historical Provider, MD  Multiple Vitamin (MULTIVITAMIN WITH MINERALS) TABS tablet Take 1 tablet by mouth daily.    Historical Provider, MD  pramipexole (MIRAPEX) 0.125 MG tablet Take 1 tablet by mouth qhs 09/09/14   Historical Provider, MD  PRESCRIPTION MEDICATION Supportive therapy    [place]    Historical Provider, MD  propafenone (RYTHMOL) 225 MG tablet TAKE 1 TABLET (225 MG TOTAL) BY MOUTH 2 (TWO) TIMES DAILY. 02/06/15   Deboraha Sprang, MD  propranolol (INDERAL) 60 MG tablet Take 60 mg by mouth daily.    Historical Provider, MD  zolpidem (AMBIEN) 10 MG tablet Take 10 mg by mouth at bedtime as needed for sleep.  03/29/14   Historical Provider, MD   BP 137/65 mmHg  Pulse 60  Temp(Src) 97.7 F (36.5 C) (Oral)  Resp 18  SpO2 99% Physical Exam  Constitutional: He is oriented to person, place, and time. No distress.  Chronically  ill appearing  HENT:  Right Ear: External ear normal.  Left Ear: External ear normal.  Nose: Nose normal.  Mouth/Throat: Oropharynx is clear and moist. No oropharyngeal exudate.  Eyes: Conjunctivae and EOM are normal. Pupils are equal, round, and reactive to light.  Neck: Normal range of motion. Neck supple.  Cardiovascular: Normal rate, regular rhythm, normal heart sounds and intact distal pulses.   Pulmonary/Chest: Effort normal and breath sounds normal. No respiratory distress. He exhibits no tenderness.  Abdominal: Soft. Bowel sounds are normal. He exhibits no distension. There is no rebound and no guarding.  Mild suprapubic tenderness. No guarding. No CVA tenderness.   Genitourinary:  Foley cath in place. Urine is grossly cloudy and brown-red tinged.   Musculoskeletal: He exhibits no edema.  Neurological: He is alert and oriented to person, place, and time.  Skin: Skin is warm and dry. He is not diaphoretic. There is pallor.  Psychiatric: He has a normal mood and affect.  Nursing note and vitals reviewed.   ED Course  Procedures (including critical care time)  Labs Review Labs Reviewed  BASIC METABOLIC PANEL - Abnormal; Notable for the following:    Sodium 134 (*)    CO2 20 (*)    Glucose, Bld 104 (*)    BUN 30 (*)    Creatinine, Ser 1.52 (*)    GFR calc non Af Amer 41 (*)    GFR calc Af Amer 48 (*)    All other components within normal limits  CBC WITH DIFFERENTIAL/PLATELET - Abnormal; Notable for the following:    RBC 3.58 (*)    Hemoglobin 9.5 (*)    HCT 30.3 (*)    RDW 18.9 (*)    All other components within normal limits  URINALYSIS, ROUTINE W REFLEX MICROSCOPIC (NOT AT [facility]) - Abnormal; Notable for the following:    Color, Urine RED (*)    APPearance TURBID (*)    Hgb urine dipstick LARGE (*)    Protein, ur 100 (*)    Leukocytes, UA LARGE (*)    All other components within normal limits  URINE MICROSCOPIC-ADD ON - Abnormal; Notable for the following:     Bacteria, UA MANY (*)    All other components within normal limits  URINE CULTURE    Imaging Review No results found. I have personally reviewed and evaluated these images and lab results as part of my medical decision-making.   EKG Interpretation None      MDM   Final diagnoses:  Chronic indwelling Foley catheter  Suprapubic pain, unspecified laterality    <100 CC on bladder scan. On re-eval pt's wife is in the room and reports pt just finished a course of Cipro for a UTI three days ago. States home health nurse flushed his foley last night. States they currently do not have a urology appointment scheduled. CBC unremarkable and BMP is at baseline, Cr actually improved from baseline at 1.52 today. Discussed with attending MD Audie Pinto. UA is still pending. Pending UA (which I anticipate will show evidence of infection) I will discuss with urology re: whether to treat yeast growth on culture and to facilitate outpatient f/u scheduling.   Spoke to Dr. Alinda Money. Given pt without major clinical signs of true yeast infection (afebrile, no white count, minimal suprapubic tenderness) this is more likely yeast colonization rather than infection. Monday 1/30 11:15 AM appt with Dr. Diona Fanti. Spoke to pt and his wife. Pt's wife feels frustrated and worried that she cannot take care of pt at home and cannot transport pt to his appointments. States this is why EMS was called today. She does not know how she is going to get pt to his uro appt on Monday. She states she has discussed hospice/palliative care with PCP in the past who felt pt did not qualify for hospice care. Will consult case management for transportation assistance. Pt's wife does not feel pt needs to be at assisted living at this time.  Pt was being prepared for discharge when urine noticed to be leaking from foley site. Will place new foley.   New foley in place. Case management discussed transportation issues with pt and his wife. ER return  precautions given. Instructed to f/u with PCP and urology as above.   Anne Ng, PA-C 02/08/15 1603  Leonard Schwartz, MD 02/09/15 [phone]

## 2015-02-08 NOTE — ED Notes (Signed)
Patient had less than 100cc on the bladder scan.

## 2015-02-08 NOTE — Progress Notes (Signed)
Spoke with [facility] [place] [facility] staff Abbie to discussed need for HHSW for community resources, transportation needs and ? Placement questions from wife

## 2015-02-08 NOTE — Progress Notes (Signed)
ED CM noted cm consult reviewed ED PA - MP notes indicating issues with transportation to appointments and Home health ED CM spoke with ED NP - PA about issues and agree patient can be discharged home ED CM spoke with wife who is hard of hearing and agree for CM to touch bases with her after discharge

## 2015-02-08 NOTE — ED Notes (Signed)
Bed: WA14 Expected date:  Expected time:  Means of arrival:  Comments: EMS 

## 2015-02-08 NOTE — Discharge Instructions (Signed)
[name] was seen in the emergency room today for abdominal pain and cloudy urine in his catheter. I spoke to the on-call urologist for [facility] who recommended holding off on any antibiotic or anti-yeast treatment at this time. [name] has an appointment scheduled with Dr. Diona Fanti on Monday 02/13/15 at 11:15 AM. Please call their office to move his appointment sooner if needed. Case management will be calling you to help coordinate transportation assistance.  Return to the ER for new or worsening symptoms.

## 2015-02-08 NOTE — ED Notes (Addendum)
Patient is from home and transported by [facility]. Pt has prostate cancer and has a foley catheter. Pt is complaining of urinary retention and urine is cloudy in foley cath. EMS reported patient is bed-ridden and when transported to [facility], urine started flowing more in the catheter bag.

## 2015-02-08 NOTE — Progress Notes (Addendum)
ED CM met pt and wife  Wife confirmed she could not hear anything on ED rm phone CM reviewed consult Provided pt and wife with transportation [place] at [phone]. [employer] at [phone] in [place] and [phone] in the rest of [place] rides in [place], [place], [place]  CM discussed call to [facility] to add HHSW Confirmed pt continues to be seen by [facility] Informed them CM spoke with [facility] staff and they are aware of ED visit Pt and wife inquired about consult with an urologist to come to [facility] ED to see pt since this is the second visit to [facility] ED this week for same concern and if the foley could be checked, adjusted or changed today Pt and wife voiced concern with returning home until Monday with this continuing.  Cm advocated this for pt by speaking with ED NP/PA and RN Wife and Pt showed CM that since pt was cleaned up earlier in this ED visit pt is presently soiled his ED bed, incontinence pads and present diaper intact and minimal urine in foley on right side of ED bed This was shown to ED PA/NP CM attempted to call [phone] to refer pt but held on line for 10 minutes without answer

## 2015-02-09 ENCOUNTER — Telehealth: Payer: Self-pay | Admitting: *Deleted

## 2015-02-11 LAB — URINE CULTURE: Culture: >=100000

## 2015-02-12 ENCOUNTER — Telehealth (HOSPITAL_COMMUNITY): Payer: Self-pay

## 2015-02-12 NOTE — Telephone Encounter (Signed)
Post ED Visit - Positive Culture Follow-up: Successful Patient Follow-Up  Culture assessed and recommendations reviewed by: []  Elenor Quinones, Pharm.D. []  Heide Guile, Pharm.D., BCPS []  [place]une, Pharm.D. []  Alycia Rossetti, Pharm.D., BCPS []  [place], [place].D., BCPS, AAHIVP []  Legrand Como, Pharm.D., BCPS, AAHIVP []  Milus Glazier, Pharm.D. []  [place], [place].D.  Positive urine culture  [x]  Patient discharged without antimicrobial prescription and treatment is now indicated []  Organism is resistant to prescribed ED discharge antimicrobial []  Patient with positive blood cultures  Changes discussed with ED provider: [facility] pa New antibiotic prescription symptom check.  If symptoms amoxicillin 500mg  po bid x 7 days.    Unable to reach by telephone. Letter sent to address on file Ileene Musa 02/12/2015, 2:16 PM

## 2015-02-13 ENCOUNTER — Telehealth: Payer: Self-pay | Admitting: Neurology

## 2015-02-13 NOTE — Telephone Encounter (Signed)
I called and spoke to the patient's wife. Unfortunately he is not doing well and is bedbound and possibly has a bladder infection. She is hoping that he can pull through this but if his condition declines further she may have to consider hospice

## 2015-02-13 NOTE — Telephone Encounter (Signed)
Wife called to cancel husband's 03/16/14 appointment with Dr. Leonie Man and wants Dr. Leonie Man to know that he is now bedridden.

## 2015-02-13 NOTE — Telephone Encounter (Signed)
Rn call patients wife [name] back about her husband being bedridden. Pts wife stated he is not walking now and is bedridden. She will like for Dr.Sethi to know that.

## 2015-03-02 ENCOUNTER — Telehealth: Payer: Self-pay | Admitting: Internal Medicine

## 2015-03-02 NOTE — Telephone Encounter (Signed)
New MEssage  Pt wife calling to speak w/ RN- had to cancel app tw/ Cecille Rubin- pt is bedridden. Please call back and discuss.

## 2015-03-02 NOTE — Telephone Encounter (Signed)
I called and spoke with the patient's wife. She states he is bedridden and cannot make the appointment with Cecille Rubin next week. This looks to be a 3 month f/u. Per the patient's wife, he is stable from a heart perspective. I advised her this is fine to cancel and we will follow his device remotely. She has been advised to call with any cardiac concerns.

## 2015-03-04 ENCOUNTER — Telehealth (HOSPITAL_COMMUNITY): Payer: Self-pay

## 2015-03-04 NOTE — Telephone Encounter (Signed)
Unable to contact pt by mail or telephone. Unable to communicate lab results or treatment changes. 

## 2015-03-06 ENCOUNTER — Other Ambulatory Visit: Payer: Self-pay | Admitting: Neurology

## 2015-03-07 ENCOUNTER — Ambulatory Visit: Payer: PPO | Admitting: Nurse Practitioner

## 2015-03-15 DIAGNOSIS — R001 Bradycardia, unspecified: Secondary | ICD-10-CM | POA: Diagnosis not present

## 2015-03-16 ENCOUNTER — Ambulatory Visit: Payer: PPO | Admitting: Neurology

## 2015-03-17 ENCOUNTER — Telehealth: Payer: Self-pay | Admitting: Cardiology

## 2015-03-17 NOTE — Telephone Encounter (Signed)
Attempted to call pt and make him aware of his implanted device reaching ERI. No answer and unable to leave a message.

## 2015-03-17 NOTE — Telephone Encounter (Signed)
abnormal remote w/ [employer]. Pt device has reached ERI. Girard Cooter is faxing report.

## 2015-03-20 NOTE — Telephone Encounter (Signed)
Sent message to schedulers to make appt to discuss generator change.  Chanetta Marshall, NP 03/20/2015 5:19 PM

## 2015-03-24 NOTE — Telephone Encounter (Signed)
Spoke w/ pt wife and informed her that we received an alert from [facility] that pt device has reached ERI. Pt wife informed me that pt is bed bond and unable to come into the office. Informed pt wife that I would make MD aware. She verbalized understanding.

## 2015-03-25 ENCOUNTER — Other Ambulatory Visit: Payer: Self-pay | Admitting: Internal Medicine

## 2015-03-31 NOTE — Telephone Encounter (Signed)
H can u help me remember to get up w Corene Cornea Bx next week to go by his house  And reprogram device to vvi 40 so we can see how dependent he is as he is now bed bound

## 2015-04-06 NOTE — Telephone Encounter (Signed)
Dr. Caryl Comes- texted Corene Cornea (BSX) twice this week about the patient.

## 2015-04-11 NOTE — Telephone Encounter (Signed)
Jason reprogrammed device to DDD 40 with Denean Pavon AV delays. He will go back this week to reprogram device to VVI 40bpm. He will keep [place] updated with data/programming.

## 2015-04-17 ENCOUNTER — Encounter (HOSPITAL_COMMUNITY): Payer: Self-pay | Admitting: Emergency Medicine

## 2015-04-17 ENCOUNTER — Emergency Department (HOSPITAL_COMMUNITY)
Admission: EM | Admit: 2015-04-17 | Discharge: 2015-04-17 | Disposition: A | Discharge status: Home / Self Care | Payer: PPO | Attending: Emergency Medicine | Admitting: Emergency Medicine

## 2015-04-17 DIAGNOSIS — Y658 Other specified misadventures during surgical and medical care: Secondary | ICD-10-CM | POA: Diagnosis not present

## 2015-04-17 DIAGNOSIS — Z8719 Personal history of other diseases of the digestive system: Secondary | ICD-10-CM | POA: Insufficient documentation

## 2015-04-17 DIAGNOSIS — Z8546 Personal history of malignant neoplasm of prostate: Secondary | ICD-10-CM | POA: Diagnosis not present

## 2015-04-17 DIAGNOSIS — Z79899 Other long term (current) drug therapy: Secondary | ICD-10-CM | POA: Insufficient documentation

## 2015-04-17 DIAGNOSIS — T83098A Other mechanical complication of other indwelling urethral catheter, initial encounter: Secondary | ICD-10-CM | POA: Insufficient documentation

## 2015-04-17 DIAGNOSIS — Z9889 Other specified postprocedural states: Secondary | ICD-10-CM | POA: Insufficient documentation

## 2015-04-17 DIAGNOSIS — Z950 Presence of cardiac pacemaker: Secondary | ICD-10-CM | POA: Insufficient documentation

## 2015-04-17 DIAGNOSIS — Z87828 Personal history of other (healed) physical injury and trauma: Secondary | ICD-10-CM | POA: Diagnosis not present

## 2015-04-17 DIAGNOSIS — R339 Retention of urine, unspecified: Secondary | ICD-10-CM | POA: Diagnosis present

## 2015-04-17 DIAGNOSIS — I480 Paroxysmal atrial fibrillation: Secondary | ICD-10-CM | POA: Insufficient documentation

## 2015-04-17 DIAGNOSIS — Z87448 Personal history of other diseases of urinary system: Secondary | ICD-10-CM | POA: Diagnosis not present

## 2015-04-17 DIAGNOSIS — T83091A Other mechanical complication of indwelling urethral catheter, initial encounter: Secondary | ICD-10-CM

## 2015-04-17 LAB — CBC WITH DIFFERENTIAL/PLATELET
BASOS ABS: 0 10*3/uL (ref 0.0–0.1)
Basophils Relative: 0 %
EOS ABS: 0.2 10*3/uL (ref 0.0–0.7)
EOS PCT: 1 %
HCT: 27.8 % — ABNORMAL LOW (ref 39.0–52.0)
HEMOGLOBIN: 9 g/dL — AB (ref 13.0–17.0)
Lymphocytes Relative: 8 %
Lymphs Abs: 1.1 10*3/uL (ref 0.7–4.0)
MCH: 27.4 pg (ref 26.0–34.0)
MCHC: 32.4 g/dL (ref 30.0–36.0)
MCV: 84.8 fL (ref 78.0–100.0)
Monocytes Absolute: 0.6 10*3/uL (ref 0.1–1.0)
Monocytes Relative: 4 %
NEUTROS PCT: 87 %
Neutro Abs: 11.3 10*3/uL — ABNORMAL HIGH (ref 1.7–7.7)
PLATELETS: 329 10*3/uL (ref 150–400)
RBC: 3.28 MIL/uL — AB (ref 4.22–5.81)
RDW: 15.9 % — ABNORMAL HIGH (ref 11.5–15.5)
WBC: 13.2 10*3/uL — AB (ref 4.0–10.5)

## 2015-04-17 LAB — URINE MICROSCOPIC-ADD ON

## 2015-04-17 LAB — BASIC METABOLIC PANEL
ANION GAP: 11 (ref 5–15)
BUN: 33 mg/dL — ABNORMAL HIGH (ref 6–20)
CO2: 17 mmol/L — ABNORMAL LOW (ref 22–32)
Calcium: 9 mg/dL (ref 8.9–10.3)
Chloride: 107 mmol/L (ref 101–111)
Creatinine, Ser: 1.88 mg/dL — ABNORMAL HIGH (ref 0.61–1.24)
GFR, EST AFRICAN AMERICAN: 37 mL/min — AB (ref >60)
GFR, EST NON AFRICAN AMERICAN: 32 mL/min — AB (ref >60)
Glucose, Bld: 140 mg/dL — ABNORMAL HIGH (ref 65–99)
POTASSIUM: 4.3 mmol/L (ref 3.5–5.1)
SODIUM: 135 mmol/L (ref 135–145)

## 2015-04-17 LAB — URINALYSIS, ROUTINE W REFLEX MICROSCOPIC
BILIRUBIN URINE: NEGATIVE
Glucose, UA: NEGATIVE mg/dL
Ketones, ur: 15 mg/dL — AB
NITRITE: NEGATIVE
PROTEIN: 100 mg/dL — AB
Specific Gravity, Urine: 1.013 (ref 1.005–1.030)
pH: 6 (ref 5.0–8.0)

## 2015-04-17 NOTE — Discharge Instructions (Signed)
Foley Catheter Care, Adult °A Foley catheter is a soft, flexible tube that is placed into the bladder to drain urine. A Foley catheter may be inserted if: °· You leak urine or are not able to control when you urinate (urinary incontinence). °· You are not able to urinate when you need to (urinary retention). °· You had prostate surgery or surgery on the genitals. °· You have certain medical conditions, such as multiple sclerosis, dementia, or a spinal cord injury. °If you are going home with a Foley catheter in place, follow the instructions below. °TAKING CARE OF THE CATHETER °1. Wash your hands with soap and water. °2. Using mild soap and warm water on a clean washcloth: °¨ Clean the area on your body closest to the catheter insertion site using a circular motion, moving away from the catheter. Never wipe toward the catheter because this could sweep bacteria up into the urethra and cause infection. °¨ Remove all traces of soap. Pat the area dry with a clean towel. For males, reposition the foreskin. °3. Attach the catheter to your leg so there is no tension on the catheter. Use adhesive tape or a leg strap. If you are using adhesive tape, remove any sticky residue left behind by the previous tape you used. °4. Keep the drainage bag below the level of the bladder, but keep it off the floor. °5. Check throughout the day to be sure the catheter is working and urine is draining freely. Make sure the tubing does not become kinked. °6. Do not pull on the catheter or try to remove it. Pulling could damage internal tissues. °TAKING CARE OF THE DRAINAGE BAGS °You will be given two drainage bags to take home. One is a large overnight drainage bag, and the other is a smaller leg bag that fits underneath clothing. You may wear the overnight bag at any time, but you should never wear the smaller leg bag at night. Follow the instructions below for how to empty, change, and clean your drainage bags. °Emptying the Drainage  Bag °You must empty your drainage bag when it is  -½ full or at least 2-3 times a day. °1. Wash your hands with soap and water. °2. Keep the drainage bag below your hips, below the level of your bladder. This stops urine from going back into the tubing and into your bladder. °3. Hold the dirty bag over the toilet or a clean container. °4. Open the pour spout at the bottom of the bag and empty the urine into the toilet or container. Do not let the pour spout touch the toilet, container, or any other surface. Doing so can place bacteria on the bag, which can cause an infection. °5. Clean the pour spout with a gauze pad or cotton ball that has rubbing alcohol on it. °6. Close the pour spout. °7. Attach the bag to your leg with adhesive tape or a leg strap. °8. Wash your hands well. °Changing the Drainage Bag °Change your drainage bag once a month or sooner if it starts to smell bad or look dirty. Below are steps to follow when changing the drainage bag. °1. Wash your hands with soap and water. °2. Pinch off the rubber catheter so that urine does not spill out. °3. Disconnect the catheter tube from the drainage tube at the connection valve. Do not let the tubes touch any surface. °4. Clean the end of the catheter tube with an alcohol wipe. Use a different alcohol wipe to clean   the end of the drainage tube. °5. Connect the catheter tube to the drainage tube of the clean drainage bag. °6. Attach the new bag to the leg with adhesive tape or a leg strap. Avoid attaching the new bag too tightly. °7. Wash your hands well. °Cleaning the Drainage Bag °1. Wash your hands with soap and water. °2. Wash the bag in warm, soapy water. °3. Rinse the bag thoroughly with warm water. °4. Fill the bag with a solution of white vinegar and water (1 cup vinegar to 1 qt warm water [.2 L vinegar to 1 L warm water]). Close the bag and soak it for 30 minutes in the solution. °5. Rinse the bag with warm water. °6. Hang the bag to dry with the  pour spout open and hanging downward. °7. Store the clean bag (once it is dry) in a clean plastic bag. °8. Wash your hands well. °PREVENTING INFECTION °· Wash your hands before and after handling your catheter. °· Take showers daily and wash the area where the catheter enters your body. Do not take baths. Replace wet leg straps with dry ones, if this applies. °· Do not use powders, sprays, or lotions on the genital area. Only use creams, lotions, or ointments as directed by your caregiver. °· For females, wipe from front to back after each bowel movement. °· Drink enough fluids to keep your urine clear or pale yellow unless you have a fluid restriction. °· Do not let the drainage bag or tubing touch or lie on the floor. °· Wear cotton underwear to absorb moisture and to keep your skin drier. °SEEK MEDICAL CARE IF:  °· Your urine is cloudy or smells unusually bad. °· Your catheter becomes clogged. °· You are not draining urine into the bag or your bladder feels full. °· Your catheter starts to leak. °SEEK IMMEDIATE MEDICAL CARE IF:  °· You have pain, swelling, redness, or pus where the catheter enters the body. °· You have pain in the abdomen, legs, lower back, or bladder. °· You have a fever. °· You see blood fill the catheter, or your urine is pink or red. °· You have nausea, vomiting, or chills. °· Your catheter gets pulled out. °MAKE SURE YOU:  °· Understand these instructions. °· Will watch your condition. °· Will get help right away if you are not doing well or get worse. °  °This information is not intended to replace advice given to you by your health care provider. Make sure you discuss any questions you have with your health care provider. °  °Document Released: 12/31/2004 Document Revised: 05/17/2013 Document Reviewed: 12/23/2011 °Elsevier Interactive Patient Education ©2016 [employer] ° °

## 2015-04-17 NOTE — ED Provider Notes (Signed)
CSN: [id number]     Arrival date & time 04/17/15  0055 History   By signing my name below, I, Forrestine Him, attest that this documentation has been prepared under the direction and in the presence of Merryl Hacker, MD.  Electronically Signed: Forrestine Him, ED Scribe. 04/17/2015. 1:31 AM.   Chief Complaint  Patient presents with  . Urinary Retention   The history is provided by the patient. No language interpreter was used.    HPI Comments: SWAYDE EMANUELSON brought in by EMS is a 82 y.o. male with a PMHx of A-Fib, RBBB, renal insufficiency, and recurrent prostrate carcinoma who presents to the Emergency Department here for urinary retention this evening. Pt reports constant, ongoing "bladder spasms" and "feeling like i need to have bowel movement". He states at approximately 10:00 PM this evening he noted that his indwelling catheter was no longer draining as normal. Catheter was checked earlier that evening without any issues. No recent fever, chills, nausea, vomiting, or back pain. Mr. Coletti typically has his catheter changed out every month. No known allergies to medications.  PCP: [name], MD    Past Medical History  Diagnosis Date  . Orthostatic lightheadedness   . History of subdural hematoma     LEFT CHRONIC SUBDURAL HEMATOMA   S/P PARIETAL CRANIOTOMY WITH EVACUATION HEMATOMA  . Atrial fibrillation, persistent ([place])   . H/O cardiac radiofrequency ablation     A-FLUTTER  . Cardiac pacemaker last check 08-30-2013    05-05-2007  DD  [employer] GUIDANT INSIGNIA  . RBBB   . Benign essential tremor   . Diverticulosis of colon   . Tachy-brady syndrome ([place])   . SSS (sick sinus syndrome) ([place])   . Hydronephrosis, bilateral   . Renal insufficiency   . Recurrent prostate carcinoma ([place])     2005  S/P RADIOACTIVE SEED IMPLANTS/   BLADDER NECK MASS RESECTED 05/ 2015  SHOWED RECURRENT PROSTATE CANCER   Past Surgical History  Procedure Laterality Date  . Tonsillectomy   as child  . Subdural hematoma evacuation via craniotomy  12-16-2007  . Rotator cuff repair w/ distal clavicle excision Left 08-31-2010  . Pacemaker insertion  05-05-2007      Guidant Insignia [employer])  . Total knee arthroplasty  12/18/2010    Procedure: TOTAL KNEE ARTHROPLASTY;  Surgeon: Mauri Pole;  Location: [facility];  Service: Orthopedics;  Laterality: Left;  . Cataract extraction    . Cystoscopy w/ ureteral stent placement Bilateral 05/30/2013    Procedure: CYSTOSCOPY WITH RETROGRADE PYELOGRAM/ BILATERAL URETERAL STENT PLACEMENT, TRANSURETRAL RESECTION OF BLADDER TUMOR WITH GYRUS;  Surgeon: Alexis Frock, MD;  Location: [facility];  Service: Urology;  Laterality: Bilateral;  . Cardioversion N/A 08/04/2013    Procedure: CARDIOVERSION;  Surgeon: Sanda Klein, MD;  Location: [facility];  Service: Cardiovascular;  Laterality: N/A;  . Knee arthroscopy Left 02-11-2007  . Cardioversion  multiple--  last one 08-04-2013    SINCE 2008  . Cardiac electrophysiology mapping and ablation  04-06-2007  dr Caryl Comes  . Cardiac catheterization  12-22-2006      non-obstructive CAD / RCA 20%/  ef 50%/  a-fib  . Radioactive prostate seed implants  12-21-2003  . Orif left distal clavical fx  08-31-2010  . Colonoscopy w/ polypectomy  12-01-2008  . Transthoracic echocardiogram  05-31-2013    MILD LVH/  EF 65%/  MODERATE LAE/  TRIVIAL MR  &  TR  . Cystoscopy/retrograde/ureteroscopy Right 09/16/2013    Procedure: CYSTOSCOPY/RETROGRADE/URETEROSCOPIC STENT EXTRACTION/  bil retrograde pyelogram with inseertion of bilateral stents;  Surgeon: Jorja Loa, MD;  Location[facility];  Service: Urology;  Laterality: Right;  . Cystoscopy w/ ureteral stent placement Bilateral 10/10/2014    Procedure: CYSTOSCOPY, WITH STENT REPLACEMENT, RIGHT URETEROSCOPY ;  Surgeon: Franchot Gallo, MD;  Location: [facility];  Service: Urology;  Laterality: Bilateral;   Family History  Problem Relation Age of Onset  .  Aneurysm Father   . Stroke Father    Social History  Substance Use Topics  . Smoking status: Never Smoker   . Smokeless tobacco: Never Used  . Alcohol Use: 12.6 oz/week    7 Glasses of wine, 7 Standard drinks or equivalent, 7 Shots of liquor per week     Comment: 2 glasses wine day    Review of Systems  Constitutional: Negative for fever and chills.  Respiratory: Negative for shortness of breath.   Cardiovascular: Negative for chest pain.  Gastrointestinal: Negative for nausea, vomiting, abdominal pain and diarrhea.  Genitourinary: Positive for difficulty urinating.  Neurological: Negative for headaches.  Psychiatric/Behavioral: Negative for confusion.  All other systems reviewed and are negative.     Allergies  Review of patient's allergies indicates no known allergies.  Home Medications   Prior to Admission medications   Medication Sig Start Date End Date Taking? Authorizing Provider  donepezil (ARICEPT) 23 MG TABS tablet TAKE 1 TABLET (23 MG TOTAL) BY MOUTH AT BEDTIME. 03/07/15  Yes Garvin Fila, MD  HYDROcodone-acetaminophen (NORCO/VICODIN) 5-325 MG tablet Take 1 tablet by mouth every 6 (six) hours as needed for moderate pain.   Yes Historical Provider, MD  levothyroxine (SYNTHROID, LEVOTHROID) 137 MCG tablet Take 137 mcg by mouth daily before breakfast.   Yes Historical Provider, MD  megestrol (MEGACE) 20 MG tablet Take 20 mg by mouth 2 (two) times daily.    Yes Historical Provider, MD  mirabegron ER (MYRBETRIQ) 25 MG TB24 tablet Take 25 mg by mouth daily.   Yes Historical Provider, MD  Multiple Vitamin (MULTIVITAMIN WITH MINERALS) TABS tablet Take 1 tablet by mouth daily.   Yes Historical Provider, MD  pramipexole (MIRAPEX) 0.25 MG tablet Take 0.25 mg by mouth at bedtime. 01/27/15  Yes Historical Provider, MD  Probiotic Product (PROBIOTIC PO) Take 1 tablet by mouth daily.   Yes Historical Provider, MD  propafenone (RYTHMOL) 225 MG tablet TAKE 1 TABLET (225 MG TOTAL) BY  MOUTH 2 (TWO) TIMES DAILY. Patient taking differently: TAKE 1 TABLET (225 MG TOTAL) BY MOUTH DAILY 02/06/15  Yes Deboraha Sprang, MD  propranolol ER (INDERAL LA) 60 MG 24 hr capsule TAKE ONE CAPSULE BY MOUTH EVERY DAY 03/27/15  Yes Deboraha Sprang, MD  zolpidem (AMBIEN) 10 MG tablet Take 10 mg by mouth at bedtime as needed for sleep.  03/29/14  Yes Historical Provider, MD   Triage Vitals: BP 135/62 mmHg  Pulse 61  Temp(Src) 98.7 F (37.1 C) (Oral)  Ht 6' (1.829 m)  Wt 170 lb (77.111 kg)  BMI 23.05 kg/m2  SpO2 100%   Physical Exam  Constitutional: He is oriented to person, place, and time. He appears well-developed and well-nourished. No distress.  HENT:  Head: Normocephalic and atraumatic.  Cardiovascular: Normal rate, regular rhythm and normal heart sounds.   Pulmonary/Chest: Effort normal. No respiratory distress.  Abdominal: Soft. Bowel sounds are normal. There is no tenderness. There is no rebound.  Genitourinary:  Foley catheter in place, blood noted at the meatus  Musculoskeletal: He exhibits no edema.  Neurological: He is alert and oriented to person, place, and time.  Skin: Skin is warm and dry.  Psychiatric: He has a normal mood and affect.  Nursing note and vitals reviewed.   ED Course  Procedures (including critical care time)  DIAGNOSTIC STUDIES: Oxygen Saturation is 95% on RA, adequate by my interpretation.    COORDINATION OF CARE: 1:25 AM- Will order blood work and urinalysis. Discussed treatment plan with pt at bedside and pt agreed to plan.     Labs Review Labs Reviewed  URINALYSIS, ROUTINE W REFLEX MICROSCOPIC (NOT AT [facility]) - Abnormal; Notable for the following:    Color, Urine RED (*)    APPearance TURBID (*)    Hgb urine dipstick LARGE (*)    Ketones, ur 15 (*)    Protein, ur 100 (*)    Leukocytes, UA LARGE (*)    All other components within normal limits  CBC WITH DIFFERENTIAL/PLATELET - Abnormal; Notable for the following:    WBC 13.2 (*)    RBC 3.28  (*)    Hemoglobin 9.0 (*)    HCT 27.8 (*)    RDW 15.9 (*)    Neutro Abs 11.3 (*)    All other components within normal limits  BASIC METABOLIC PANEL - Abnormal; Notable for the following:    CO2 17 (*)    Glucose, Bld 140 (*)    BUN 33 (*)    Creatinine, Ser 1.88 (*)    GFR calc non Af Amer 32 (*)    GFR calc Af Amer 37 (*)    All other components within normal limits  URINE MICROSCOPIC-ADD ON - Abnormal; Notable for the following:    Squamous Epithelial / LPF 0-5 (*)    Bacteria, UA MANY (*)    All other components within normal limits  URINE CULTURE    Imaging Review No results found. I have personally reviewed and evaluated these images and lab results as part of my medical decision-making.   EKG Interpretation None      MDM   Final diagnoses:  Obstruction of Foley catheter, initial encounter [facility])    Patient presents with bladder spasming and concern for Foley catheter obstruction. Wife states that the Foley has not been draining this afternoon. Nursing noted that the Foley catheter had dislodged and was in his urethra. Upon replacement, patient had immediate output of 600 mL in addition to a fair amount of output lost on the sheets. He is afebrile and otherwise nontoxic-appearing. He feels much better after Foley has been replaced. Urinalysis does show too numerous to count white cells and many bacteria. He is likely colonized from chronic catheter placement, will obtain a urine culture but defer treatment at this time. Creatinine is at baseline. He does have a mild leukocytosis. Denies back pain or fevers. Again will defer treatment given chronic Foley placement and otherwise well-appearing patient.  After history, exam, and medical workup I feel the patient has been appropriately medically screened and is safe for discharge home. Pertinent diagnoses were discussed with the patient. Patient was given return precautions.  I personally performed the services described in  this documentation, which was scribed in my presence. The recorded information has been reviewed and is accurate.   Merryl Hacker, MD 04/17/15 [phone]

## 2015-04-17 NOTE — ED Notes (Addendum)
[facility] called to transport patient home 

## 2015-04-17 NOTE — ED Notes (Signed)
Pt here via [facility], from home, with c/o "bladder spasms" and "feeling like I need to have a bowel movement". Pt has indwelling foley that he states is replaced monthly due to h/o prostate cancer. Reports h/o catheter blockage, as well.

## 2015-04-17 NOTE — ED Notes (Signed)
Pt's wife stated understanding of discharge orders and follow-up care. Pt to be transported home via [facility] due to pt being unable to ambulate.

## 2015-04-18 LAB — URINE CULTURE

## 2015-05-01 ENCOUNTER — Encounter (HOSPITAL_COMMUNITY): Payer: Self-pay | Admitting: Emergency Medicine

## 2015-05-01 ENCOUNTER — Inpatient Hospital Stay (HOSPITAL_COMMUNITY)
Admission: EM | Admit: 2015-05-01 | Discharge: 2015-05-04 | DRG: 309 | Disposition: A | Discharge status: Home Health | Payer: PPO | Attending: Internal Medicine | Admitting: Internal Medicine

## 2015-05-01 ENCOUNTER — Emergency Department (HOSPITAL_COMMUNITY): Payer: PPO

## 2015-05-01 DIAGNOSIS — I4430 Unspecified atrioventricular block: Secondary | ICD-10-CM | POA: Diagnosis present

## 2015-05-01 DIAGNOSIS — N183 Chronic kidney disease, stage 3 unspecified: Secondary | ICD-10-CM | POA: Diagnosis present

## 2015-05-01 DIAGNOSIS — I679 Cerebrovascular disease, unspecified: Secondary | ICD-10-CM | POA: Diagnosis present

## 2015-05-01 DIAGNOSIS — I480 Paroxysmal atrial fibrillation: Secondary | ICD-10-CM

## 2015-05-01 DIAGNOSIS — F0390 Unspecified dementia without behavioral disturbance: Secondary | ICD-10-CM | POA: Diagnosis present

## 2015-05-01 DIAGNOSIS — Z87440 Personal history of urinary (tract) infections: Secondary | ICD-10-CM | POA: Diagnosis not present

## 2015-05-01 DIAGNOSIS — D509 Iron deficiency anemia, unspecified: Secondary | ICD-10-CM | POA: Diagnosis present

## 2015-05-01 DIAGNOSIS — E860 Dehydration: Secondary | ICD-10-CM | POA: Diagnosis present

## 2015-05-01 DIAGNOSIS — G629 Polyneuropathy, unspecified: Secondary | ICD-10-CM | POA: Diagnosis present

## 2015-05-01 DIAGNOSIS — Z823 Family history of stroke: Secondary | ICD-10-CM | POA: Diagnosis not present

## 2015-05-01 DIAGNOSIS — E039 Hypothyroidism, unspecified: Secondary | ICD-10-CM | POA: Diagnosis present

## 2015-05-01 DIAGNOSIS — R55 Syncope and collapse: Secondary | ICD-10-CM | POA: Diagnosis present

## 2015-05-01 DIAGNOSIS — A419 Sepsis, unspecified organism: Secondary | ICD-10-CM | POA: Diagnosis present

## 2015-05-01 DIAGNOSIS — I4510 Unspecified right bundle-branch block: Secondary | ICD-10-CM | POA: Diagnosis present

## 2015-05-01 DIAGNOSIS — L89159 Pressure ulcer of sacral region, unspecified stage: Secondary | ICD-10-CM | POA: Diagnosis present

## 2015-05-01 DIAGNOSIS — Z7401 Bed confinement status: Secondary | ICD-10-CM | POA: Diagnosis not present

## 2015-05-01 DIAGNOSIS — R159 Full incontinence of feces: Secondary | ICD-10-CM | POA: Diagnosis present

## 2015-05-01 DIAGNOSIS — G250 Essential tremor: Secondary | ICD-10-CM | POA: Diagnosis present

## 2015-05-01 DIAGNOSIS — I481 Persistent atrial fibrillation: Secondary | ICD-10-CM | POA: Diagnosis present

## 2015-05-01 DIAGNOSIS — N1330 Unspecified hydronephrosis: Secondary | ICD-10-CM | POA: Diagnosis present

## 2015-05-01 DIAGNOSIS — I2510 Atherosclerotic heart disease of native coronary artery without angina pectoris: Secondary | ICD-10-CM | POA: Diagnosis present

## 2015-05-01 DIAGNOSIS — I495 Sick sinus syndrome: Secondary | ICD-10-CM | POA: Diagnosis present

## 2015-05-01 DIAGNOSIS — Z8673 Personal history of transient ischemic attack (TIA), and cerebral infarction without residual deficits: Secondary | ICD-10-CM | POA: Diagnosis not present

## 2015-05-01 DIAGNOSIS — R21 Rash and other nonspecific skin eruption: Secondary | ICD-10-CM | POA: Diagnosis present

## 2015-05-01 DIAGNOSIS — C61 Malignant neoplasm of prostate: Secondary | ICD-10-CM | POA: Diagnosis present

## 2015-05-01 DIAGNOSIS — Z9181 History of falling: Secondary | ICD-10-CM

## 2015-05-01 DIAGNOSIS — N171 Acute kidney failure with acute cortical necrosis: Secondary | ICD-10-CM

## 2015-05-01 DIAGNOSIS — R296 Repeated falls: Secondary | ICD-10-CM | POA: Diagnosis present

## 2015-05-01 DIAGNOSIS — R531 Weakness: Secondary | ICD-10-CM | POA: Diagnosis present

## 2015-05-01 DIAGNOSIS — Z66 Do not resuscitate: Secondary | ICD-10-CM | POA: Diagnosis present

## 2015-05-01 DIAGNOSIS — R001 Bradycardia, unspecified: Secondary | ICD-10-CM | POA: Insufficient documentation

## 2015-05-01 DIAGNOSIS — Z950 Presence of cardiac pacemaker: Secondary | ICD-10-CM | POA: Diagnosis not present

## 2015-05-01 DIAGNOSIS — I4891 Unspecified atrial fibrillation: Secondary | ICD-10-CM | POA: Diagnosis present

## 2015-05-01 DIAGNOSIS — D631 Anemia in chronic kidney disease: Secondary | ICD-10-CM | POA: Diagnosis present

## 2015-05-01 DIAGNOSIS — N390 Urinary tract infection, site not specified: Secondary | ICD-10-CM | POA: Diagnosis present

## 2015-05-01 DIAGNOSIS — Z515 Encounter for palliative care: Secondary | ICD-10-CM | POA: Diagnosis present

## 2015-05-01 DIAGNOSIS — Z923 Personal history of irradiation: Secondary | ICD-10-CM | POA: Diagnosis not present

## 2015-05-01 DIAGNOSIS — N189 Chronic kidney disease, unspecified: Secondary | ICD-10-CM | POA: Diagnosis not present

## 2015-05-01 DIAGNOSIS — L8990 Pressure ulcer of unspecified site, unspecified stage: Secondary | ICD-10-CM | POA: Diagnosis present

## 2015-05-01 DIAGNOSIS — W19XXXA Unspecified fall, initial encounter: Secondary | ICD-10-CM | POA: Diagnosis not present

## 2015-05-01 LAB — BASIC METABOLIC PANEL
ANION GAP: 11 (ref 5–15)
BUN: 34 mg/dL — AB (ref 6–20)
CHLORIDE: 108 mmol/L (ref 101–111)
CO2: 18 mmol/L — ABNORMAL LOW (ref 22–32)
CREATININE: 1.58 mg/dL — AB (ref 0.61–1.24)
Calcium: 9.5 mg/dL (ref 8.9–10.3)
GFR calc Af Amer: 46 mL/min — ABNORMAL LOW (ref >60)
GFR, EST NON AFRICAN AMERICAN: 39 mL/min — AB (ref >60)
GLUCOSE: 110 mg/dL — AB (ref 65–99)
POTASSIUM: 4.4 mmol/L (ref 3.5–5.1)
SODIUM: 137 mmol/L (ref 135–145)

## 2015-05-01 LAB — URINE MICROSCOPIC-ADD ON: Squamous Epithelial / LPF: NONE SEEN

## 2015-05-01 LAB — CBC WITH DIFFERENTIAL/PLATELET
Basophils Absolute: 0 10*3/uL (ref 0.0–0.1)
Basophils Relative: 0 %
EOS PCT: 2 %
Eosinophils Absolute: 0.2 10*3/uL (ref 0.0–0.7)
HCT: 39.2 % (ref 39.0–52.0)
Hemoglobin: 13 g/dL (ref 13.0–17.0)
LYMPHS ABS: 1.3 10*3/uL (ref 0.7–4.0)
LYMPHS PCT: 13 %
MCH: 30.7 pg (ref 26.0–34.0)
MCHC: 33.2 g/dL (ref 30.0–36.0)
MCV: 92.7 fL (ref 78.0–100.0)
MONO ABS: 0.5 10*3/uL (ref 0.1–1.0)
Monocytes Relative: 5 %
Neutro Abs: 8.5 10*3/uL — ABNORMAL HIGH (ref 1.7–7.7)
Neutrophils Relative %: 81 %
PLATELETS: 219 10*3/uL (ref 150–400)
RBC: 4.23 MIL/uL (ref 4.22–5.81)
RDW: 14.2 % (ref 11.5–15.5)
WBC: 22.3 10*3/uL — ABNORMAL HIGH (ref 4.0–10.5)

## 2015-05-01 LAB — LACTIC ACID, PLASMA
LACTIC ACID, VENOUS: 1.4 mmol/L (ref 0.5–2.0)
LACTIC ACID, VENOUS: 1.5 mmol/L (ref 0.5–2.0)

## 2015-05-01 LAB — URINALYSIS, ROUTINE W REFLEX MICROSCOPIC
BILIRUBIN URINE: NEGATIVE
GLUCOSE, UA: NEGATIVE mg/dL
KETONES UR: 15 mg/dL — AB
Nitrite: NEGATIVE
Specific Gravity, Urine: 1.017 (ref 1.005–1.030)
pH: 5.5 (ref 5.0–8.0)

## 2015-05-01 LAB — CREATININE, SERUM
CREATININE: 1.68 mg/dL — AB (ref 0.61–1.24)
GFR calc non Af Amer: 37 mL/min — ABNORMAL LOW (ref >60)
GFR, EST AFRICAN AMERICAN: 42 mL/min — AB (ref >60)

## 2015-05-01 LAB — MAGNESIUM: MAGNESIUM: 1.9 mg/dL (ref 1.7–2.4)

## 2015-05-01 LAB — PROTIME-INR
INR: 1.3 (ref 0.00–1.49)
PROTHROMBIN TIME: 16.3 s — AB (ref 11.6–15.2)

## 2015-05-01 LAB — TSH: TSH: 0.504 u[IU]/mL (ref 0.350–4.500)

## 2015-05-01 LAB — CORTISOL: Cortisol, Plasma: 14.7 ug/dL

## 2015-05-01 LAB — PROCALCITONIN

## 2015-05-01 LAB — APTT: aPTT: 33 seconds (ref 24–37)

## 2015-05-01 MED ORDER — MIRABEGRON ER 25 MG PO TB24
25.0000 mg | ORAL_TABLET | Freq: Every day | ORAL | Status: DC
  Administered 2015-05-02 – 2015-05-04 (×3): 25 mg via ORAL
  Filled 2015-05-01 (×4): qty 1

## 2015-05-01 MED ORDER — PIPERACILLIN-TAZOBACTAM 3.375 G IVPB
3.3750 g | Freq: Three times a day (TID) | INTRAVENOUS | Status: DC
  Administered 2015-05-02 – 2015-05-03 (×4): 3.375 g via INTRAVENOUS
  Filled 2015-05-01 (×6): qty 50

## 2015-05-01 MED ORDER — VANCOMYCIN HCL 500 MG IV SOLR
500.0000 mg | Freq: Once | INTRAVENOUS | Status: AC
  Administered 2015-05-01: 500 mg via INTRAVENOUS
  Filled 2015-05-01 (×2): qty 500

## 2015-05-01 MED ORDER — ACETAMINOPHEN 650 MG RE SUPP: 650.0000 mg | Freq: Four times a day (QID) | RECTAL | Status: DC | PRN

## 2015-05-01 MED ORDER — VANCOMYCIN HCL IN DEXTROSE 1-5 GM/200ML-% IV SOLN
1000.0000 mg | Freq: Once | INTRAVENOUS | Status: AC
  Administered 2015-05-01: 1000 mg via INTRAVENOUS
  Filled 2015-05-01: qty 200

## 2015-05-01 MED ORDER — LEVOTHYROXINE SODIUM 25 MCG PO TABS
137.0000 ug | ORAL_TABLET | Freq: Every day | ORAL | Status: DC
  Administered 2015-05-02 – 2015-05-04 (×3): 137 ug via ORAL
  Filled 2015-05-01 (×3): qty 1

## 2015-05-01 MED ORDER — PIPERACILLIN-TAZOBACTAM 3.375 G IVPB: 3.3750 g | Freq: Once | INTRAVENOUS | Status: DC

## 2015-05-01 MED ORDER — PRAMIPEXOLE DIHYDROCHLORIDE 0.25 MG PO TABS
0.2500 mg | ORAL_TABLET | Freq: Every day | ORAL | Status: DC
  Administered 2015-05-01 – 2015-05-03 (×3): 0.25 mg via ORAL
  Filled 2015-05-01 (×4): qty 1

## 2015-05-01 MED ORDER — SODIUM CHLORIDE 0.9 % IV BOLUS (SEPSIS)
2000.0000 mL | Freq: Once | INTRAVENOUS | Status: AC
  Administered 2015-05-01: 2000 mL via INTRAVENOUS

## 2015-05-01 MED ORDER — HEPARIN SODIUM (PORCINE) 5000 UNIT/ML IJ SOLN
5000.0000 [IU] | Freq: Three times a day (TID) | INTRAMUSCULAR | Status: DC
  Administered 2015-05-01 – 2015-05-04 (×7): 5000 [IU] via SUBCUTANEOUS
  Filled 2015-05-01 (×7): qty 1

## 2015-05-01 MED ORDER — ENSURE ENLIVE PO LIQD
237.0000 mL | Freq: Two times a day (BID) | ORAL | Status: DC
  Administered 2015-05-02 – 2015-05-03 (×3): 237 mL via ORAL

## 2015-05-01 MED ORDER — SODIUM CHLORIDE 0.9% FLUSH
3.0000 mL | Freq: Two times a day (BID) | INTRAVENOUS | Status: DC
  Administered 2015-05-02 – 2015-05-03 (×2): 3 mL via INTRAVENOUS

## 2015-05-01 MED ORDER — ACETAMINOPHEN 325 MG PO TABS
650.0000 mg | ORAL_TABLET | Freq: Four times a day (QID) | ORAL | Status: DC | PRN
  Administered 2015-05-03: 650 mg via ORAL
  Filled 2015-05-01: qty 2

## 2015-05-01 MED ORDER — SENNA 8.6 MG PO TABS
1.0000 | ORAL_TABLET | Freq: Two times a day (BID) | ORAL | Status: DC
  Administered 2015-05-02 – 2015-05-03 (×2): 8.6 mg via ORAL
  Filled 2015-05-01 (×4): qty 1

## 2015-05-01 MED ORDER — LEVALBUTEROL HCL 0.63 MG/3ML IN NEBU: 0.6300 mg | INHALATION_SOLUTION | Freq: Four times a day (QID) | RESPIRATORY_TRACT | Status: DC | PRN

## 2015-05-01 MED ORDER — VANCOMYCIN HCL IN DEXTROSE 1-5 GM/200ML-% IV SOLN
1000.0000 mg | INTRAVENOUS | Status: DC
  Administered 2015-05-02: 1000 mg via INTRAVENOUS
  Filled 2015-05-01: qty 200

## 2015-05-01 MED ORDER — SODIUM CHLORIDE 0.9 % IV SOLN
INTRAVENOUS | Status: DC
  Administered 2015-05-01: 22:00:00 via INTRAVENOUS

## 2015-05-01 MED ORDER — PROPAFENONE HCL 150 MG PO TABS
150.0000 mg | ORAL_TABLET | Freq: Every day | ORAL | Status: DC
  Administered 2015-05-02 – 2015-05-04 (×3): 150 mg via ORAL
  Filled 2015-05-01 (×5): qty 1

## 2015-05-01 NOTE — ED Notes (Signed)
Attempted reported 

## 2015-05-01 NOTE — ED Provider Notes (Addendum)
CSN: [id number]     Arrival date & time 05/01/15  1351 History   First MD Initiated Contact with Patient 05/01/15 1424     Chief Complaint  Patient presents with  . Loss of Consciousness   Level V caveat dementia history is obtained from records coding patient from Dr. Felipa Eth over the telephone and from old records  (Consider location/radiation/quality/duration/timing/severity/associated sxs/prior Treatment) Patient is a 82 y.o. male presenting with syncope.  Loss of Consciousness  Patient went to Dr. Carlyle Lipa office today for checkup. Upon arrival to the office he had a syncopal event and presence of Dr. Felipa Eth. He was unconscious for one or 2 minutes. Blood pressure was [id number] systolic. Patient denies pain anywhere. Per conversation with Dr. Felipa Eth patient has had a gradual decline over past several months. He is bedbound. Past Medical History  Diagnosis Date  . Orthostatic lightheadedness   . History of subdural hematoma     LEFT CHRONIC SUBDURAL HEMATOMA   S/P PARIETAL CRANIOTOMY WITH EVACUATION HEMATOMA  . Atrial fibrillation, persistent ([place])   . H/O cardiac radiofrequency ablation     A-FLUTTER  . Cardiac pacemaker last check 08-30-2013    05-05-2007  DD  MEDTRONIC GUIDANT INSIGNIA  . RBBB   . Benign essential tremor   . Diverticulosis of colon   . Tachy-brady syndrome ([place])   . SSS (sick sinus syndrome) ([place])   . Hydronephrosis, bilateral   . Renal insufficiency   . Recurrent prostate carcinoma ([place])     2005  S/P RADIOACTIVE SEED IMPLANTS/   BLADDER NECK MASS RESECTED 05/ 2015  SHOWED RECURRENT PROSTATE CANCER   Past Surgical History  Procedure Laterality Date  . Tonsillectomy  as child  . Subdural hematoma evacuation via craniotomy  12-16-2007  . Rotator cuff repair w/ distal clavicle excision Left 08-31-2010  . Pacemaker insertion  05-05-2007      Guidant Insignia [employer])  . Total knee arthroplasty  12/18/2010    Procedure: TOTAL KNEE ARTHROPLASTY;   Surgeon: Mauri Pole;  Location: [facility];  Service: Orthopedics;  Laterality: Left;  . Cataract extraction    . Cystoscopy w/ ureteral stent placement Bilateral 05/30/2013    Procedure: CYSTOSCOPY WITH RETROGRADE PYELOGRAM/ BILATERAL URETERAL STENT PLACEMENT, TRANSURETRAL RESECTION OF BLADDER TUMOR WITH GYRUS;  Surgeon: Alexis Frock, MD;  Location: [facility];  Service: Urology;  Laterality: Bilateral;  . Cardioversion N/A 08/04/2013    Procedure: CARDIOVERSION;  Surgeon: Sanda Klein, MD;  [facility];  Service: Cardiovascular;  Laterality: N/A;  . Knee arthroscopy Left 02-11-2007  . Cardioversion  multiple--  last one 08-04-2013    SINCE 2008  . Cardiac electrophysiology mapping and ablation  04-06-2007  dr Caryl Comes  . Cardiac catheterization  12-22-2006      non-obstructive CAD / RCA 20%/  ef 50%/  a-fib  . Radioactive prostate seed implants  12-21-2003  . Orif left distal clavical fx  08-31-2010  . Colonoscopy w/ polypectomy  12-01-2008  . Transthoracic echocardiogram  05-31-2013    MILD LVH/  EF 65%/  MODERATE LAE/  TRIVIAL MR  &  TR  . Cystoscopy/retrograde/ureteroscopy Right 09/16/2013    Procedure: CYSTOSCOPY/RETROGRADE/URETEROSCOPIC STENT EXTRACTION/  bil retrograde pyelogram with inseertion of bilateral stents;  Surgeon: Jorja Loa, MD;  Location: [facility];  Service: Urology;  Laterality: Right;  . Cystoscopy w/ ureteral stent placement Bilateral 10/10/2014    Procedure: CYSTOSCOPY, WITH STENT REPLACEMENT, RIGHT URETEROSCOPY ;  Surgeon: Franchot Gallo, MD;  Location: [facility];  Service: Urology;  Laterality: Bilateral;   Family History  Problem Relation Age of Onset  . Aneurysm Father   . Stroke Father    Social History  Substance Use Topics  . Smoking status: Never Smoker   . Smokeless tobacco: Never Used  . Alcohol Use: 12.6 oz/week    7 Glasses of wine, 7 Standard drinks or equivalent, 7 Shots of liquor per week     Comment: 2 glasses  wine day    Review of Systems  Unable to perform ROS: Dementia  Cardiovascular: Positive for syncope.  Musculoskeletal: Positive for gait problem.      Allergies  Review of patient's allergies indicates no known allergies.  Home Medications   Prior to Admission medications   Medication Sig Start Date End Date Taking? Authorizing Provider  donepezil (ARICEPT) 10 MG tablet Take 10 mg by mouth at bedtime.   Yes Historical Provider, MD  levothyroxine (SYNTHROID, LEVOTHROID) 137 MCG tablet Take 137 mcg by mouth daily before breakfast.   Yes Historical Provider, MD  mirabegron ER (MYRBETRIQ) 25 MG TB24 tablet Take 25 mg by mouth daily.   Yes Historical Provider, MD  Multiple Vitamin (MULTIVITAMIN WITH MINERALS) TABS tablet Take 1 tablet by mouth daily.   Yes Historical Provider, MD  pramipexole (MIRAPEX) 0.25 MG tablet Take 0.25 mg by mouth at bedtime. 01/27/15  Yes Historical Provider, MD  Probiotic Product (PROBIOTIC PO) Take 1 tablet by mouth daily.   Yes Historical Provider, MD  propafenone (RYTHMOL) 150 MG tablet Take 150 mg by mouth daily.   Yes Historical Provider, MD  propranolol ER (INDERAL LA) 60 MG 24 hr capsule TAKE ONE CAPSULE BY MOUTH EVERY DAY 03/27/15  Yes Deboraha Sprang, MD  tiZANidine (ZANAFLEX) 2 MG tablet Take 2 mg by mouth every 8 (eight) hours as needed for muscle spasms.   Yes Historical Provider, MD  donepezil (ARICEPT) 23 MG TABS tablet TAKE 1 TABLET (23 MG TOTAL) BY MOUTH AT BEDTIME. Patient not taking: Reported on 05/01/2015 03/07/15   Garvin Fila, MD  HYDROcodone-acetaminophen (NORCO/VICODIN) 5-325 MG tablet Take 1 tablet by mouth every 6 (six) hours as needed for moderate pain.    Historical Provider, MD  megestrol (MEGACE) 20 MG tablet Take 20 mg by mouth 2 (two) times daily.     Historical Provider, MD  propafenone (RYTHMOL) 225 MG tablet TAKE 1 TABLET (225 MG TOTAL) BY MOUTH 2 (TWO) TIMES DAILY. Patient not taking: Reported on 05/01/2015 02/06/15   Deboraha Sprang, MD  zolpidem (AMBIEN) 10 MG tablet Take 10 mg by mouth at bedtime as needed for sleep.  03/29/14   Historical Provider, MD   BP 122/53 mmHg  Pulse 49  Temp(Src) 97.7 F (36.5 C) (Oral)  Resp 15  SpO2 100% Physical Exam  Constitutional: No distress.  Chronically ill-appearing  HENT:  Head: Normocephalic and atraumatic.  Eyes: Conjunctivae are normal. Pupils are equal, round, and reactive to light.  Neck: Neck supple. No tracheal deviation present. No thyromegaly present.  Cardiovascular: Regular rhythm.   No murmur heard. Bradycardic  Pulmonary/Chest: Effort normal and breath sounds normal.  Abdominal: Soft. Bowel sounds are normal. He exhibits no distension. There is no tenderness.  Musculoskeletal: Normal range of motion. He exhibits no edema or tenderness.  Neurological: He is alert. Coordination normal.  Moves all extremities follows simple commands  Skin: Skin is warm and dry. No rash noted.  Psychiatric: He has a normal mood and affect.  Nursing note and vitals  reviewed.   ED Course  Procedures (including critical care time) Labs Review Labs Reviewed - No data to display  Imaging Review No results found. I have personally reviewed and evaluated these images and lab results as part of my medical decision-making.   EKG Interpretation   Date/Time:  Monday May 01 2015 13:56:23 EDT Ventricular Rate:  49 PR Interval:  203 QRS Duration: 150 QT Interval:  491 QTC Calculation: 443 R Axis:   -20 Text Interpretation:  Sinus bradycardia Right bundle branch block prior  tracing showed paced rhythm Confirmed by Winfred Leeds  MD, Secret Kristensen [phone]) on  05/01/2015 2:09:49 PM     Pacemaker was interrogated. There were no paced beats. He is set at a rate of 40. Results for orders placed or performed during the hospital encounter of [id number]  Basic metabolic panel  Result Value Ref Range   Sodium 137 135 - 145 mmol/L   Potassium 4.4 3.5 - 5.1 mmol/L   Chloride 108 101 - 111  mmol/L   CO2 18 (L) 22 - 32 mmol/L   Glucose, Bld 110 (H) 65 - 99 mg/dL   BUN 34 (H) 6 - 20 mg/dL   Creatinine, Ser 1.58 (H) 0.61 - 1.24 mg/dL   Calcium 9.5 8.9 - 10.3 mg/dL   GFR calc non Af Amer 39 (L) >60 mL/min   GFR calc Af Amer 46 (L) >60 mL/min   Anion gap 11 5 - 15  CBC with Differential/Platelet  Result Value Ref Range   WBC 22.3 (H) 4.0 - 10.5 K/uL   RBC 4.23 4.22 - 5.81 MIL/uL   Hemoglobin 13.0 13.0 - 17.0 g/dL   HCT 39.2 39.0 - 52.0 %   MCV 92.7 78.0 - 100.0 fL   MCH 30.7 26.0 - 34.0 pg   MCHC 33.2 30.0 - 36.0 g/dL   RDW 14.2 11.5 - 15.5 %   Platelets 219 150 - 400 K/uL   Neutrophils Relative % 81 %   Neutro Abs 8.5 (H) 1.7 - 7.7 K/uL   Lymphocytes Relative 13 %   Lymphs Abs 1.3 0.7 - 4.0 K/uL   Monocytes Relative 5 %   Monocytes Absolute 0.5 0.1 - 1.0 K/uL   Eosinophils Relative 2 %   Eosinophils Absolute 0.2 0.0 - 0.7 K/uL   Basophils Relative 0 %   Basophils Absolute 0.0 0.0 - 0.1 K/uL   No results found.  MDM  Patient is DO NOT RESUSCITATE CODE STATUS Final diagnoses:  None  Patient  septic with leukocytosis, syncope. Per Medtronic pacemaker [employer]. Patient's battery is low on his pacemaker. Dr. Caryl Comes has been weighing placing new pacemaker versus this one lasting given risk of surgery and patient's poor baseline state of health. I've consulted Dr.Abrol who will arrange for admission to telemetry. Dr.Abrol check further lab work. Dr. Felipa Eth feels that patient will likely need SNF after discharge as patient's wife is in poor state of health Intravenous fluids ordered. Renal insufficiency is chronic.Palliative care consult ordered Diagnoses #1 syncope #2 leukocytosis      Orlie Dakin, MD 05/01/15 [place], MD 05/01/15 1745

## 2015-05-01 NOTE — ED Notes (Addendum)
Pt to ER via [facility] from doctors office where patient experienced a syncopal episode. Pt was sitting when this occurred, BP recorded at that time said to be 100/60. EMS reports patient has pacemaker however HR in 40's, pacemaker not firing, pt reports feeling fatigued, denies shortness of breath or chest pain. VSS otherwise. Pt has foley in place, urine tea colored. Pt is alert and oriented x4.

## 2015-05-01 NOTE — Progress Notes (Signed)
  Pharmacy Antibiotic Note  Barry SPOSITO is a 82 y.o. male admitted on 05/01/2015 with sepsis.  Presents after LOC at PCP.  PMH includes AFib (no anticoag), Pharmacy has been consulted for Vancomycin and Zosyn dosing.  On Admit: Afebrile, HR 49, RR 15, WBC 22.3 SCr 1.58 (CrCl 40)  Plan: --Vancomycin 1500 mg IV x 1, then 1000 mg IV q24h --Zosyn 3.375g IV q8h (4 hour infusion). --Obtain vanc trough at steady state (goal 15-20) --Follow renal function, clinical course, and cultures     Temp (24hrs), Avg:97.7 F (36.5 C), Min:97.7 F (36.5 C), Max:97.7 F (36.5 C)   Recent Labs Lab 05/01/15 1619  WBC 22.3*  CREATININE 1.58*    CrCl cannot be calculated (Unknown ideal weight.).    No Known Allergies  Antimicrobials this admission: 4/17 Vanc >>  4/17 Zosyn >>   Dose adjustments this admission:   Microbiology results: 4/17 BCx:  4/17 UCx:     Thank you for allowing pharmacy to be a part of this patient's care.  Viann Fish 05/01/2015 6:14 PM

## 2015-05-01 NOTE — ED Notes (Signed)
Pt's pacemaker being interrogated at this time 

## 2015-05-01 NOTE — Consult Note (Signed)
Cardiologist:  [name]/[name] Reason for Consult:  Syncope Referring Physician:    [name] is an 82 y.o. male.  HPI:   Patient is a 82 year old male who is bedbound with history of persistent atrial fibrillation(off Xarelto now), radiofrequency ablation for atrial flutter, cardiac pacemaker 05/05/2007 for tachybradycardia syndrome, chronic renal insufficiency, prostate cancer, left chronic subdural hematoma status post partial craniotomy with evacuation. In May 2015 he had cystoscopy with resection of a bladder tumor and bilateral urethral stents he also has an indwelling Foley catheter.  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 He underwent DC CV July 2015. Nonobstructive CAD by catheter 2008. Apparently his Medtronic pacemaker reached ERI 10/13/2014.  Less than 6 months of battery life at that time.  Patient presents after having syncopal episode on the way to his primary care provider. He was in his wheelchair at the time.  He denies any presyncopal symptoms or warning. In the past she's gotten clammy. Patient has physical therapy approximately once per week during which they try to get him out of bed and sit him up in a chair. The longest he sits up is about 45 minutes.  Past Medical History  Diagnosis Date  . Orthostatic lightheadedness   . History of subdural hematoma     LEFT CHRONIC SUBDURAL HEMATOMA   S/P PARIETAL CRANIOTOMY WITH EVACUATION HEMATOMA  . Atrial fibrillation, persistent ([place])   . H/O cardiac radiofrequency ablation     A-FLUTTER  . Cardiac pacemaker last check 08-30-2013    05-05-2007  DD  MEDTRONIC GUIDANT INSIGNIA  . RBBB   . Benign essential tremor   . Diverticulosis of colon   . Tachy-brady syndrome ([place])   . SSS (sick sinus syndrome) ([place])   . Hydronephrosis, bilateral   . Renal insufficiency   . Recurrent prostate carcinoma ([place])     2005  S/P RADIOACTIVE SEED IMPLANTS/   BLADDER NECK MASS RESECTED 05/ 2015  SHOWED RECURRENT PROSTATE  CANCER    Past Surgical History  Procedure Laterality Date  . Tonsillectomy  as child  . Subdural hematoma evacuation via craniotomy  12-16-2007  . Rotator cuff repair w/ distal clavicle excision Left 08-31-2010  . Pacemaker insertion  05-05-2007      Guidant Insignia [employer])  . Total knee arthroplasty  12/18/2010    Procedure: TOTAL KNEE ARTHROPLASTY;  Surgeon: Mauri Pole;  Location: [facility];  Service: Orthopedics;  Laterality: Left;  . Cataract extraction    . Cystoscopy w/ ureteral stent placement Bilateral 05/30/2013    Procedure: CYSTOSCOPY WITH RETROGRADE PYELOGRAM/ BILATERAL URETERAL STENT PLACEMENT, TRANSURETRAL RESECTION OF BLADDER TUMOR WITH GYRUS;  Surgeon: Alexis Frock, MD;  Location: [facility];  Service: Urology;  Laterality: Bilateral;  . Cardioversion N/A 08/04/2013    Procedure: CARDIOVERSION;  Surgeon: Sanda Klein, MD;  [facility];  Service: Cardiovascular;  Laterality: N/A;  . Knee arthroscopy Left 02-11-2007  . Cardioversion  multiple--  last one 08-04-2013    SINCE 2008  . Cardiac electrophysiology mapping and ablation  04-06-2007  dr Caryl Comes  . Cardiac catheterization  12-22-2006      non-obstructive CAD / RCA 20%/  ef 50%/  a-fib  . Radioactive prostate seed implants  12-21-2003  . Orif left distal clavical fx  08-31-2010  . Colonoscopy w/ polypectomy  12-01-2008  . Transthoracic echocardiogram  05-31-2013    MILD LVH/  EF 65%/  MODERATE LAE/  TRIVIAL MR  &  TR  .  Cystoscopy/retrograde/ureteroscopy Right 09/16/2013    Procedure: CYSTOSCOPY/RETROGRADE/URETEROSCOPIC STENT EXTRACTION/  bil retrograde pyelogram with inseertion of bilateral stents;  Surgeon: Jorja Loa, MD;  Location[facility];  Service: Urology;  Laterality: Right;  . Cystoscopy w/ ureteral stent placement Bilateral 10/10/2014    Procedure: CYSTOSCOPY, WITH STENT REPLACEMENT, RIGHT URETEROSCOPY ;  Surgeon: Franchot Gallo, MD;  Location: [facility];  Service:  Urology;  Laterality: Bilateral;    Family History  Problem Relation Age of Onset  . Aneurysm Father   . Stroke Father     Social History:  reports that he has never smoked. He has never used smokeless tobacco. He reports that he drinks about 12.6 oz of alcohol per week. He reports that he does not use illicit drugs.  Allergies: No Known Allergies  Medications: Prior to Admission medications   Medication Sig Start Date End Date Taking? Authorizing Provider  donepezil (ARICEPT) 10 MG tablet Take 10 mg by mouth at bedtime.   Yes Historical Provider, MD  levothyroxine (SYNTHROID, LEVOTHROID) 137 MCG tablet Take 137 mcg by mouth daily before breakfast.   Yes Historical Provider, MD  mirabegron ER (MYRBETRIQ) 25 MG TB24 tablet Take 25 mg by mouth daily.   Yes Historical Provider, MD  Multiple Vitamin (MULTIVITAMIN WITH MINERALS) TABS tablet Take 1 tablet by mouth daily.   Yes Historical Provider, MD  pramipexole (MIRAPEX) 0.25 MG tablet Take 0.25 mg by mouth at bedtime. 01/27/15  Yes Historical Provider, MD  Probiotic Product (PROBIOTIC PO) Take 1 tablet by mouth daily.   Yes Historical Provider, MD  propafenone (RYTHMOL) 150 MG tablet Take 150 mg by mouth daily.   Yes Historical Provider, MD  propranolol ER (INDERAL LA) 60 MG 24 hr capsule TAKE ONE CAPSULE BY MOUTH EVERY DAY 03/27/15  Yes Deboraha Sprang, MD  tiZANidine (ZANAFLEX) 2 MG tablet Take 2 mg by mouth every 8 (eight) hours as needed for muscle spasms.   Yes Historical Provider, MD  donepezil (ARICEPT) 23 MG TABS tablet TAKE 1 TABLET (23 MG TOTAL) BY MOUTH AT BEDTIME. Patient not taking: Reported on 05/01/2015 03/07/15   Garvin Fila, MD  HYDROcodone-acetaminophen (NORCO/VICODIN) 5-325 MG tablet Take 1 tablet by mouth every 6 (six) hours as needed for moderate pain.    Historical Provider, MD  megestrol (MEGACE) 20 MG tablet Take 20 mg by mouth 2 (two) times daily.     Historical Provider, MD  propafenone (RYTHMOL) 225 MG tablet TAKE 1  TABLET (225 MG TOTAL) BY MOUTH 2 (TWO) TIMES DAILY. Patient not taking: Reported on 05/01/2015 02/06/15   Deboraha Sprang, MD  zolpidem (AMBIEN) 10 MG tablet Take 10 mg by mouth at bedtime as needed for sleep.  03/29/14   Historical Provider, MD     Results for orders placed or performed during the hospital encounter of 05/01/15 (from the past 48 hour(s))  Basic metabolic panel     Status: Abnormal   Collection Time: 05/01/15  4:19 PM  Result Value Ref Range   Sodium 137 135 - 145 mmol/L   Potassium 4.4 3.5 - 5.1 mmol/L   Chloride 108 101 - 111 mmol/L   CO2 18 (L) 22 - 32 mmol/L   Glucose, Bld 110 (H) 65 - 99 mg/dL   BUN 34 (H) 6 - 20 mg/dL   Creatinine, Ser 1.58 (H) 0.61 - 1.24 mg/dL   Calcium 9.5 8.9 - 10.3 mg/dL   GFR calc non Af Amer 39 (L) >60 mL/min  GFR calc Af Amer 46 (L) >60 mL/min    Comment: (NOTE) The eGFR has been calculated using the CKD EPI equation. This calculation has not been validated in all clinical situations. eGFR's persistently <60 mL/min signify possible Chronic Kidney Disease.    Anion gap 11 5 - 15  CBC with Differential/Platelet     Status: Abnormal   Collection Time: 05/01/15  4:19 PM  Result Value Ref Range   WBC 22.3 (H) 4.0 - 10.5 K/uL   RBC 4.23 4.22 - 5.81 MIL/uL   Hemoglobin 13.0 13.0 - 17.0 g/dL   HCT 39.2 39.0 - 52.0 %   MCV 92.7 78.0 - 100.0 fL   MCH 30.7 26.0 - 34.0 pg   MCHC 33.2 30.0 - 36.0 g/dL   RDW 14.2 11.5 - 15.5 %   Platelets 219 150 - 400 K/uL   Neutrophils Relative % 81 %   Neutro Abs 8.5 (H) 1.7 - 7.7 K/uL   Lymphocytes Relative 13 %   Lymphs Abs 1.3 0.7 - 4.0 K/uL   Monocytes Relative 5 %   Monocytes Absolute 0.5 0.1 - 1.0 K/uL   Eosinophils Relative 2 %   Eosinophils Absolute 0.2 0.0 - 0.7 K/uL   Basophils Relative 0 %   Basophils Absolute 0.0 0.0 - 0.1 K/uL  Urinalysis, Routine w reflex microscopic (not at [facility])     Status: Abnormal   Collection Time: 05/01/15  5:15 PM  Result Value Ref Range   Color, Urine RED (A)  YELLOW    Comment: BIOCHEMICALS MAY BE AFFECTED BY COLOR   APPearance TURBID (A) CLEAR   Specific Gravity, Urine 1.017 1.005 - 1.030   pH 5.5 5.0 - 8.0   Glucose, UA NEGATIVE NEGATIVE mg/dL   Hgb urine dipstick LARGE (A) NEGATIVE   Bilirubin Urine NEGATIVE NEGATIVE   Ketones, ur 15 (A) NEGATIVE mg/dL   Protein, ur >300 (A) NEGATIVE mg/dL   Nitrite NEGATIVE NEGATIVE   Leukocytes, UA LARGE (A) NEGATIVE  Urine microscopic-add on     Status: Abnormal   Collection Time: 05/01/15  5:15 PM  Result Value Ref Range   Squamous Epithelial / LPF NONE SEEN NONE SEEN   WBC, UA TOO NUMEROUS TO COUNT 0 - 5 WBC/hpf   RBC / HPF TOO NUMEROUS TO COUNT 0 - 5 RBC/hpf   Bacteria, UA MANY (A) NONE SEEN    Dg Chest Port 1 View  05/01/2015  CLINICAL DATA:  Syncopal episode and weakness EXAM: PORTABLE CHEST 1 VIEW COMPARISON:  07/21/2014 FINDINGS: Cardiac shadow is within normal limits. A pacing device is again seen and stable. The lungs are clear. No acute bony abnormality is noted. Calcified granuloma is again noted in the left lung base medially. IMPRESSION: No acute abnormality noted. Electronically Signed   By: Inez Catalina M.D.   On: 05/01/2015 18:09    Review of Systems  Constitutional: Negative for fever, chills and diaphoresis.  HENT: Negative for congestion and sore throat.   Respiratory: Negative for shortness of breath.   Cardiovascular: Negative for chest pain, palpitations, orthopnea and leg swelling.  Gastrointestinal: Negative for nausea, vomiting, abdominal pain and blood in stool.  Neurological: Positive for weakness. Negative for dizziness.  All other systems reviewed and are negative.  Blood pressure 122/62, pulse 47, temperature 97.7 F (36.5 C), temperature source Oral, resp. rate 15, SpO2 100 %. Physical Exam Well nourished, well developed, in no acute distress HEENT: Pupils are equal round react to light accommodation extraocular movements are  intact.  Neck: no JVDNo cervical  lymphadenopathy. Cardiac: Regular rhythm rate slow without murmurs rubs or gallops. Lungs:  clear to auscultation bilaterally, no wheezing, rhonchi or rales Ext: no lower extremity edema.  2+ radial and dorsalis pedis pulses. Skin: warm and dry Neuro:  Grossly normal    Assessment/Plan: Principal Problem:   Sepsis ([place]) Active Problems:   ATRIAL FIBRILLATION-paroxysmal   Pacemaker-dual-chamber-[employer]   Acute renal failure (H[facility])   Falls   Weakness   Anemia, iron deficiency   Syncope   82 year old male who is bedbound with history of persistent atrial fibrillation(off Xarelto now), radiofrequency ablation for atrial flutter, cardiac pacemaker 05/05/2007 for tachybradycardia syndrome, chronic renal insufficiency, prostate cancer, left chronic subdural hematoma status post partial craniotomy with evacuation. In May 2015 he had cystoscopy with resection of a bladder tumor and bilateral urethral stents he also has an indwelling Foley catheter.  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 He underwent DC CV July 2015. Nonobstructive CAD by catheter 2008. Apparently his Medtronic pacemaker reached ERI 10/13/2014.  Less than 6 months of battery life at that time.   He presented after having a syncopal episode.    Pacemaker interrogation showed he is in VVI mode set at 40.  No recorded events which is probably because he is at [facility].  He is only V-sensed now.  It is possible he had a syncopal episode secondary to prolonged period of sitting up since he spends 99% of his time in bed laying down, or related to bradycardia.  We will have EP see him tomorrow.  Continue to monitor on telemetry.  The patient cardiac status appears stable at the time being.  He is not having CP or SOB.  Overall prognosis should be commented on by his PCP.     Tarri Fuller, [place] 05/01/2015, 7:07 PM

## 2015-05-01 NOTE — ED Notes (Signed)
Charge Nurse made aware of pacemaker needing to be interrogated by [employer] [place]

## 2015-05-01 NOTE — H&P (Addendum)
[facility] History and Physical  [name] [id number] DOB: 1931/07/23 DOA: 05/01/2015  Referring physician:   PCP: [name], MD   Chief Complaint: Syncope   HPI:  82 year old male with a history of prostrate cancer, atrial fibrillation, off anticoagulation, history of atrial flutter, cardiac pacemaker placed in 2009, history of subdural hematoma, status post craniotomy, chronic urinary retention with chronic indwelling Foley with multiple urinary tract infections, chronic kidney disease stage III due to bilateral hydronephrosis, presents to the ER today due to loss of consciousness at Dr. Carlyle Lipa office. Patient upon arrival to the office had a syncopal episode for 2 minutes, was found to have a systolic blood pressure of 100. Patient denies feeling ill and had a routine follow up with Dr Felipa Eth today. Patient has had a gradual decline over the past several months. Patient is followed by Antony Contras, MD who feels that the patient's neurologic decline is multifactorial,combination of damage from prior subdural hemorrhage, cerebrovascular disease, mild dementia and underlying peripheral neuropathy and even possibly paraneoplastic related to prostate cancer. Long-standing history of benign essential tremor which appears stable. Patient found to have A. fib with bradycardia in the ER. Patient's pacemaker was interrogated, battery life appears to be less than 6 months. Patient is followed by Dr. Caryl Comes. He is unaware of his prognosis from a cardiac standpoint. From a neurologic standpoint the patient has been recommended to be referred to hospice if he continues to decline. Workup in the ER revealed a white count of 22K. Urinalysis with white blood cells too numerous to count, chest x-ray negative. The patient's daughters also state that the patient has developed sacral decubitus ulcers and has a rash under both armpits due to skin maceration.      Review of Systems:  negative for the following  Constitutional: Denies fever, chills, diaphoresis, appetite change and fatigue.  HEENT: Denies photophobia, eye pain, redness, hearing loss, ear pain, congestion, sore throat, rhinorrhea, sneezing, mouth sores, trouble swallowing, neck pain, neck stiffness and tinnitus.  Respiratory: Denies SOB, DOE, cough, chest tightness, and wheezing.  Cardiovascular: Denies chest pain, palpitations and leg swelling.  Gastrointestinal: Denies nausea, vomiting, abdominal pain, diarrhea, constipation, blood in stool and abdominal distention.  Genitourinary: Denies dysuria, urgency, frequency, hematuria, flank pain and difficulty urinating.  Musculoskeletal: Denies myalgias, back pain, joint swelling, arthralgias and gait problem.  Skin: Denies pallor, rash and wound.  Neurological: Positive for dizziness,   syncope, weakness, light-headedness, numbness and headaches.  Hematological: Denies adenopathy. Easy bruising, personal or family bleeding history  Psychiatric/Behavioral: Denies suicidal ideation, mood changes, confusion, nervousness, sleep disturbance and agitation       Past Medical History  Diagnosis Date  . Orthostatic lightheadedness   . History of subdural hematoma     LEFT CHRONIC SUBDURAL HEMATOMA   S/P PARIETAL CRANIOTOMY WITH EVACUATION HEMATOMA  . Atrial fibrillation, persistent ([place])   . H/O cardiac radiofrequency ablation     A-FLUTTER  . Cardiac pacemaker last check 08-30-2013    05-05-2007  DD  MED[employer] GUIDANT INSIGNIA  . RBBB   . Benign essential tremor   . Diverticulosis of colon   . Tachy-brady syndrome ([place])   . SSS (sick sinus syndrome) ([place])   . Hydronephrosis, bilateral   . Renal insufficiency   . Recurrent prostate carcinoma ([place])     2005  S/P RADIOACTIVE SEED IMPLANTS/   BLADDER NECK MASS RESECTED 05/ 2015  SHOWED RECURRENT PROSTATE CANCER     Past Surgical History  Procedure Laterality Date  .  Tonsillectomy  as child  . Subdural  hematoma evacuation via craniotomy  12-16-2007  . Rotator cuff repair w/ distal clavicle excision Left 08-31-2010  . Pacemaker insertion  05-05-2007      Guidant Insignia [employer])  . Total knee arthroplasty  12/18/2010    Procedure: TOTAL KNEE ARTHROPLASTY;  Surgeon: Mauri Pole;  Location: [facility];  Service: Orthopedics;  Laterality: Left;  . Cataract extraction    . Cystoscopy w/ ureteral stent placement Bilateral 05/30/2013    Procedure: CYSTOSCOPY WITH RETROGRADE PYELOGRAM/ BILATERAL URETERAL STENT PLACEMENT, TRANSURETRAL RESECTION OF BLADDER TUMOR WITH GYRUS;  Surgeon: Alexis Frock, MD;  Location: [facility];  Service: Urology;  Laterality: Bilateral;  . Cardioversion N/A 08/04/2013    Procedure: CARDIOVERSION;  Surgeon: Sanda Klein, MD;  Location: [facility];  Service: Cardiovascular;  Laterality: N/A;  . Knee arthroscopy Left 02-11-2007  . Cardioversion  multiple--  last one 08-04-2013    SINCE 2008  . Cardiac electrophysiology mapping and ablation  04-06-2007  dr Caryl Comes  . Cardiac catheterization  12-22-2006      non-obstructive CAD / RCA 20%/  ef 50%/  a-fib  . Radioactive prostate seed implants  12-21-2003  . Orif left distal clavical fx  08-31-2010  . Colonoscopy w/ polypectomy  12-01-2008  . Transthoracic echocardiogram  05-31-2013    MILD LVH/  EF 65%/  MODERATE LAE/  TRIVIAL MR  &  TR  . Cystoscopy/retrograde/ureteroscopy Right 09/16/2013    Procedure: CYSTOSCOPY/RETROGRADE/URETEROSCOPIC STENT EXTRACTION/  bil retrograde pyelogram with inseertion of bilateral stents;  Surgeon: Jorja Loa, MD;  Location: [facility];  Service: Urology;  Laterality: Right;  . Cystoscopy w/ ureteral stent placement Bilateral 10/10/2014    Procedure: CYSTOSCOPY, WITH STENT REPLACEMENT, RIGHT URETEROSCOPY ;  Surgeon: Franchot Gallo, MD;  Location: [facility];  Service: Urology;  Laterality: Bilateral;      Social History:  reports that he has never smoked. He has never  used smokeless tobacco. He reports that he drinks about 12.6 oz of alcohol per week. He reports that he does not use illicit drugs.    No Known Allergies  Family History  Problem Relation Age of Onset  . Aneurysm Father   . Stroke Father          Prior to Admission medications   Medication Sig Start Date End Date Taking? Authorizing Provider  donepezil (ARICEPT) 10 MG tablet Take 10 mg by mouth at bedtime.   Yes Historical Provider, MD  levothyroxine (SYNTHROID, LEVOTHROID) 137 MCG tablet Take 137 mcg by mouth daily before breakfast.   Yes Historical Provider, MD  mirabegron ER (MYRBETRIQ) 25 MG TB24 tablet Take 25 mg by mouth daily.   Yes Historical Provider, MD  Multiple Vitamin (MULTIVITAMIN WITH MINERALS) TABS tablet Take 1 tablet by mouth daily.   Yes Historical Provider, MD  pramipexole (MIRAPEX) 0.25 MG tablet Take 0.25 mg by mouth at bedtime. 01/27/15  Yes Historical Provider, MD  Probiotic Product (PROBIOTIC PO) Take 1 tablet by mouth daily.   Yes Historical Provider, MD  propafenone (RYTHMOL) 150 MG tablet Take 150 mg by mouth daily.   Yes Historical Provider, MD  propranolol ER (INDERAL LA) 60 MG 24 hr capsule TAKE ONE CAPSULE BY MOUTH EVERY DAY 03/27/15  Yes Deboraha Sprang, MD  tiZANidine (ZANAFLEX) 2 MG tablet Take 2 mg by mouth every 8 (eight) hours as needed for muscle spasms.   Yes Historical Provider, MD  donepezil (ARICEPT) 23 MG TABS tablet TAKE 1  TABLET (23 MG TOTAL) BY MOUTH AT BEDTIME. Patient not taking: Reported on 05/01/2015 03/07/15   Garvin Fila, MD  HYDROcodone-acetaminophen (NORCO/VICODIN) 5-325 MG tablet Take 1 tablet by mouth every 6 (six) hours as needed for moderate pain.    Historical Provider, MD  megestrol (MEGACE) 20 MG tablet Take 20 mg by mouth 2 (two) times daily.     Historical Provider, MD  propafenone (RYTHMOL) 225 MG tablet TAKE 1 TABLET (225 MG TOTAL) BY MOUTH 2 (TWO) TIMES DAILY. Patient not taking: Reported on 05/01/2015 02/06/15   Deboraha Sprang, MD  zolpidem (AMBIEN) 10 MG tablet Take 10 mg by mouth at bedtime as needed for sleep.  03/29/14   Historical Provider, MD     Physical Exam: Filed Vitals:   05/01/15 1615 05/01/15 1630 05/01/15 1645 05/01/15 1700  BP: 131/62 123/63 99/66 100/83  Pulse: 49 49 48 48  Temp:      TempSrc:      Resp: 16 14 19 14   SpO2: 100% 100% 100% 100%     Constitutional: Chronically ill-appearing   in no acute distress and cooperative with exam. Alert and oriented x3.  Head: Normocephalic and atraumatic  Ear: TM normal bilaterally  Mouth: no erythema or exudates, dry oral mucous membranes Eyes: PERRL, EOMI, conjunctivae normal, No scleral icterus.  Neck: Supple, Trachea midline normal ROM, No JVD, mass, thyromegaly, or carotid bruit present.  Cardiovascular: RRR, S1 normal, S2 normal, no MRG, pulses symmetric and intact bilaterally  Pulmonary/Chest: CTAB, no wheezes, rales, or rhonchi  Abdominal: Soft. Non-tender, non-distended, bowel sounds are normal, no masses, organomegaly, or guarding present.  GU: no CVA tenderness Musculoskeletal: No joint deformities, erythema, or stiffness, ROM full and no nontender Ext: no edema and no cyanosis, pulses palpable bilaterally (DP and PT)  Hematology: no cervical, inginal, or axillary adenopathy.  Neurological: Somewhat confused, Strenght is normal and symmetric bilaterally, cranial nerve II-XII are grossly intact, no focal motor deficit, sensory intact to light touch bilaterally.  Skin: Skin maceration under both armpits. Warm, dry and intact. No rash, cyanosis, or clubbing.  Psychiatric: Normal mood and affect. speech and behavior is normal. Judgment and thought content normal. Cognition and memory are normal.      Data Review   Micro Results No results found for this or any previous visit (from the past 240 hour(s)).  Radiology Reports No results found.   CBC  Recent Labs Lab 05/01/15 1619  WBC 22.3*  HGB 13.0  HCT 39.2  PLT 219   MCV 92.7  MCH 30.7  MCHC 33.2  RDW 14.2  LYMPHSABS 1.3  MONOABS 0.5  EOSABS 0.2  BASOSABS 0.0    Chemistries   Recent Labs Lab 05/01/15 1619  NA 137  K 4.4  CL 108  CO2 18*  GLUCOSE 110*  BUN 34*  CREATININE 1.58*  CALCIUM 9.5   ------------------------------------------------------------------------------------------------------------------ CrCl cannot be calculated (Unknown ideal weight.). ------------------------------------------------------------------------------------------------------------------ No results for input(s): HGBA1C in the last 72 hours. ------------------------------------------------------------------------------------------------------------------ No results for input(s): CHOL, HDL, LDLCALC, TRIG, CHOLHDL, LDLDIRECT in the last 72 hours. ------------------------------------------------------------------------------------------------------------------ No results for input(s): TSH, T4TOTAL, T3FREE, THYROIDAB in the last 72 hours.  Invalid input(s): FREET3 ------------------------------------------------------------------------------------------------------------------ No results for input(s): VITAMINB12, FOLATE, FERRITIN, TIBC, IRON, RETICCTPCT in the last 72 hours.  Coagulation profile No results for input(s): INR, PROTIME in the last 168 hours.  No results for input(s): DDIMER in the last 72 hours.  Cardiac Enzymes No results for input(s): CKMB, TROPONINI, MYOGLOBIN in the  last 168 hours.  Invalid input(s): CK ------------------------------------------------------------------------------------------------------------------ Invalid input(s): POCBNP   CBG: No results for input(s): GLUCAP in the last 168 hours.     EKG: Independently reviewed.   Assessment/Plan Principal Problem:   Sepsis (HCC)present on admission Patient could potentially have multiple sources of infection due to an chronic indwelling Foley, skin maceration, sacral  decubitus ulcer Initiated on broad-spectrum antibiotics, sepsis protocol initiated Blood cultures 2, lactic acid, ProcalAmine stone  obtain wound care consult,     ATRIAL FIBRILLATION-paroxysmal-off anticoagulation due to multiple falls, rate controlled on rythmol, cardiology has been consulted for further recommendations regarding pacemaker, anticipate that the patient may not be a candidate for replacement of his pacemaker given multiple other comorbidities  Syncope:due to bradycardia vs due to sepsis  Unable to check orthostatics, start IVF , and monitor for bradycardia  May need to hold rythmol?    Acute renal failure (HCC)-chronic kidney disease, baseline creatinine around 1.5, creatinine around 1.58, continue chronic indwelling Foley, replaced once a month by a home health nurse, has a history of prostrate cancer but no hematuria   History of deficiency anemia-likely dehydrated due to marked improvement in his hemoglobin today, expect hemoglobin did go down after receiving IV fluids    Falls-patient is now bedbound for the last 6 months, slowly declining     [place] PT OT consultation once this patient is stable for discharge, PCP recommended SNF. Patient may need palliative care consultation for goals of care, if he is hospice eligible. Able to go home with hospice.        Code Status Orders    DO NOT RESUSCITATE    Start     Ordered    Family Communication: bedside, Discussed with 2 daughters Disposition Plan: admit   Total time spent 55 minutes.Greater than 50% of this time was spent in counseling, explanation of diagnosis, planning of further management, and coordination of care  [facility] Pager [phone]  If 7PM-7AM, please contact night-coverage [email or web address] Password [facility] 05/01/2015, 6:12 PM

## 2015-05-01 NOTE — ED Notes (Signed)
Pts family made aware of bed assignment 

## 2015-05-02 DIAGNOSIS — R531 Weakness: Secondary | ICD-10-CM

## 2015-05-02 DIAGNOSIS — N189 Chronic kidney disease, unspecified: Secondary | ICD-10-CM

## 2015-05-02 DIAGNOSIS — D631 Anemia in chronic kidney disease: Secondary | ICD-10-CM

## 2015-05-02 DIAGNOSIS — L8990 Pressure ulcer of unspecified site, unspecified stage: Secondary | ICD-10-CM | POA: Diagnosis present

## 2015-05-02 DIAGNOSIS — C61 Malignant neoplasm of prostate: Secondary | ICD-10-CM

## 2015-05-02 DIAGNOSIS — N183 Chronic kidney disease, stage 3 unspecified: Secondary | ICD-10-CM | POA: Diagnosis present

## 2015-05-02 DIAGNOSIS — N390 Urinary tract infection, site not specified: Secondary | ICD-10-CM | POA: Diagnosis present

## 2015-05-02 DIAGNOSIS — W19XXXA Unspecified fall, initial encounter: Secondary | ICD-10-CM

## 2015-05-02 LAB — COMPREHENSIVE METABOLIC PANEL
ALBUMIN: 2.7 g/dL — AB (ref 3.5–5.0)
ALT: 15 U/L — ABNORMAL LOW (ref 17–63)
ANION GAP: 11 (ref 5–15)
AST: 22 U/L (ref 15–41)
Alkaline Phosphatase: 55 U/L (ref 38–126)
BUN: 31 mg/dL — AB (ref 6–20)
CHLORIDE: 115 mmol/L — AB (ref 101–111)
CO2: 14 mmol/L — ABNORMAL LOW (ref 22–32)
Calcium: 8.6 mg/dL — ABNORMAL LOW (ref 8.9–10.3)
Creatinine, Ser: 1.54 mg/dL — ABNORMAL HIGH (ref 0.61–1.24)
GFR calc Af Amer: 47 mL/min — ABNORMAL LOW (ref >60)
GFR calc non Af Amer: 41 mL/min — ABNORMAL LOW (ref >60)
GLUCOSE: 85 mg/dL (ref 65–99)
POTASSIUM: 4.3 mmol/L (ref 3.5–5.1)
Sodium: 140 mmol/L (ref 135–145)
Total Bilirubin: 0.7 mg/dL (ref 0.3–1.2)
Total Protein: 6.1 g/dL — ABNORMAL LOW (ref 6.5–8.1)

## 2015-05-02 LAB — CBC
HEMATOCRIT: 27.4 % — AB (ref 39.0–52.0)
HEMOGLOBIN: 8.6 g/dL — AB (ref 13.0–17.0)
MCH: 27.3 pg (ref 26.0–34.0)
MCHC: 31.4 g/dL (ref 30.0–36.0)
MCV: 87 fL (ref 78.0–100.0)
Platelets: 255 10*3/uL (ref 150–400)
RBC: 3.15 MIL/uL — ABNORMAL LOW (ref 4.22–5.81)
RDW: 16.5 % — AB (ref 11.5–15.5)
WBC: 9.1 10*3/uL (ref 4.0–10.5)

## 2015-05-02 MED ORDER — PROBIOTIC PO CAPS: ORAL_CAPSULE | Freq: Every day | ORAL | Status: DC

## 2015-05-02 MED ORDER — TIZANIDINE HCL 2 MG PO TABS
2.0000 mg | ORAL_TABLET | Freq: Three times a day (TID) | ORAL | Status: DC | PRN
  Administered 2015-05-03: 2 mg via ORAL
  Filled 2015-05-02 (×3): qty 1

## 2015-05-02 MED ORDER — ZOLPIDEM TARTRATE 5 MG PO TABS: 5.0000 mg | ORAL_TABLET | Freq: Every evening | ORAL | Status: DC | PRN

## 2015-05-02 MED ORDER — GERHARDT'S BUTT CREAM
TOPICAL_CREAM | Freq: Every day | CUTANEOUS | Status: DC
  Administered 2015-05-02: 1 via TOPICAL
  Administered 2015-05-03: 10:00:00 via TOPICAL
  Administered 2015-05-03: 1 via TOPICAL
  Administered 2015-05-04: 11:00:00 via TOPICAL
  Filled 2015-05-02: qty 1

## 2015-05-02 MED ORDER — DONEPEZIL HCL 10 MG PO TABS
10.0000 mg | ORAL_TABLET | Freq: Every day | ORAL | Status: DC
  Administered 2015-05-02 – 2015-05-03 (×2): 10 mg via ORAL
  Filled 2015-05-02 (×2): qty 1

## 2015-05-02 MED ORDER — RISAQUAD PO CAPS
1.0000 | ORAL_CAPSULE | Freq: Every day | ORAL | Status: DC
  Administered 2015-05-03 – 2015-05-04 (×2): 1 via ORAL
  Filled 2015-05-02 (×2): qty 1

## 2015-05-02 NOTE — Progress Notes (Signed)
SUBJECTIVE: The patient is doing well today.  At this time, he denies chest pain, shortness of breath, or any new concerns. He reports no memory of episode of syncope while at PCP's office.  He was apparently sitting at the time. He has not had any dizziness or pre-syncope at home since device reprogrammed to VVI 40.   CURRENT MEDICATIONS: . feeding supplement (ENSURE ENLIVE)  237 mL Oral BID BM  . heparin  5,000 Units Subcutaneous 3 times per day  . levothyroxine  137 mcg Oral QAC breakfast  . mirabegron ER  25 mg Oral Daily  . piperacillin-tazobactam (ZOSYN)  IV  3.375 g Intravenous Once  . piperacillin-tazobactam (ZOSYN)  IV  3.375 g Intravenous Q8H  . pramipexole  0.25 mg Oral QHS  . propafenone  150 mg Oral Daily  . senna  1 tablet Oral BID  . sodium chloride flush  3 mL Intravenous Q12H  . vancomycin  1,000 mg Intravenous Q24H      OBJECTIVE: Physical Exam: Filed Vitals:   05/01/15 1845 05/01/15 1900 05/01/15 2000 05/02/15 0426  BP: 122/62 140/62  150/69  Pulse: 47 51  54  Temp:    97.5 F (36.4 C)  TempSrc:    Oral  Resp: 15 15  18   Height:   6' (1.829 m)   Weight:   156 lb 4.9 oz (70.9 kg) 160 lb 7.9 oz (72.8 kg)  SpO2: 100% 100%  100%    Intake/Output Summary (Last 24 hours) at 05/02/15 1020 Last data filed at 05/02/15 [id number]  Gross per 24 hour  Intake   2890 ml  Output   1600 ml  Net   1290 ml    Telemetry reveals sinus bradycardia  GEN- The patient is elderly appearing, alert and oriented x 3 today.   Head- normocephalic, atraumatic Eyes-  Sclera clear, conjunctiva pink Ears- hearing intact Oropharynx- clear Neck- supple  Lungs- Clear to ausculation bilaterally, normal work of breathing Heart- Regular rate and rhythm  GI- soft, NT, ND, + BS Extremities- no clubbing, cyanosis, or edema Skin- no rash or lesion Psych- euthymic mood, full affect Neuro- strength and sensation are intact  LABS: Basic Metabolic Panel:  Recent Labs  05/01/15 1619  05/01/15 1841 05/02/15 0418  NA 137  --  140  K 4.4  --  4.3  CL 108  --  115*  CO2 18*  --  14*  GLUCOSE 110*  --  85  BUN 34*  --  31*  CREATININE 1.58* 1.68* 1.54*  CALCIUM 9.5  --  8.6*  MG  --  1.9  --    Liver Function Tests:  Recent Labs  05/02/15 0418  AST 22  ALT 15*  ALKPHOS 55  BILITOT 0.7  PROT 6.1*  ALBUMIN 2.7*  CBC:  Recent Labs  05/01/15 1619 05/02/15 0418  WBC 22.3* 9.1  NEUTROABS 8.5*  --   HGB 13.0 8.6*  HCT 39.2 27.4*  MCV 92.7 87.0  PLT 219 255   Thyroid Function Tests:  Recent Labs  05/01/15 1840  TSH 0.504    RADIOLOGY: Dg Chest Port 1 View 05/01/2015  CLINICAL DATA:  Syncopal episode and weakness EXAM: PORTABLE CHEST 1 VIEW COMPARISON:  07/21/2014 FINDINGS: Cardiac shadow is within normal limits. A pacing device is again seen and stable. The lungs are clear. No acute bony abnormality is noted. Calcified granuloma is again noted in the left lung base medially. IMPRESSION: No acute abnormality noted. Electronically Signed  By: Inez Catalina M.D.   On: 05/01/2015 18:09    ASSESSMENT AND PLAN:  Principal Problem:   Sepsis ([place]) Active Problems:   ATRIAL FIBRILLATION-paroxysmal   Pacemaker-dual-chamber-[employer]   Acute renal failure (HCC)   Falls   Weakness   Anemia, iron deficiency   Syncope   Pressure ulcer   1.  Syncope Unclear etiology I am less concerned about bradyarrythmia with history of device programmed VVI 40 and no pacing.  However, this cannot be completely excluded and pacemaker generator change isn't unreasonable.  With ongoing sepsis workup, Kamani Magnussen defer implant for today and await labs tomorrow.  If WBC normal and patient and wife agree, Kimble Delaurentis plan for PPM gen change tomorrow with Dr Caryl Comes who knows him well.  2.  Tachy/brady syndrome As above Continue Rhythmol Off Xarelto for now  3.  ?Sepsis Blood cultures pending No fevers, chills WBC normal now  Chanetta Marshall, NP 05/02/2015 10:25 AM  I have  seen and examined this patient with Chanetta Marshall.  Agree with above, note added to reflect my findings.  On exam, regular rhythm, no murmurs, lungs clear.  Patient has dual chamber pacemaker that is now at [facility].  Device programmed to VVI 40 currently.  Presented to the hospital due to syncope.  Syncope while sitting in wheelchair as bedridden and cannot stand.  Likely due to exertion of moving into PCP office.  Discussed the generator change.  Patient had elevated WBC on admission into the 20s with blood cultures pending.  WBC normalized today.  Cylee Dattilo continue to monitor with possible generator change tomorrow with Dr. Caryl Comes.  Discussed risks and benefits of the procedure.  Risks include but are not limited to bleeding, infection.  Patient understands these risks.  The patient would like to discuss this further with his wife.    Zadaya Cuadra M. Josiah Wojtaszek MD 05/02/2015 10:31 AM

## 2015-05-02 NOTE — Care Management Note (Addendum)
Case Management Note  Patient Details  Name: [name] MRN: [id number] Date of Birth: February 21, 1931  Subjective/Objective:   Pt admitted for Weakness, Bradycardia. PPM interrogated. Pt is from home and is active with [facility] for HHRN/ PT services.                  Action/Plan: Palliative Care is consulting and CM will continue to monitor for the plan of care.    Expected Discharge Date:                  Expected Discharge Plan:  Home w Hospice Care  In-House Referral:  Clinical Social Work  Discharge planning Services  CM Consult  Post Acute Care Choice:  Home Health, Resumption of Svcs/PTA Provider Choice offered to:     DME Arranged:    DME Agency:     HH Arranged:  RN, PT [place] Agency:  [facility] ([facility]. )  Status of Service:  In process, will continue to follow  Medicare Important Message Given:    Date Medicare IM Given:    Medicare IM give by:    Date Additional Medicare IM Given:    Additional Medicare Important Message give by:     If discussed at [place] of Stay Meetings, dates discussed:    Additional Comments: 1553 05-04-15 Jacqlyn Krauss, RN,BSN [phone] CM did receive a call from [place] that pt needs a Peer to Peer to be done via Insurance with MD- in regards to needing SNF. CM did speak with MD and he is willing to do so. Family aware- if insurance denies then plan will be for home with Palliative Services via [facility] and the [place] via [facility]. CM did make referral with [facility] and they will contact family.  Pt will have ambulance transport home if the plan is for home.  CSW consulting the Physician Advisor in regards to disposition needs. CM will monitor. 1630- Received call from Physician Advisor and plan will be for home. Ambulance transport set up via [facility]. No further needs from CM.    1000 05-04-15 Jacqlyn Krauss, [place] [phone] Plan will be for SNF. CSW will assist with disposition needs.    Bethena Roys, RN 05/02/2015, 1:29 PM

## 2015-05-02 NOTE — Plan of Care (Signed)
Problem: Education: Goal: Knowledge of [place] [facility] Education information/materials will improve Outcome: Progressing Patient aware of plan of care.  Voices no questions at this time.  RN provided mediation education on all medications administered thus far this shift.  Patient has denied pain thus far this shift.  RN instructed patient to notify staff if he started to experience any pain, patient stated understanding.

## 2015-05-02 NOTE — Consult Note (Signed)
Consultation Note Date: 05/02/2015   Patient Name: [name]  DOB: Jun 02, 1931  MRN: [id number]  Age / Sex: 82 y.o., male  PCP: Lajean Manes, MD Referring Physician: Lavina Hamman, MD  Reason for Consultation: Establishing goals of care  "82 yo male with a history of prostrate cancer, atrial fibrillation, off anticoagulation, history of atrial flutter, cardiac pacemaker placed in 2009, history of subdural hematoma, status post craniotomy, chronic urinary retention with chronic indwelling Foley with multiple urinary tract infections, chronic kidney disease stage III due to bilateral hydronephrosis, presents to the ER today due to loss of consciousness at Dr. Carlyle Lipa office. Patient upon arrival to the office had a syncopal episode. Patient is followed by Antony Contras, MD who feels that the patient's neurologic decline is multifactorial, combination of damage from prior subdural hemorrhage, cerebrovascular disease, mild dementia and underlying peripheral neuropathy and even possibly paraneoplastic related to prostate cancer."  Clinical Assessment/Narrative: I met today with [name] along with his wife and daughter at bedside. We had a long conversation today regarding his QOL and his weakness. They explain that he has been mostly bedbound for ~6 months now and came into the hospital with UTIs and was never able to recover his strength. Reportedly from notes has memory deficit and mild dementia but he was very appropriate and seemed to have good understanding of his situation. Not completely clear why he has the extreme weakness that he has but he is able to do his bed exercises and slide himself up in bed but cannot ambulate. Family says that they notice no more deficits and that he seems to have good QOL except for now he is mostly home bound and it is very difficult for them to get him to his doctor appointments. We  also discussed the issue of his pacemaker requiring battery change soon. Of this meeting we decided:  - He needs a provider/PCP that can come to his home (too difficult for him to get out) - Eligible for [facility] palliative home services - Would like to try SNF rehab after this hospitalization - Would like to move forward with procedure to update pacemaker battery if Dr. Caryl Comes agrees - His QOL will be best served to be at home and family is looking into private help to make this easier  Contacts/Participants in Discussion: Primary Decision Maker: Self and then wife      Code Status/Advance Care Planning: DNR    Code Status Orders        Start     Ordered   05/01/15 1808  Do not attempt resuscitation (DNR)   Continuous    Question Answer Comment  In the event of cardiac or respiratory ARREST Do not call a "code blue"   In the event of cardiac or respiratory ARREST Do not perform Intubation, CPR, defibrillation or ACLS   In the event of cardiac or respiratory ARREST Use medication by any route, position, wound care, and other measures to relive pain and suffering. May use oxygen, suction and manual treatment of airway obstruction as needed for comfort.      05/01/15 1807    Code Status History    Date Active Date Inactive Code Status Order ID Comments User Context   05/01/2015  6:07 PM 05/01/2015  6:07 PM Full Code [id number]  Reyne Dumas, MD ED   05/01/2015  5:48 PM 05/01/2015  6:07 PM DNR [id number]  Orlie Dakin, MD ED   05/01/2015  3:39 PM 05/01/2015  5:48 PM  DNR [id number]  Orlie Dakin, MD ED   12/07/2014  9:00 PM 12/09/2014 11:34 PM DNR [id number]  Toy Baker, MD Inpatient   06/16/2014 11:49 AM 06/17/2014  3:22 AM Full Code [id number]  Arne Cleveland, MD HOV   07/10/2013  8:55 AM 07/13/2013  2:20 PM Full Code [id number]  Kinnie Feil, MD Inpatient   05/29/2013  2:14 AM 06/04/2013  5:50 PM Full Code [id number]  Toy Baker, MD Inpatient   12/18/2010  2:59 PM 12/21/2010  4:47 PM  Full Code [id number]  Conley Canal, RN Inpatient    Advance Directive Documentation        Most Recent Value   Type of Advance Directive  Out of facility DNR (pink MOST or yellow form)   Pre-existing out of facility DNR order (yellow form or pink MOST form)     "MOST" Form in Place?         Symptom Management:   Syncope: Likely r/t positional changes and orthostatic changes possible along with infection. Also pacemaker battery running low but unsure this is a contributing factor.   Palliative Prophylaxis:   Bowel Regimen, Delirium Protocol and Frequent Pain Assessment   Psycho-social/Spiritual:  Support System: Strong Desire for further Chaplaincy support:no Additional Recommendations: Caregiving  Support/Resources  Prognosis: Unable to determine  Discharge Planning: [place] for rehab with Palliative care service follow-up   Chief Complaint/ Primary Diagnoses: Present on Admission:  . Syncope . Sepsis ([place]) . ATRIAL FIBRILLATION-paroxysmal . Print production planner . Acute renal failure ([place]) . Falls . Anemia, iron deficiency  I have reviewed the medical record, interviewed the patient and family, and examined the patient. The following aspects are pertinent.  Past Medical History  Diagnosis Date  . Orthostatic lightheadedness   . History of subdural hematoma     LEFT CHRONIC SUBDURAL HEMATOMA   S/P PARIETAL CRANIOTOMY WITH EVACUATION HEMATOMA  . Atrial fibrillation, persistent ([place])   . H/O cardiac radiofrequency ablation     A-FLUTTER  . Cardiac pacemaker last check 08-30-2013    05-05-2007  DD  MEDTRONIC GUIDANT INSIGNIA  . RBBB   . Benign essential tremor   . Diverticulosis of colon   . Tachy-brady syndrome ([place])   . SSS (sick sinus syndrome) ([place])   . Hydronephrosis, bilateral   . Renal insufficiency   . Recurrent prostate carcinoma ([place])     2005  S/P RADIOACTIVE SEED IMPLANTS/   BLADDER NECK MASS RESECTED  05/ 2015  SHOWED RECURRENT PROSTATE CANCER   Social History   Social History  . Marital Status: Married    Spouse Name: margaret  . Number of Children: 2  . Years of Education: BA   Occupational History  . retired    Social History Main Topics  . Smoking status: Never Smoker   . Smokeless tobacco: Never Used  . Alcohol Use: 12.6 oz/week    7 Glasses of wine, 7 Standard drinks or equivalent, 7 Shots of liquor per week     Comment: 2 glasses wine day  . Drug Use: No  . Sexual Activity: Not Asked   Other Topics Concern  . None   Social History Narrative   Patient lives at home with his spouse.   Caffeine Use: 2 cups of coffee   Family History  Problem Relation Age of Onset  . Aneurysm Father   . Stroke Father    Scheduled Meds: . feeding supplement (ENSURE ENLIVE)  237 mL Oral BID BM  . heparin  5,000  Units Subcutaneous 3 times per day  . levothyroxine  137 mcg Oral QAC breakfast  . mirabegron ER  25 mg Oral Daily  . piperacillin-tazobactam (ZOSYN)  IV  3.375 g Intravenous Once  . piperacillin-tazobactam (ZOSYN)  IV  3.375 g Intravenous Q8H  . pramipexole  0.25 mg Oral QHS  . propafenone  150 mg Oral Daily  . senna  1 tablet Oral BID  . sodium chloride flush  3 mL Intravenous Q12H  . vancomycin  1,000 mg Intravenous Q24H   Continuous Infusions:  PRN Meds:.acetaminophen **OR** acetaminophen, levalbuterol Medications Prior to Admission:  Prior to Admission medications   Medication Sig Start Date End Date Taking? Authorizing Provider  donepezil (ARICEPT) 10 MG tablet Take 10 mg by mouth at bedtime.   Yes Historical Provider, MD  levothyroxine (SYNTHROID, LEVOTHROID) 137 MCG tablet Take 137 mcg by mouth daily before breakfast.   Yes Historical Provider, MD  mirabegron ER (MYRBETRIQ) 25 MG TB24 tablet Take 25 mg by mouth daily.   Yes Historical Provider, MD  Multiple Vitamin (MULTIVITAMIN WITH MINERALS) TABS tablet Take 1 tablet by mouth daily.   Yes Historical  Provider, MD  pramipexole (MIRAPEX) 0.25 MG tablet Take 0.25 mg by mouth at bedtime. 01/27/15  Yes Historical Provider, MD  Probiotic Product (PROBIOTIC PO) Take 1 tablet by mouth daily.   Yes Historical Provider, MD  propafenone (RYTHMOL) 150 MG tablet Take 150 mg by mouth daily.   Yes Historical Provider, MD  propranolol ER (INDERAL LA) 60 MG 24 hr capsule TAKE ONE CAPSULE BY MOUTH EVERY DAY 03/27/15  Yes Deboraha Sprang, MD  tiZANidine (ZANAFLEX) 2 MG tablet Take 2 mg by mouth every 8 (eight) hours as needed for muscle spasms.   Yes Historical Provider, MD  donepezil (ARICEPT) 23 MG TABS tablet TAKE 1 TABLET (23 MG TOTAL) BY MOUTH AT BEDTIME. Patient not taking: Reported on 05/01/2015 03/07/15   Garvin Fila, MD  HYDROcodone-acetaminophen (NORCO/VICODIN) 5-325 MG tablet Take 1 tablet by mouth every 6 (six) hours as needed for moderate pain.    Historical Provider, MD  megestrol (MEGACE) 20 MG tablet Take 20 mg by mouth 2 (two) times daily.     Historical Provider, MD  propafenone (RYTHMOL) 225 MG tablet TAKE 1 TABLET (225 MG TOTAL) BY MOUTH 2 (TWO) TIMES DAILY. Patient not taking: Reported on 05/01/2015 02/06/15   Deboraha Sprang, MD  zolpidem (AMBIEN) 10 MG tablet Take 10 mg by mouth at bedtime as needed for sleep.  03/29/14   Historical Provider, MD   No Known Allergies  Review of Systems  Physical Exam  Vital Signs: BP 150/69 mmHg  Pulse 54  Temp(Src) 97.5 F (36.4 C) (Oral)  Resp 18  Ht 6' (1.829 m)  Wt 72.8 kg (160 lb 7.9 oz)  BMI 21.76 kg/m2  SpO2 100%  SpO2: SpO2: 100 % O2 Device:SpO2: 100 % O2 Flow Rate: .O2 Flow Rate (L/min): 0 L/min  IO: Intake/output summary:  Intake/Output Summary (Last 24 hours) at 05/02/15 1032 Last data filed at 05/02/15 0833  Gross per 24 hour  Intake   2890 ml  Output   1600 ml  Net   1290 ml    LBM: Last BM Date: 05/01/15 Baseline Weight: Weight: 70.9 kg (156 lb 4.9 oz) Most recent weight: Weight: 72.8 kg (160 lb 7.9 oz)      Palliative  Assessment/Data:    Additional Data Reviewed:  CBC:    Component Value Date/Time   WBC 9.1  05/02/2015 0418   WBC 8.1 12/07/2014 1320   HGB 8.6* 05/02/2015 0418   HGB 7.5* 12/07/2014 1320   HCT 27.4* 05/02/2015 0418   HCT 23.6* 12/07/2014 1320   PLT 255 05/02/2015 0418   PLT 474* 12/07/2014 1320   MCV 87.0 05/02/2015 0418   MCV 84.2 12/07/2014 1320   NEUTROABS 8.5* 05/01/2015 1619   NEUTROABS 6.4 12/07/2014 1320   LYMPHSABS 1.3 05/01/2015 1619   LYMPHSABS 1.0 12/07/2014 1320   MONOABS 0.5 05/01/2015 1619   MONOABS 0.5 12/07/2014 1320   EOSABS 0.2 05/01/2015 1619   EOSABS 0.1 12/07/2014 1320   BASOSABS 0.0 05/01/2015 1619   BASOSABS 0.0 12/07/2014 1320   Comprehensive Metabolic Panel:    Component Value Date/Time   NA 140 05/02/2015 0418   NA 135* 11/16/2014 1015   K 4.3 05/02/2015 0418   K 4.7 11/16/2014 1015   CL 115* 05/02/2015 0418   CO2 14* 05/02/2015 0418   CO2 19* 11/16/2014 1015   BUN 31* 05/02/2015 0418   BUN 32.1* 11/16/2014 1015   CREATININE 1.54* 05/02/2015 0418   CREATININE 1.7* 11/16/2014 1015   GLUCOSE 85 05/02/2015 0418   GLUCOSE 169* 11/16/2014 1015   CALCIUM 8.6* 05/02/2015 0418   CALCIUM 10.0 11/16/2014 1015   AST 22 05/02/2015 0418   AST 16 11/16/2014 1015   ALT 15* 05/02/2015 0418   ALT 18 11/16/2014 1015   ALKPHOS 55 05/02/2015 0418   ALKPHOS 77 11/16/2014 1015   BILITOT 0.7 05/02/2015 0418   BILITOT 0.34 11/16/2014 1015   PROT 6.1* 05/02/2015 0418   PROT 7.3 11/16/2014 1015   ALBUMIN 2.7* 05/02/2015 0418   ALBUMIN 3.9 11/16/2014 1015     Time In: 1200 Time Out: 1400 Time Total: 147mn Greater than 50%  of this time was spent counseling and coordinating care related to the above assessment and plan.  Signed by: PPershing Proud NP  APershing Proud NP  40/45/4098 10:32 AM  Please contact Palliative Medicine Team phone at [phone] questions and concerns.

## 2015-05-02 NOTE — Consult Note (Signed)
WOC wound consult note Reason for Consult: sacral ulcer Wound type: patient has bilateral redness over the buttocks and sacrum, it does blanch.  This appears to be related to moisture, patient is bedbound.  Due to the location and presentation I feel it is MASD (moisture associated skin damage) rather than pressure.  Patient is incontinent of stool.  Pressure Ulcer POA /No Wound bed: red, blanchable skin, intact  Drainage (amount, consistency, odor) none Periwound: intact  Dressing procedure/placement/frequency: No topical dressings needed due to incontinence.  Will add Gerhardt's butt cream, patient has been using other barrier ointments, this may help with soreness and itching.  Discussed POC with patient and bedside nurse.  Re consult if needed, will not follow at this time. Thanks  Becka Lagasse [employer], [employer] [phone])

## 2015-05-02 NOTE — Progress Notes (Signed)
[facility] Progress Note  Patient: [name] [id number]   PCP: [name], MD DOB: [birth year]   DOA: 05/01/2015   DOS: 05/02/2015   Date of Service: the patient was seen and examined on 05/02/2015  Subjective: The patient denies having any acute complaints this morning. No chest pain or shortness of breath. No dizziness or lightheadedness. No nausea no vomiting. Nutrition: Tolerating oral diet  Brief hospital course: Patient was admitted on 05/01/2015, with complaint of passing out episode at his doctor's office patient lost consciousness for about a minute, was found to have SIRS. Currently further plan is continue identifying the infectious etiology and consider for pacemaker battery exchange.  Assessment and Plan: 1. Sepsis ([place]) ruled out Catheter associated UTI On admission patient had leukocytosis of 22 normal lactic acid level, bradycardia but no fever with evidence of pyuria. Patient admitted with suspected diagnosis of sepsis. Patient has chronic indwelling Foley catheter as well as chronic kidney disease likely from prostate cancer. Prior culture has grown enterococcus pansensitive, covered currently continue vancomycin and Zosyn. Catheter exchanged on 05/02/2015. No evidence of systemic infection since leukocytosis has completely resolved and likely appears to be a stress reaction.  2. Sinus bradycardia. Pacemaker implant with AV block. Tachybradycardia syndrome.  Appreciate input from electrophysiology. Patient has a pacemaker which is nearing its end. Continue Rhythmol.  A battery exchange has been planned by cardiology once sepsis picture is clear. At present I feel that there is no evidence of systemic infection and the pacemaker generator can be safely exchanged.  3. Mild dementia, gait difficulty, prior CVA, prior SDH, benign essential tremor. Physical deconditioning. Chronic bedbound state. Pressure ulcers. Patient has been primarily  bedridden due to recurrent fall as well as generalized weakness. Patient has significant tremors and balance issues at his baseline as well. Was followed by neurology in the past. PTOT consulted at present.  4. Chronic kidney disease stage III. Unstageable prostate cancer Renal function appears to be at his baseline. Foley catheter exchange will be performed on 05/02/2015. Monitor clinically and avoid nephrotoxic medication. Pharmacy monitoring for renal dozing off the vancomycin.  5. Hypothyroidism. TSH normal. Continue Synthroid.  Activity: physical therapy consulted Bowel regimen: last BM 05/01/2015 DVT Prophylaxis: subcutaneous Heparin Nutrition: Cardiac diet Advance goals of care discussion: DNR/DNI  Procedures: None Consultants: Electrophysiology Antibiotics: Anti-infectives    Start     Dose/Rate Route Frequency Ordered Stop   05/02/15 2200  vancomycin (VANCOCIN) IVPB 1000 mg/200 mL premix     1,000 mg 200 mL/hr over 60 Minutes Intravenous Every 24 hours 05/01/15 1842     05/02/15 0300  piperacillin-tazobactam (ZOSYN) IVPB 3.375 g     3.375 g 12.5 mL/hr over 240 Minutes Intravenous Every 8 hours 05/01/15 1843     05/01/15 1900  vancomycin (VANCOCIN) 500 mg in sodium chloride 0.9 % 100 mL IVPB     500 mg 100 mL/hr over 60 Minutes Intravenous  Once 05/01/15 1842 05/02/15 0011   05/01/15 1745  piperacillin-tazobactam (ZOSYN) IVPB 3.375 g  Status:  Discontinued     3.375 g 12.5 mL/hr over 240 Minutes Intravenous  Once 05/01/15 1739 05/02/15 1054   05/01/15 1745  vancomycin (VANCOCIN) IVPB 1000 mg/200 mL premix     1,000 mg 200 mL/hr over 60 Minutes Intravenous  Once 05/01/15 1739 05/01/15 1948       Family Communication: no family was present at bedside, at the time of interview.   Disposition:  Expected discharge date: 05/04/2015 Barriers to safe discharge:  Improvement in sepsis physiology, urine culture results, pacemaker generator exchange procedure and  postprocedure course   Intake/Output Summary (Last 24 hours) at 05/02/15 1610 Last data filed at 05/02/15 1600  Gross per 24 hour  Intake   3010 ml  Output   2800 ml  Net    210 ml   Filed Weights   05/01/15 2000 05/02/15 0426  Weight: 70.9 kg (156 lb 4.9 oz) 72.8 kg (160 lb 7.9 oz)    Objective: Physical Exam: Filed Vitals:   05/01/15 1900 05/01/15 2000 05/02/15 0426 05/02/15 1406  BP: 140/62  150/69 136/56  Pulse: 51  54 52  Temp:   97.5 F (36.4 C) 97.8 F (36.6 C)  TempSrc:   Oral Oral  Resp: 15  18   Height:  6' (1.829 m)    Weight:  70.9 kg (156 lb 4.9 oz) 72.8 kg (160 lb 7.9 oz)   SpO2: 100%  100% 93%     General: Appear in mild distress, no Rash; Oral Mucosa moist. Cardiovascular: S1 and S2 Present, no Murmur, difficult to assess JVD Respiratory: Bilateral Air entry present and Clear to Auscultation, no Crackles, no wheezes Abdomen: Bowel Sound present, Soft and no tenderness Extremities: no Pedal edema, no calf tenderness Neurology: Grossly no focal neuro deficit.  Data Reviewed: CBC:  Recent Labs Lab 05/01/15 1619 05/02/15 0418  WBC 22.3* 9.1  NEUTROABS 8.5*  --   HGB 13.0 8.6*  HCT 39.2 27.4*  MCV 92.7 87.0  PLT [id number]   Basic Metabolic Panel:  Recent Labs Lab 05/01/15 1619 05/01/15 1841 05/02/15 0418  NA 137  --  140  K 4.4  --  4.3  CL 108  --  115*  CO2 18*  --  14*  GLUCOSE 110*  --  85  BUN 34*  --  31*  CREATININE 1.58* 1.68* 1.54*  CALCIUM 9.5  --  8.6*  MG  --  1.9  --    Liver Function Tests:  Recent Labs Lab 05/02/15 0418  AST 22  ALT 15*  ALKPHOS 55  BILITOT 0.7  PROT 6.1*  ALBUMIN 2.7*   No results for input(s): LIPASE, AMYLASE in the last 168 hours. No results for input(s): AMMONIA in the last 168 hours.  Cardiac Enzymes: No results for input(s): CKTOTAL, CKMB, CKMBINDEX, TROPONINI in the last 168 hours.  BNP (last 3 results) No results for input(s): BNP in the last 8760 hours.  CBG: No results for  input(s): GLUCAP in the last 168 hours.  Recent Results (from the past 240 hour(s))  Urine culture     Status: None (Preliminary result)   Collection Time: 05/01/15  5:36 PM  Result Value Ref Range Status   Specimen Description URINE, CATHETERIZED  Final   Special Requests NONE  Final   Culture TOO YOUNG TO READ  Final   Report Status PENDING  Incomplete  Culture, blood (Routine X 2) w Reflex to ID Panel     Status: None (Preliminary result)   Collection Time: 05/01/15  6:15 PM  Result Value Ref Range Status   Specimen Description BLOOD RIGHT ANTECUBITAL  Final   Special Requests BOTTLES DRAWN AEROBIC AND ANAEROBIC 5CC  Final   Culture NO GROWTH < 24 HOURS  Final   Report Status PENDING  Incomplete  Culture, blood (Routine X 2) w Reflex to ID Panel     Status: None (Preliminary result)   Collection Time: 05/01/15  6:23 PM  Result Value Ref  Range Status   Specimen Description BLOOD RIGHT WRIST  Final   Special Requests BOTTLES DRAWN AEROBIC AND ANAEROBIC 5CC  Final   Culture NO GROWTH < 24 HOURS  Final   Report Status PENDING  Incomplete     Studies: Dg Chest Port 1 View  05/01/2015  CLINICAL DATA:  Syncopal episode and weakness EXAM: PORTABLE CHEST 1 VIEW COMPARISON:  07/21/2014 FINDINGS: Cardiac shadow is within normal limits. A pacing device is again seen and stable. The lungs are clear. No acute bony abnormality is noted. Calcified granuloma is again noted in the left lung base medially. IMPRESSION: No acute abnormality noted. Electronically Signed   By: Inez Catalina M.D.   On: 05/01/2015 18:09     Scheduled Meds: . feeding supplement (ENSURE ENLIVE)  237 mL Oral BID BM  . Gerhardt's butt cream   Topical Daily  . heparin  5,000 Units Subcutaneous 3 times per day  . levothyroxine  137 mcg Oral QAC breakfast  . mirabegron ER  25 mg Oral Daily  . piperacillin-tazobactam (ZOSYN)  IV  3.375 g Intravenous Q8H  . pramipexole  0.25 mg Oral QHS  . propafenone  150 mg Oral Daily  .  senna  1 tablet Oral BID  . sodium chloride flush  3 mL Intravenous Q12H  . vancomycin  1,000 mg Intravenous Q24H   Continuous Infusions:  PRN Meds: acetaminophen **OR** acetaminophen, levalbuterol  Time spent: 30 minutes  Author: Berle Mull, MD [facility] Pager: [phone] 05/02/2015 4:10 PM  If 7PM-7AM, please contact night-coverage at [email or web address], password [facility]

## 2015-05-03 ENCOUNTER — Encounter (HOSPITAL_COMMUNITY)
Admission: EM | Disposition: A | Discharge status: Home Health | Payer: Self-pay | Source: Home / Self Care | Attending: Internal Medicine

## 2015-05-03 DIAGNOSIS — D509 Iron deficiency anemia, unspecified: Secondary | ICD-10-CM

## 2015-05-03 DIAGNOSIS — E039 Hypothyroidism, unspecified: Secondary | ICD-10-CM

## 2015-05-03 DIAGNOSIS — N183 Chronic kidney disease, stage 3 (moderate): Secondary | ICD-10-CM

## 2015-05-03 DIAGNOSIS — Z950 Presence of cardiac pacemaker: Secondary | ICD-10-CM

## 2015-05-03 DIAGNOSIS — Z515 Encounter for palliative care: Secondary | ICD-10-CM | POA: Insufficient documentation

## 2015-05-03 LAB — COMPREHENSIVE METABOLIC PANEL
ALBUMIN: 2.4 g/dL — AB (ref 3.5–5.0)
ALT: 15 U/L — ABNORMAL LOW (ref 17–63)
ANION GAP: 8 (ref 5–15)
AST: 14 U/L — ABNORMAL LOW (ref 15–41)
Alkaline Phosphatase: 56 U/L (ref 38–126)
BILIRUBIN TOTAL: 0.3 mg/dL (ref 0.3–1.2)
BUN: 26 mg/dL — ABNORMAL HIGH (ref 6–20)
CHLORIDE: 109 mmol/L (ref 101–111)
CO2: 20 mmol/L — ABNORMAL LOW (ref 22–32)
Calcium: 8.8 mg/dL — ABNORMAL LOW (ref 8.9–10.3)
Creatinine, Ser: 1.7 mg/dL — ABNORMAL HIGH (ref 0.61–1.24)
GFR calc Af Amer: 42 mL/min — ABNORMAL LOW (ref >60)
GFR calc non Af Amer: 36 mL/min — ABNORMAL LOW (ref >60)
GLUCOSE: 107 mg/dL — AB (ref 65–99)
POTASSIUM: 3.8 mmol/L (ref 3.5–5.1)
SODIUM: 137 mmol/L (ref 135–145)
TOTAL PROTEIN: 5.8 g/dL — AB (ref 6.5–8.1)

## 2015-05-03 LAB — SURGICAL PCR SCREEN
MRSA, PCR: NEGATIVE
STAPHYLOCOCCUS AUREUS: POSITIVE — AB

## 2015-05-03 LAB — CBC WITH DIFFERENTIAL/PLATELET
BASOS ABS: 0 10*3/uL (ref 0.0–0.1)
BASOS PCT: 0 %
EOS ABS: 0.3 10*3/uL (ref 0.0–0.7)
EOS PCT: 4 %
HEMATOCRIT: 21.6 % — AB (ref 39.0–52.0)
Hemoglobin: 7.1 g/dL — ABNORMAL LOW (ref 13.0–17.0)
Lymphocytes Relative: 19 %
Lymphs Abs: 1.4 10*3/uL (ref 0.7–4.0)
MCH: 28.1 pg (ref 26.0–34.0)
MCHC: 32.9 g/dL (ref 30.0–36.0)
MCV: 85.4 fL (ref 78.0–100.0)
MONO ABS: 0.5 10*3/uL (ref 0.1–1.0)
MONOS PCT: 7 %
Neutro Abs: 5.1 10*3/uL (ref 1.7–7.7)
Neutrophils Relative %: 70 %
PLATELETS: 243 10*3/uL (ref 150–400)
RBC: 2.53 MIL/uL — ABNORMAL LOW (ref 4.22–5.81)
RDW: 16 % — AB (ref 11.5–15.5)
WBC: 7.3 10*3/uL (ref 4.0–10.5)

## 2015-05-03 LAB — PROTIME-INR
INR: 1.24 (ref 0.00–1.49)
Prothrombin Time: 15.8 seconds — ABNORMAL HIGH (ref 11.6–15.2)

## 2015-05-03 LAB — PREPARE RBC (CROSSMATCH)

## 2015-05-03 LAB — URINE CULTURE

## 2015-05-03 LAB — MAGNESIUM: Magnesium: 1.6 mg/dL — ABNORMAL LOW (ref 1.7–2.4)

## 2015-05-03 SURGERY — PPM/BIV PPM GENERATOR CHANGEOUT
Anesthesia: LOCAL

## 2015-05-03 MED ORDER — FUROSEMIDE 10 MG/ML IJ SOLN
20.0000 mg | Freq: Once | INTRAMUSCULAR | Status: AC
  Administered 2015-05-04: 20 mg via INTRAVENOUS
  Filled 2015-05-03: qty 2

## 2015-05-03 MED ORDER — ENSURE ENLIVE PO LIQD
237.0000 mL | Freq: Three times a day (TID) | ORAL | Status: DC
  Administered 2015-05-03 – 2015-05-04 (×2): 237 mL via ORAL

## 2015-05-03 MED ORDER — SODIUM CHLORIDE 0.9 % IV SOLN: Freq: Once | INTRAVENOUS | Status: DC

## 2015-05-03 MED ORDER — MAGNESIUM OXIDE 400 (241.3 MG) MG PO TABS
400.0000 mg | ORAL_TABLET | Freq: Every day | ORAL | Status: DC
  Administered 2015-05-03 – 2015-05-04 (×2): 400 mg via ORAL
  Filled 2015-05-03 (×2): qty 1

## 2015-05-03 NOTE — NC FL2 (Signed)
[place] LEVEL OF CARE SCREENING TOOL     IDENTIFICATION  Patient Name: [name] Birthdate: 1931/12/03 Sex: male Admission Date (Current Location): 05/01/2015  [facility] and [place] Number:  Herbalist and Address:  [facility], [place] [address] [place], [place] [address]      Provider Number: [id number]  Attending Physician Name and Address:  Bonnielee Haff, MD  Relative Name and Phone Number:       Current Level of Care: SNF Recommended Level of Care: [place] Prior Approval Number:    Date Approved/Denied:   PASRR Number: [id number]  Discharge Plan: SNF    Current Diagnoses: Patient Active Problem List   Diagnosis Date Noted  . Palliative care encounter   . Pressure ulcer 05/02/2015  . UTI (urinary tract infection) 05/02/2015  . CKD (chronic kidney disease) stage 3, GFR 30-59 ml/min 05/02/2015  . Syncope 05/01/2015  . Anemia, iron deficiency 12/08/2014  . Weakness 12/07/2014  . Acute on chronic renal failure ([place]) 12/07/2014  . Falls 07/21/2014  . Right leg weakness 07/21/2014  . Anemia in chronic kidney disease (CKD)   . UTI (lower urinary tract infection) 07/10/2013  . Sepsis ([place]) 07/10/2013  . Prostate cancer 06/02/2013  . Unspecified constipation 06/02/2013  . Bladder mass 06/01/2013  . ARF (acute renal failure) ([place]) 05/29/2013  . Hyperkalemia 05/29/2013  . Acute renal failure ([place]) 05/28/2013  . Anemia 05/28/2013  . Atrial tachycardia ([place]) 10/23/2011  . S/P left total knee replacement 12/19/2010  . Pacemaker-dual-chamber-[employer] 06/07/2010  . Orthostatic lightheadedness 06/07/2010  . Mitral regurgitation 06/07/2010  . AV BLOCK, COMPLETE 04/25/2009  . ATRIAL FIBRILLATION-paroxysmal 05/12/2008  . SICK SINUS/ TACHY-BRADY SYNDROME 05/12/2008    Orientation RESPIRATION BLADDER Height & Weight     Self, Time, Situation, Place  Normal Indwelling catheter Weight: 163 lb  9.3 oz (74.2 kg) Height:  6' (182.9 cm)  BEHAVIORAL SYMPTOMS/MOOD NEUROLOGICAL BOWEL NUTRITION STATUS      Continent Diet (cardiac)  AMBULATORY STATUS COMMUNICATION OF NEEDS Skin   Extensive Assist Verbally PU Stage and Appropriate Care                       Personal Care Assistance Level of Assistance  Bathing, Dressing Bathing Assistance: Maximum assistance   Dressing Assistance: Maximum assistance     Functional Limitations Info             SPECIAL CARE FACTORS FREQUENCY  PT (By licensed PT), OT (By licensed OT)     PT Frequency: 5/wk OT Frequency: 5/wk            Contractures      Additional Factors Info  Code Status, Allergies Code Status Info: DNR Allergies Info: NKA           Current Medications (05/03/2015):  This is the current hospital active medication list Current Facility-Administered Medications  Medication Dose Route Frequency Provider Last Rate Last Dose  . 0.9 %  sodium chloride infusion   Intravenous Once Bonnielee Haff, MD      . acetaminophen (TYLENOL) tablet 650 mg  650 mg Oral Q6H PRN Reyne Dumas, MD   650 mg at 05/03/15 0514   Or  . acetaminophen (TYLENOL) suppository 650 mg  650 mg Rectal Q6H PRN Reyne Dumas, MD      . acidophilus (RISAQUAD) capsule 1 capsule  1 capsule Oral Daily Lavina Hamman, MD   1 capsule at 05/03/15  [id number]  . donepezil (ARICEPT) tablet 10 mg  10 mg Oral QHS Lavina Hamman, MD   10 mg at 05/02/15 2103  . feeding supplement (ENSURE ENLIVE) (ENSURE ENLIVE) liquid 237 mL  237 mL Oral BID BM Reyne Dumas, MD   237 mL at 05/02/15 1500  . furosemide (LASIX) injection 20 mg  20 mg Intravenous Once Bonnielee Haff, MD      . furosemide (LASIX) injection 20 mg  20 mg Intravenous Once Bonnielee Haff, MD      . Gerhardt's butt cream   Topical Daily Lavina Hamman, MD      . heparin injection 5,000 Units  5,000 Units Subcutaneous 3 times per day Reyne Dumas, MD   5,000 Units at 05/03/15 0516  . levalbuterol (XOPENEX)  nebulizer solution 0.63 mg  0.63 mg Nebulization Q6H PRN Reyne Dumas, MD      . levothyroxine (SYNTHROID, LEVOTHROID) tablet 137 mcg  137 mcg Oral QAC breakfast Reyne Dumas, MD   137 mcg at 05/03/15 0637  . magnesium oxide (MAG-OX) tablet 400 mg  400 mg Oral Daily Bonnielee Haff, MD   400 mg at 05/03/15 0959  . mirabegron ER (MYRBETRIQ) tablet 25 mg  25 mg Oral Daily Reyne Dumas, MD   25 mg at 05/03/15 0959  . pramipexole (MIRAPEX) tablet 0.25 mg  0.25 mg Oral QHS Reyne Dumas, MD   0.25 mg at 05/02/15 2106  . propafenone (RYTHMOL) tablet 150 mg  150 mg Oral Daily Reyne Dumas, MD   150 mg at 05/03/15 1308  . senna (SENOKOT) tablet 8.6 mg  1 tablet Oral BID Reyne Dumas, MD   8.6 mg at 05/03/15 0959  . sodium chloride flush (NS) 0.9 % injection 3 mL  3 mL Intravenous Q12H Reyne Dumas, MD   3 mL at 05/02/15 1047  . tiZANidine (ZANAFLEX) tablet 2 mg  2 mg Oral Q8H PRN Lavina Hamman, MD   2 mg at 05/03/15 0516  . zolpidem (AMBIEN) tablet 5 mg  5 mg Oral QHS PRN Lavina Hamman, MD         Discharge Medications: Please see discharge summary for a list of discharge medications.  Relevant Imaging Results:  Relevant Lab Results:   Additional Information SS# [id number]  Cranford Mon, [place]

## 2015-05-03 NOTE — Progress Notes (Signed)
Daily Progress Note   Patient Name: [name]       Date: 05/03/2015 DOB: 03-Aug-1931  Age: 82 y.o. MRN#: [id number] Attending Physician: Bonnielee Haff, MD Primary Care Physician: [name], MD Admit Date: 05/01/2015  Reason for Consultation/Follow-up: Establishing goals of care  Subjective: [name] is doing well today. No complaints. No changes to current plan. Will await and see if SNF is an option for short term and the plan is to return home from there. Happy to hear he does not need pacemaker battery changed - patient and family pleased with this. Daughter, Lelon Frohlich, at bedside today. No complaints. I did give them information regarding home visits PCP and information about [facility] palliative care services. All questions/concerns addressed. No additional recommendations.    Length of Stay: 2 days  Current Medications: Scheduled Meds:  . sodium chloride   Intravenous Once  . acidophilus  1 capsule Oral Daily  . donepezil  10 mg Oral QHS  . feeding supplement (ENSURE ENLIVE)  237 mL Oral TID BM  . furosemide  20 mg Intravenous Once  . furosemide  20 mg Intravenous Once  . Gerhardt's butt cream   Topical Daily  . heparin  5,000 Units Subcutaneous 3 times per day  . levothyroxine  137 mcg Oral QAC breakfast  . magnesium oxide  400 mg Oral Daily  . mirabegron ER  25 mg Oral Daily  . pramipexole  0.25 mg Oral QHS  . propafenone  150 mg Oral Daily  . senna  1 tablet Oral BID  . sodium chloride flush  3 mL Intravenous Q12H    Continuous Infusions:    PRN Meds: acetaminophen **OR** acetaminophen, levalbuterol, tiZANidine, zolpidem  Physical Exam: Physical Exam  Constitutional: He is oriented to person, place, and time. He appears well-developed.  HENT:  Head:  Normocephalic and atraumatic.  Cardiovascular: Bradycardia present.   Pulmonary/Chest: Effort normal. No accessory muscle usage. No tachypnea. No respiratory distress.  Abdominal: Soft. Normal appearance.  Neurological: He is alert and oriented to person, place, and time.                Vital Signs: BP 112/68 mmHg  Pulse 67  Temp(Src) 97.9 F (36.6 C) (Oral)  Resp 19  Ht 6' (1.829 m)  Wt 74.2 kg (163 lb 9.3  oz)  BMI 22.18 kg/m2  SpO2 100% SpO2: SpO2: 100 % O2 Device: O2 Device: Not Delivered O2 Flow Rate: O2 Flow Rate (L/min): 0 L/min  Intake/output summary:  Intake/Output Summary (Last 24 hours) at 05/03/15 2007 Last data filed at 05/03/15 1500  Gross per 24 hour  Intake    360 ml  Output   3200 ml  Net  -2840 ml   LBM: Last BM Date: 05/01/15 Baseline Weight: Weight: 70.9 kg (156 lb 4.9 oz) Most recent weight: Weight: 74.2 kg (163 lb 9.3 oz)       Palliative Assessment/Data:   Additional Data Reviewed: CBC    Component Value Date/Time   WBC 7.3 05/03/2015 0654   WBC 8.1 12/07/2014 1320   RBC 2.53* 05/03/2015 0654   RBC 2.35* 12/08/2014 0200   RBC 2.80* 12/07/2014 1320   HGB 7.1* 05/03/2015 0654   HGB 7.5* 12/07/2014 1320   HCT 21.6* 05/03/2015 0654   HCT 23.6* 12/07/2014 1320   PLT 243 05/03/2015 0654   PLT 474* 12/07/2014 1320   MCV 85.4 05/03/2015 0654   MCV 84.2 12/07/2014 1320   MCH 28.1 05/03/2015 0654   MCH 26.9* 12/07/2014 1320   MCHC 32.9 05/03/2015 0654   MCHC 31.9* 12/07/2014 1320   RDW 16.0* 05/03/2015 0654   RDW 16.7* 12/07/2014 1320   LYMPHSABS 1.4 05/03/2015 0654   LYMPHSABS 1.0 12/07/2014 1320   MONOABS 0.5 05/03/2015 0654   MONOABS 0.5 12/07/2014 1320   EOSABS 0.3 05/03/2015 0654   EOSABS 0.1 12/07/2014 1320   BASOSABS 0.0 05/03/2015 0654   BASOSABS 0.0 12/07/2014 1320    CMP     Component Value Date/Time   NA 137 05/03/2015 0654   NA 135* 11/16/2014 1015   K 3.8 05/03/2015 0654   K 4.7 11/16/2014 1015   CL 109 05/03/2015  0654   CO2 20* 05/03/2015 0654   CO2 19* 11/16/2014 1015   GLUCOSE 107* 05/03/2015 0654   GLUCOSE 169* 11/16/2014 1015   BUN 26* 05/03/2015 0654   BUN 32.1* 11/16/2014 1015   CREATININE 1.70* 05/03/2015 0654   CREATININE 1.7* 11/16/2014 1015   CALCIUM 8.8* 05/03/2015 0654   CALCIUM 10.0 11/16/2014 1015   PROT 5.8* 05/03/2015 0654   PROT 7.3 11/16/2014 1015   ALBUMIN 2.4* 05/03/2015 0654   ALBUMIN 3.9 11/16/2014 1015   AST 14* 05/03/2015 0654   AST 16 11/16/2014 1015   ALT 15* 05/03/2015 0654   ALT 18 11/16/2014 1015   ALKPHOS 56 05/03/2015 0654   ALKPHOS 77 11/16/2014 1015   BILITOT 0.3 05/03/2015 0654   BILITOT 0.34 11/16/2014 1015   GFRNONAA 36* 05/03/2015 0654   GFRAA 42* 05/03/2015 0654       Problem List:  Patient Active Problem List   Diagnosis Date Noted  . Palliative care encounter   . Pressure ulcer 05/02/2015  . UTI (urinary tract infection) 05/02/2015  . CKD (chronic kidney disease) stage 3, GFR 30-59 ml/min 05/02/2015  . Syncope 05/01/2015  . Anemia, iron deficiency 12/08/2014  . Weakness 12/07/2014  . Acute on chronic renal failure ([place]) 12/07/2014  . Falls 07/21/2014  . Right leg weakness 07/21/2014  . Anemia in chronic kidney disease (CKD)   . UTI (lower urinary tract infection) 07/10/2013  . Sepsis ([place]) 07/10/2013  . Prostate cancer 06/02/2013  . Unspecified constipation 06/02/2013  . Bladder mass 06/01/2013  . ARF (acute renal failure) ([place]) 05/29/2013  . Hyperkalemia 05/29/2013  . Acute renal failure ([place]) 05/28/2013  .  Anemia 05/28/2013  . Atrial tachycardia ([place]) 10/23/2011  . S/P left total knee replacement 12/19/2010  . Pacemaker-dual-chamber-[employer] 06/07/2010  . Orthostatic lightheadedness 06/07/2010  . Mitral regurgitation 06/07/2010  . AV BLOCK, COMPLETE 04/25/2009  . ATRIAL FIBRILLATION-paroxysmal 05/12/2008  . SICK SINUS/ TACHY-BRADY SYNDROME 05/12/2008     Palliative Care Assessment & Plan    1.Code  Status:  DNR    Code Status Orders        Start     Ordered   05/01/15 1808  Do not attempt resuscitation (DNR)   Continuous    Question Answer Comment  In the event of cardiac or respiratory ARREST Do not call a "code blue"   In the event of cardiac or respiratory ARREST Do not perform Intubation, CPR, defibrillation or ACLS   In the event of cardiac or respiratory ARREST Use medication by any route, position, wound care, and other measures to relive pain and suffering. May use oxygen, suction and manual treatment of airway obstruction as needed for comfort.      05/01/15 1807    Code Status History    Date Active Date Inactive Code Status Order ID Comments User Context   05/01/2015  6:07 PM 05/01/2015  6:07 PM Full Code [id number]  Reyne Dumas, MD ED   05/01/2015  5:48 PM 05/01/2015  6:07 PM DNR [id number]  Orlie Dakin, MD ED   05/01/2015  3:39 PM 05/01/2015  5:48 PM DNR [id number]  Orlie Dakin, MD ED   12/07/2014  9:00 PM 12/09/2014 11:34 PM DNR [id number]  Toy Baker, MD Inpatient   06/16/2014 11:49 AM 06/17/2014  3:22 AM Full Code [id number]  Arne Cleveland, MD HOV   07/10/2013  8:55 AM 07/13/2013  2:20 PM Full Code [id number]  Kinnie Feil, MD Inpatient   05/29/2013  2:14 AM 06/04/2013  5:50 PM Full Code [id number]  Toy Baker, MD Inpatient   12/18/2010  2:59 PM 12/21/2010  4:47 PM Full Code [id number]  Conley Canal, RN Inpatient    Advance Directive Documentation        Most Recent Value   Type of Advance Directive  Out of facility DNR (pink MOST or yellow form)   Pre-existing out of facility DNR order (yellow form or pink MOST form)     "MOST" Form in Place?         2. Goals of Care/Additional Recommendations:  They are hopeful to maximize QOL and to keep him at home as long as possible with additional resources to help his wife.   Desire for further Chaplaincy support:no  Psycho-social Needs: Caregiving  Support/Resources  3. Symptom Management:       1. Syncope: Likely r/t positional changes and orthostatic changes possible along with infection/UTI.   4. Palliative Prophylaxis:   Bowel Regimen, Delirium Protocol and Frequent Pain Assessment  5. Prognosis: Unable to determine  6. Discharge Planning:  SNF rehab vs home with home health and palliative services   Thank you for allowing the Palliative Medicine Team to assist in the care of this patient.   Time In: 1330 Time Out: 1400 Total Time 59min Prolonged Time Billed  no         [name] Proud, NP  [id number], 8:07 PM  Please contact Palliative Medicine Team phone at [phone] for questions and concerns.

## 2015-05-03 NOTE — Progress Notes (Addendum)
Patient Name: [name]      SUBJECTIVE:alert without complaints   Past Medical History  Diagnosis Date  . Orthostatic lightheadedness   . History of subdural hematoma     LEFT CHRONIC SUBDURAL HEMATOMA   S/P PARIETAL CRANIOTOMY WITH EVACUATION HEMATOMA  . Atrial fibrillation, persistent ([place])   . H/O cardiac radiofrequency ablation     A-FLUTTER  . Cardiac pacemaker last check 08-30-2013    05-05-2007  DD  [employer] GUIDANT INSIGNIA  . RBBB   . Benign essential tremor   . Diverticulosis of colon   . Tachy-brady syndrome ([place])   . SSS (sick sinus syndrome) ([place])   . Hydronephrosis, bilateral   . Renal insufficiency   . Recurrent prostate carcinoma ([place])     2005  S/P RADIOACTIVE SEED IMPLANTS/   BLADDER NECK MASS RESECTED 05/ 2015  SHOWED RECURRENT PROSTATE CANCER    Scheduled Meds:  Scheduled Meds: . sodium chloride   Intravenous Once  . acidophilus  1 capsule Oral Daily  . donepezil  10 mg Oral QHS  . feeding supplement (ENSURE ENLIVE)  237 mL Oral BID BM  . furosemide  20 mg Intravenous Once  . furosemide  20 mg Intravenous Once  . Gerhardt's butt cream   Topical Daily  . heparin  5,000 Units Subcutaneous 3 times per day  . levothyroxine  137 mcg Oral QAC breakfast  . magnesium oxide  400 mg Oral Daily  . mirabegron ER  25 mg Oral Daily  . pramipexole  0.25 mg Oral QHS  . propafenone  150 mg Oral Daily  . senna  1 tablet Oral BID  . sodium chloride flush  3 mL Intravenous Q12H   Continuous Infusions:  acetaminophen **OR** acetaminophen, levalbuterol, tiZANidine, zolpidem    PHYSICAL EXAM Filed Vitals:   05/02/15 0426 05/02/15 1406 05/02/15 2020 05/03/15 0500  BP: 150/69 136/56 125/60 132/53  Pulse: 54 52 54 58  Temp: 97.5 F (36.4 C) 97.8 F (36.6 C) 97.7 F (36.5 C) 97.9 F (36.6 C)  TempSrc: Oral Oral Oral Oral  Resp: 18  18 16   Height:      Weight: 160 lb 7.9 oz (72.8 kg)   163 lb 9.3 oz (74.2 kg)  SpO2: 100% 93% 100% 100%     Well developed and nourished in no acute distress HENT normal Neck supple with JVP-flat Clear Regular rate and rhythm, no murmurs or gallops Abd-soft with active BS No Clubbing cyanosis edema Skin-warm and dry A & Oriented  Lying flat in bed  TELEMETRY: Reviewed telemetry pt in sinus:    Intake/Output Summary (Last 24 hours) at 05/03/15 1338 Last data filed at 05/03/15 0500  Gross per 24 hour  Intake    240 ml  Output   1700 ml  Net  -1460 ml    LABS: Basic Metabolic Panel:  Recent Labs Lab 05/01/15 1619 05/01/15 1841 05/02/15 0418 05/03/15 0654  NA 137  --  140 137  K 4.4  --  4.3 3.8  CL 108  --  115* 109  CO2 18*  --  14* 20*  GLUCOSE 110*  --  85 107*  BUN 34*  --  31* 26*  CREATININE 1.58* 1.68* 1.54* 1.70*  CALCIUM 9.5  --  8.6* 8.8*  MG  --  1.9  --  1.6*   Cardiac Enzymes: No results for input(s): CKTOTAL, CKMB, CKMBINDEX, TROPONINI in the last 72 hours. CBC:  Recent Labs  Lab 05/01/15 1619 05/02/15 0418 05/03/15 0654  WBC 22.3* 9.1 7.3  NEUTROABS 8.5*  --  5.1  HGB 13.0 8.6* 7.1*  HCT 39.2 27.4* 21.6*  MCV 92.7 87.0 85.4  PLT 219 255 243   PROTIME:  Recent Labs  05/01/15 1841 05/03/15 0654  LABPROT 16.3* 15.8*  INR 1.30 1.24   Liver Function Tests:  Recent Labs  05/02/15 0418 05/03/15 0654  AST 22 14*  ALT 15* 15*  ALKPHOS 55 56  BILITOT 0.7 0.3  PROT 6.1* 5.8*  ALBUMIN 2.7* 2.4*   No results for input(s): LIPASE, AMYLASE in the last 72 hours. BNP: BNP (last 3 results) No results for input(s): BNP in the last 8760 hours.  ProBNP (last 3 results) No results for input(s): PROBNP in the last 8760 hours.  D-Dimer: No results for input(s): DDIMER in the last 72 hours. Hemoglobin A1C: No results for input(s): HGBA1C in the last 72 hours. Fasting Lipid Panel: No results for input(s): CHOL, HDL, LDLCALC, TRIG, CHOLHDL, LDLDIRECT in the last 72 hours. Thyroid Function Tests:  Recent Labs  05/01/15 1840  TSH 0.504    Anemia Panel: No results for input(s): VITAMINB12, FOLATE, FERRITIN, TIBC, IRON, RETICCTPCT in the last 72 hours.   Device Interrogation 6 months not pacing   ASSESSMENT AND PLAN:  Principal Problem:   Sepsis ([place]) Active Problems:   ATRIAL FIBRILLATION-paroxysmal   Pacemaker-dual-chamber-[employer]   Prostate cancer   Anemia in chronic kidney disease (CKD)   Falls   Weakness   Anemia, iron deficiency   Syncope   Pressure ulcer   UTI (urinary tract infection)   CKD (chronic kidney disease) stage 3, GFR 30-59 ml/min   Palliative care encounter  Syncope almost certainly vasomotor No clear indication for replacing pacemaker and so after long dissussion with pt and then pt and family have decided to NOT proceed Would push OOB and rehab as possible Abdominal binder and thigh high compression may help Agree with transfusion   Spent 27 min first visit and 39 min second visit to review with family    Signed, Virl Axe MD  05/03/2015

## 2015-05-03 NOTE — Clinical Social Work Note (Signed)
Clinical Social Work Assessment  Patient Details  Name: [name] MRN: [id number] Date of Birth: February 22, 1931  Date of referral:  05/03/15               Reason for consult:  Facility Placement                Permission sought to share information with:  Family Supports Permission granted to share information::  Yes, Verbal Permission Granted  Name::     Guerry Bruin  Agency::  [facility] SNF  Relationship::  daughters  Contact Information:     Housing/Transportation Living arrangements for the past 2 months:  Single Family Home Source of Information:  Patient, Adult Children Patient Interpreter Needed:  None Criminal Activity/Legal Involvement Pertinent to Current Situation/Hospitalization:  No - Comment as needed Significant Relationships:  Adult Children, Spouse Lives with:  Spouse Do you feel safe going back to the place where you live?  No Need for family participation in patient care:  Yes (Comment) (ADL)  Care giving concerns:  Pt lives at home with wife- pt dtr from [place] has been living with them but will have to return home soon.  Other daughters who live locally unable to care for pt during the day due to work schedule   Facilities manager / plan:  CSW spoke with pt and pt dtr about MD recommendation and family preference for SNF placement.  CSW explained referral process and need for insurance approval- discussed possible barrier of insurance approval since pt has been immobile for months.  Employment status:  Retired Nurse, adult PT Recommendations:  [place] / Referral to community resources:  [place]  Patient/Family's Response to care: Pt and dtrs are appreciative of efforts and are understanding of possible insurance barriers.  Patient/Family's Understanding of and Emotional Response to Diagnosis, Current Treatment, and Prognosis: Pt dtr is hopeful for SNF admission and  feeling somewhat overwhelmed with pt needs at this time and concern for pt and pt spouse living at home alone.  Emotional Assessment Appearance:  Appears stated age Attitude/Demeanor/Rapport:  Unable to Assess Affect (typically observed):  Unable to Assess Orientation:  Oriented to Self, Oriented to Place, Oriented to  Time, Oriented to Situation Alcohol / Substance use:  Not Applicable Psych involvement (Current and /or in the community):  No (Comment)  Discharge Needs  Concerns to be addressed:  Care Coordination Readmission within the last 30 days:  No Current discharge risk:  Physical Impairment Barriers to Discharge:  Continued Medical Work up   [employer], LCSW 05/03/2015, 5:10 PM

## 2015-05-03 NOTE — Evaluation (Signed)
Occupational Therapy Evaluation and Discharge Patient Details Name: [name] MRN: [id number] DOB: 06-15-1931 Today's Date: 05/03/2015    History of Present Illness Pt admitted after syncopal episode at his PVP's office with bradycardia, weakness, UTI. PMH: CKD, prostate ca with chronic foley, tremor, CVA, SDH, mild dementia.   Clinical Impression   Pt is dependent in ADL and essentially bedbound at his baseline. Required +2 total assist for bed mobility and sat EOB x approximately 15 minutes. No OT needs identified.    Follow Up Recommendations  No OT follow up;Supervision/Assistance - 24 hour    Equipment Recommendations   (mattress overlay)    Recommendations for Other Services       Precautions / Restrictions Precautions Precautions: Fall Restrictions Weight Bearing Restrictions: No      Mobility Bed Mobility Overal bed mobility: Needs Assistance Bed Mobility: Rolling;Supine to Sit;Sit to Supine Rolling: Mod assist   Supine to sit: +2 for physical assistance;Total assist Sit to supine: +2 for physical assistance;Total assist   General bed mobility comments: Assist for all aspects, pt with increased extensor tone initially and anxiety about falling  Transfers                 General transfer comment: not performed    Balance Overall balance assessment: Needs assistance Sitting-balance support: Feet supported Sitting balance-Leahy Scale: Poor Sitting balance - Comments: max initially, progressed to min, increased extensor tone when using UEs to blow his nose Postural control: Left lateral lean;Posterior lean                                  ADL Overall ADL's : At baseline                                       General ADL Comments: performed pericare and changed pad     Vision Additional Comments: pt reports difficulty reading small print   Perception     Praxis      Pertinent Vitals/Pain Pain Assessment:  No/denies pain     Hand Dominance Left   Extremity/Trunk Assessment Upper Extremity Assessment Upper Extremity Assessment: Generalized weakness (tremor)   Lower Extremity Assessment Lower Extremity Assessment: Defer to PT evaluation       Communication Communication Communication: No difficulties   Cognition Arousal/Alertness: Awake/alert Behavior During Therapy: Anxious (when sitting up) Overall Cognitive Status: History of cognitive impairments - at baseline (pt with baseline mild dementia)                     General Comments       Exercises       Shoulder Instructions      Home Living Family/patient expects to be discharged to:: Private residence Living Arrangements: Spouse/significant other Available Help at Discharge: Family Type of Home:  (condo) Home Access: Ramped entrance     Home Layout: One level     Bathroom Shower/Tub: Occupational psychologist: Handicapped height Bathroom Accessibility: Yes   Home Equipment: Environmental consultant - 2 wheels;Wheelchair - [employer];Toilet riser;Hospital bed          Prior Functioning/Environment Level of Independence: Needs assistance  Gait / Transfers Assistance Needed: has not ambulated since July 2016, gets up to chair one time per week with PT and tolerates x 1 hour ADL's / Homemaking  Assistance Needed: total assist for sponge bathing, dressing, sometimes needs help with eating depending on tremor, performs grooming with set up at bed level        OT Diagnosis: Generalized weakness;Cognitive deficits   OT Problem List:     OT Treatment/Interventions:      OT Goals(Current goals can be found in the care plan section) Acute Rehab OT Goals Patient Stated Goal: return home  OT Frequency:     Barriers to D/C:            Co-evaluation PT/OT/SLP Co-Evaluation/Treatment: Yes Reason for Co-Treatment: For patient/therapist safety   OT goals addressed during session: ADL's and  self-care      End of Session Nurse Communication: Need for lift equipment  Activity Tolerance: Patient tolerated treatment well Patient left: in bed;with call bell/phone within reach;with bed alarm set;with family/visitor present   Time: [id number] OT Time Calculation (min): 30 min Charges:  OT General Charges $OT Visit: 1 Procedure OT Evaluation $OT Eval Moderate Complexity: 1 Procedure G-Codes:    Malka So 05/03/2015, 10:59 AM  [phone]

## 2015-05-03 NOTE — Progress Notes (Signed)
CSW consulted for placement needs- CSW initiated [employer] approval- they will have to have medical director review case since pt has been nonambulatory for 6 months and unsure if pt has skillable needs at this time.  CSW will continue to follow  Domenica Reamer, [place] Social Worker [phone]

## 2015-05-03 NOTE — Progress Notes (Addendum)
[facility] PROGRESS NOTE  [name] [id number] DOB: 1931/04/04 DOA: 05/01/2015  PCP: [name], MD  Brief HPI: 82 year old Caucasian male with a past medical history of atrial fibrillation, not on anticoagulation, history of atrial flutter, cardiac pacemaker placement, history of subdural hematoma, prostate cancer, presented to the emergency department after a syncopal episode at his PCP office. Patient also has a history of neurological decline thought to be due to a consultation of prior subdural hematoma, stroke and dementia along with neuropathy.  Past medical history:  Past Medical History  Diagnosis Date  . Orthostatic lightheadedness   . History of subdural hematoma     LEFT CHRONIC SUBDURAL HEMATOMA   S/P PARIETAL CRANIOTOMY WITH EVACUATION HEMATOMA  . Atrial fibrillation, persistent ([place])   . H/O cardiac radiofrequency ablation     A-FLUTTER  . Cardiac pacemaker last check 08-30-2013    05-05-2007  DD  [employer] GUIDANT INSIGNIA  . RBBB   . Benign essential tremor   . Diverticulosis of colon   . Tachy-brady syndrome ([place])   . SSS (sick sinus syndrome) ([place])   . Hydronephrosis, bilateral   . Renal insufficiency   . Recurrent prostate carcinoma ([place])     2005  S/P RADIOACTIVE SEED IMPLANTS/   BLADDER NECK MASS RESECTED 05/ 2015  SHOWED RECURRENT PROSTATE CANCER    Consultants: Cardiology  Procedures: None. So far  Antibiotics: Patient was on vancomycin and Zosyn, which was discontinued 4/19  Subjective: Patient denies any chest pain or shortness of breath this morning. No dizziness or lightheadedness. No nausea or vomiting. Waiting to speak to Dr. Caryl Comes regarding his pacemaker.  Objective:  Vital Signs  Filed Vitals:   05/02/15 0426 05/02/15 1406 05/02/15 2020 05/03/15 0500  BP: 150/69 136/56 125/60 132/53  Pulse: 54 52 54 58  Temp: 97.5 F (36.4 C) 97.8 F (36.6 C) 97.7 F (36.5 C) 97.9 F (36.6 C)  TempSrc: Oral Oral Oral  Oral  Resp: 18  18 16   Height:      Weight: 72.8 kg (160 lb 7.9 oz)   74.2 kg (163 lb 9.3 oz)  SpO2: 100% 93% 100% 100%    Intake/Output Summary (Last 24 hours) at 05/03/15 1300 Last data filed at 05/03/15 0500  Gross per 24 hour  Intake    240 ml  Output   1700 ml  Net  -1460 ml   Filed Weights   05/01/15 2000 05/02/15 0426 05/03/15 0500  Weight: 70.9 kg (156 lb 4.9 oz) 72.8 kg (160 lb 7.9 oz) 74.2 kg (163 lb 9.3 oz)    General appearance: alert, cooperative, appears stated age and no distress Resp: clear to auscultation bilaterally Cardio: regular rate and rhythm, S1, S2 normal, no murmur, click, rub or gallop GI: soft, non-tender; bowel sounds normal; no masses,  no organomegaly Extremities: extremities normal, atraumatic, no cyanosis or edema Neurologic: Awake and alert. Oriented 3. No focal neurological deficits.  Lab Results:  Data Reviewed: I have personally reviewed following labs and imaging studies  CBC:  Recent Labs Lab 05/01/15 1619 05/02/15 0418 05/03/15 0654  WBC 22.3* 9.1 7.3  NEUTROABS 8.5*  --  5.1  HGB 13.0 8.6* 7.1*  HCT 39.2 27.4* 21.6*  MCV 92.7 87.0 85.4  PLT 219 255 0000000   Basic Metabolic Panel:  Recent Labs Lab 05/01/15 1619 05/01/15 1841 05/02/15 0418 05/03/15 0654  NA 137  --  140 137  K 4.4  --  4.3 3.8  CL 108  --  115* 109  CO2 18*  --  14* 20*  GLUCOSE 110*  --  85 107*  BUN 34*  --  31* 26*  CREATININE 1.58* 1.68* 1.54* 1.70*  CALCIUM 9.5  --  8.6* 8.8*  MG  --  1.9  --  1.6*   GFR: Estimated Creatinine Clearance: 35.8 mL/min (by C-G formula based on Cr of 1.7). Liver Function Tests:  Recent Labs Lab 05/02/15 0418 05/03/15 0654  AST 22 14*  ALT 15* 15*  ALKPHOS 55 56  BILITOT 0.7 0.3  PROT 6.1* 5.8*  ALBUMIN 2.7* 2.4*   Coagulation Profile:  Recent Labs Lab 05/01/15 1841 05/03/15 0654  INR 1.30 1.24   Thyroid Function Tests:  Recent Labs  05/01/15 1840  TSH 0.504   Urine analysis:    Component  Value Date/Time   COLORURINE RED* 05/01/2015 1715   APPEARANCEUR TURBID* 05/01/2015 1715   LABSPEC 1.017 05/01/2015 1715   PHURINE 5.5 05/01/2015 1715   GLUCOSEU NEGATIVE 05/01/2015 1715   HGBUR LARGE* 05/01/2015 1715   BILIRUBINUR NEGATIVE 05/01/2015 1715   KETONESUR 15* 05/01/2015 1715   PROTEINUR >300* 05/01/2015 1715   UROBILINOGEN 0.2 10/01/2014 2303   NITRITE NEGATIVE 05/01/2015 1715   LEUKOCYTESUR LARGE* 05/01/2015 1715    Recent Results (from the past 240 hour(s))  Urine culture     Status: None   Collection Time: 05/01/15  5:36 PM  Result Value Ref Range Status   Specimen Description URINE, CATHETERIZED  Final   Special Requests NONE  Final   Culture MULTIPLE SPECIES PRESENT, SUGGEST RECOLLECTION  Final   Report Status 05/03/2015 FINAL  Final  Culture, blood (Routine X 2) w Reflex to ID Panel     Status: None (Preliminary result)   Collection Time: 05/01/15  6:15 PM  Result Value Ref Range Status   Specimen Description BLOOD RIGHT ANTECUBITAL  Final   Special Requests BOTTLES DRAWN AEROBIC AND ANAEROBIC 5CC  Final   Culture NO GROWTH 2 DAYS  Final   Report Status PENDING  Incomplete  Culture, blood (Routine X 2) w Reflex to ID Panel     Status: None (Preliminary result)   Collection Time: 05/01/15  6:23 PM  Result Value Ref Range Status   Specimen Description BLOOD RIGHT WRIST  Final   Special Requests BOTTLES DRAWN AEROBIC AND ANAEROBIC 5CC  Final   Culture NO GROWTH 2 DAYS  Final   Report Status PENDING  Incomplete  Surgical pcr screen     Status: Abnormal   Collection Time: 05/03/15  6:50 AM  Result Value Ref Range Status   MRSA, PCR NEGATIVE NEGATIVE Final   Staphylococcus aureus POSITIVE (A) NEGATIVE Final    Comment:        The Xpert SA Assay (FDA approved for NASAL specimens in patients over 21 years of age), is one component of a comprehensive surveillance program.  Test performance has been validated by [facility] for patients greater than or  equal to 53 year old. It is not intended to diagnose infection nor to guide or monitor treatment.       Radiology Studies: Dg Chest Port 1 View  05/01/2015  CLINICAL DATA:  Syncopal episode and weakness EXAM: PORTABLE CHEST 1 VIEW COMPARISON:  07/21/2014 FINDINGS: Cardiac shadow is within normal limits. A pacing device is again seen and stable. The lungs are clear. No acute bony abnormality is noted. Calcified granuloma is again noted in the left lung base medially. IMPRESSION: No acute abnormality noted. Electronically  Signed   By: Inez Catalina M.D.   On: 05/01/2015 18:09     Medications:  Scheduled: . sodium chloride   Intravenous Once  . acidophilus  1 capsule Oral Daily  . donepezil  10 mg Oral QHS  . feeding supplement (ENSURE ENLIVE)  237 mL Oral BID BM  . furosemide  20 mg Intravenous Once  . furosemide  20 mg Intravenous Once  . Gerhardt's butt cream   Topical Daily  . heparin  5,000 Units Subcutaneous 3 times per day  . levothyroxine  137 mcg Oral QAC breakfast  . magnesium oxide  400 mg Oral Daily  . mirabegron ER  25 mg Oral Daily  . pramipexole  0.25 mg Oral QHS  . propafenone  150 mg Oral Daily  . senna  1 tablet Oral BID  . sodium chloride flush  3 mL Intravenous Q12H   Continuous:  [id number] **OR** acetaminophen, levalbuterol, tiZANidine, zolpidem  Assessment/Plan:  Principal Problem:   Sepsis ([place]) Active Problems:   ATRIAL FIBRILLATION-paroxysmal   Pacemaker-dual-chamber-[employer]   Prostate cancer   Anemia in chronic kidney disease (CKD)   Falls   Weakness   Anemia, iron deficiency   Syncope   Pressure ulcer   UTI (urinary tract infection)   CKD (chronic kidney disease) stage 3, GFR 30-59 ml/min   Palliative care encounter    Sepsis ruled out On admission patient had leukocytosis of 22 normal lactic acid level, bradycardia but no fever with evidence of pyuria. Patient admitted with suspected diagnosis of sepsis. Patient has  chronic indwelling Foley catheter as well as chronic kidney disease likely from prostate cancer. Prior culture has grown enterococcus pansensitive. No growth noted on urine or blood cultures. Patient is afebrile. His WBC is normal. No evidence for systemic infection at this time. We'll discontinue vancomycin and Zosyn. Catheter exchanged on 05/02/2015.   Sinus bradycardia/Pacemaker implant with AV block/Tachybradycardia syndrome. Electrophysiology is following. Patient has a pacemaker which is nearing its end. Continue Rhythmol. Patient is agreeable to undergo battery exchange. Cardiology to discuss with patient further.  Mild dementia, gait difficulty, prior CVA, prior SDH, benign essential tremor/Physical deconditioning/Chronic bedbound state/Pressure ulcers. Patient has been primarily bedridden due to recurrent fall as well as generalized weakness. Patient has significant tremors and balance issues at his baseline as well. Was followed by neurology in the past. PT and OT consulted.  Syncope Etiology is unclear. Could have been due to a combination of the above mentioned issues. Could also have been orthostatic in a patient who spends most of this time laying down. The syncope occurred while he was sitting in his doctor's office.  History of iron deficiency anemia Patient's hemoglobin did drop significantly without any overt bleeding. Hemoglobin of 13 noted at the time of admission was likely erroneous. Could've been hemoconcentrated. His baseline is between 8 and 9. He will be transfused 2 units of blood. He required transfusion during his previous hospitalization as well.  Chronic kidney disease stage III Renal function close to baseline. Creatinine is slightly higher today compared to yesterday. Monitor urine output.  Unstageable prostate cancer with chronic indwelling Foley catheter Foley catheter exchanged on 05/02/2015.   Hypothyroidism. TSH normal. Continue Synthroid.  DVT  Prophylaxis: Subcutaneous heparin    Code Status: DO NOT RESUSCITATE  Family Communication: Discussed with the patient. No family at bedside at this time  Disposition Plan: Await further cardiology input. Home Health recommended by physical therapy.    LOS: 2 days   [facility]  [facility] Pager [phone] 05/03/2015, 1:00 PM  If 7PM-7AM, please contact night-coverage at [email or web address], password [facility]

## 2015-05-03 NOTE — Evaluation (Signed)
Physical Therapy Evaluation Patient Details Name: [name] MRN: [id number] DOB: [birth year] Today's Date: 05/03/2015   History of Present Illness  Pt admitted after syncopal episode at his PVP's office with bradycardia, weakness, UTI. PMH: CKD, prostate ca with chronic foley, tremor, CVA, SDH, mild dementia.  Clinical Impression  Patient demonstrates deficits in functional mobility as indicated below. May benefit from continued skilled PT to address deficits and decreased burden of care. Will continue to see and progress as tolerated.    Follow Up Recommendations Home health PT;Supervision/Assistance - 24 hour    Equipment Recommendations  Other (comment) (hoyer lift and air matress overlay)    Recommendations for Other Services       Precautions / Restrictions Precautions Precautions: Fall Restrictions Weight Bearing Restrictions: No      Mobility  Bed Mobility Overal bed mobility: Needs Assistance Bed Mobility: Rolling;Supine to Sit;Sit to Supine Rolling: Mod assist   Supine to sit: +2 for physical assistance;Total assist Sit to supine: +2 for physical assistance;Total assist   General bed mobility comments: Assist for all aspects, pt with increased extensor tone initially and anxiety about falling  Transfers                 General transfer comment: not performed  Ambulation/Gait                Stairs            Wheelchair Mobility    Modified Rankin (Stroke Patients Only)       Balance Overall balance assessment: Needs assistance Sitting-balance support: Feet supported Sitting balance-Leahy Scale: Poor Sitting balance - Comments: max initially, progressed to min, increased extensor tone when using UEs to blow his nose Postural control: Left lateral lean;Posterior lean                                   Pertinent Vitals/Pain Pain Assessment: No/denies pain    Home Living Family/patient expects to be discharged  to:: Private residence Living Arrangements: Spouse/significant other Available Help at Discharge: Family Type of Home:  (condo) Home Access: Ramped entrance     Home Layout: One level Home Equipment: Environmental consultant - 2 wheels;Wheelchair - [employer];Toilet riser;Hospital bed      Prior Function Level of Independence: Needs assistance   Gait / Transfers Assistance Needed: has not ambulated since July 2016, gets up to chair one time per week with PT and tolerates x 1 hour  ADL's / Homemaking Assistance Needed: total assist for sponge bathing, dressing, sometimes needs help with eating depending on tremor, performs grooming with set up at bed level        Hand Dominance   Dominant Hand: Left    Extremity/Trunk Assessment   Upper Extremity Assessment: Generalized weakness (tremor)           Lower Extremity Assessment: Defer to PT evaluation         Communication   Communication: No difficulties  Cognition Arousal/Alertness: Awake/alert Behavior During Therapy: Anxious (when sitting up) Overall Cognitive Status: History of cognitive impairments - at baseline (pt with baseline mild dementia)                      General Comments      Exercises        Assessment/Plan    PT Assessment Patient needs continued PT services  PT Diagnosis Generalized weakness   PT  Problem List Decreased activity tolerance;Decreased strength;Decreased balance;Cardiopulmonary status limiting activity;Pain;Decreased skin integrity  PT Treatment Interventions Functional mobility training;Therapeutic activities;Therapeutic exercise;Balance training;Patient/family education   PT Goals (Current goals can be found in the Care Plan section) Acute Rehab PT Goals Patient Stated Goal: return home PT Goal Formulation: With patient/family Time For Goal Achievement: 05/17/15 Potential to Achieve Goals: Fair    Frequency Min 2X/week   Barriers to discharge        Co-evaluation  PT/OT/SLP Co-Evaluation/Treatment: Yes Reason for Co-Treatment: For patient/therapist safety PT goals addressed during session: Mobility/safety with mobility;Balance OT goals addressed during session: ADL's and self-care       End of Session Equipment Utilized During Treatment: Gait belt Activity Tolerance: Patient limited by fatigue Patient left: in bed;with call bell/phone within reach;with bed alarm set;with family/visitor present Nurse Communication: Mobility status         Time: [id number] PT Time Calculation (min) (ACUTE ONLY): 30 min   Charges:   PT Evaluation $PT Eval Moderate Complexity: 1 Procedure     PT G CodesDuncan Dull Jun 01, 2015, 12:06 PM Alben Deeds, [place] DPT  [phone]

## 2015-05-03 NOTE — Progress Notes (Signed)
Initial Nutrition Assessment  DOCUMENTATION CODES:   Not applicable  INTERVENTION:    Ensure Enlive po TID, each supplement provides 350 kcal and 20 grams of protein  NUTRITION DIAGNOSIS:   Increased nutrient needs related to wound healing as evidenced by estimated needs.  GOAL:   Patient will meet greater than or equal to 90% of their needs  MONITOR:   PO intake, Supplement acceptance, Labs  REASON FOR ASSESSMENT:   Malnutrition Screening Tool    ASSESSMENT:   82 year old Caucasian male with a past medical history of atrial fibrillation, not on anticoagulation, history of atrial flutter, cardiac pacemaker placement, history of subdural hematoma, prostate cancer, presented to the emergency department after a syncopal episode at his PCP office.   Patient and his daughters report that he eats very well at home. He has lost a few pounds over the past few weeks-months. Usual weight 170 lbs, down to 156 lbs on admission, now up to 163 lbs. Daughter thinks the bed scale was not zeroed on admission, and his weight could be off. Nutrition-Focused physical exam completed. Findings are no fat depletion, severe muscle depletion, and no edema. Patient ate approximately half of his lunch today per daughter. He likes Ensure, drinks it at home. No plans to replace pacemaker at this time. Plans for d/c to SNF for rehab when medically ready.   Diet Order:  Diet Heart Room service appropriate?: Yes; Fluid consistency:: Thin  Skin:  Wound (see comment) (stage II to buttocks)  Last BM:  4/17  Height:   Ht Readings from Last 1 Encounters:  05/01/15 6' (1.829 m)    Weight:   Wt Readings from Last 1 Encounters:  05/03/15 163 lb 9.3 oz (74.2 kg)    Ideal Body Weight:  80.9 kg  BMI:  Body mass index is 22.18 kg/(m^2).  Estimated Nutritional Needs:   Kcal:  1900-2100  Protein:  100-115 gm  Fluid:  2 L  EDUCATION NEEDS:   No education needs identified at this time  Molli Barrows, [place], [place], [place] Pager [phone] After Hours Pager [phone]

## 2015-05-04 DIAGNOSIS — R001 Bradycardia, unspecified: Secondary | ICD-10-CM | POA: Insufficient documentation

## 2015-05-04 LAB — CBC
HCT: 33.3 % — ABNORMAL LOW (ref 39.0–52.0)
Hemoglobin: 11.1 g/dL — ABNORMAL LOW (ref 13.0–17.0)
MCH: 28.5 pg (ref 26.0–34.0)
MCHC: 33.3 g/dL (ref 30.0–36.0)
MCV: 85.4 fL (ref 78.0–100.0)
PLATELETS: 286 10*3/uL (ref 150–400)
RBC: 3.9 MIL/uL — ABNORMAL LOW (ref 4.22–5.81)
RDW: 15.5 % (ref 11.5–15.5)
WBC: 7.4 10*3/uL (ref 4.0–10.5)

## 2015-05-04 LAB — BASIC METABOLIC PANEL
Anion gap: 12 (ref 5–15)
BUN: 25 mg/dL — ABNORMAL HIGH (ref 6–20)
CALCIUM: 9.5 mg/dL (ref 8.9–10.3)
CO2: 20 mmol/L — ABNORMAL LOW (ref 22–32)
Chloride: 106 mmol/L (ref 101–111)
Creatinine, Ser: 1.47 mg/dL — ABNORMAL HIGH (ref 0.61–1.24)
GFR calc Af Amer: 50 mL/min — ABNORMAL LOW (ref >60)
GFR, EST NON AFRICAN AMERICAN: 43 mL/min — AB (ref >60)
GLUCOSE: 103 mg/dL — AB (ref 65–99)
POTASSIUM: 3.7 mmol/L (ref 3.5–5.1)
Sodium: 138 mmol/L (ref 135–145)

## 2015-05-04 MED ORDER — RISAQUAD PO CAPS: 1.0000 | ORAL_CAPSULE | Freq: Every day | ORAL | Status: DC

## 2015-05-04 MED ORDER — HYDROCODONE-ACETAMINOPHEN 5-325 MG PO TABS: 1.0000 | ORAL_TABLET | Freq: Four times a day (QID) | ORAL | Status: DC | PRN

## 2015-05-04 MED ORDER — MAGNESIUM OXIDE 400 (241.3 MG) MG PO TABS: 400.0000 mg | ORAL_TABLET | Freq: Every day | ORAL | Status: DC

## 2015-05-04 NOTE — Discharge Summary (Addendum)
[facility]  Physician Discharge Summary   Patient ID: [name] MRN: [id number] DOB/AGE: [birth year] 80 y.o.  Admit date: 05/01/2015 Discharge date: 05/04/2015  PCP: [name], MD  DISCHARGE DIAGNOSES:  Principal Problem:   Sepsis [facility]) Active Problems:   ATRIAL FIBRILLATION-paroxysmal   Pacemaker-dual-chamber-[employer]   Prostate cancer   Anemia in chronic kidney disease (CKD)   Falls   Weakness   Anemia, iron deficiency   Syncope   Pressure ulcer   UTI (urinary tract infection)   CKD (chronic kidney disease) stage 3, GFR 30-59 ml/min   Palliative care encounter   RECOMMENDATIONS FOR OUTPATIENT FOLLOW UP: 1. Thigh-high compression stockings while being mobilized 2. CBC and basic metabolic panel in 1 week 3. Patient has chronic indwelling Foley catheter which will need care.    DISCHARGE CONDITION: fair  Diet recommendation: Heart healthy  Filed Weights   05/02/15 0426 05/03/15 0500 05/04/15 0505  Weight: 72.8 kg (160 lb 7.9 oz) 74.2 kg (163 lb 9.3 oz) 72.53 kg (159 lb 14.4 oz)    INITIAL HISTORY: 82 year old Caucasian male with a past medical history of atrial fibrillation, not on anticoagulation, history of atrial flutter, cardiac pacemaker placement, history of subdural hematoma, prostate cancer, presented to the emergency department after a syncopal episode at his PCP office. Patient also has a history of neurological decline thought to be due to a consultation of prior subdural hematoma, stroke and dementia along with neuropathy.  Consultations:  Cardiology  Procedures:  None  HOSPITAL COURSE:   Sepsis, ruled out On admission patient had leukocytosis of 22 normal lactic acid level, bradycardia but no fever with evidence of pyuria. Patient admitted with suspected diagnosis of sepsis. Patient has chronic indwelling Foley catheter as well as chronic kidney disease likely from prostate cancer. Prior culture has grown  enterococcus pansensitive. No growth noted on urine or blood cultures. Patient is afebrile. His WBC is normal. No evidence for systemic infection at this time. Vancomycin and Zosyn was initially started but subsequently discontinued. Foley Catheter exchanged on 05/02/2015.   Sinus bradycardia/Pacemaker implant with AV block/Tachybradycardia syndrome/history of atrial fibrillation on anticoagulation. Patient was seen by cardiology and electrophysiologist. Patient has a pacemaker which is nearing its end. Discussions held with the patient and family. Patient and family have elected not to pursue battery exchange of the pacemaker for now. Propranolol was discontinued due to bradycardia. Continue Rythmol. Remains stable.  Mild dementia, gait difficulty, prior CVA, prior SDH, benign essential tremor/Physical deconditioning/Chronic bedbound state/Pressure ulcers. Patient has been primarily bedridden due to recurrent fall as well as generalized weakness. Patient has significant tremors and balance issues at his baseline as well. Was followed by neurology in the past. PT and OT consulted and they recommended SNF.  Syncope Etiology is unclear. Could have been due to a combination of the above mentioned issues. Could also have been orthostatic in a patient who spends most of this time laying down. The syncope occurred while he was sitting in his doctor's office. Could try thigh-high compression stockings in he is mobilized.  History of iron deficiency anemia Patient's hemoglobin did drop significantly without any overt bleeding. Hemoglobin of 13 noted at the time of admission was likely erroneous. Could've been hemoconcentrated. His baseline is between 8 and 9. He was transfused 2 units of blood. He required transfusion during his previous hospitalization as well. Hemoglobin has responded appropriately.  Chronic kidney disease stage III Renal function close to baseline. Continue to monitor as  outpatient.  Unstageable prostate  cancer with chronic indwelling Foley catheter Foley catheter exchanged on 05/02/2015. Continue Foley catheter.  Hypothyroidism. TSH normal. Continue Synthroid.  Patient was also seen by palliative medicine. He is DO NOT RESUSCITATE.  Overall, stable. Okay for discharge to SNF today.  Unfortunately, patient's insurance did not approve stay at SNF. Also discussed with Dr. Reynaldo Minium, medical director. Patient will most likely have to go home with home health.  PERTINENT LABS:  The results of significant diagnostics from this hospitalization (including imaging, microbiology, ancillary and laboratory) are listed below for reference.    Microbiology: Recent Results (from the past 240 hour(s))  Urine culture     Status: None   Collection Time: 05/01/15  5:36 PM  Result Value Ref Range Status   Specimen Description URINE, CATHETERIZED  Final   Special Requests NONE  Final   Culture MULTIPLE SPECIES PRESENT, SUGGEST RECOLLECTION  Final   Report Status 05/03/2015 FINAL  Final  Culture, blood (Routine X 2) w Reflex to ID Panel     Status: None (Preliminary result)   Collection Time: 05/01/15  6:15 PM  Result Value Ref Range Status   Specimen Description BLOOD RIGHT ANTECUBITAL  Final   Special Requests BOTTLES DRAWN AEROBIC AND ANAEROBIC 5CC  Final   Culture NO GROWTH 2 DAYS  Final   Report Status PENDING  Incomplete  Culture, blood (Routine X 2) w Reflex to ID Panel     Status: None (Preliminary result)   Collection Time: 05/01/15  6:23 PM  Result Value Ref Range Status   Specimen Description BLOOD RIGHT WRIST  Final   Special Requests BOTTLES DRAWN AEROBIC AND ANAEROBIC 5CC  Final   Culture NO GROWTH 2 DAYS  Final   Report Status PENDING  Incomplete  Surgical pcr screen     Status: Abnormal   Collection Time: 05/03/15  6:50 AM  Result Value Ref Range Status   MRSA, PCR NEGATIVE NEGATIVE Final   Staphylococcus aureus POSITIVE (A) NEGATIVE Final     Comment:        The Xpert SA Assay (FDA approved for NASAL specimens in patients over 51 years of age), is one component of a comprehensive surveillance program.  Test performance has been validated by [facility] for patients greater than or equal to 30 year old. It is not intended to diagnose infection nor to guide or monitor treatment.      Labs: Basic Metabolic Panel:  Recent Labs Lab 05/01/15 1619 05/01/15 1841 05/02/15 0418 05/03/15 0654 05/04/15 0725  NA 137  --  140 137 138  K 4.4  --  4.3 3.8 3.7  CL 108  --  115* 109 106  CO2 18*  --  14* 20* 20*  GLUCOSE 110*  --  85 107* 103*  BUN 34*  --  31* 26* 25*  CREATININE 1.58* 1.68* 1.54* 1.70* 1.47*  CALCIUM 9.5  --  8.6* 8.8* 9.5  MG  --  1.9  --  1.6*  --    Liver Function Tests:  Recent Labs Lab 05/02/15 0418 05/03/15 0654  AST 22 14*  ALT 15* 15*  ALKPHOS 55 56  BILITOT 0.7 0.3  PROT 6.1* 5.8*  ALBUMIN 2.7* 2.4*   CBC:  Recent Labs Lab 05/01/15 1619 05/02/15 0418 05/03/15 0654 05/04/15 0725  WBC 22.3* 9.1 7.3 7.4  NEUTROABS 8.5*  --  5.1  --   HGB 13.0 8.6* 7.1* 11.1*  HCT 39.2 27.4* 21.6* 33.3*  MCV 92.7 87.0 85.4 85.4  PLT 219 255 243 286     IMAGING STUDIES Dg Chest Port 1 View  05/01/2015  CLINICAL DATA:  Syncopal episode and weakness EXAM: PORTABLE CHEST 1 VIEW COMPARISON:  07/21/2014 FINDINGS: Cardiac shadow is within normal limits. A pacing device is again seen and stable. The lungs are clear. No acute bony abnormality is noted. Calcified granuloma is again noted in the left lung base medially. IMPRESSION: No acute abnormality noted. Electronically Signed   By: Inez Catalina M.D.   On: 05/01/2015 18:09    DISCHARGE EXAMINATION: Filed Vitals:   05/04/15 0140 05/04/15 0209 05/04/15 0228 05/04/15 0505  BP: 127/67 137/63 121/60 138/64  Pulse: 68 59 64 64  Temp: 98.5 F (36.9 C) 98.8 F (37.1 C) 99 F (37.2 C) 98.9 F (37.2 C)  TempSrc: Oral Oral Oral Oral  Resp: 17 19 21 18    Height:      Weight:    72.53 kg (159 lb 14.4 oz)  SpO2: 100% 97% 97% 96%   General appearance: alert, cooperative, appears stated age and no distress Resp: clear to auscultation bilaterally Cardio: regular rate and rhythm, S1, S2 normal, no murmur, click, rub or gallop GI: soft, non-tender; bowel sounds normal; no masses,  no organomegaly  DISPOSITION: SNF  Discharge Instructions    Call MD for:  difficulty breathing, headache or visual disturbances    Complete by:  As directed      Call MD for:  extreme fatigue    Complete by:  As directed      Call MD for:  persistant dizziness or light-headedness    Complete by:  As directed      Call MD for:  persistant nausea and vomiting    Complete by:  As directed      Call MD for:  severe uncontrolled pain    Complete by:  As directed      Call MD for:  temperature >100.4    Complete by:  As directed      Diet - low sodium heart healthy    Complete by:  As directed      Discharge instructions    Complete by:  As directed   While getting up from lying or sitting down position, please do so slowly to avoid passing out episodes. Can consider wearing thigh-high compression stockings. CBC and basic metabolic panel in 1 week.  You were cared for by a hospitalist during your hospital stay. If you have any questions about your discharge medications or the care you received while you were in the hospital after you are discharged, you can call the unit and asked to speak with the hospitalist on call if the hospitalist that took care of you is not available. Once you are discharged, your primary care physician will handle any further medical issues. Please note that NO REFILLS for any discharge medications will be authorized once you are discharged, as it is imperative that you return to your primary care physician (or establish a relationship with a primary care physician if you do not have one) for your aftercare needs so that they can reassess your  need for medications and monitor your lab values. If you do not have a primary care physician, you can call [phone] for a physician referral.     Increase activity slowly    Complete by:  As directed            ALLERGIES: No Known Allergies    Current Discharge Medication List  START taking these medications   Details  magnesium oxide (MAG-OX) 400 (241.3 Mg) MG tablet Take 1 tablet (400 mg total) by mouth daily. For 3 weeks Qty: 21 tablet, Refills: 0      CONTINUE these medications which have CHANGED   Details  HYDROcodone-acetaminophen (NORCO/VICODIN) 5-325 MG tablet Take 1 tablet by mouth every 6 (six) hours as needed for moderate pain. Qty: 30 tablet, Refills: 0      CONTINUE these medications which have NOT CHANGED   Details  aspirin EC 81 MG tablet Take 81 mg by mouth daily.    donepezil (ARICEPT) 10 MG tablet Take 10 mg by mouth at bedtime.    levothyroxine (SYNTHROID, LEVOTHROID) 137 MCG tablet Take 137 mcg by mouth daily before breakfast.    mirabegron ER (MYRBETRIQ) 25 MG TB24 tablet Take 25 mg by mouth daily.    Multiple Vitamin (MULTIVITAMIN WITH MINERALS) TABS tablet Take 1 tablet by mouth daily.    pramipexole (MIRAPEX) 0.25 MG tablet Take 0.25 mg by mouth at bedtime. Refills: 9    Probiotic Product (PROBIOTIC PO) Take 1 tablet by mouth daily.    propafenone (RYTHMOL) 150 MG tablet Take 150 mg by mouth daily.    tiZANidine (ZANAFLEX) 2 MG tablet Take 2 mg by mouth every 8 (eight) hours as needed for muscle spasms.    megestrol (MEGACE) 20 MG tablet Take 20 mg by mouth 2 (two) times daily.     zolpidem (AMBIEN) 10 MG tablet Take 10 mg by mouth at bedtime as needed for sleep.  Refills: 1   Associated Diagnoses: Anemia in chronic renal disease      STOP taking these medications     propranolol ER (INDERAL LA) 60 MG 24 hr capsule        Follow-up Information    Follow up with [name], MD. Schedule an appointment as soon as possible  for a visit in 1 week.   Specialty:  Internal Medicine   Why:  post hospitalization follow up   Contact information:   [address] [place] [address] [phone]       TOTAL DISCHARGE TIME: 35 mins  [facility] Pager [phone]  05/04/2015, 11:45 AM

## 2015-05-04 NOTE — Progress Notes (Signed)
CSW spoke with patient's daughter and wife to explain Medical Director's recommendation that patient discharge home today, since insurance was unable to approve SNF stay. They expressed understanding and patient's wife stated, "I am glad, I want him to come home. There is nothing they can do for him at SNF."  RNCM to complete PTAR form.  CSW signing off.  Percell Locus Anaya Bovee LCSWA [phone]

## 2015-05-04 NOTE — Discharge Instructions (Signed)

## 2015-05-04 NOTE — Consult Note (Signed)
   [facility] CM Inpatient Consult   05/04/2015  [name] 03/30/1931 [id number] Consult received for this patient with [employer]. Chart review reveals patient is awaiting placement for a skilled facility and also reviewed Palliative Care notes.  Called placed to [facility] for follow up as this patient has a discharge order. This Probation officer was able to leave a voice mail message for Utilization Review Freight forwarder, requesting a return call.   Natividad Brood, RN BSN [facility] Liaison  [phone] business mobile phone Toll free office [phone]

## 2015-05-04 NOTE — Progress Notes (Signed)
Tel reviewed  No HR < 50 VS stable Pt asleep   Continue current cardiovascular recs--no pacing replacement

## 2015-05-04 NOTE — Care Management Important Message (Signed)
Important Message  Patient Details  Name: NAMARI DOSWELL MRN: [id number] Date of Birth: 11-Mar-1931   Medicare Important Message Given:  Yes    Barb Merino Rayfield Beem 05/04/2015, 4:13 PM

## 2015-05-05 LAB — TYPE AND SCREEN
ABO/RH(D): O POS
Antibody Screen: POSITIVE
DAT, IgG: NEGATIVE
UNIT DIVISION: 0
Unit division: 0

## 2015-05-06 LAB — CULTURE, BLOOD (ROUTINE X 2)
CULTURE: NO GROWTH
CULTURE: NO GROWTH

## 2015-06-05 ENCOUNTER — Other Ambulatory Visit: Payer: Self-pay

## 2015-06-05 NOTE — Patient Outreach (Signed)
[facility]) Care Management  06/05/2015  [name] 06-Feb-1931 [id number]   Telephone Screen  Referral Date:  05/30/15 Source:  [facility] Issue:  Primary Contact:  Wife, [name].  Patient consent to speak with wife: yes Please assist member with palliative care options including but not limited to:  MD that can visit the home (Member is bedbound, working with [facility] PT but patient can't walk.  Patient is unable to leave the home to attend appt's.  Wife is going to look into options for reclining W/C.  Recent hospitalization:  Yes.   Insurance:  [employer] PCP:  Dr. Lajean Manes   Outreach call #1 to patient.  Patient reached but states wife is not at home at this time.  Patient gives verbal consent to speak with wife regarding his PHI and agreed to call back tomorrow to speak with wife.    Mariann Laster, RN, BSN, [facility], CCM  [facility] [employer] Management Coordinator [phone] Direct [phone] Cell [phone] Office [phone] Fax

## 2015-06-06 ENCOUNTER — Other Ambulatory Visit: Payer: Self-pay

## 2015-06-06 NOTE — Patient Outreach (Signed)
[facility] Care Management  06/06/2015  [name] 1931/12/28 [id number]   Care Coordination   Inbound call received from [facility], [facility] CM.  States providing update on services.  States closing out case to Transition of Care services with [facility].   States patient / wife contacted MD regarding reclining W/C and was advised that patient will need to come in for appt.  States patient is homebound and a visit to the MD is too difficult. .  Plan:  RN CM will discuss issue with caregiver/wife and patient on Screening call scheduled for today.   Mariann Laster, RN, BSN, [facility], CCM  [facility] [employer] Management Coordinator [phone] Direct [phone] Cell [phone] Office [phone] Fax

## 2015-06-06 NOTE — Patient Outreach (Signed)
[facility]) Care Management  06/06/2015  [name] 16-Sep-1931 [id number]   Telephone Screen  Referral Date: 05/30/15 Source: [facility] Issue: Primary Contact: Wife, [name]. Patient consent to speak with wife: yes Please assist member with palliative care options including but not limited to: MD that can visit the home (Member is bedbound, working with [facility] PT but patient can't walk. Patient is unable to leave the home to attend appt's. Wife is going to look into options for reclining W/C. Recent hospitalization: Yes.  Insurance: [employer] PCP: Dr. Lajean Manes  H/o Inbound call received from [facility], [facility] CM.  States providing update on services. States closing out case to Transition of Care services with [facility].  States patient / wife contacted MD regarding reclining W/C and was advised that patient will need to come in for appt. States patient is homebound and a visit to the MD is too difficult. .  Outreach call #2 to patient.  Patient reached but states wife is not at home at this time.  H/o Patient provided verbal consent to speak with wife regarding his PHI and agreed to call back tomorrow to speak with wife. RN CM left name and number for call back.  RN CM advised in next contact call within one week.    Mariann Laster, RN, BSN, [facility], CCM  [facility] [employer] Management Coordinator [phone] Direct [phone] Cell [phone] Office [phone] Fax

## 2015-06-07 ENCOUNTER — Other Ambulatory Visit: Payer: Self-pay

## 2015-06-07 DIAGNOSIS — I480 Paroxysmal atrial fibrillation: Secondary | ICD-10-CM

## 2015-06-07 DIAGNOSIS — Z9181 History of falling: Secondary | ICD-10-CM

## 2015-06-07 NOTE — Patient Outreach (Addendum)
[facility]) Care Management  06/07/2015  ARGIL HANDLEY [birth year] [id number]  Best time there during day. Before lunch.    Referral Date: 05/30/15 Source: [facility] Issue: Primary Contact: Wife, [name]. Patient consent to speak with wife: yes Please assist member with palliative care options including but not limited to: MD that can visit the home (Member is bedbound, working with [facility] PT but patient can't walk. Patient is unable to leave the home to attend appointments. Wife is going to look into options for reclining W/C. Recent hospitalization: Yes.  H/o Inbound call received from [employer], [facility] CM. States providing update on services.  States closing out case to Transition of Care services with [facility]. Wife contacted MD regarding reclining W/C and was advised that patient will need to come in for appt. States patient is homebound and a visit to the MD is too difficult.   Subjective: Wife states she is able to provide care to patient even though she gets tired.  States the most stressful and confusing part is trying to coordinate care and get things patient needs and understand all the paperwork, billing and understanding benefit coverage or services patient may need.   Providers: Primary MD:  Dr. Lajean Manes  -  last appt: 05/01/15    next appt: scheduled for W/C need evaluation. HH:   [facility]   [phone]  RN, PT services [employer] PRN when wife needs additional help.  Private pay and wife stated she was not aware of [place] CNA option.   Insurance:  HTA   - Issue:  Wife states she lacks knowledge of insurance coverage and benefits.  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  States patient has been active with [facility] services since mid August 2016.  States she received 2 bills from [facility] for January and February 2017.  Wife is not sure if she is in network with  [facility] or if this is for a deductible or co-pays.   Wife states no one explained fees or cost on initial start up of services.   Social: 82 yo homebound / bedbound patient; lives in his home with caregiver/wife/Margaret.    Wife states plan to keep patient in the home for LTC if at all possible.  Patient no longer getting out of bed to [facility].   Mobility: Homebound/bedbound; difficulty with W/C mobility at this time; needs a reclining back to manage patients needs.  Falls: yes over year but none now that patient is no longer getting up out of bed.  Pain: none  Depression: none Transportation: wife has been managing in the past.  Further assessment needed.  Caregiver: wife, [name] Advance Directive: yes;  MD has copy.  Resources: -LTC insurance:  None -Veteran: yes but states daughter checked on eligibility for services summer 2016 and did not find the services provided to be of any help.   Consent:  Yes:  H/o Patient provided verbal consent to speak with wife regarding his PHI and agreed to call back tomorrow to speak with wife.  DME: W/C, hospital bed, [facility] (no longer uses)   Multiple Co-morbidities ER visits: 3 / Admissions: 2 Last admission:  05/01/15 - 05/04/15  Sepsis  Wife states last appt with Dr. Felipa Eth was 05/01/15.  Patient went via W/C with the help of daughter and passed out in the office.  Dr. Felipa Eth admitted patient.  Patient was discharged to [facility].  PMH:  ATRIAL FIBRILLATION-paroxysmal  Pacemaker-dual-chamber-[place]  Scientific  Prostate cancer  Anemia in chronic kidney disease (CKD)  Falls  Weakness  Anemia, iron deficiency  Syncope  Pressure ulcer  UTI (urinary tract infection)  CKD (chronic kidney disease) stage 3  Palliative care encounter  Palliative Care Home Consult Visit (03/2015) Contact:  Wynell Balloon  [phone]   Wife states she received a bill for $200.00 and lacks understanding if this is a bill for that one visit only or for  a continued services.  States she has not received any further visits since the initial visit.   States Ms. Coady recommended and provided prescription  for muscle relaxer for patient.   Medications:  Patient taking less than 15 medications  Co-pay cost issues: none Flu Vaccine: 12/30/14 Pneumonia Vaccine:  03/15/1998 and PCV13:  04/01/2013 Wife confirms no questions regarding current medications.   Objective:   Encounter Medications:  Outpatient Encounter Prescriptions as of 06/07/2015  Medication Sig Note  . aspirin EC 81 MG tablet Take 81 mg by mouth daily.   Marland Kitchen donepezil (ARICEPT) 10 MG tablet Take 10 mg by mouth at bedtime.   Marland Kitchen HYDROcodone-acetaminophen (NORCO/VICODIN) 5-325 MG tablet Take 1 tablet by mouth every 6 (six) hours as needed for moderate pain.   Marland Kitchen levothyroxine (SYNTHROID, LEVOTHROID) 137 MCG tablet Take 137 mcg by mouth daily before breakfast.   . magnesium oxide (MAG-OX) 400 (241.3 Mg) MG tablet Take 1 tablet (400 mg total) by mouth daily. For 3 weeks   . megestrol (MEGACE) 20 MG tablet Take 20 mg by mouth 2 (two) times daily.  10/01/2014: .  . mirabegron ER (MYRBETRIQ) 25 MG TB24 tablet Take 25 mg by mouth daily.   . Multiple Vitamin (MULTIVITAMIN WITH MINERALS) TABS tablet Take 1 tablet by mouth daily.   . pramipexole (MIRAPEX) 0.25 MG tablet Take 0.25 mg by mouth at bedtime.   . Probiotic Product (PROBIOTIC PO) Take 1 tablet by mouth daily.   . propafenone (RYTHMOL) 150 MG tablet Take 150 mg by mouth daily.   Marland Kitchen tiZANidine (ZANAFLEX) 2 MG tablet Take 2 mg by mouth every 8 (eight) hours as needed for muscle spasms.   Marland Kitchen zolpidem (AMBIEN) 10 MG tablet Take 10 mg by mouth at bedtime as needed for sleep.  06/15/2014: .    No facility-administered encounter medications on file as of 06/07/2015.    Functional Status:  In your present state of health, do you have any difficulty performing the following activities: 06/07/2015 05/01/2015  Hearing? N N  Vision? N N  Difficulty  concentrating or making decisions? N Y  Walking or climbing stairs? Y Y  Dressing or bathing? Y Y  Doing errands, shopping? [place]  Preparing Food and eating ? Y -  Using the Toilet? Y -  In the past six months, have you accidently leaked urine? N -  Do you have problems with loss of bowel control? N -  Managing your Medications? Y -  Managing your Finances? Y -  Housekeeping or managing your Housekeeping? Y -    Fall/Depression Screening: PHQ 2/9 Scores 06/07/2015 01/27/2014 01/27/2014  PHQ - 2 Score 0 0 0   Fall Risk  06/07/2015 12/20/2014 01/27/2014 01/27/2014  Falls in the past year? Yes No No No  Number falls in past yr: 2 or more - - -  Injury with Fall? No - - -  Risk Factor Category  High Fall Risk - - -  Risk for fall due to : History of fall(s);Impaired balance/gait;Impaired mobility - - -  Follow up Falls evaluation completed;Education provided;Falls prevention discussed - - -   Assessment / Plan:  Referral Date: 05/30/15 Screening and Initial Assessment:  06/07/2015 Telephonic RN CM Date:  06/07/2015  Program:  Other A-Fib Interim THN Telephonic RN CM Referral to cover until [place] start.   Physicians Home Visits RN CM provided education on option of home visiting MD.  Wife aware that she would change from Dr. Felipa Eth to this new provider and while this is not favored; wife understands that to keep things the same is not an option unless patient goes by pre-arranged ambulance or modified W/C.   Wife states goal is to provide LTC in the home rather than facility care.   RN CM mailed information to wife for review.   Provider:  [facility]) - [email or web address]                                                                                                                    DME:  W/C Transportation and appointment compliance associated to Homebound status.  Patient unable to sit  up in W/C.  Patient has office appointment with Dr. Felipa Eth to evaluate for W/C; however, wife does not know how she will get patient to this appointment.  RN CM provided education on need for documented MD note to indicate evaluation for W/C.  RN CM advised MD will then send order to provider RN CM advised that provider will assess patient and specific W/C needs.  RN CM will follow-up to determine if wife is able to get patient into the W/C alone or if other DME / Assistance needed.  RN CM will follow-up with Dr. Felipa Eth to verify appt date and notify MD of difficulty getting patient to this appt.   HH Services:  CNA  RN CM provided education on CNA to assist with bathing and advised eligible as long as RN or PT services active; but CNA service will stop whenever last professional discipline leaves the home.  RN CM mailed Medicare.gov -  HH benefit information.  RN CM will contact Dr. Felipa Eth to request order be sent to [facility] provider for CNA.   Palliative Care: RN CM will follow-up with provider to verify services.  (Wife is not sure if this was a one time visit or continued service).   RN CM will follow-up with provider to determine if fee $200.00 was related to co-pay or was services a non-covered benefit.   Benefits and Community Resources: Caregiver lacks understanding of benefits and service options.  RN CM discussed benefits of knowing further what VETERANS Benefit that patient would be eligible for and may be helpful with homebound needs and possible PCS services.  [facility] SW Referral work referral to assist with Veterans eligibility application;  Patient in need of LTC services in the home. Wife has lack of knowledge regarding how services may be beneficial in obtaining services in the home to meet patients LTC needs.  -LTC insurance:  None -Veteran: yes.  H/o daughter checked on eligibility for services summer 2016 and did not find the services provided to be of any help. (Please  assess to make sure wife understands LOC services that may be beneficial).  [place] RN CM Referral (NP Requested) -Interim THN Telephonic RN CM to cover until [place] start due to LOC needs with care coordination at this time.   RN CM advised patient in next [facility] scheduled contact call within the next 10 business days. RN CM advised Telephonic RN CM will notify wife/caregiver of any new updates with care coordination needs until [place] CM is able to initiate services.  RN CM advised to please notify MD of any changes in condition prior to scheduled appt's.   RN CM provided contact name and # [phone] or main office # [phone] and 24-hour nurse line # [phone].  RN CM confirmed patient is aware of 911 services for urgent emergency needs.  RN CM sent successful outreach letter and  [facility] Introductory package to provide further interim clarification of services. RN CM notified [place] Management Assistant: agreed to services/case opened. RN CM sent Physician Enrollment/Barriers Letter and Initial Assessment to Primary MD    Mariann Laster, RN, BSN, [facility], [place] Management Care Management Coordinator [phone] Direct [phone] Cell [phone] Office [phone] Fax

## 2015-06-08 ENCOUNTER — Other Ambulatory Visit: Payer: Self-pay

## 2015-06-08 ENCOUNTER — Ambulatory Visit: Payer: Self-pay

## 2015-06-08 NOTE — Patient Outreach (Signed)
[facility]) Care Management  06/08/2015  [name] 1931-08-06 [id number]  Telephonic Care Coordination Services  Contact call to Palliative Care: -RN CM will follow-up with provider to verify services. (Wife is not sure if this was a one time visit or continued service).  -RN CM will follow-up with provider to determine if fee $200.00 was related to co-pay or was services a non-covered benefit.  H/o Palliative Care Home Consult Visit (03/2015) Contact: Wynell Balloon [phone]  Wife states she received a bill for $200.00 and lacks understanding if this is a bill for that one visit only or for a continued services. States she has not received any further visits since the initial visit. States Ms. Coady recommended and provided prescription for muscle relaxer for patient.    RN CM spoke with contact:  Verdis Frederickson. [id number] ext [phone]   Wynell Balloon, NP currently out of office.  RN provided the above update. RN CM confirmed provider: (as wife did not know this information):  [facility].  [phone]  RN CM confirmed Initial Palliative Care Assessment Date:  04/05/15 and patient remains active for Palliative Care Services only.  States follow-up visits are scheduled case by case need.  Plan:   RN CM requested call back from NP when available to clarify services provided under Palliative Care_; next scheduled follow-up appointment_ and discuss patient's plan of care_.  RN CM will update patient's wife/caregiver on next contact call.    Addendum:  Inbound call returned back from [place].   NP will call caregiver tomorrow,  06/09/15 to schedule visit with patient.  Mariann Laster, RN, BSN, [facility], CCM  [employer] Management Coordinator [phone] Direct [phone] Cell [phone] Office [phone] Fax

## 2015-06-08 NOTE — Patient Outreach (Addendum)
[facility]) Care Management  06/08/2015  [name] 04-Dec-1931 [id number]   Telephonic Care Coordination Service  Contact call to Dr. Felipa Eth:  Office contact [place]  (1) CNA RN CM requested order for CMA 1 time a week to meet LTC needs in the home while patient is still active with [facility] services.  Provider:  [facility]  (2) MD appt date:  RN CM notified patient is currently unable to get into MD appt's due to bedbound status.  RN CM requested date of next office appointment for W/C assessment:    Plan:  RN CM will continue to assess patient's ability to make this appt and if ambulance transportation may be needed.   [place] visit and further assessment  Pending.   Addendum:  Inbound call received from contact [place]. -MD agreed to [facility] CNA order and will fax to [facility] -Patient currently does not have any pending appt's scheduled.  Plan:   -Call back from [facility] RN / PT pending to discuss safety and need for W/C.  -RN CM will continue to assess patient's ability to make this appt and if ambulance transportation may be needed.   -[place] visit and further assessment  Pending.  -RN CM will update caregiver on next contact call.     Mariann Laster, RN, BSN, [facility], CCM  [employer] Management Coordinator [phone] Direct [phone] Cell [phone] Office [phone] Fax

## 2015-06-08 NOTE — Patient Outreach (Signed)
[facility]) Care Management  06/08/2015  [name] 27-Apr-1931 [id number]   Telephonic Care Coordination Services  Contact call to [facility] provider:  [facility] [phone]  Patient active with RN, PT services [employer] PRN when wife needs additional help. Private pay and wife stated she was not aware of [place] CNA option.  Insurance: HTA - Wife states she lacks knowledge of insurance coverage and benefits. 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 States patient has been active with [facility] services since mid August 2016. States she received 2 bills from [facility] for January and February 2017. Wife is not sure if she is in network with [facility] or if this is for a deductible or co-pays. Wife states no one explained fees or cost on initial start up of services.   RN CM spoke with contact:  Nikki.  RN CM confirmed initial [place] services initiated 07/2014 - 11/2014 and closed but reopened case 12/12/2015 - present.  RN CM confirmed last OT services 12/19/14 - 01/2015.  RN CM confirmed in-network with HTA; co-pay $25.00 per visit per discipline.   RN CM requested provider please contact patient to discuss her bill and explain charges, benefits and cost.   RN CM confirmed provider has option of CNA but temporary services only based on Medicare guidelines.  RN CM advised waiting on call back from Palliative to determine what other services they can provide but patient in need of care during the interim of reviewing service options.  Advised Dr. Felipa Eth notified of need for CNA to assist with care during this change in condition.  RN CM discussed Safety concerns with wife/caregiver transferring to W/C and requested call back from [facility] RN or PT to discuss assessment of wife's ability to perform these task safely and need for modified W/C (with back recline) and if further OT services are recommended due to patient  decline.   Plan: Update from [facility] RN/PT pending.  RN CM will provide wife update on next patient contact call.   Mariann Laster, RN, BSN, [facility], CCM  [employer] Management Coordinator [phone] Direct [phone] Cell [phone] Office [phone] Fax

## 2015-06-09 ENCOUNTER — Other Ambulatory Visit: Payer: Self-pay | Admitting: Licensed Clinical Social Worker

## 2015-06-09 ENCOUNTER — Encounter: Payer: Self-pay | Admitting: Licensed Clinical Social Worker

## 2015-06-09 NOTE — Patient Outreach (Signed)
[facility]) Care Management  06/09/2015  YARED GAETANI 11-02-1931 [id number]   Assessment- CSW received referral on 06/08/15 for veteran benefit resources. CSW completed initial outreach and patient answered. HIPPA verifications provided. Patient wishes to hand spouse the phone. Consent provided. CSW introduced self, reason for call and of THN social work services to spouse. Spouse initially states that she is unsure if she needs services from [facility] because she already has home health services. CSW spent time educating her on the difference between home health and [facility]. Family agreeable to [facility] social work services. Spouse reports an interest in gaining veteran benefits. Patient's daughter tried to assist patient with gaining benefits last year but spouse is unsure on what came of that. CSW educated her on the application for benefits process and stated that she can apply online or in person. Spouse rather apply in person. CSW educated her on veteran specialist offices in [place] and [place]. Spouse shares that she could go to offices and apply for patient. CSW will mail family locations and numbers of veteran specialist offices. Spouse is also unsure on what benefits the [place] can provide. CSW spent time educationg spouse on these. Spouse interested in the Aid and Attendance program. CSW will mail family a complete list of VA benefits. CSW will also mail her information on Aid and Attendance. Spouse appreciative of information provided.  Spouse discussed that she is unsure if patient has palliative care services. CSW informed her that [facility] is checking into this and is waiting to hear back. Spouse confused on what she got palliative care bill. Spouse states that patient is homebound and unable to leave the house. CSW educated spouse on opportunities for caregiver relief such as caregiver support groups at the adult center for enrichment and [place] in [place]. Spouse  states that family wishes for patient to stay in the home for as long as possible and does not wish to discuss long term care placement options. CSW educated patient on the [employer] and [employer] and the In [employer]. Spouse interested in both but unsure if she is already on waiting list. CSW will wait for home visit before completing referral to [facility] but will put patient on waiting list for [facility] per spouse request. Spouse agreeable to CSW mailing out transportation resources to residence as well.  CSW contacted [facility] and patient was put on CNA waiting list on 01/04/15. Patient still has several months of waiting on the wait list.  Plan-CSW will complete outreach next week to schedule home visit. CSW will send request to [place] Management Assistant to mail out information on what VA benefits are, What is Aid and Attendance, Information on [place] Specialist in [facility] and [employer].  [name], BSW, MSW, [email or web address] Phone: [phone] Fax: [phone]

## 2015-06-09 NOTE — Patient Outreach (Signed)
[place] [facility]) Care Management  06/09/2015  [name] [birth year] [id number]  [facility] Educational Materials mailed 06/09/2015 -Living With Atrial Fibrillation -Atrial Fibrillation And The Risk Of Stroke -Atrial Fibrillation Booklet  Mariann Laster, RN, BSN, [facility], [place] Management Care Management Coordinator [phone] Direct [phone] Cell [phone] Office [phone] Fax

## 2015-06-13 ENCOUNTER — Other Ambulatory Visit: Payer: Self-pay | Admitting: Licensed Clinical Social Worker

## 2015-06-13 ENCOUNTER — Other Ambulatory Visit: Payer: Self-pay | Admitting: *Deleted

## 2015-06-13 NOTE — Patient Outreach (Addendum)
[place] [facility]) Care Management  06/13/2015  [name] 1931-09-18 [id number]   Assessment- CSW completed outreach to [employer] [facility] [place] on 06/13/15 and was able to get information on program. CSW was informed that they can assist families with applying for [employer]. However, patient will need to have DD-214 (discharge papers) in order to apply. If they do not have discharge papers, then they can order the DD-214 for them which will take 3-4 weeks to get back. CSW questioned if patient is homebound and cannot leave house if spouse can apply for him. CSW was informed that spouse would have to be POA. However, they can at times do home visits with patient so that patient does not have to leave their home.  CSW contacted spouse. Spouse answered. HIPPA verifications provided. Spouse was provided updates on this program. She states that she IS the POA and would be willing to go to DSS to apply for benefits. Spouse is also agreeable to home visit tomorrow.  Plan-CSW will complete home visit tomorrow and provide social work assistance.  THN CM Care Plan Problem One        Most Recent Value   Care Plan Problem One  Lack of community resources including Veteran Benefits   Role Documenting the Problem One  Clinical Social Worker   Care Plan for Problem One  Active   THN Long Term Goal (31-90 days)  Patient and family will be educated on how to apply for [facility] benefits and will follow up with resources provided within 90 days   THN Long Term Goal Start Date  06/13/15   Interventions for Problem One Long Term Goal  CSW will complete home visit and provide education on how and where to apply for [facility] benefits and will review these resources as needed. CSW will monitor this process as well.    THN CM Short Term Goal #1 (0-30 days)  Patient and family will be educated on what Veteran benefits look like within 30 days and the various services they can provide for family    THN CM Short Term Goal #1 Start Date  06/13/15   Interventions for Short Term Goal #1  CSW will complete home visit and provide education on the services Veteran Benefits will offer. CSW will review these as needed   THN CM Short Term Goal #2 (0-30 days)  Patient and family will be educated on Development worker, community within [facility] within 30 days   [facility] CM Short Term Goal #2 Start Date  06/13/15   Interventions for Short Term Goal #2  CSW will  complete home visit and educate family on senior resources that may benefit patient's overall well being and health.       [name], BSW, MSW, [email or web address] Phone: [phone] Fax: [phone]

## 2015-06-13 NOTE — Patient Outreach (Signed)
Referral received from telephonic care manager, C. Hutchinson for community involvement.  Call placed to member in attempt to arrange an initial home visit.  Wife answers phone and verifies member's identity, stating that the member was not available.  Purpose of call explained.  She states that she is beginning to become overwhelmed with all of the calls/visits from agencies/disciplines.  Informed that this care manager is with [facility], as well as C. Hutchinson and [employer], whom she has a home visit with tomorrow.  She expresses confusion regarding the difference between [facility] (home health) and [facility], difference explained.  She state that she has also been in communication with palliative care.  She again voices the feeling of being overwhelmed and states she would prefer not to have a home visit at this time.  In effort to decrease level of anxiety and to assist with wife being overwhelmed, this care manager will contact [name] regarding progress of initial contact and other resources (palliative) to determine best level of involvement with [facility].  Wife made aware and agree with this plan.  Will follow up with wife after communication with [name], within the next week.  [name], BSN, [place] [facility]  [employer] [facility] Care Manager [phone]

## 2015-06-13 NOTE — Patient Outreach (Signed)
[place] [facility]) Care Management  06/13/2015  [name] [birth year] [id number]   Request received from [name], LCSW to mail patient community resources including veteran resources and transportation resources. Information mailed today.  Jacqulynn Cadet  [facility] Care Management Assistant

## 2015-06-14 ENCOUNTER — Other Ambulatory Visit: Payer: Self-pay | Admitting: Licensed Clinical Social Worker

## 2015-06-14 ENCOUNTER — Ambulatory Visit: Payer: Self-pay | Admitting: Licensed Clinical Social Worker

## 2015-06-14 NOTE — Patient Outreach (Signed)
[place] [facility]) Care Management  [facility] Social Work  06/14/2015  [name] 1931-08-25 [id number]   Encounter Medications:  Outpatient Encounter Prescriptions as of 06/14/2015  Medication Sig Note  . aspirin EC 81 MG tablet Take 81 mg by mouth daily.   Marland Kitchen donepezil (ARICEPT) 10 MG tablet Take 10 mg by mouth at bedtime.   Marland Kitchen HYDROcodone-acetaminophen (NORCO/VICODIN) 5-325 MG tablet Take 1 tablet by mouth every 6 (six) hours as needed for moderate pain.   Marland Kitchen levothyroxine (SYNTHROID, LEVOTHROID) 137 MCG tablet Take 137 mcg by mouth daily before breakfast.   . magnesium oxide (MAG-OX) 400 (241.3 Mg) MG tablet Take 1 tablet (400 mg total) by mouth daily. For 3 weeks   . megestrol (MEGACE) 20 MG tablet Take 20 mg by mouth 2 (two) times daily.  10/01/2014: .  . mirabegron ER (MYRBETRIQ) 25 MG TB24 tablet Take 25 mg by mouth daily.   . Multiple Vitamin (MULTIVITAMIN WITH MINERALS) TABS tablet Take 1 tablet by mouth daily.   . pramipexole (MIRAPEX) 0.25 MG tablet Take 0.25 mg by mouth at bedtime.   . Probiotic Product (PROBIOTIC PO) Take 1 tablet by mouth daily.   . propafenone (RYTHMOL) 150 MG tablet Take 150 mg by mouth daily.   Marland Kitchen tiZANidine (ZANAFLEX) 2 MG tablet Take 2 mg by mouth every 8 (eight) hours as needed for muscle spasms.   Marland Kitchen zolpidem (AMBIEN) 10 MG tablet Take 10 mg by mouth at bedtime as needed for sleep.  06/15/2014: .    No facility-administered encounter medications on file as of 06/14/2015.    Functional Status:  In your present state of health, do you have any difficulty performing the following activities: 06/07/2015 05/01/2015  Hearing? N N  Vision? N N  Difficulty concentrating or making decisions? N Y  Walking or climbing stairs? Y Y  Dressing or bathing? Y Y  Doing errands, shopping? Tempie Donning  Preparing Food and eating ? Y -  Using the Toilet? Y -  In the past six months, have you accidently leaked urine? N -  Do you have problems with loss of bowel  control? N -  Managing your Medications? Y -  Managing your Finances? Y -  Housekeeping or managing your Housekeeping? Y -    Fall/Depression Screening:  PHQ 2/9 Scores 06/14/2015 06/07/2015 01/27/2014 01/27/2014  PHQ - 2 Score 0 0 0 0    Assessment: CSW completed initial home visit on 06/14/15. CSW met with patient, spouse and daughter. Daughter is temporarily residing at residence in order to assist father. She stays for a few weeks at a time. Patient is in need of community resources. Patient reports that he is not experiencing any pain today. He reports that he has difficulty at times with sleeping and does not like taking his ambien medication. Patient is homebound and is unable to leave home in order to attend physician appointments. Patient is in need of seeing his PCP and getting a part of his wheelchair replaced (needs to see PCP for this to be ordered.) RNCM Crystal Hutchinson is looking into possibly finding a provider to visit him at home. RNCM Crystal Hutchinson is also looking into whether patient has palliative care services or not as patient are unsure. Patient's last medical appointment with in April of this year where he passed out during visit and then was admitted into the hospital for two days per spouse. Family is interested in gaining [place] benefits. CSW provided family a list of  Veteran benefits and their services as they do not know the various services that they provide.  CSW also provided education information on what Aide and Attendance is. CSW provided family a list of three options for them to apply for VA benefits. Spouse has chose [employer] as she can apply for benefits for her spouse. Spouse showed DD-214 to CSW. Spouse is agreeable to contacting them and setting up an appointment to apply for benefits. CSW also provided family with a list of personal care resources. They use [employer] at times to assist with patient's bathing but it cost $19.00 per hour. Daughter reports  that she familiar with different websites that can make it easier to find a caregiver for a cheaper amount. CSW encouraged family to network within the community. Per spouse, patient is already on [employer] and Response Program waiting list. Family denies wanting to be put on the [place] waiting list which can take up to two years. Family share that they have a friend that is willing to help patient with being transported to medical appointments but that they have to pay them. They also report knowing a contact person named Jenny Reichmann that they have used in the past to assist with this and have paid him as well. CSW provided a list of transportation resources. Family denies wanting SCAT or Armed forces technical officer.   Patient reports experiencing weakness. He has extreme difficulty doing anything other than lying in the bed. Patient is currently receiving physical therapy and nursing services but physical therapy have decreased days from two per week to one per week since daughter is now there. Family express concerns with patient's motivation to complete physical therapy and do his exercises. Patient reports occasional pain with restless leg syndrome. Family denies any financial problems. Family resides in a beautiful home and area. CSW provided family with a list of caregiver support groups within [facility]. Daughter is interested in these groups but spouse is not.    Plan: CSW will send entire encounter to PCP. CSW has left voice messages and sent in basket message to [place] requesting updates. CSW will follow up in two weeks with family to review resources and progress with VA benefits.  [name], BSW, MSW, [email or web address] Phone: [phone] Fax: [phone]

## 2015-06-16 ENCOUNTER — Other Ambulatory Visit: Payer: Self-pay | Admitting: *Deleted

## 2015-06-16 NOTE — Patient Outreach (Signed)
Call placed to [facility] to inquire about involvement with member.  Message left, will await call back.  Will also follow up with C. Hutchinson, telephonic care manager.  If member is involved, will close case.  Valente David, [place], MSN [place] [phone]

## 2015-06-19 ENCOUNTER — Other Ambulatory Visit: Payer: Self-pay

## 2015-06-19 NOTE — Patient Outreach (Signed)
[place] [facility] Care Management  06/19/2015  LARA HEARST [birth year] [id number]  Telephonic Care Coordination Services  Contact call to Dr. Court Joy, [employer] with [facility] [place].  RN CM left message requesting call back.    Contact:  Wynell Balloon, NP returned call back.  Confirmed active with Palliative Services only.  States patient is eligible to receive Palliative services along side [facility].  States patient's wife has a current barrier to Palliative home visit due to bill she received for services.  States bill has been discussed via phone call with wife and advised that patient should only be billed for co-pay and does not have to pay if unable to afford.  States plan of care includes visits or phone calls as patient request but no appointment date for follow-up is established unless patient request.   RN CM provided update that patient's condition at this time is bedbound and unable to get into MD office for appointments without ambulance transportation.  RN CM updated that wife is considering [facility].  Rodena Piety states Dr. Konrad Dolores can also make home visit if patient and wife allow and consent to visit.   Mariann Laster, RN, BSN, [facility], CCM  [employer] [employer] Management Coordinator [phone] Direct [phone] Cell [phone] Office [phone] Fax

## 2015-06-21 ENCOUNTER — Other Ambulatory Visit: Payer: Self-pay | Admitting: *Deleted

## 2015-06-21 DIAGNOSIS — I4891 Unspecified atrial fibrillation: Secondary | ICD-10-CM

## 2015-06-21 DIAGNOSIS — N289 Disorder of kidney and ureter, unspecified: Secondary | ICD-10-CM

## 2015-06-21 NOTE — Patient Outreach (Addendum)
Late Entry from 06/19/15:  Discussed member's condition with telephonic care manager, C. Hutchinson.  She was made aware that member's wife is hesitant to have this care manager provide home visit, stating that she is overwhelmed and the she prefers to continue working with [name].  Referral placed back to telephonic care manager.  Will close to community, but will be available if wife changes mind and is agreeable to home visit.  Valente David, [place], MSN [place] [phone]

## 2015-06-22 ENCOUNTER — Other Ambulatory Visit: Payer: Self-pay

## 2015-06-22 DIAGNOSIS — I482 Chronic atrial fibrillation, unspecified: Secondary | ICD-10-CM

## 2015-06-22 NOTE — Patient Outreach (Addendum)
[facility]) Care Management  06/22/2015  [name] September 12, 1931 [id number]  CNA H/o Dr. Felipa Eth sent new order for CNA to [facility] on 06/08/2015.  Wife verified 06/22/15 she has not received any CNA visits yet or any updates from [facility] regarding service.  Wife confirms that CNA visit 1 time a week would help manage care in the home.  RN CM will contact [facility] for update on CNA order.   RN CM contacted [facility]:  Contact Niki who states [facility] nurse assessment did not warrant additional CNA services as patient has 2 caregivers in the home.  States Medicare does not cover custodial services.  Ardelle Park states that even though MD has ordered that [facility] RN nursing assessment did not justify order.   RN CM advised in disappointment that nurses assessment to not note that patient has declined over the past month and change in condition has made it impossible for patient to ambulate out of the home to MD appt's.  Ardelle Park stated patient may be more Hospice appropriate.  RN CM confirmed that RN CM spoke with Palliative Care services this week who confirmed patient is not hospice appropriate.   RN CM advised that patient's wife is elderly and needs assistance in the home to manage patients care in the home.  Wife has been private paying for temporary service to assist as affordable.   RN CM advised that patients needs and condition warrant a CNA service which can be provided in addition to custodial services as long as not provided on the same day.   RN CM requested reconsideration to this decision as patient is entitled to this service through Medicare.  RN CM advised that provider can not deny patient this service out of fear that claim will be denied.   RN CM advised in knowledge that Personal care is not covered by Medicare when this is the only care patient needs; however this patient currently receives [facility] RN and PT services.  CNA would be an intermittent health service patient is  entitled to receive.   Source:  Social research officer, government, [facility], 50.2 [place] at [employer].pdf. [name] June 8th, 2016)   Mariann Laster, RN, BSN, [facility], [place] Management Care Management Coordinator [phone] Direct [phone] Cell [phone] Office [phone] Fax

## 2015-06-22 NOTE — Patient Outreach (Addendum)
[place] [facility]) Care Management  06/22/2015  [name] 1931-03-05 [id number]   Care Coordination Services  Contact call to wife.   Wife confirms she has spoken with Palliative Care Services regarding bill and issue resolved.   CNA H/o Dr. Felipa Eth sent new order for CNA to [facility] on 06/08/2015.   Wife states she has not received any CNA visits yet or any updates from [facility] regarding service.  Wife confirms that CNA visit 1 time a week would help manage care in the home.   RN CM will contact [facility] for update on CNA order.   MD appt: Homebound status and unable to get to MD appt's any longer: Wife agreed to referral to visiting home physician if affordable.   RN CM will follow-up regarding cost.   New Wife confirms Foley in place and changes in urine output, color.  States [facility] Nurse managing.  Wife confirms increased areas of skin irritation on buttock side which has more redness than usual.  RN CM will provide [facility] Community RN CM this update.   Mariann Laster, RN, BSN, [facility], CCM  [facility] [employer] Management Coordinator [phone] Direct [phone] Cell [phone] Office [phone] Fax

## 2015-06-22 NOTE — Patient Outreach (Signed)
[place] [facility]) Care Management  06/22/2015  [name] 02/10/1931 [id number]   Care Coordination Services  Issue:  Homebound status and unable to get to MD appt's any longer: Wife agreed to referral to visiting home physician if affordable.  RN CM will follow-up regarding cost.   Contact call to provider:  [facility]  [phone] Contact confirms patient would pay co-pay as any regular office visit but will be charged an additional trip charge of $95.00 for each home visit.   RN CM provided update to wife who confirmed this is not affordable.   Wife agreed to [place] visit with [facility] NP and staying with Dr. Felipa Eth.  RN CM sent new order for [name]   Mariann Laster, RN, BSN, [facility], CCM  [facility] [employer] Management Coordinator [phone] Direct [phone] Cell [phone] Office [phone] Fax

## 2015-06-23 ENCOUNTER — Other Ambulatory Visit: Payer: Self-pay | Admitting: Licensed Clinical Social Worker

## 2015-06-23 NOTE — Patient Outreach (Signed)
[place] [facility]) Care Management  06/23/2015  [name] [birth year] [id number]   Assessment- CSW completed outreach to patient's residence and spouse answered but questioned if she could call CSW back when she is able to. CSW did not receive a return call back from spouse. CSW completed an additional outreach and was able to reach her. CSW provided update on what all RNCM has completed. Spouse reports "You guys have been wonderful. Someone from [employer] came today and gave Pearce his bath so I appreciative you guys." CSW questioned if she had been in contact with Social Services to set up appointment to apply for [employer] and she stated yes. She reports that who she talked to was extremely helpful and mailed out a few forms to be completed by her and that her appointment is in a few weeks.  Plan-CSW has informed spouse that she will be out of the office all of next week and will contact her in three weeks. Family is agreeable to this.  [name], BSW, MSW, [email or web address] Phone: [phone] Fax: [phone]

## 2015-06-26 ENCOUNTER — Other Ambulatory Visit: Payer: Self-pay

## 2015-06-26 NOTE — Patient Outreach (Addendum)
[place] [facility]) Care Management  06/26/2015  [name] Apr 10, 1931 [id number]   Care Coordination Services  Update received from home health provider:  [facility]: Contact Niki on 06/23/15. States provider has agreed to provide CNA 1 time per week per Dr. Carlyle Lipa order (06/08/15).  Plan:  RN CM will provide [facility] Community NP CM and SW  this update.   [place], RN, BSN, [facility], CCM  [facility] Management Coordinator [phone] Direct [phone] Cell [phone] Office [phone] Fax

## 2015-06-28 ENCOUNTER — Encounter: Payer: Self-pay | Admitting: Internal Medicine

## 2015-06-29 ENCOUNTER — Other Ambulatory Visit: Payer: Self-pay | Admitting: *Deleted

## 2015-06-29 NOTE — Patient Outreach (Signed)
[facility]) Care Management   06/29/2015  BEZALEL SENN 1931-10-27 [id number]  [name] is an 82 y.o. male  Subjective: Referred from a co-worker to evaluate pt status as he cannot go to the MD office anylonger.  Objective:   Review of Systems  HENT: Positive for hearing loss.   Eyes: Negative.   Respiratory: Positive for cough.        Easily choked. Does not require a chopped diet.  Cardiovascular: Negative.   Gastrointestinal: Negative.   Genitourinary:       Has a foley catheter.  Musculoskeletal: Positive for myalgias.       RLS.  Neurological: Positive for weakness.  Endo/Heme/Allergies: Negative.     Physical Exam  Encounter Medications:   Outpatient Encounter Prescriptions as of 06/29/2015  Medication Sig Note  . aspirin EC 81 MG tablet Take 81 mg by mouth daily.   Marland Kitchen donepezil (ARICEPT) 10 MG tablet Take 10 mg by mouth at bedtime.   Marland Kitchen HYDROcodone-acetaminophen (NORCO/VICODIN) 5-325 MG tablet Take 1 tablet by mouth every 6 (six) hours as needed for moderate pain.   Marland Kitchen levothyroxine (SYNTHROID, LEVOTHROID) 137 MCG tablet Take 137 mcg by mouth daily before breakfast.   . magnesium oxide (MAG-OX) 400 (241.3 Mg) MG tablet Take 1 tablet (400 mg total) by mouth daily. For 3 weeks   . megestrol (MEGACE) 20 MG tablet Take 20 mg by mouth 2 (two) times daily.  10/01/2014: .  . mirabegron ER (MYRBETRIQ) 25 MG TB24 tablet Take 25 mg by mouth daily.   . Multiple Vitamin (MULTIVITAMIN WITH MINERALS) TABS tablet Take 1 tablet by mouth daily.   . pramipexole (MIRAPEX) 0.25 MG tablet Take 0.25 mg by mouth at bedtime.   . Probiotic Product (PROBIOTIC PO) Take 1 tablet by mouth daily.   . propafenone (RYTHMOL) 150 MG tablet Take 150 mg by mouth daily.   Marland Kitchen tiZANidine (ZANAFLEX) 2 MG tablet Take 2 mg by mouth every 8 (eight) hours as needed for muscle spasms.   Marland Kitchen zolpidem (AMBIEN) 10 MG tablet Take 10 mg by mouth at bedtime as needed for sleep.  06/15/2014: .    No  facility-administered encounter medications on file as of 06/29/2015.    Functional Status:   In your present state of health, do you have any difficulty performing the following activities: 06/07/2015 05/01/2015  Hearing? N N  Vision? N N  Difficulty concentrating or making decisions? N Y  Walking or climbing stairs? Y Y  Dressing or bathing? Y Y  Doing errands, shopping? Tempie Donning  Preparing Food and eating ? Y -  Using the Toilet? Y -  In the past six months, have you accidently leaked urine? N -  Do you have problems with loss of bowel control? N -  Managing your Medications? Y -  Managing your Finances? Y -  Housekeeping or managing your Housekeeping? Y -    Fall/Depression Screening:    PHQ 2/9 Scores 06/14/2015 06/07/2015 01/27/2014 01/27/2014  PHQ - 2 Score 0 0 0 0    Assessment:  AFIB, Bradycardic                         BEDBOUND                         ANEMIA  Cerumen Impaction bilaterally                          Plan: Return next week to draw CBC, BMET and will ask Dr. Felipa Eth what else he may like.           Removed large amount of cerumen bilaterally. Instructed to get ear wax drops and use daily for 5 days, wipe out liquid cerumen with a tissue. Do not use Qtip. Do this each first week of every month ongoing.           Pt so very deconditioned and is not able to sit up in his regular wheelchair.  Discussed practicality of a lift chair vs a wheelchair with a reclining back. Family to discuss.           Re-evaluate meds - I have asked that they stop giving Zanaflex routinely and only use this prn.  Do we have to take the propranolol? Pt can be hypotensive.  [name] [place] [phone]

## 2015-06-30 ENCOUNTER — Inpatient Hospital Stay (HOSPITAL_COMMUNITY)
Admission: EM | Admit: 2015-06-30 | Discharge: 2015-07-08 | DRG: 698 | Disposition: A | Discharge status: Home Health | Payer: PPO | Attending: Internal Medicine | Admitting: Internal Medicine

## 2015-06-30 ENCOUNTER — Other Ambulatory Visit: Payer: Self-pay | Admitting: *Deleted

## 2015-06-30 ENCOUNTER — Encounter (HOSPITAL_COMMUNITY): Payer: Self-pay

## 2015-06-30 DIAGNOSIS — N135 Crossing vessel and stricture of ureter without hydronephrosis: Secondary | ICD-10-CM | POA: Diagnosis present

## 2015-06-30 DIAGNOSIS — G20 Parkinson's disease: Secondary | ICD-10-CM | POA: Diagnosis present

## 2015-06-30 DIAGNOSIS — A419 Sepsis, unspecified organism: Secondary | ICD-10-CM | POA: Diagnosis not present

## 2015-06-30 DIAGNOSIS — Z8673 Personal history of transient ischemic attack (TIA), and cerebral infarction without residual deficits: Secondary | ICD-10-CM

## 2015-06-30 DIAGNOSIS — I495 Sick sinus syndrome: Secondary | ICD-10-CM | POA: Diagnosis present

## 2015-06-30 DIAGNOSIS — I482 Chronic atrial fibrillation, unspecified: Secondary | ICD-10-CM

## 2015-06-30 DIAGNOSIS — Z87440 Personal history of urinary (tract) infections: Secondary | ICD-10-CM

## 2015-06-30 DIAGNOSIS — E872 Acidosis: Secondary | ICD-10-CM | POA: Diagnosis present

## 2015-06-30 DIAGNOSIS — I4891 Unspecified atrial fibrillation: Secondary | ICD-10-CM | POA: Diagnosis present

## 2015-06-30 DIAGNOSIS — B952 Enterococcus as the cause of diseases classified elsewhere: Secondary | ICD-10-CM | POA: Diagnosis present

## 2015-06-30 DIAGNOSIS — N183 Chronic kidney disease, stage 3 (moderate): Secondary | ICD-10-CM | POA: Diagnosis present

## 2015-06-30 DIAGNOSIS — Y846 Urinary catheterization as the cause of abnormal reaction of the patient, or of later complication, without mention of misadventure at the time of the procedure: Secondary | ICD-10-CM | POA: Diagnosis present

## 2015-06-30 DIAGNOSIS — N179 Acute kidney failure, unspecified: Secondary | ICD-10-CM

## 2015-06-30 DIAGNOSIS — N1330 Unspecified hydronephrosis: Secondary | ICD-10-CM | POA: Diagnosis present

## 2015-06-30 DIAGNOSIS — I4510 Unspecified right bundle-branch block: Secondary | ICD-10-CM | POA: Diagnosis present

## 2015-06-30 DIAGNOSIS — Z7401 Bed confinement status: Secondary | ICD-10-CM | POA: Diagnosis not present

## 2015-06-30 DIAGNOSIS — Z79891 Long term (current) use of opiate analgesic: Secondary | ICD-10-CM

## 2015-06-30 DIAGNOSIS — Z96652 Presence of left artificial knee joint: Secondary | ICD-10-CM | POA: Diagnosis present

## 2015-06-30 DIAGNOSIS — F0390 Unspecified dementia without behavioral disturbance: Secondary | ICD-10-CM | POA: Diagnosis present

## 2015-06-30 DIAGNOSIS — Z79818 Long term (current) use of other agents affecting estrogen receptors and estrogen levels: Secondary | ICD-10-CM | POA: Diagnosis not present

## 2015-06-30 DIAGNOSIS — D631 Anemia in chronic kidney disease: Secondary | ICD-10-CM | POA: Diagnosis present

## 2015-06-30 DIAGNOSIS — Z7982 Long term (current) use of aspirin: Secondary | ICD-10-CM | POA: Diagnosis not present

## 2015-06-30 DIAGNOSIS — Z79899 Other long term (current) drug therapy: Secondary | ICD-10-CM

## 2015-06-30 DIAGNOSIS — G250 Essential tremor: Secondary | ICD-10-CM | POA: Diagnosis present

## 2015-06-30 DIAGNOSIS — Z8546 Personal history of malignant neoplasm of prostate: Secondary | ICD-10-CM | POA: Diagnosis not present

## 2015-06-30 DIAGNOSIS — Z823 Family history of stroke: Secondary | ICD-10-CM

## 2015-06-30 DIAGNOSIS — I481 Persistent atrial fibrillation: Secondary | ICD-10-CM | POA: Diagnosis present

## 2015-06-30 DIAGNOSIS — N390 Urinary tract infection, site not specified: Secondary | ICD-10-CM | POA: Diagnosis not present

## 2015-06-30 DIAGNOSIS — Z66 Do not resuscitate: Secondary | ICD-10-CM | POA: Diagnosis present

## 2015-06-30 DIAGNOSIS — R066 Hiccough: Secondary | ICD-10-CM | POA: Diagnosis present

## 2015-06-30 DIAGNOSIS — R112 Nausea with vomiting, unspecified: Secondary | ICD-10-CM | POA: Diagnosis present

## 2015-06-30 DIAGNOSIS — Z950 Presence of cardiac pacemaker: Secondary | ICD-10-CM | POA: Diagnosis not present

## 2015-06-30 DIAGNOSIS — T83518A Infection and inflammatory reaction due to other urinary catheter, initial encounter: Secondary | ICD-10-CM | POA: Diagnosis present

## 2015-06-30 DIAGNOSIS — N189 Chronic kidney disease, unspecified: Secondary | ICD-10-CM | POA: Diagnosis not present

## 2015-06-30 DIAGNOSIS — R197 Diarrhea, unspecified: Secondary | ICD-10-CM

## 2015-06-30 LAB — CBC
HEMATOCRIT: 28.2 % — AB (ref 39.0–52.0)
HEMOGLOBIN: 9.5 g/dL — AB (ref 13.0–17.0)
MCH: 29 pg (ref 26.0–34.0)
MCHC: 33.7 g/dL (ref 30.0–36.0)
MCV: 86 fL (ref 78.0–100.0)
Platelets: 271 10*3/uL (ref 150–400)
RBC: 3.28 MIL/uL — AB (ref 4.22–5.81)
RDW: 17.7 % — ABNORMAL HIGH (ref 11.5–15.5)
WBC: 14.2 10*3/uL — ABNORMAL HIGH (ref 4.0–10.5)

## 2015-06-30 LAB — URINALYSIS, ROUTINE W REFLEX MICROSCOPIC
Bilirubin Urine: NEGATIVE
Glucose, UA: NEGATIVE mg/dL
KETONES UR: NEGATIVE mg/dL
NITRITE: NEGATIVE
PH: 6 (ref 5.0–8.0)
Protein, ur: 100 mg/dL — AB
SPECIFIC GRAVITY, URINE: 1.014 (ref 1.005–1.030)

## 2015-06-30 LAB — COMPREHENSIVE METABOLIC PANEL
ALBUMIN: 3.1 g/dL — AB (ref 3.5–5.0)
ALK PHOS: 50 U/L (ref 38–126)
ALT: 11 U/L — ABNORMAL LOW (ref 17–63)
ANION GAP: 9 (ref 5–15)
AST: 14 U/L — ABNORMAL LOW (ref 15–41)
BUN: 55 mg/dL — ABNORMAL HIGH (ref 6–20)
CALCIUM: 8.7 mg/dL — AB (ref 8.9–10.3)
CO2: 17 mmol/L — AB (ref 22–32)
Chloride: 104 mmol/L (ref 101–111)
Creatinine, Ser: 4.29 mg/dL — ABNORMAL HIGH (ref 0.61–1.24)
GFR calc Af Amer: 14 mL/min — ABNORMAL LOW (ref >60)
GFR calc non Af Amer: 12 mL/min — ABNORMAL LOW (ref >60)
GLUCOSE: 133 mg/dL — AB (ref 65–99)
Potassium: 4.7 mmol/L (ref 3.5–5.1)
SODIUM: 130 mmol/L — AB (ref 135–145)
Total Bilirubin: 0.6 mg/dL (ref 0.3–1.2)
Total Protein: 7.1 g/dL (ref 6.5–8.1)

## 2015-06-30 LAB — URINE MICROSCOPIC-ADD ON: Squamous Epithelial / LPF: NONE SEEN

## 2015-06-30 LAB — CBG MONITORING, ED: Glucose-Capillary: 131 mg/dL — ABNORMAL HIGH (ref 65–99)

## 2015-06-30 LAB — I-STAT CG4 LACTIC ACID, ED: LACTIC ACID, VENOUS: 1.05 mmol/L (ref 0.5–2.0)

## 2015-06-30 MED ORDER — PROPAFENONE HCL 225 MG PO TABS
225.0000 mg | ORAL_TABLET | Freq: Two times a day (BID) | ORAL | Status: DC
  Administered 2015-07-01 – 2015-07-08 (×16): 225 mg via ORAL
  Filled 2015-06-30 (×17): qty 1

## 2015-06-30 MED ORDER — SODIUM CHLORIDE 0.9 % IV BOLUS (SEPSIS)
1000.0000 mL | Freq: Once | INTRAVENOUS | Status: AC
  Administered 2015-06-30: 1000 mL via INTRAVENOUS

## 2015-06-30 MED ORDER — DONEPEZIL HCL 23 MG PO TABS
23.0000 mg | ORAL_TABLET | Freq: Every day | ORAL | Status: DC
  Administered 2015-07-01 – 2015-07-07 (×8): 23 mg via ORAL
  Filled 2015-06-30 (×9): qty 1

## 2015-06-30 MED ORDER — ACETAMINOPHEN 650 MG RE SUPP: 650.0000 mg | Freq: Four times a day (QID) | RECTAL | Status: DC | PRN

## 2015-06-30 MED ORDER — PRAMIPEXOLE DIHYDROCHLORIDE 0.25 MG PO TABS
0.2500 mg | ORAL_TABLET | Freq: Every day | ORAL | Status: DC
  Administered 2015-07-01 – 2015-07-07 (×8): 0.25 mg via ORAL
  Filled 2015-06-30 (×9): qty 1

## 2015-06-30 MED ORDER — FLUCONAZOLE IN SODIUM CHLORIDE 200-0.9 MG/100ML-% IV SOLN
200.0000 mg | Freq: Once | INTRAVENOUS | Status: AC
  Administered 2015-07-01: 200 mg via INTRAVENOUS
  Filled 2015-06-30: qty 100

## 2015-06-30 MED ORDER — MIRABEGRON ER 25 MG PO TB24
25.0000 mg | ORAL_TABLET | Freq: Every day | ORAL | Status: DC
  Administered 2015-07-01 – 2015-07-08 (×8): 25 mg via ORAL
  Filled 2015-06-30 (×8): qty 1

## 2015-06-30 MED ORDER — DEXTROSE 5 % IV SOLN
2.0000 g | Freq: Once | INTRAVENOUS | Status: AC
  Administered 2015-07-01: 2 g via INTRAVENOUS
  Filled 2015-06-30: qty 2

## 2015-06-30 MED ORDER — ONDANSETRON HCL 4 MG/2ML IJ SOLN
4.0000 mg | Freq: Once | INTRAMUSCULAR | Status: AC
Start: 2015-06-30 — End: 2015-06-30
  Administered 2015-06-30: 4 mg via INTRAVENOUS
  Filled 2015-06-30: qty 2

## 2015-06-30 MED ORDER — DEXTROSE 5 % IV SOLN
1.0000 g | Freq: Once | INTRAVENOUS | Status: AC
  Administered 2015-06-30: 1 g via INTRAVENOUS
  Filled 2015-06-30: qty 10

## 2015-06-30 MED ORDER — MEGESTROL ACETATE 20 MG PO TABS
20.0000 mg | ORAL_TABLET | Freq: Two times a day (BID) | ORAL | Status: DC
  Administered 2015-07-01 – 2015-07-08 (×16): 20 mg via ORAL
  Filled 2015-06-30 (×17): qty 1

## 2015-06-30 MED ORDER — DIPHENOXYLATE-ATROPINE 2.5-0.025 MG PO TABS
1.0000 | ORAL_TABLET | Freq: Once | ORAL | Status: AC
  Administered 2015-06-30: 1 via ORAL
  Filled 2015-06-30: qty 1

## 2015-06-30 MED ORDER — SODIUM CHLORIDE 0.9 % IV SOLN
INTRAVENOUS | Status: DC
  Administered 2015-07-01 – 2015-07-02 (×5): via INTRAVENOUS

## 2015-06-30 MED ORDER — ONDANSETRON HCL 4 MG/2ML IJ SOLN: 4.0000 mg | Freq: Four times a day (QID) | INTRAMUSCULAR | Status: DC | PRN

## 2015-06-30 MED ORDER — SODIUM CHLORIDE 0.9 % IV SOLN
Freq: Once | INTRAVENOUS | Status: AC
  Administered 2015-06-30: 17:00:00 via INTRAVENOUS

## 2015-06-30 MED ORDER — ASPIRIN EC 81 MG PO TBEC
81.0000 mg | DELAYED_RELEASE_TABLET | Freq: Every day | ORAL | Status: DC
  Administered 2015-07-01 – 2015-07-08 (×8): 81 mg via ORAL
  Filled 2015-06-30 (×8): qty 1

## 2015-06-30 MED ORDER — SODIUM CHLORIDE 0.9 % IV BOLUS (SEPSIS)
500.0000 mL | Freq: Once | INTRAVENOUS | Status: AC
  Administered 2015-06-30: 500 mL via INTRAVENOUS

## 2015-06-30 MED ORDER — ONDANSETRON HCL 4 MG PO TABS: 4.0000 mg | ORAL_TABLET | Freq: Four times a day (QID) | ORAL | Status: DC | PRN

## 2015-06-30 MED ORDER — PROPRANOLOL HCL ER 60 MG PO CP24
60.0000 mg | ORAL_CAPSULE | Freq: Every day | ORAL | Status: DC
  Administered 2015-07-01 – 2015-07-08 (×7): 60 mg via ORAL
  Filled 2015-06-30 (×8): qty 1

## 2015-06-30 MED ORDER — ACETAMINOPHEN 325 MG PO TABS: 650.0000 mg | ORAL_TABLET | Freq: Four times a day (QID) | ORAL | Status: DC | PRN

## 2015-06-30 MED ORDER — SODIUM CHLORIDE 0.9 % IV SOLN: Freq: Once | INTRAVENOUS | Status: DC

## 2015-06-30 MED ORDER — SODIUM CHLORIDE 0.9% FLUSH
3.0000 mL | Freq: Two times a day (BID) | INTRAVENOUS | Status: DC
  Administered 2015-07-04 – 2015-07-08 (×4): 3 mL via INTRAVENOUS

## 2015-06-30 MED ORDER — LEVOTHYROXINE SODIUM 25 MCG PO TABS
137.0000 ug | ORAL_TABLET | Freq: Every day | ORAL | Status: DC
  Administered 2015-07-01 – 2015-07-08 (×8): 137 ug via ORAL
  Filled 2015-06-30 (×8): qty 1

## 2015-06-30 NOTE — ED Provider Notes (Signed)
CSN: [id number]     Arrival date & time 06/30/15  1537 History   First MD Initiated Contact with Patient 06/30/15 1559     Chief Complaint  Patient presents with  . Weakness  . Nausea  . Emesis      HPI  Pt presents for eval of a 3 day illness.   Describes 2-3 episodes each of vomiting and diarrhea for last 2 days. No AP.  No BRBPR.  Heme neg, non bilious emesis. Recent antibiotic for ?UTI, last dose today.  His wife is later able to confirm that they have a visiting home health care nurse that collected a urine sample and it went to an outside lab. He was placed on Cipro and finished this yesterday. They were never called with the urine culture results.  Denies syncope, near syncope, or lightheaded.  Past Medical History  Diagnosis Date  . Orthostatic lightheadedness   . History of subdural hematoma     LEFT CHRONIC SUBDURAL HEMATOMA   S/P PARIETAL CRANIOTOMY WITH EVACUATION HEMATOMA  . Atrial fibrillation, persistent ([place])   . H/O cardiac radiofrequency ablation     A-FLUTTER  . Cardiac pacemaker last check 08-30-2013    05-05-2007  DD  MEDTRONIC GUIDANT INSIGNIA  . RBBB   . Benign essential tremor   . Diverticulosis of colon   . Tachy-brady syndrome ([place])   . SSS (sick sinus syndrome) ([place])   . Hydronephrosis, bilateral   . Renal insufficiency   . Recurrent prostate carcinoma ([place])     2005  S/P RADIOACTIVE SEED IMPLANTS/   BLADDER NECK MASS RESECTED 05/ 2015  SHOWED RECURRENT PROSTATE CANCER  . CHF (congestive heart failure) ([place])   . Anemia    Past Surgical History  Procedure Laterality Date  . Tonsillectomy  as child  . Subdural hematoma evacuation via craniotomy  12-16-2007  . Rotator cuff repair w/ distal clavicle excision Left 08-31-2010  . Pacemaker insertion  05-05-2007      Guidant Insignia [employer])  . Total knee arthroplasty  12/18/2010    Procedure: TOTAL KNEE ARTHROPLASTY;  Surgeon: Mauri Pole;  Location: [facility];  Service: Orthopedics;  Laterality:  Left;  . Cataract extraction    . Cystoscopy w/ ureteral stent placement Bilateral 05/30/2013    Procedure: CYSTOSCOPY WITH RETROGRADE PYELOGRAM/ BILATERAL URETERAL STENT PLACEMENT, TRANSURETRAL RESECTION OF BLADDER TUMOR WITH GYRUS;  Surgeon: Alexis Frock, MD;  Location: [facility];  Service: Urology;  Laterality: Bilateral;  . Cardioversion N/A 08/04/2013    Procedure: CARDIOVERSION;  Surgeon: Sanda Klein, MD;  [facility];  Service: Cardiovascular;  Laterality: N/A;  . Knee arthroscopy Left 02-11-2007  . Cardioversion  multiple--  last one 08-04-2013    SINCE 2008  . Cardiac electrophysiology mapping and ablation  04-06-2007  dr Caryl Comes  . Cardiac catheterization  12-22-2006      non-obstructive CAD / RCA 20%/  ef 50%/  a-fib  . Radioactive prostate seed implants  12-21-2003  . Orif left distal clavical fx  08-31-2010  . Colonoscopy w/ polypectomy  12-01-2008  . Transthoracic echocardiogram  05-31-2013    MILD LVH/  EF 65%/  MODERATE LAE/  TRIVIAL MR  &  TR  . Cystoscopy/retrograde/ureteroscopy Right 09/16/2013    Procedure: CYSTOSCOPY/RETROGRADE/URETEROSCOPIC STENT EXTRACTION/  bil retrograde pyelogram with inseertion of bilateral stents;  Surgeon: Jorja Loa, MD;  Location: [facility];  Service: Urology;  Laterality: Right;  . Cystoscopy w/ ureteral stent placement Bilateral 10/10/2014  Procedure: CYSTOSCOPY, WITH STENT REPLACEMENT, RIGHT URETEROSCOPY ;  Surgeon: Franchot Gallo, MD;  [facility]: [facility];  Service: Urology;  Laterality: Bilateral;   Family History  Problem Relation Age of Onset  . Aneurysm Father   . Stroke Father    Social History  Substance Use Topics  . Smoking status: Never Smoker   . Smokeless tobacco: Never Used  . Alcohol Use: 12.6 oz/week    7 Glasses of wine, 7 Shots of liquor, 7 Standard drinks or equivalent per week     Comment: 2 glasses wine day    Review of Systems  Constitutional: Negative for fever, chills,  diaphoresis, appetite change and fatigue.  HENT: Negative for mouth sores, sore throat and trouble swallowing.   Eyes: Negative for visual disturbance.  Respiratory: Negative for cough, chest tightness, shortness of breath and wheezing.   Cardiovascular: Negative for chest pain.  Gastrointestinal: Positive for nausea and diarrhea. Negative for vomiting, abdominal pain and abdominal distention.  Endocrine: Negative for polydipsia, polyphagia and polyuria.  Genitourinary: Negative for dysuria, frequency and hematuria.  Musculoskeletal: Negative for gait problem.  Skin: Negative for color change, pallor and rash.  Neurological: Negative for dizziness, syncope, light-headedness and headaches.  Hematological: Does not bruise/bleed easily.  Psychiatric/Behavioral: Negative for behavioral problems and confusion.      Allergies  Review of patient's allergies indicates no known allergies.  Home Medications   Prior to Admission medications   Medication Sig Start Date End Date Taking? Authorizing Provider  aspirin EC 81 MG tablet Take 81 mg by mouth daily.   Yes Historical Provider, MD  donepezil (ARICEPT) 23 MG TABS tablet Take 23 mg by mouth at bedtime.   Yes Historical Provider, MD  levothyroxine (SYNTHROID, LEVOTHROID) 137 MCG tablet Take 137 mcg by mouth daily before breakfast.   Yes Historical Provider, MD  megestrol (MEGACE) 20 MG tablet Take 20 mg by mouth 2 (two) times daily.    Yes Historical Provider, MD  Melatonin 5 MG CAPS Take 5 mg by mouth at bedtime.   Yes Historical Provider, MD  mirabegron ER (MYRBETRIQ) 25 MG TB24 tablet Take 25 mg by mouth daily.   Yes Historical Provider, MD  Multiple Vitamin (MULTIVITAMIN WITH MINERALS) TABS tablet Take 1 tablet by mouth daily.   Yes Historical Provider, MD  pramipexole (MIRAPEX) 0.25 MG tablet Take 0.25 mg by mouth at bedtime. 01/27/15  Yes Historical Provider, MD  Probiotic Product (PROBIOTIC PO) Take 1 tablet by mouth daily.   Yes  Historical Provider, MD  propafenone (RYTHMOL) 225 MG tablet Take 225 mg by mouth 2 (two) times daily.    Yes Historical Provider, MD  propranolol ER (INDERAL LA) 60 MG 24 hr capsule Take 60 mg by mouth daily. 03/27/15  Yes Historical Provider, MD  tiZANidine (ZANAFLEX) 2 MG tablet Take 2 mg by mouth every 8 (eight) hours as needed for muscle spasms.   Yes Historical Provider, MD  zolpidem (AMBIEN) 10 MG tablet Take 10 mg by mouth at bedtime as needed for sleep. Reported on 06/29/2015 03/29/14  Yes Historical Provider, MD  ciprofloxacin (CIPRO) 250 MG tablet Take 250 mg by mouth 2 (two) times daily. Reported on 06/30/2015 06/21/15   Historical Provider, MD  HYDROcodone-acetaminophen (NORCO/VICODIN) 5-325 MG tablet Take 1 tablet by mouth every 6 (six) hours as needed for moderate pain. Patient not taking: Reported on 06/29/2015 05/04/15   Bonnielee Haff, MD  magnesium oxide (MAG-OX) 400 (241.3 Mg) MG tablet Take 1 tablet (400 mg total) by  mouth daily. For 3 weeks Patient not taking: Reported on 06/29/2015 05/04/15   Bonnielee Haff, MD   BP 126/53 mmHg  Pulse 56  Temp(Src) 98.5 F (36.9 C) (Oral)  Resp 19  Ht 6\' 1"  (1.854 m)  Wt 170 lb (77.111 kg)  BMI 22.43 kg/m2  SpO2 100% Physical Exam  Constitutional: He is oriented to person, place, and time. He appears well-developed and well-nourished. No distress.  HENT:  Head: Normocephalic.  No scleral icterus, MM moist, conjunctiva not pale.  Eyes: Conjunctivae are normal. Pupils are equal, round, and reactive to light. No scleral icterus.  Neck: Normal range of motion. Neck supple. No thyromegaly present.  Cardiovascular: Normal rate and regular rhythm.  Exam reveals no gallop and no friction rub.   No murmur heard. Pulmonary/Chest: Effort normal and breath sounds normal. No respiratory distress. He has no wheezes. He has no rales.  Abdominal: Soft. Bowel sounds are normal. He exhibits no distension. There is no tenderness. There is no rebound.   Musculoskeletal: Normal range of motion.  Neurological: He is alert and oriented to person, place, and time.  Skin: Skin is warm and dry. No rash noted.  Psychiatric: He has a normal mood and affect. His behavior is normal.    ED Course  Procedures (including critical care time) Labs Review Labs Reviewed  CBC - Abnormal; Notable for the following:    WBC 14.2 (*)    RBC 3.28 (*)    Hemoglobin 9.5 (*)    HCT 28.2 (*)    RDW 17.7 (*)    All other components within normal limits  URINALYSIS, ROUTINE W REFLEX MICROSCOPIC (NOT AT [facility]) - Abnormal; Notable for the following:    Color, Urine AMBER (*)    APPearance TURBID (*)    Hgb urine dipstick LARGE (*)    Protein, ur 100 (*)    Leukocytes, UA LARGE (*)    All other components within normal limits  COMPREHENSIVE METABOLIC PANEL - Abnormal; Notable for the following:    Sodium 130 (*)    CO2 17 (*)    Glucose, Bld 133 (*)    BUN 55 (*)    Creatinine, Ser 4.29 (*)    Calcium 8.7 (*)    Albumin 3.1 (*)    AST 14 (*)    ALT 11 (*)    GFR calc non Af Amer 12 (*)    GFR calc Af Amer 14 (*)    All other components within normal limits  URINE MICROSCOPIC-ADD ON - Abnormal; Notable for the following:    Bacteria, UA MANY (*)    All other components within normal limits  CBG MONITORING, ED - Abnormal; Notable for the following:    Glucose-Capillary 131 (*)    All other components within normal limits  C DIFFICILE QUICK SCREEN W PCR REFLEX  URINE CULTURE  I-STAT CG4 LACTIC ACID, ED    Imaging Review No results found. I have personally reviewed and evaluated these images and lab results as part of my medical decision-making.   EKG Interpretation None      MDM   Final diagnoses:  Acute kidney injury (HCC)  Diarrhea, unspecified type  UTI (lower urinary tract infection)     Patient with acute kidney injury and creatinine of 4.29. BUN 55. Likely prerenal. Continued UTI. Has with cytosis. Urine is cultured. Given IV  Rocephin. Additional IV fluids and baseline fluid rate. I placed a call to [facility] hospital[facility] regarding admission.   Elta Guadeloupe  Jeneen Rinks, MD 06/30/15 [id number]

## 2015-06-30 NOTE — Patient Outreach (Signed)
Returned call to pt home. Daughter, Reino Bellis answered and said they had called this morning and were directed to call EMS. They came out and evaluated pt and decided he didn't need to go to ED. Later pt vomited, had some diarrhea and family noted he was coughing. They called EMS again and this time he was transported to [facility].  I advised I would be following via the EMR. Although I gave them my card yesterday, they called the nurse line instead of my cell. I reinforced for them to call my cell for prompt personal attention.  [name] [facility] [name] [phone]

## 2015-06-30 NOTE — ED Notes (Addendum)
BIB by [facility], pt c/o n/v, diarrhea, and weakness x3 days. Pt recently finished a abx for UTI. Pt has hx of anemia. Pt arrives A+OX4, speaking in complete sentences.

## 2015-06-30 NOTE — ED Notes (Signed)
Bed: [id number] Expected date: 06/30/15 Expected time: 3:29 PM Means of arrival:  Comments: N/V

## 2015-06-30 NOTE — H&P (Signed)
History and Physical    DEITRICH DECLEENE [id number] DOB: 08-05-1931 DOA: 06/30/2015  PCP: [name], MD  UROLOGY: Dr. Diona Fanti Cardiology: Dr. Caryl Comes  Patient coming from: Home  Chief Complaint: Nausea, vomiting, and diarrhea  HPI: [name] is a 82 y.o. gentleman with a history of atrial fibrillation, sick sinus syndrome S/P PPM, CVA, CKD 3, and prostate cancer with chronic indwelling foley catheter and bilateral ureteral stents who was admitted in April for evaluation of syncope.  He had a UTI at that time, but sepsis was ruled out.  He ultimately discharged to home.  He has been followed by home health.  He just completed a seven day course of cipro for possible UTI (no culture results were disclosed per family).  He has had nausea and vomiting with at least one episode of diarrhea in the past 24 hours.  He has had decreased appetite and increased weakness.  No abdominal pain.  No chest pain or shortness of breath.  No new episodes of syncope or loss of consciousness.  He is bedbound at baseline.  ED Course: U/A consistent with infection, TNTC WBCs still present and yeast are present.  He has a leukocytosis and evidence of AKI with BUN 55 and Creatinine 4.29.  He has chronic anemia; hgb down slightly from last discharge.  Lactic acid is pending.  IV Rocephin given in the ED.  1.5L NS bolus given.  Urine and blood cultures pending.  Hospitalist asked to admit.  Review of Systems: As per HPI otherwise 10 point review of systems negative.    Past Medical History  Diagnosis Date  . Orthostatic lightheadedness   . History of subdural hematoma     LEFT CHRONIC SUBDURAL HEMATOMA   S/P PARIETAL CRANIOTOMY WITH EVACUATION HEMATOMA  . Atrial fibrillation, persistent ([place])   . H/O cardiac radiofrequency ablation     A-FLUTTER  . Cardiac pacemaker last check 08-30-2013    05-05-2007  DD  MEDTRONIC GUIDANT INSIGNIA  . RBBB   . Benign essential tremor   . Diverticulosis of  colon   . Tachy-brady syndrome ([place])   . SSS (sick sinus syndrome) ([place])   . Hydronephrosis, bilateral   . Renal insufficiency   . Recurrent prostate carcinoma ([place])     2005  S/P RADIOACTIVE SEED IMPLANTS/   BLADDER NECK MASS RESECTED 05/ 2015  SHOWED RECURRENT PROSTATE CANCER  . CHF (congestive heart failure) ([place])   . Anemia     Past Surgical History  Procedure Laterality Date  . Tonsillectomy  as child  . Subdural hematoma evacuation via craniotomy  12-16-2007  . Rotator cuff repair w/ distal clavicle excision Left 08-31-2010  . Pacemaker insertion  05-05-2007      Guidant Insignia [employer])  . Total knee arthroplasty  12/18/2010    Procedure: TOTAL KNEE ARTHROPLASTY;  Surgeon: Mauri Pole;  Location: [facility];  Service: Orthopedics;  Laterality: Left;  . Cataract extraction    . Cystoscopy w/ ureteral stent placement Bilateral 05/30/2013    Procedure: CYSTOSCOPY WITH RETROGRADE PYELOGRAM/ BILATERAL URETERAL STENT PLACEMENT, TRANSURETRAL RESECTION OF BLADDER TUMOR WITH GYRUS;  Surgeon: Alexis Frock, MD;  Location: [facility];  Service: Urology;  Laterality: Bilateral;  . Cardioversion N/A 08/04/2013    Procedure: CARDIOVERSION;  Surgeon: Sanda Klein, MD;  [facility];  Service: Cardiovascular;  Laterality: N/A;  . Knee arthroscopy Left 02-11-2007  . Cardioversion  multiple--  last one 08-04-2013    SINCE 2008  . Cardiac electrophysiology mapping  and ablation  04-06-2007  dr Caryl Comes  . Cardiac catheterization  12-22-2006      non-obstructive CAD / RCA 20%/  ef 50%/  a-fib  . Radioactive prostate seed implants  12-21-2003  . Orif left distal clavical fx  08-31-2010  . Colonoscopy w/ polypectomy  12-01-2008  . Transthoracic echocardiogram  05-31-2013    MILD LVH/  EF 65%/  MODERATE LAE/  TRIVIAL MR  &  TR  . Cystoscopy/retrograde/ureteroscopy Right 09/16/2013    Procedure: CYSTOSCOPY/RETROGRADE/URETEROSCOPIC STENT EXTRACTION/  bil retrograde pyelogram with inseertion of  bilateral stents;  Surgeon: Jorja Loa, MD;  Location[facility];  Service: Urology;  Laterality: Right;  . Cystoscopy w/ ureteral stent placement Bilateral 10/10/2014    Procedure: CYSTOSCOPY, WITH STENT REPLACEMENT, RIGHT URETEROSCOPY ;  Surgeon: Franchot Gallo, MD;  Location: [facility];  Service: Urology;  Laterality: Bilateral;     reports that he has never smoked. He has never used smokeless tobacco. He reports that he drinks about 12.6 oz of alcohol per week. He reports that he does not use illicit drugs.  No Known Allergies  Family History  Problem Relation Age of Onset  . Aneurysm Father   . Stroke Father      Prior to Admission medications   Medication Sig Start Date End Date Taking? Authorizing Provider  aspirin EC 81 MG tablet Take 81 mg by mouth daily.   Yes Historical Provider, MD  donepezil (ARICEPT) 23 MG TABS tablet Take 23 mg by mouth at bedtime.   Yes Historical Provider, MD  levothyroxine (SYNTHROID, LEVOTHROID) 137 MCG tablet Take 137 mcg by mouth daily before breakfast.   Yes Historical Provider, MD  megestrol (MEGACE) 20 MG tablet Take 20 mg by mouth 2 (two) times daily.    Yes Historical Provider, MD  Melatonin 5 MG CAPS Take 5 mg by mouth at bedtime.   Yes Historical Provider, MD  mirabegron ER (MYRBETRIQ) 25 MG TB24 tablet Take 25 mg by mouth daily.   Yes Historical Provider, MD  Multiple Vitamin (MULTIVITAMIN WITH MINERALS) TABS tablet Take 1 tablet by mouth daily.   Yes Historical Provider, MD  pramipexole (MIRAPEX) 0.25 MG tablet Take 0.25 mg by mouth at bedtime. 01/27/15  Yes Historical Provider, MD  Probiotic Product (PROBIOTIC PO) Take 1 tablet by mouth daily.   Yes Historical Provider, MD  propafenone (RYTHMOL) 225 MG tablet Take 225 mg by mouth 2 (two) times daily.    Yes Historical Provider, MD  propranolol ER (INDERAL LA) 60 MG 24 hr capsule Take 60 mg by mouth daily. 03/27/15  Yes Historical Provider, MD  tiZANidine (ZANAFLEX) 2  MG tablet Take 2 mg by mouth every 8 (eight) hours as needed for muscle spasms.   Yes Historical Provider, MD  zolpidem (AMBIEN) 10 MG tablet Take 10 mg by mouth at bedtime as needed for sleep. Reported on 06/29/2015 03/29/14  Yes Historical Provider, MD  ciprofloxacin (CIPRO) 250 MG tablet Take 250 mg by mouth 2 (two) times daily. Reported on 06/30/2015 06/21/15   Historical Provider, MD    Physical Exam: Filed Vitals:   06/30/15 1542 06/30/15 1808  BP: 127/58 126/53  Pulse: 59 56  Temp: 98.5 F (36.9 C)   TempSrc: Oral   Resp: 20 19  Height: 6\' 1"  (1.854 m)   Weight: 77.111 kg (170 lb)   SpO2: 95% 100%      Constitutional: NAD, lethargic but arousable, chronically ill appearing Filed Vitals:   06/30/15 1542 06/30/15  1808  BP: 127/58 126/53  Pulse: 59 56  Temp: 98.5 F (36.9 C)   TempSrc: Oral   Resp: 20 19  Height: 6\' 1"  (1.854 m)   Weight: 77.111 kg (170 lb)   SpO2: 95% 100%   Eyes: PERRL, lids and conjunctivae normal ENMT: Mucous membranes are slightly dry. Posterior pharynx clear of any exudate or lesions.Normal dentition.  Neck: normal, supple Respiratory: clear to auscultation bilaterally listening anteriorly, no wheezing, no crackles. Normal respiratory effort. No accessory muscle use.  Cardiovascular: Regular rate and rhythm, no murmurs / rubs / gallops. No extremity edema. 2+ pedal pulses.  Abdomen: soft, no distention, no tenderness, no masses palpated. Bowel sounds positive.  Musculoskeletal: no clubbing / cyanosis. No joint deformity upper and lower extremities. No contractures. Normal muscle tone.  Skin: no rashes, pale, cool Neurologic: No focal deficits Psychiatric: Normal judgment and insight. Oriented x 3. Normal mood.     Labs on Admission: I have personally reviewed following labs and imaging studies  CBC:  Recent Labs Lab 06/30/15 1613  WBC 14.2*  HGB 9.5*  HCT 28.2*  MCV 86.0  PLT 99991111   Basic Metabolic Panel:  Recent Labs Lab  06/30/15 1613  NA 130*  K 4.7  CL 104  CO2 17*  GLUCOSE 133*  BUN 55*  CREATININE 4.29*  CALCIUM 8.7*   GFR: Estimated Creatinine Clearance: 14.7 mL/min (by C-G formula based on Cr of 4.29). Liver Function Tests:  Recent Labs Lab 06/30/15 1613  AST 14*  ALT 11*  ALKPHOS 50  BILITOT 0.6  PROT 7.1  ALBUMIN 3.1*   CBG:  Recent Labs Lab 06/30/15 1603  GLUCAP 131*   Urine analysis:    Component Value Date/Time   COLORURINE AMBER* 06/30/2015 1709   APPEARANCEUR TURBID* 06/30/2015 1709   LABSPEC 1.014 06/30/2015 1709   PHURINE 6.0 06/30/2015 1709   GLUCOSEU NEGATIVE 06/30/2015 1709   HGBUR LARGE* 06/30/2015 1709   BILIRUBINUR NEGATIVE 06/30/2015 1709   KETONESUR NEGATIVE 06/30/2015 1709   PROTEINUR 100* 06/30/2015 1709   UROBILINOGEN 0.2 10/01/2014 2303   NITRITE NEGATIVE 06/30/2015 1709   LEUKOCYTESUR LARGE* 06/30/2015 1709   Sepsis Labs:  Lactic acid pending  EKG: Independently reviewed. NSR, RBBB (previously documented)  Assessment/Plan Principal Problem:   AKI (acute kidney injury) ([place]) Active Problems:   ATRIAL FIBRILLATION-paroxysmal   Pacemaker-dual-chamber-[employer]   UTI (lower urinary tract infection)   Sepsis ([place])   Anemia in chronic kidney disease (CKD)    Sepsis with AKI on CKD 3, secondary to UTI --Due to history of chronic indwelling foley and recurrent hospitalization, I will treat with empiric cefepime (instead of Rocephin) while awaiting culture data. --Fluconazole 200mg  IV once time now for yeast present on U/A.  Change foley catheter now. --Blood and urine cultures pending --Renal ultrasound (history of hydronephrosis, bilateral ureteral stents); low threshold to call urology if abnormal --Hydrate with NS --Repeat BMP in the AM; if no improvement, consider renal consult as well  Acute on chronic anemia of CKD --Avoid blood thinners for now --Follow trend --No signs of gross blood loss --History of iron  deficiency  Atrial fibrillation --not anticoagulated at baseline --Continue propafenone, propranolol  Hypothyroidsim --Continue home dose of levothyroxine  Dementia --Continue home dose of aricept  DVT prophylaxis: SCDs for now (Hgb is down, risk of bleeding) Code Status: DNR/DNI Family Communication: Daughter at bedside at time of admission Disposition Plan: To be determined Consults called: NONE Admission status: Inpatient, telemetry   [facility]  MD [facility]   If 7PM-7AM, please contact night-coverage [email or web address] Password [email or web address]  06/30/2015, 9:18 PM

## 2015-07-01 ENCOUNTER — Inpatient Hospital Stay (HOSPITAL_COMMUNITY): Payer: PPO

## 2015-07-01 LAB — CBC
HCT: 27.2 % — ABNORMAL LOW (ref 39.0–52.0)
HEMOGLOBIN: 9 g/dL — AB (ref 13.0–17.0)
MCH: 28.8 pg (ref 26.0–34.0)
MCHC: 33.1 g/dL (ref 30.0–36.0)
MCV: 87.2 fL (ref 78.0–100.0)
PLATELETS: 233 10*3/uL (ref 150–400)
RBC: 3.12 MIL/uL — AB (ref 4.22–5.81)
RDW: 18 % — ABNORMAL HIGH (ref 11.5–15.5)
WBC: 11.7 10*3/uL — AB (ref 4.0–10.5)

## 2015-07-01 LAB — BASIC METABOLIC PANEL
ANION GAP: 9 (ref 5–15)
BUN: 56 mg/dL — AB (ref 6–20)
CHLORIDE: 107 mmol/L (ref 101–111)
CO2: 15 mmol/L — ABNORMAL LOW (ref 22–32)
Calcium: 8.7 mg/dL — ABNORMAL LOW (ref 8.9–10.3)
Creatinine, Ser: 4.38 mg/dL — ABNORMAL HIGH (ref 0.61–1.24)
GFR, EST AFRICAN AMERICAN: 13 mL/min — AB (ref >60)
GFR, EST NON AFRICAN AMERICAN: 11 mL/min — AB (ref >60)
Glucose, Bld: 107 mg/dL — ABNORMAL HIGH (ref 65–99)
POTASSIUM: 4.6 mmol/L (ref 3.5–5.1)
SODIUM: 131 mmol/L — AB (ref 135–145)

## 2015-07-01 LAB — PROTIME-INR
INR: 1.42 (ref 0.00–1.49)
PROTHROMBIN TIME: 16.9 s — AB (ref 11.6–15.2)

## 2015-07-01 LAB — APTT: aPTT: 39 seconds — ABNORMAL HIGH (ref 24–37)

## 2015-07-01 LAB — PROCALCITONIN: PROCALCITONIN: 0.48 ng/mL

## 2015-07-01 MED ORDER — ALUM & MAG HYDROXIDE-SIMETH 200-200-20 MG/5ML PO SUSP
30.0000 mL | ORAL | Status: DC | PRN
  Administered 2015-07-01: 30 mL via ORAL
  Filled 2015-07-01: qty 30

## 2015-07-01 MED ORDER — DEXTROSE 5 % IV SOLN
1.0000 g | INTRAVENOUS | Status: DC
  Administered 2015-07-02 – 2015-07-03 (×2): 1 g via INTRAVENOUS
  Filled 2015-07-01 (×2): qty 1

## 2015-07-01 NOTE — Progress Notes (Signed)
Pharmacy Antibiotic Note  [name] is a 82 y.o. male admitted on 06/30/2015 with Sepsis with AKI secondary to UTI    Patient presented with nausea, vomiting, diarrhea.  PMH includes AFib, CVA, CKD and prostate cancer with chronic indwelling foley catheter & ureteral stents.  Patient received Ceftriaxone x 1 dose in ED.  Upon admission, Pharmacy has been consulted for Cefepime dosing.    Plan:  Cefepime 2gm IV x 1 followed by 1gm IV q24h  F/U cultures/sensitivities  F/U renal function  Height: 6\' 1"  (185.4 cm) Weight: 159 lb 9.6 oz (72.394 kg) IBW/kg (Calculated) : 79.9  Temp (24hrs), Avg:98.1 F (36.7 C), Min:97.9 F (36.6 C), Max:98.5 F (36.9 C)   Recent Labs Lab 06/30/15 1613 06/30/15 2112  WBC 14.2*  --   CREATININE 4.29*  --   LATICACIDVEN  --  1.05    Estimated Creatinine Clearance: 13.8 mL/min (by C-G formula based on Cr of 4.29).    No Known Allergies  Antimicrobials this admission: 6/16 Ceftriaxone x 1 dose >> 6/16 6/17 Fluconazole x 1 dose 6/17 Cefepime >>  Dose adjustments this admission:   Microbiology results: 6/16 BCx: sent 6/17 UCx: sent  6/16 MRSA PCR: sent  Thank you for allowing pharmacy to be a part of this patient's care.  Everette Rank, PharmD 07/01/2015 12:25 AM

## 2015-07-01 NOTE — Consult Note (Signed)
Renal Service Consult Note [place] 07/01/2015 [place] Requesting Physician:  Dr Hollice Gong  Reason for Consult:  Acute on CRF HPI: The patient is a 82 y.o. year-old with history of prostate cancer, bilat ureteral obstruction w indwelling stents, recurrent UTI, parkinson's, chron debility/ bedbound x 6 mos presented w n/v/d for 2-3 days, dec'd appetite.  Recently finished abx for UTI.  Creat was 4.29 (baseline 1.5) so patient admitted .  ^WBC and sig pyuria on UA.  Started IV abx.  Creat no better today, asked to see for A/CRF.    Pt's daughter gives most of history.  Pt had prostate cancer rx with radiation seeds at age 70.  At age 65 the cancer returned with bilat kidney obstruction and AKI.  Had internal stents placed and creat improved from 5 > 1.5 , this was 2015.  Now the stents are exchanged periodically.  He has had nuemrous UTI's in the last couple of years.  A foley cath was placed late 2016 as well and this remains in place.  He has had decline in ambulation and hasn't walked for about 6 mos.    No fevers, chills, no CP, SOB, no abd pain.    Pt lives w his wife in [place] in a condo.  She is in reasonably good health.  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  Has 2 daughters.  Wife has some back problems.  One daughter moved to [place] last fall to help with father's care.  The other daughter moved in w him and his wife recently.  Daughter has a lot of questions about finding care for her father, she says people are saying that her mother "needs more help", however they can't afford to pay for SNF facility out of pocket.        ROS  denies CP  no joint pain   no HA  no blurry vision  no rash  no diarrhea  no nausea/ vomiting  no dysuria  no difficulty voiding  no change in urine color    Past Medical History  Past Medical History  Diagnosis Date  . Orthostatic lightheadedness   .  History of subdural hematoma     LEFT CHRONIC SUBDURAL HEMATOMA   S/P PARIETAL CRANIOTOMY WITH EVACUATION HEMATOMA  . Atrial fibrillation, persistent ([place])   . H/O cardiac radiofrequency ablation     A-FLUTTER  . Cardiac pacemaker last check 08-30-2013    05-05-2007  DD  [employer] GUIDANT INSIGNIA  . RBBB   . Benign essential tremor   . Diverticulosis of colon   . Tachy-brady syndrome ([place])   . SSS (sick sinus syndrome) ([place])   . Hydronephrosis, bilateral   . Renal insufficiency   . Recurrent prostate carcinoma ([place])     2005  S/P RADIOACTIVE SEED IMPLANTS/   BLADDER NECK MASS RESECTED 05/ 2015  SHOWED RECURRENT PROSTATE CANCER  . CHF (congestive heart failure) ([place])   . Anemia    Past Surgical History  Past Surgical History  Procedure Laterality Date  . Tonsillectomy  as child  . Subdural hematoma evacuation via craniotomy  12-16-2007  . Rotator cuff repair w/ distal clavicle excision Left 08-31-2010  . Pacemaker insertion  05-05-2007      Guidant Insignia [employer])  . Total knee arthroplasty  12/18/2010    Procedure: TOTAL KNEE ARTHROPLASTY;  Surgeon: Mauri Pole;  Location: [facility];  Service: Orthopedics;  Laterality: Left;  . Cataract extraction    . Cystoscopy w/ ureteral stent placement Bilateral 05/30/2013    Procedure: CYSTOSCOPY WITH RETROGRADE PYELOGRAM/ BILATERAL URETERAL STENT PLACEMENT, TRANSURETRAL RESECTION OF BLADDER TUMOR WITH GYRUS;  Surgeon: Alexis Frock, MD;  Location: [facility];  Service: Urology;  Laterality: Bilateral;  . Cardioversion N/A 08/04/2013    Procedure: CARDIOVERSION;  Surgeon: Sanda Klein, MD;  Location: [facility];  Service: Cardiovascular;  Laterality: N/A;  . Knee arthroscopy Left 02-11-2007  . Cardioversion  multiple--  last one 08-04-2013    SINCE 2008  . Cardiac electrophysiology mapping and ablation  04-06-2007  dr Caryl Comes  . Cardiac catheterization  12-22-2006      non-obstructive CAD / RCA 20%/  ef 50%/  a-fib  . Radioactive  prostate seed implants  12-21-2003  . Orif left distal clavical fx  08-31-2010  . Colonoscopy w/ polypectomy  12-01-2008  . Transthoracic echocardiogram  05-31-2013    MILD LVH/  EF 65%/  MODERATE LAE/  TRIVIAL MR  &  TR  . Cystoscopy/retrograde/ureteroscopy Right 09/16/2013    Procedure: CYSTOSCOPY/RETROGRADE/URETEROSCOPIC STENT EXTRACTION/  bil retrograde pyelogram with inseertion of bilateral stents;  Surgeon: Jorja Loa, MD;  Location: [facility];  Service: Urology;  Laterality: Right;  . Cystoscopy w/ ureteral stent placement Bilateral 10/10/2014    Procedure: CYSTOSCOPY, WITH STENT REPLACEMENT, RIGHT URETEROSCOPY ;  Surgeon: Franchot Gallo, MD;  Location: [facility];  Service: Urology;  Laterality: Bilateral;   Family History  Family History  Problem Relation Age of Onset  . Aneurysm Father   . Stroke Father    Social History  reports that he has never smoked. He has never used smokeless tobacco. He reports that he drinks about 12.6 oz of alcohol per week. He reports that he does not use illicit drugs. Allergies No Known Allergies Home medications Prior to Admission medications   Medication Sig Start Date End Date Taking? Authorizing Provider  aspirin EC 81 MG tablet Take 81 mg by mouth daily.   Yes Historical Provider, MD  donepezil (ARICEPT) 23 MG TABS tablet Take 23 mg by mouth at bedtime.   Yes Historical Provider, MD  levothyroxine (SYNTHROID, LEVOTHROID) 137 MCG tablet Take 137 mcg by mouth daily before breakfast.   Yes Historical Provider, MD  megestrol (MEGACE) 20 MG tablet Take 20 mg by mouth 2 (two) times daily.    Yes Historical Provider, MD  Melatonin 5 MG CAPS Take 5 mg by mouth at bedtime.   Yes Historical Provider, MD  mirabegron ER (MYRBETRIQ) 25 MG TB24 tablet Take 25 mg by mouth daily.   Yes Historical Provider, MD  Multiple Vitamin (MULTIVITAMIN WITH MINERALS) TABS tablet Take 1 tablet by mouth daily.   Yes Historical Provider, MD  pramipexole  (MIRAPEX) 0.25 MG tablet Take 0.25 mg by mouth at bedtime. 01/27/15  Yes Historical Provider, MD  Probiotic Product (PROBIOTIC PO) Take 1 tablet by mouth daily.   Yes Historical Provider, MD  propafenone (RYTHMOL) 225 MG tablet Take 225 mg by mouth 2 (two) times daily.    Yes Historical Provider, MD  propranolol ER (INDERAL LA) 60 MG 24 hr capsule Take 60 mg by mouth daily. 03/27/15  Yes Historical Provider, MD  tiZANidine (ZANAFLEX) 2 MG tablet Take 2 mg by mouth every 8 (eight) hours as needed for muscle spasms.   Yes Historical Provider, MD  zolpidem (AMBIEN) 10 MG tablet Take 10 mg by mouth at bedtime as needed for sleep.  Reported on 06/29/2015 03/29/14  Yes Historical Provider, MD  ciprofloxacin (CIPRO) 250 MG tablet Take 250 mg by mouth 2 (two) times daily. Reported on 06/30/2015 06/21/15   Historical Provider, MD   Liver Function Tests  Recent Labs Lab 06/30/15 1613  AST 14*  ALT 11*  ALKPHOS 50  BILITOT 0.6  PROT 7.1  ALBUMIN 3.1*   No results for input(s): LIPASE, AMYLASE in the last 168 hours. CBC  Recent Labs Lab 06/30/15 1613 07/01/15 0526  WBC 14.2* 11.7*  HGB 9.5* 9.0*  HCT 28.2* 27.2*  MCV 86.0 87.2  PLT 271 0000000   Basic Metabolic Panel  Recent Labs Lab 06/30/15 1613 07/01/15 0526  NA 130* 131*  K 4.7 4.6  CL 104 107  CO2 17* 15*  GLUCOSE 133* 107*  BUN 55* 56*  CREATININE 4.29* 4.38*  CALCIUM 8.7* 8.7*   Iron/TIBC/Ferritin/ %Sat    Component Value Date/Time   IRON 10* 12/08/2014 0200   IRON 39* 08/18/2014 0937   TIBC 223* 12/08/2014 0200   TIBC 216 08/18/2014 0937   FERRITIN 38 12/08/2014 0200   FERRITIN 270 08/18/2014 0937   IRONPCTSAT 4* 12/08/2014 0200   IRONPCTSAT 18* 08/18/2014 0937    Filed Vitals:   06/30/15 2135 06/30/15 2256 07/01/15 0551 07/01/15 1408  BP: 118/51 123/51 129/47 149/59  Pulse: 60 57 60 50  Temp: 98 F (36.7 C) 97.9 F (36.6 C) 98.1 F (36.7 C) 98.2 F (36.8 C)  TempSrc:  Oral Oral Oral  Resp: 18 24 20 18   Height:   6\' 1"  (1.854 m)    Weight:  72.394 kg (159 lb 9.6 oz)    SpO2: 95% 98% 100% 100%   Exam Gen chron ill elderly WM, sig tremors at rest of UE's and head No rash, cyanosis or gangrene Sclera anicteric, throat clear  No jvd or bruits Chest clear bilat RRR no MRG Abd soft ntnd no mass or ascites +bs GU normal male w foley in place MS no joint effusions or deformity Ext no LE or UE edema / no wounds or ulcers Neuro is lethargic, arouses easily, Ox 2.5, bedbound, gen weakness exp LE's  UA - turbid, many bact, tntc WBC, +yeast, 5-30 rbc Renal - 10.6/11.9 kidneys, bilat hydro mild-mod as prior  Na 131 K 4.6  Cr 4.38   BUN 56  CO2 15   WBC 11k  Hb 9  plt 233 INR 1.42  Assessment: 1  Acute on CKD 3 2  CKD 3, baseline creat 1.5- 1.8 3  UTI, recurrent 4  Chronic bilat hydro/ indwelling ureteral stents/ indwelling foley - due to prostate Ca, f/b urology 5  Vol depletion 6  Tremors , prob d/t parkinson's 7  Prost cancer 8  PPM/ hx afib 9  Hx SDH   Plan - continue IVF"s for now.    Kelly Splinter MD [facility] pager [phone]    cell [phone] 07/01/2015, 6:56 PM

## 2015-07-01 NOTE — Progress Notes (Signed)
PROGRESS NOTE  [name] [id number] DOB: Jan 24, 1931 DOA: 06/30/2015 PCP: [name], MD  HPI/Recap of past 24 hours: [name] is a 82 y.o. gentleman with a history of atrial fibrillation, sick sinus syndrome S/P PPM, CVA, CKD 3, and prostate cancer with chronic indwelling foley catheter and bilateral ureteral stents who is admitted for UTI after ER evaluation for weakness and lethargy.   He was admitted last night, started on empiric Cefepime and has no particular complaints this AM, resting comfortably this morning.  Assessment/Plan: Principal Problem:   AKI (acute kidney injury) ([place]) - CKD, he has bilateral stents, his Cr is still elevated, his baseline is about 1.5. I will consult Dr. Jonnie Finner of nephrology today. He had renal ultrasound which shows chronic and unchanged bilateral hydronephrosis. Active Problems:   ATRIAL FIBRILLATION-paroxysmal - cont Xarelto   Pacemaker-dual-chamber-[employer]   UTI (lower urinary tract infection) - empiric Cefepime, f/u blood and urine cx   Sepsis (HCC)   Anemia in chronic kidney disease (CKD)  Code Status: DNR   Family Communication: None present   Disposition Plan: Back to home in 2-3 days. PT consult as well.    Consultants: Renal   Procedures:  None   Antimicrobials:  Cefepime 616   Objective: Filed Vitals:   06/30/15 1808 06/30/15 2135 06/30/15 2256 07/01/15 0551  BP: 126/53 118/51 123/51 129/47  Pulse: 56 60 57 60  Temp:  98 F (36.7 C) 97.9 F (36.6 C) 98.1 F (36.7 C)  TempSrc:   Oral Oral  Resp: 19 18 24 20   Height:   6\' 1"  (1.854 m)   Weight:   72.394 kg (159 lb 9.6 oz)   SpO2: 100% 95% 98% 100%    Intake/Output Summary (Last 24 hours) at 07/01/15 1000 Last data filed at 07/01/15 0615  Gross per 24 hour  Intake    775 ml  Output    675 ml  Net    100 ml   Filed Weights   06/30/15 1542 06/30/15 2256  Weight: 77.111 kg (170 lb) 72.394 kg (159 lb 9.6 oz)     Exam: General:  Alert, oriented, calm, in no acute distress Eyes: pupils round and reactive to light and accomodation, clear sclerea Neck: supple, no masses, trachea mildline  Cardiovascular: RRR, no murmurs or rubs, no peripheral edema  Respiratory: clear to auscultation bilaterally, no wheezes, no crackles  Abdomen: soft, nontender, nondistended, normal bowel tones heard  Foley in place with clear yellow urine. Skin: dry, no rashes  Musculoskeletal: no joint effusions, normal range of motion  Psychiatric: appropriate affect, normal speech  Neurologic: extraocular muscles intact, clear speech, moving all extremities with intact sensorium    Data Reviewed: CBC:  Recent Labs Lab 06/30/15 1613 07/01/15 0526  WBC 14.2* 11.7*  HGB 9.5* 9.0*  HCT 28.2* 27.2*  MCV 86.0 87.2  PLT 271 0000000   Basic Metabolic Panel:  Recent Labs Lab 06/30/15 1613 07/01/15 0526  NA 130* 131*  K 4.7 4.6  CL 104 107  CO2 17* 15*  GLUCOSE 133* 107*  BUN 55* 56*  CREATININE 4.29* 4.38*  CALCIUM 8.7* 8.7*   GFR: Estimated Creatinine Clearance: 13.5 mL/min (by C-G formula based on Cr of 4.38). Liver Function Tests:  Recent Labs Lab 06/30/15 1613  AST 14*  ALT 11*  ALKPHOS 50  BILITOT 0.6  PROT 7.1  ALBUMIN 3.1*   No results for input(s): LIPASE, AMYLASE in the last 168 hours. No results for input(s):  AMMONIA in the last 168 hours. Coagulation Profile:  Recent Labs Lab 07/01/15 0017  INR 1.42   Cardiac Enzymes: No results for input(s): CKTOTAL, CKMB, CKMBINDEX, TROPONINI in the last 168 hours. BNP (last 3 results) No results for input(s): PROBNP in the last 8760 hours. HbA1C: No results for input(s): HGBA1C in the last 72 hours. CBG:  Recent Labs Lab 06/30/15 1603  GLUCAP 131*   Lipid Profile: No results for input(s): CHOL, HDL, LDLCALC, TRIG, CHOLHDL, LDLDIRECT in the last 72 hours. Thyroid Function Tests: No results for input(s): TSH, T4TOTAL, FREET4, T3FREE,  THYROIDAB in the last 72 hours. Anemia Panel: No results for input(s): VITAMINB12, FOLATE, FERRITIN, TIBC, IRON, RETICCTPCT in the last 72 hours. Urine analysis:    Component Value Date/Time   COLORURINE AMBER* 06/30/2015 1709   APPEARANCEUR TURBID* 06/30/2015 1709   LABSPEC 1.014 06/30/2015 1709   PHURINE 6.0 06/30/2015 1709   GLUCOSEU NEGATIVE 06/30/2015 1709   HGBUR LARGE* 06/30/2015 1709   BILIRUBINUR NEGATIVE 06/30/2015 1709   KETONESUR NEGATIVE 06/30/2015 1709   PROTEINUR 100* 06/30/2015 1709   UROBILINOGEN 0.2 10/01/2014 2303   NITRITE NEGATIVE 06/30/2015 1709   LEUKOCYTESUR LARGE* 06/30/2015 1709   Sepsis Labs: @LABRCNTIP (procalcitonin:4,lacticidven:4)  )No results found for this or any previous visit (from the past 240 hour(s)).    Studies: US Renal Port  07/01/2015  CLINICAL DATA:  Acute kidney injury. EXAM: RENAL / URINARY TRACT ULTRASOUND COMPLETE COMPARISON:  12/07/2014 FINDINGS: Right Kidney: Length: 11.9 cm. Diffuse parenchymal atrophy. Mild to moderate hydronephrosis, similar to prior study. No solid mass lesions identified. Left Kidney: Length: 10.6 cm. Diffuse parenchymal atrophy. Mild to moderate hydronephrosis, similar to prior study. Bladder: Foley catheter decompresses the bladder. IMPRESSION: Bilateral renal parenchymal atrophy. Bilateral hydronephrosis. Similar appearance to previous study. Foley catheter decompresses the bladder. Electronically Signed   By: Lucienne Capers M.D.   On: 07/01/2015 02:23    Scheduled Meds: . aspirin EC  81 mg Oral Daily  . [START ON 07/02/2015] ceFEPime (MAXIPIME) IV  1 g Intravenous Q24H  . donepezil  23 mg Oral QHS  . levothyroxine  137 mcg Oral QAC breakfast  . megestrol  20 mg Oral BID  . mirabegron ER  25 mg Oral Daily  . pramipexole  0.25 mg Oral QHS  . propafenone  225 mg Oral BID  . propranolol ER  60 mg Oral Daily  . sodium chloride flush  3 mL Intravenous Q12H    Continuous Infusions: . sodium chloride 100  mL/hr at 07/01/15 0843     LOS: 1 day   Time spent: 32 minutes  Mir Marry Guan, MD [facility] Pager [phone]  If 7PM-7AM, please contact night-coverage [email or web address] Password [email or web address] 07/01/2015, 10:00 AM

## 2015-07-02 LAB — CREATININE, URINE, RANDOM: CREATININE, URINE: 56.5 mg/dL

## 2015-07-02 LAB — BASIC METABOLIC PANEL
Anion gap: 7 (ref 5–15)
BUN: 57 mg/dL — AB (ref 6–20)
CHLORIDE: 112 mmol/L — AB (ref 101–111)
CO2: 13 mmol/L — AB (ref 22–32)
CREATININE: 4.13 mg/dL — AB (ref 0.61–1.24)
Calcium: 8.2 mg/dL — ABNORMAL LOW (ref 8.9–10.3)
GFR calc non Af Amer: 12 mL/min — ABNORMAL LOW (ref >60)
GFR, EST AFRICAN AMERICAN: 14 mL/min — AB (ref >60)
Glucose, Bld: 117 mg/dL — ABNORMAL HIGH (ref 65–99)
POTASSIUM: 4.4 mmol/L (ref 3.5–5.1)
Sodium: 132 mmol/L — ABNORMAL LOW (ref 135–145)

## 2015-07-02 LAB — CBC
HCT: 22.2 % — ABNORMAL LOW (ref 39.0–52.0)
Hemoglobin: 7.6 g/dL — ABNORMAL LOW (ref 13.0–17.0)
MCH: 29.5 pg (ref 26.0–34.0)
MCHC: 34.2 g/dL (ref 30.0–36.0)
MCV: 86 fL (ref 78.0–100.0)
Platelets: 228 10*3/uL (ref 150–400)
RBC: 2.58 MIL/uL — AB (ref 4.22–5.81)
RDW: 17.9 % — ABNORMAL HIGH (ref 11.5–15.5)
WBC: 8.9 10*3/uL (ref 4.0–10.5)

## 2015-07-02 LAB — SODIUM, URINE, RANDOM: Sodium, Ur: 46 mmol/L

## 2015-07-02 MED ORDER — CHLORPROMAZINE HCL 10 MG PO TABS
10.0000 mg | ORAL_TABLET | Freq: Once | ORAL | Status: AC
  Administered 2015-07-02: 10 mg via ORAL
  Filled 2015-07-02: qty 1

## 2015-07-02 NOTE — Progress Notes (Signed)
[facility] Progress Note   Subjective: good UOP yest, 2.6L in and 1.9 L out.  BP stable.  Creat down slightly at 4.13 (4.3 yest). Feeling better, tremors gone, hiccups not gone.   Filed Vitals:   07/01/15 0551 07/01/15 1408 07/01/15 2128 07/02/15 0514  BP: 129/47 149/59 133/49 118/51  Pulse: 60 50 52 60  Temp: 98.1 F (36.7 C) 98.2 F (36.8 C) 99.1 F (37.3 C) 98.8 F (37.1 C)  TempSrc: Oral Oral Oral Oral  Resp: 20 18 19 20   Height:      Weight:      SpO2: 100% 100% 100% 95%    Inpatient medications: . aspirin EC  81 mg Oral Daily  . ceFEPime (MAXIPIME) IV  1 g Intravenous Q24H  . donepezil  23 mg Oral QHS  . levothyroxine  137 mcg Oral QAC breakfast  . megestrol  20 mg Oral BID  . mirabegron ER  25 mg Oral Daily  . pramipexole  0.25 mg Oral QHS  . propafenone  225 mg Oral BID  . propranolol ER  60 mg Oral Daily  . sodium chloride flush  3 mL Intravenous Q12H   . sodium chloride 100 mL/hr at 07/02/15 0402   acetaminophen **OR** acetaminophen, alum & mag hydroxide-simeth, ondansetron **OR** ondansetron (ZOFRAN) IV  Exam: Gen chron ill elderly WM, pleasant , Ox 2 , "[place]", "1979" No jvd or bruits Chest clear bilat RRR no MRG Abd soft ntnd no mass or ascites +bs GU normal male w foley in place draining clear yellow urine in good amts MS no joint effusions or deformity Ext no LE or UE edema / no wounds or ulcers Neuro groggy but fully responsive, gen weak esp LE's  UA - turbid, many bact, tntc WBC, +yeast, 5-30 rbc Renal - 10.6/11.9 kidneys, bilat hydro mild-mod as prior study  Na 131 K 4.6 Cr 4.38 BUN 56 CO2 15 WBC 11k Hb 9 plt 233 INR 1.42  Assessment: 1 Acute on CKD 3 - creat down slightly today at 4.1.  Cont IVF"s. Not a dialysis candidate due to comorbidities and debility.  2 CKD 3, baseline creat 1.5- 1.8 3 UTI, recurrent 4 Chronic bilat hydro/ indwelling ureteral stents/ locally advance prost cancer - treated w  transurethral resection of bladder mass occluding ureteral orifices in May '15 5 Vol depletion 6 Tremors, acute/ chron, on dopamine agonist at home (pramipexole)- better 8 PPM/ hx afib 9 Hx SDH 10 Debility - bedbound x 6 mos, cared for at home by his wife and daughter  Plan - cont IVF   Kelly Splinter MD [place] pager [phone]    cell [phone] 07/02/2015, 5:55 AM    Recent Labs Lab 06/30/15 1613 07/01/15 0526 07/02/15 0457  NA 130* 131* 132*  K 4.7 4.6 4.4  CL 104 107 112*  CO2 17* 15* 13*  GLUCOSE 133* 107* 117*  BUN 55* 56* 57*  CREATININE 4.29* 4.38* 4.13*  CALCIUM 8.7* 8.7* 8.2*    Recent Labs Lab 06/30/15 1613  AST 14*  ALT 11*  ALKPHOS 50  BILITOT 0.6  PROT 7.1  ALBUMIN 3.1*    Recent Labs Lab 06/30/15 1613 07/01/15 0526 07/02/15 0457  WBC 14.2* 11.7* 8.9  HGB 9.5* 9.0* 7.6*  HCT 28.2* 27.2* 22.2*  MCV 86.0 87.2 86.0  PLT 271 233 228   Iron/TIBC/Ferritin/ %Sat    Component Value Date/Time   IRON 10* 12/08/2014 0200   IRON 39* 08/18/2014 [id number]  TIBC 223* 12/08/2014 0200   TIBC 216 08/18/2014 0937   FERRITIN 38 12/08/2014 0200   FERRITIN 270 08/18/2014 0937   IRONPCTSAT 4* 12/08/2014 0200   IRONPCTSAT 18* 08/18/2014 [id number]

## 2015-07-02 NOTE — Progress Notes (Signed)
PROGRESS NOTE  HARSHA [name] [id number] DOB: [birth year] DOA: 06/30/2015 PCP: [name], MD  HPI/Recap of past 24 hours: [name] is a 82 y.o. gentleman with a history of atrial fibrillation, sick sinus syndrome S/P PPM, CVA, CKD 3, and prostate cancer with chronic indwelling foley catheter and bilateral ureteral stents who is admitted for UTI after ER evaluation for weakness and lethargy.   He is resting comfortably this AM. Has no pain, nausea or other complaints.  Assessment/Plan: Principal Problem:   AKI (acute kidney injury) ([place]) - CKD, he has bilateral stents, his Cr is still elevated but improving, his baseline is about 1.5. Dr. Jonnie Finner of nephrology following. Note pt had renal ultrasound which shows chronic and unchanged bilateral hydronephrosis. Active Problems:   ATRIAL FIBRILLATION-paroxysmal - cont Xarelto   Pacemaker-dual-chamber-[employer]   UTI (lower urinary tract infection) - empiric Cefepime, f/u blood and urine cx   Sepsis (HCC)   Anemia in chronic kidney disease (CKD)  Code Status: DNR   Family Communication: None present   Disposition Plan: Back to home in 2-3 days as renal function improves. PT consult as well.    Consultants: Renal   Procedures:  None   Antimicrobials:  Cefepime 616   Objective: Filed Vitals:   07/01/15 0551 07/01/15 1408 07/01/15 2128 07/02/15 0514  BP: 129/47 149/59 133/49 118/51  Pulse: 60 50 52 60  Temp: 98.1 F (36.7 C) 98.2 F (36.8 C) 99.1 F (37.3 C) 98.8 F (37.1 C)  TempSrc: Oral Oral Oral Oral  Resp: 20 18 19 20   Height:      Weight:    76.431 kg (168 lb 8 oz)  SpO2: 100% 100% 100% 95%    Intake/Output Summary (Last 24 hours) at 07/02/15 1018 Last data filed at 07/02/15 0700  Gross per 24 hour  Intake   3565 ml  Output   1925 ml  Net   1640 ml   Filed Weights   06/30/15 1542 06/30/15 2256 07/02/15 0514  Weight: 77.111 kg (170 lb) 72.394 kg (159 lb 9.6 oz) 76.431 kg (168  lb 8 oz)    Exam: General:  Alert, oriented, calm, in no acute distress Eyes: pupils round and reactive to light and accomodation, clear sclerea Neck: supple, no masses, trachea mildline  Cardiovascular: RRR, no murmurs or rubs, no peripheral edema  Respiratory: clear to auscultation bilaterally, no wheezes, no crackles  Abdomen: soft, nontender, nondistended, normal bowel tones heard  Foley in place with clear yellow urine. Skin: dry, no rashes  Musculoskeletal: no joint effusions, normal range of motion  Psychiatric: appropriate affect, normal speech  Neurologic: extraocular muscles intact, clear speech, moving all extremities with intact sensorium    Data Reviewed: CBC:  Recent Labs Lab 06/30/15 1613 07/01/15 0526 07/02/15 0457  WBC 14.2* 11.7* 8.9  HGB 9.5* 9.0* 7.6*  HCT 28.2* 27.2* 22.2*  MCV 86.0 87.2 86.0  PLT 271 233 [id number]   Basic Metabolic Panel:  Recent Labs Lab 06/30/15 1613 07/01/15 0526 07/02/15 0457  NA 130* 131* 132*  K 4.7 4.6 4.4  CL 104 107 112*  CO2 17* 15* 13*  GLUCOSE 133* 107* 117*  BUN 55* 56* 57*  CREATININE 4.29* 4.38* 4.13*  CALCIUM 8.7* 8.7* 8.2*   GFR: Estimated Creatinine Clearance: 15.2 mL/min (by C-G formula based on Cr of 4.13). Liver Function Tests:  Recent Labs Lab 06/30/15 1613  AST 14*  ALT 11*  ALKPHOS 50  BILITOT 0.6  PROT 7.1  ALBUMIN 3.1*   No results for input(s): LIPASE, AMYLASE in the last 168 hours. No results for input(s): AMMONIA in the last 168 hours. Coagulation Profile:  Recent Labs Lab 07/01/15 0017  INR 1.42   Cardiac Enzymes: No results for input(s): CKTOTAL, CKMB, CKMBINDEX, TROPONINI in the last 168 hours. BNP (last 3 results) No results for input(s): PROBNP in the last 8760 hours. HbA1C: No results for input(s): HGBA1C in the last 72 hours. CBG:  Recent Labs Lab 06/30/15 1603  GLUCAP 131*   Lipid Profile: No results for input(s): CHOL, HDL, LDLCALC, TRIG, CHOLHDL, LDLDIRECT in the  last 72 hours. Thyroid Function Tests: No results for input(s): TSH, T4TOTAL, FREET4, T3FREE, THYROIDAB in the last 72 hours. Anemia Panel: No results for input(s): VITAMINB12, FOLATE, FERRITIN, TIBC, IRON, RETICCTPCT in the last 72 hours. Urine analysis:    Component Value Date/Time   COLORURINE AMBER* 06/30/2015 1709   APPEARANCEUR TURBID* 06/30/2015 1709   LABSPEC 1.014 06/30/2015 1709   PHURINE 6.0 06/30/2015 1709   GLUCOSEU NEGATIVE 06/30/2015 1709   HGBUR LARGE* 06/30/2015 1709   BILIRUBINUR NEGATIVE 06/30/2015 1709   KETONESUR NEGATIVE 06/30/2015 1709   PROTEINUR 100* 06/30/2015 1709   UROBILINOGEN 0.2 10/01/2014 2303   NITRITE NEGATIVE 06/30/2015 1709   LEUKOCYTESUR LARGE* 06/30/2015 1709   Sepsis Labs: @LABRCNTIP (procalcitonin:4,lacticidven:4)  ) Recent Results (from the past 240 hour(s))  Culture, blood (Routine X 2) w Reflex to ID Panel     Status: None (Preliminary result)   Collection Time: 06/30/15  8:58 PM  Result Value Ref Range Status   Specimen Description BLOOD LEFT HAND  Final   Special Requests BOTTLES DRAWN AEROBIC AND ANAEROBIC 5CC  Final   Culture   Final    NO GROWTH < 24 HOURS Performed at [facility]    Report Status PENDING  Incomplete  Culture, blood (Routine X 2) w Reflex to ID Panel     Status: None (Preliminary result)   Collection Time: 06/30/15  9:07 PM  Result Value Ref Range Status   Specimen Description BLOOD LEFT FOREARM  Final   Special Requests BOTTLES DRAWN AEROBIC AND ANAEROBIC 5CC  Final   Culture   Final    NO GROWTH < 24 HOURS Performed at [facility]    Report Status PENDING  Incomplete      Studies: No results found.  Scheduled Meds: . aspirin EC  81 mg Oral Daily  . ceFEPime (MAXIPIME) IV  1 g Intravenous Q24H  . donepezil  23 mg Oral QHS  . levothyroxine  137 mcg Oral QAC breakfast  . megestrol  20 mg Oral BID  . mirabegron ER  25 mg Oral Daily  . pramipexole  0.25 mg Oral QHS  . propafenone   225 mg Oral BID  . propranolol ER  60 mg Oral Daily  . sodium chloride flush  3 mL Intravenous Q12H    Continuous Infusions: . sodium chloride 100 mL/hr at 07/02/15 0402     LOS: 2 days   Time spent: 22 minutes  Jessalyn Hinojosa Marry Guan, MD [facility] Pager [phone]  If 7PM-7AM, please contact night-coverage [email or web address] Password [email or web address] 07/02/2015, 10:18 AM

## 2015-07-02 NOTE — Progress Notes (Signed)
Nutrition Brief Note  Patient identified on the Malnutrition Screening Tool (MST) Report  Wt Readings from Last 15 Encounters:  07/02/15 168 lb 8 oz (76.431 kg)  06/07/15 160 lb (72.576 kg)  05/04/15 159 lb 14.4 oz (72.53 kg)  04/17/15 170 lb (77.111 kg)  12/07/14 168 lb 6.9 oz (76.4 kg)  10/10/14 167 lb (75.751 kg)  10/03/14 167 lb (75.751 kg)  10/01/14 175 lb (79.379 kg)  09/07/14 175 lb (79.379 kg)  08/25/14 170 lb (77.111 kg)  07/21/14 175 lb (79.379 kg)  07/07/14 180 lb 8 oz (81.874 kg)  06/07/14 184 lb 1.6 oz (83.507 kg)  04/05/14 176 lb 3.2 oz (79.924 kg)  02/03/14 173 lb 12.8 oz (78.835 kg)    Body mass index is 22.24 kg/(m^2). Patient meets criteria for normal range based on current BMI.   Current diet order is Heart Healthy, patient is consuming approximately 50-80% of meals at this time. Labs and medications reviewed.   No nutrition interventions warranted at this time. If nutrition issues arise, please consult RD.   Clayton Bibles, MS, RD, LDN Pager: [phone] After Hours Pager: [phone]

## 2015-07-03 ENCOUNTER — Other Ambulatory Visit: Payer: Self-pay

## 2015-07-03 LAB — CBC
HEMATOCRIT: 21.5 % — AB (ref 39.0–52.0)
Hemoglobin: 7.3 g/dL — ABNORMAL LOW (ref 13.0–17.0)
MCH: 29.3 pg (ref 26.0–34.0)
MCHC: 34 g/dL (ref 30.0–36.0)
MCV: 86.3 fL (ref 78.0–100.0)
PLATELETS: 253 10*3/uL (ref 150–400)
RBC: 2.49 MIL/uL — ABNORMAL LOW (ref 4.22–5.81)
RDW: 17.9 % — AB (ref 11.5–15.5)
WBC: 7.6 10*3/uL (ref 4.0–10.5)

## 2015-07-03 LAB — BASIC METABOLIC PANEL
ANION GAP: 8 (ref 5–15)
BUN: 54 mg/dL — AB (ref 6–20)
CALCIUM: 8.3 mg/dL — AB (ref 8.9–10.3)
CO2: 12 mmol/L — AB (ref 22–32)
Chloride: 113 mmol/L — ABNORMAL HIGH (ref 101–111)
Creatinine, Ser: 3.93 mg/dL — ABNORMAL HIGH (ref 0.61–1.24)
GFR calc Af Amer: 15 mL/min — ABNORMAL LOW (ref >60)
GFR calc non Af Amer: 13 mL/min — ABNORMAL LOW (ref >60)
GLUCOSE: 82 mg/dL (ref 65–99)
Potassium: 4.5 mmol/L (ref 3.5–5.1)
Sodium: 133 mmol/L — ABNORMAL LOW (ref 135–145)

## 2015-07-03 MED ORDER — POLYETHYLENE GLYCOL 3350 17 G PO PACK
17.0000 g | PACK | Freq: Two times a day (BID) | ORAL | Status: DC
  Administered 2015-07-03 – 2015-07-07 (×7): 17 g via ORAL
  Filled 2015-07-03 (×9): qty 1

## 2015-07-03 MED ORDER — SODIUM ACETATE 2 MEQ/ML IV SOLN
INTRAVENOUS | Status: DC
  Administered 2015-07-03 – 2015-07-05 (×3): via INTRAVENOUS
  Filled 2015-07-03 (×10): qty 1000

## 2015-07-03 MED ORDER — FLUCONAZOLE IN SODIUM CHLORIDE 100-0.9 MG/50ML-% IV SOLN
100.0000 mg | INTRAVENOUS | Status: DC
Start: 2015-07-03 — End: 2015-07-06
  Administered 2015-07-03 – 2015-07-05 (×3): 100 mg via INTRAVENOUS
  Filled 2015-07-03 (×4): qty 50

## 2015-07-03 NOTE — Progress Notes (Signed)
PROGRESS NOTE  [name] [id number] DOB: [birth year] DOA: 06/30/2015 PCP: [name], MD UROLOGY: Dr. Diona Fanti Cardiology: Dr. Caryl Comes  Brief Narrative: 82 year old man chronic indwelling Foley catheter just completed 7 day course of ciprofloxacin for possible UTI, presented with nausea and vomiting and diarrhea, decreased appetite, increased weakness.  Assessment/Plan: 1. Possible sepsis secondary to fungal UTI. Renal ultrasound showed chronic and unchanged bilateral hydronephrosis. 2. Acute kidney injury superimposed on chronic kidney disease stage III, chronic indwelling Foley catheter. Not a dialysis candidate secondary to comorbidities and ability per nephrology. Slowly improving. 3. Atrial fibrillation, sick sinus syndrome, not anticoagulated at baseline, remains in sinus rhythm, sinus bradycardia 4. Anemia of chronic kidney disease III, stable 5. Chronic indwelling Foley catheter, bilateral ureteral stents for bilateral ureteral obstruction 6. Bedbound, tremors, treated with pramipexoled 7. Dementia   Appears stable. Renal function slightly improved. Hemodynamics stable, afebrile.  Continue Diflucan. Stop cefepime.  Management per nephrology.  DVT prophylaxis: SCDs Code Status: DNR Family Communication: daughter at bedside Disposition Plan: home  [name], MD  [facility] Direct contact: [phone] --Via [place]  --[email or web address]; password [email or web address]  7PM-7AM contact night coverage as above 07/03/2015, 6:02 PM  LOS: 3 days   Consultants:    Procedures:    Antimicrobials:  Cefepime 6/18 >> 6/19  Fluconazole 6/19 >>  HPI/Subjective: Feels ok, no pain, no complaints.  Objective: Filed Vitals:   07/02/15 2025 07/03/15 0458 07/03/15 1020 07/03/15 1550  BP: 131/55 120/52 122/55 156/59  Pulse: 46 49 48 47  Temp: 98.7 F (37.1 C) 98.6 F (37 C) 98.5 F (36.9 C) 98.3 F (36.8 C)  TempSrc: Oral Oral  Oral  Resp: 20 18 18 18    Height:      Weight:  78.472 kg (173 lb)    SpO2: 100% 97% 99% 99%    Intake/Output Summary (Last 24 hours) at 07/03/15 1802 Last data filed at 07/03/15 1702  Gross per 24 hour  Intake   2740 ml  Output   1250 ml  Net   1490 ml     Filed Weights   06/30/15 2256 07/02/15 0514 07/03/15 0458  Weight: 72.394 kg (159 lb 9.6 oz) 76.431 kg (168 lb 8 oz) 78.472 kg (173 lb)    Exam:    Constitutional:  . Appears calm and comfortable Respiratory:  . CTA bilaterally, no w/r/r.  . Respiratory effort normal. No retractions or accessory muscle use Cardiovascular:  . RRR, no m/r/g . No LE extremity edema   . Telemetry SB Psychiatric:  . judgement and insight appear normal . Mental status o Mood, affect appropriate  I have personally reviewed following labs and imaging studies:  BUN and creatinine slightly better, 54/3.93  Hemoglobin stable 7.3   EKG marked sinus cardia, right bundle branch block, no significant change compared to previous study except for decreased rate  Scheduled Meds: . aspirin EC  81 mg Oral Daily  . ceFEPime (MAXIPIME) IV  1 g Intravenous Q24H  . donepezil  23 mg Oral QHS  . fluconazole (DIFLUCAN) IV  100 mg Intravenous Q24H  . levothyroxine  137 mcg Oral QAC breakfast  . megestrol  20 mg Oral BID  . mirabegron ER  25 mg Oral Daily  . pramipexole  0.25 mg Oral QHS  . propafenone  225 mg Oral BID  . propranolol ER  60 mg Oral Daily  . sodium chloride flush  3 mL Intravenous Q12H   Continuous Infusions: . dextrose 5 % 1,000 mL  with sodium acetate 150 mEq infusion 75 mL/hr at 07/03/15 1801    Principal Problem:   AKI (acute kidney injury) ([place]) Active Problems:   ATRIAL FIBRILLATION-paroxysmal   Pacemaker-dual-chamber-[employer]   UTI (lower urinary tract infection)   Sepsis (HCC)   Anemia in chronic kidney disease (CKD)   LOS: 3 days   Time spent 25 minutes

## 2015-07-03 NOTE — Care Management Note (Signed)
Case Management Note  Patient Details  Name: [name] MRN: [id number] Date of Birth: [birth year]  Subjective/Objective: 82 y/o m admitted w/AKI. From home. Active w/[facility] rep Abby already following- HHRN/PT/OT/aide. PT cons-await recc.                   Action/Plan:d/c plan home.   Expected Discharge Date:   (unknown)               Expected Discharge Plan:  [place]  In-House Referral:     Discharge planning Services  CM Consult  Post Acute Care Choice:  Home Health (Active w/[facility]-HHRN/PT/OT/aide.) Choice offered to:  Patient  DME Arranged:    DME Agency:     HH Arranged:    [place] Agency:     Status of Service:  In process, will continue to follow  Medicare Important Message Given:    Date Medicare IM Given:    Medicare IM give by:    Date Additional Medicare IM Given:    Additional Medicare Important Message give by:     If discussed at [place] of Stay Meetings, dates discussed:    Additional Comments:  Dessa Phi, RN 07/03/2015, 1:56 PM

## 2015-07-03 NOTE — Progress Notes (Signed)
[facility] Progress Note   Subjective:  Only 650 ml UOP recorded for yesterday (1925 day before) Creatinine down just a bit    Filed Vitals:   07/02/15 2025 07/03/15 0458 07/03/15 1020 07/03/15 1550  BP: 131/55 120/52 122/55 156/59  Pulse: 46 49 48 47  Temp: 98.7 F (37.1 C) 98.6 F (37 C) 98.5 F (36.9 C) 98.3 F (36.8 C)  TempSrc: Oral Oral  Oral  Resp: 20 18 18 18   Height:      Weight:  78.472 kg (173 lb)    SpO2: 100% 97% 99% 99%    Physical Exam: BP 156/59 mmHg  Pulse 47  Temp(Src) 98.3 F (36.8 C) (Oral)  Resp 18  Ht 6\' 1"  (1.854 m)  Wt 78.472 kg (173 lb)  BMI 22.83 kg/m2  SpO2 99%  Gen chron ill elderly WM, pleasant "wondering if I'll get a good report today" Still has hiccups No discernible JVD Chest anteriorly clear S1S2 No S3 or rub Soft non tender abdomen Yellow cloudy urine in tubing with a lot of sediment No edema Generally weak and frail but awakens easily  UA - turbid, many bact, tntc WBC, +yeast, 5-30 rbc Renal - 10.6/11.9 kidneys, bilat hydro mi  Inpatient medications: . aspirin EC  81 mg Oral Daily  . ceFEPime (MAXIPIME) IV  1 g Intravenous Q24H  . donepezil  23 mg Oral QHS  . levothyroxine  137 mcg Oral QAC breakfast  . megestrol  20 mg Oral BID  . mirabegron ER  25 mg Oral Daily  . pramipexole  0.25 mg Oral QHS  . propafenone  225 mg Oral BID  . propranolol ER  60 mg Oral Daily  . sodium chloride flush  3 mL Intravenous Q12H   . sodium chloride 100 mL/hr at 07/02/15 2127   acetaminophen **OR** acetaminophen, alum & mag hydroxide-simeth, ondansetron **OR** ondansetron (ZOFRAN) IV    Recent Labs Lab 07/01/15 0526 07/02/15 0457 07/03/15 0440  NA 131* 132* 133*  K 4.6 4.4 4.5  CL 107 112* 113*  CO2 15* 13* 12*  GLUCOSE 107* 117* 82  BUN 56* 57* 54*  CREATININE 4.38* 4.13* 3.93*  CALCIUM 8.7* 8.2* 8.3*    Recent Labs Lab 06/30/15 1613  AST 14*  ALT 11*  ALKPHOS 50  BILITOT 0.6  PROT 7.1  ALBUMIN  3.1*    Recent Labs Lab 07/01/15 0526 07/02/15 0457 07/03/15 0440  WBC 11.7* 8.9 7.6  HGB 9.0* 7.6* 7.3*  HCT 27.2* 22.2* 21.5*  MCV 87.2 86.0 86.3  PLT 233 228 253    Assessment: 1. Acute on CKD 3 - creat down slightly today at 3.93.  Getting IVF NS at 100/hour. 3.7 liters + but not looking wet at this point. I think however should probably slow fluids down some. Would favor bicarb containing fluid at lower rate.  See orders. Not a dialysis candidate due to comorbidities and debility.  2. Metabolic acidosis - in part related to volume expansion. Gap is normal. See #1 3. Anemia - Hb has dropped with volume expansion. Check Fe studies. 4. CKD 3, baseline creat 1.5-1.8 (1.47 05/04/15) 5. UTI, recurrent - >100,000 colonies yeast. Primary team has not seen today. Will need to be on antifungal Rx. Diflucan ordered. 6. Chronic bilat hydro/ indwelling ureteral stents/ locally advance prost cancer - treated w transurethral resection of bladder mass occluding ureteral orifices in May '15 7. Vol depletion - I think this is mostly resolved so will slow fluids  down 8. PPM/ hx afib 9. Hx SDH 10. Debility - bedbound x 6 mos, cared for at home by his wife and daughter  Barry Maes, MD [place] [phone] Pager 07/03/2015, 4:13 PM

## 2015-07-04 ENCOUNTER — Other Ambulatory Visit: Payer: Self-pay

## 2015-07-04 LAB — CBC
HCT: 23.3 % — ABNORMAL LOW (ref 39.0–52.0)
HEMOGLOBIN: 8.1 g/dL — AB (ref 13.0–17.0)
MCH: 29.1 pg (ref 26.0–34.0)
MCHC: 34.8 g/dL (ref 30.0–36.0)
MCV: 83.8 fL (ref 78.0–100.0)
PLATELETS: 248 10*3/uL (ref 150–400)
RBC: 2.78 MIL/uL — AB (ref 4.22–5.81)
RDW: 17.5 % — ABNORMAL HIGH (ref 11.5–15.5)
WBC: 8.4 10*3/uL (ref 4.0–10.5)

## 2015-07-04 LAB — RENAL FUNCTION PANEL
ALBUMIN: 2.2 g/dL — AB (ref 3.5–5.0)
Anion gap: 7 (ref 5–15)
BUN: 51 mg/dL — AB (ref 6–20)
CO2: 17 mmol/L — ABNORMAL LOW (ref 22–32)
CREATININE: 3.53 mg/dL — AB (ref 0.61–1.24)
Calcium: 8.3 mg/dL — ABNORMAL LOW (ref 8.9–10.3)
Chloride: 111 mmol/L (ref 101–111)
GFR, EST AFRICAN AMERICAN: 17 mL/min — AB (ref >60)
GFR, EST NON AFRICAN AMERICAN: 15 mL/min — AB (ref >60)
Glucose, Bld: 92 mg/dL (ref 65–99)
Phosphorus: 3.2 mg/dL (ref 2.5–4.6)
Potassium: 4.2 mmol/L (ref 3.5–5.1)
Sodium: 135 mmol/L (ref 135–145)

## 2015-07-04 LAB — IRON AND TIBC
Iron: 17 ug/dL — ABNORMAL LOW (ref 45–182)
SATURATION RATIOS: 10 % — AB (ref 17.9–39.5)
TIBC: 174 ug/dL — ABNORMAL LOW (ref 250–450)
UIBC: 157 ug/dL

## 2015-07-04 LAB — URINE CULTURE: SPECIAL REQUESTS: NORMAL

## 2015-07-04 MED ORDER — FERUMOXYTOL INJECTION 510 MG/17 ML
510.0000 mg | INTRAVENOUS | Status: DC
  Administered 2015-07-04: 510 mg via INTRAVENOUS
  Filled 2015-07-04: qty 17

## 2015-07-04 NOTE — Progress Notes (Signed)
PROGRESS NOTE  [name] [id number] DOB: 12-Aug-1931 DOA: 06/30/2015 PCP: [name], MD UROLOGY: Dr. Diona Fanti Cardiology: Dr. Caryl Comes  Brief Narrative: 82 year old man chronic indwelling Foley catheter just completed 7 day course of ciprofloxacin for possible UTI, presented with nausea and vomiting and diarrhea, decreased appetite, increased weakness.  Assessment/Plan: 1. Sepsis secondary to fungal UTI. Renal ultrasound showed chronic and unchanged bilateral hydronephrosis. Clinically improving. 2. Acute kidney injury superimposed on chronic kidney disease stage III, chronic indwelling Foley catheter. Improving. Not a dialysis candidate secondary to comorbidities and ability per nephrology.   3. Atrial fibrillation, sick sinus syndrome, not anticoagulated at baseline, remains in sinus rhythm, sinus bradycardia 4. Anemia of chronic kidney disease III, stable. Receiving iron infusion today. 5. Chronic indwelling Foley catheter, bilateral ureteral stents for bilateral ureteral obstruction 6. Bedbound, tremors, treated with pramipexoled 7. Dementia   Appears stable. Renal function improved.    Continue Diflucan.   Management per nephrology.  DVT prophylaxis: SCDs Code Status: DNR Family Communication: wife at bedside Disposition Plan: home  [name], MD  [facility] Direct contact: [phone] --[address]ia [place]  --[email or web address]; password [email or web address]  7PM-7AM contact night coverage as above 07/04/2015, 5:16 PM  LOS: 4 days   Consultants:    Procedures:    Antimicrobials:  Cefepime 6/18 >> 6/19  Fluconazole 6/19 >>  HPI/Subjective: Feels fine.  Objective: Filed Vitals:   07/03/15 1550 07/03/15 2041 07/04/15 0339 07/04/15 1500  BP: 156/59 151/59 137/64 154/62  Pulse: 47 54 47 46  Temp: 98.3 F (36.8 C) 98.1 F (36.7 C) 98.4 F (36.9 C)   TempSrc: Oral Oral Oral   Resp: 18 18 18 18   Height:      Weight:   76.975 kg (169 lb 11.2 oz)    SpO2: 99% 100% 99% 99%    Intake/Output Summary (Last 24 hours) at 07/04/15 1716 Last data filed at 07/04/15 1510  Gross per 24 hour  Intake 1790.75 ml  Output   1825 ml  Net -34.25 ml     Filed Weights   07/02/15 0514 07/03/15 0458 07/04/15 0339  Weight: 76.431 kg (168 lb 8 oz) 78.472 kg (173 lb) 76.975 kg (169 lb 11.2 oz)    Exam: Constitutional:  . Appears calm and comfortable Respiratory:  . CTA bilaterally, no w/r/r.  . Respiratory effort normal. No retractions or accessory muscle use Cardiovascular:  . RRR, no m/r/g . No LE extremity edema   Psychiatric:  . Mental status o Mood, affect appropriate  I have personally reviewed following labs and imaging studies:  BUN and creatinine slightly better, 51/3.53  Hemoglobin stable 8.1   Scheduled Meds: . aspirin EC  81 mg Oral Daily  . donepezil  23 mg Oral QHS  . ferumoxytol  510 mg Intravenous Weekly  . fluconazole (DIFLUCAN) IV  100 mg Intravenous Q24H  . levothyroxine  137 mcg Oral QAC breakfast  . megestrol  20 mg Oral BID  . mirabegron ER  25 mg Oral Daily  . polyethylene glycol  17 g Oral BID  . pramipexole  0.25 mg Oral QHS  . propafenone  225 mg Oral BID  . propranolol ER  60 mg Oral Daily  . sodium chloride flush  3 mL Intravenous Q12H   Continuous Infusions: . dextrose 5 % 1,000 mL with sodium acetate 150 mEq infusion 75 mL/hr at 07/04/15 0941    Principal Problem:   AKI (acute kidney injury) ([place]) Active Problems:   ATRIAL FIBRILLATION-paroxysmal  Pacemaker-dual-chamber-[employer]   UTI (lower urinary tract infection)   Sepsis ([place])   Anemia in chronic kidney disease (CKD)   LOS: 4 days   Time spent 20 minutes

## 2015-07-04 NOTE — Consult Note (Signed)
   [facility] Inpatient Consult   07/04/2015  DEJA ITURRALDE 15-Feb-1931 [id number]   Patient is currently active with [facility] program for long-term disease management services. Made aware of hospital admission.  Patient will receive post hospital discharge transition of care calls and will be assessed for monthly home visits for assessments and for education. Spoke to inpatient [facility] aware patient is active with [facility]. Noted therapy recommends home health services. Also discussed that wife requesting assessment for wheelchair modifications while inpatient.   Please see chart review tab then notes in EPIC for further patient outreach details by [place].   Marthenia Rolling, MSN-Ed, RN,BSN [facility] Liaison [phone]

## 2015-07-04 NOTE — Care Management Note (Signed)
Case Management Note  Patient Details  Name: [name] MRN: [id number] Date of Birth: 1931/08/01  Subjective/Objective: Received call from [employer] CM about spokuse wanting reclining w/c. Spoke to spouse about w/c-she wants a high back reclining w/c to be able to transport patient to pcp appts.TC to our PT who explained the different w/c's, & the process for ordering.  Will ask attending for script for high back reclining w/c.  Will also provide spouse with listing of home visit pcp's. Will need PTAR @ d/c.                  Action/Plan:d/c plan home w/HHC/THN   Expected Discharge Date:   (unknown)               Expected Discharge Plan:  [place]  In-House Referral:     Discharge planning Services  CM Consult  Post Acute Care Choice:  [place] (Active w/[facility]-HHRN/PT/OT/aide.) Choice offered to:  Patient  DME Arranged:    DME Agency:     HH Arranged:    [place] Agency:     Status of Service:  In process, will continue to follow  If discussed at [place] of Stay Meetings, dates discussed:    Additional Comments:  Dessa Phi, RN 07/04/2015, 3:08 PM

## 2015-07-04 NOTE — Care Management Important Message (Signed)
Important Message  Patient Details  Name: [name] MRN: [id number] Date of Birth: 10/17/1931   Medicare Important Message Given:  Yes    Camillo Flaming 07/04/2015, 9:09 AMImportant Message  Patient Details  Name: [name] MRN: [id number] Date of Birth: 09-25-1931   Medicare Important Message Given:  Yes    Camillo Flaming 07/04/2015, 9:08 AM

## 2015-07-04 NOTE — Progress Notes (Signed)
[facility] Progress Note   Subjective:  Good UOP yesterday! 1725 out and improvement in renal function Changed to acetate containing fluids yesterday and acidosis is improving.  BP stable Hiccups worse according to daughter  Danley Danker Vitals:   07/03/15 1020 07/03/15 1550 07/03/15 2041 07/04/15 0339  BP: 122/55 156/59 151/59 137/64  Pulse: 48 47 54 47  Temp: 98.5 F (36.9 C) 98.3 F (36.8 C) 98.1 F (36.7 C) 98.4 F (36.9 C)  TempSrc:  Oral Oral Oral  Resp: 18 18 18 18   Height:      Weight:    76.975 kg (169 lb 11.2 oz)  SpO2: 99% 99% 100% 99%    Physical Exam: BP 137/64 mmHg  Pulse 47  Temp(Src) 98.4 F (36.9 C) (Oral)  Resp 18  Ht 6\' 1"  (1.854 m)  Wt 76.975 kg (169 lb 11.2 oz)  BMI 22.39 kg/m2  SpO2 99%  Pleasant, getting ready to each lunch Still has hiccups No discernible JVD Chest anteriorly clear S1S2 No S3 or rub Soft non tender abdomen Urine is visibly clearer in the tubing No edema Generally weak and frail but very pleasant   Inpatient medications: . aspirin EC  81 mg Oral Daily  . donepezil  23 mg Oral QHS  . fluconazole (DIFLUCAN) IV  100 mg Intravenous Q24H  . levothyroxine  137 mcg Oral QAC breakfast  . megestrol  20 mg Oral BID  . mirabegron ER  25 mg Oral Daily  . polyethylene glycol  17 g Oral BID  . pramipexole  0.25 mg Oral QHS  . propafenone  225 mg Oral BID  . propranolol ER  60 mg Oral Daily  . sodium chloride flush  3 mL Intravenous Q12H   . dextrose 5 % 1,000 mL with sodium acetate 150 mEq infusion 75 mL/hr at 07/04/15 0941   acetaminophen **OR** acetaminophen, alum & mag hydroxide-simeth, ondansetron **OR** ondansetron (ZOFRAN) IV    Recent Labs Lab 07/02/15 0457 07/03/15 0440 07/04/15 0524  NA 132* 133* 135  K 4.4 4.5 4.2  CL 112* 113* 111  CO2 13* 12* 17*  GLUCOSE 117* 82 92  BUN 57* 54* 51*  CREATININE 4.13* 3.93* 3.53*  CALCIUM 8.2* 8.3* 8.3*  PHOS  --   --  3.2    Recent Labs Lab  06/30/15 1613 07/04/15 0524  AST 14*  --   ALT 11*  --   ALKPHOS 50  --   BILITOT 0.6  --   PROT 7.1  --   ALBUMIN 3.1* 2.2*    Recent Labs Lab 07/02/15 0457 07/03/15 0440 07/04/15 0608  WBC 8.9 7.6 8.4  HGB 7.6* 7.3* 8.1*  HCT 22.2* 21.5* 23.3*  MCV 86.0 86.3 83.8  PLT 228 253 248   Lab Results  Component Value Date   IRON 17* 07/04/2015   TIBC 174* 07/04/2015   FERRITIN 38 12/08/2014  TSat 10%   Assessment: 1. Acute on CKD 3 - creat definitely trending down now.  IVF changed to isotonic acetate yesterday and rate slowed to 75/hour. Volume status stable. Not a dialysis candidate due to comorbidities and debility but it looks like we won't have to deal with that issue now.  2. Metabolic acidosis - in part related to volume expansion. Gap normal. CO2 improving with acetate containing fluids. Continue current fluids for now, recheck labs in the am 3. Anemia - Hb has dropped with volume expansion. Transferrin saturation is only 10%. Dose Feraheme.  4. CKD 3,  baseline creat 1.5-1.8 (1.47 05/04/15) 5. UTI, recurrent - >100,000 colonies yeast. IV diflucan started 6/19 6. Chronic bilat hydro/ indwelling ureteral stents/ locally advance prost cancer - treated w transurethral resection of bladder mass occluding ureteral orifices in May '15 7. Vol depletion -resolved. Fluids now for maintenance.  8. PPM/ hx afib 9. Hx SDH 10. Debility - bedbound x 6 mos, cared for at home   Jamal Maes, MD [facility] [phone] Pager 07/04/2015, 1:56 PM

## 2015-07-04 NOTE — Patient Outreach (Addendum)
[facility]) Care Management  07/04/2015  [name] Mar 19, 1931 [id number]  DME:  W/C Inbound voice message received from wife states she would like for patient to be assessed for W/C modification needs while In-patient due to patient unable to get to MD appointments with current W/C.    Plan: [facility]iaison, assigned [facility] community NP and [facility] SW notified.   Mariann Laster, RN, BSN, [facility], [place] Management Care Management Coordinator [phone] Direct [phone] Cell [phone] Office [phone] Fax [email or web address]

## 2015-07-04 NOTE — Progress Notes (Signed)
PT Screen Note  Patient Details Name: [name] MRN: [id number] DOB: February 05, 1931   Cancelled Treatment:    Reason Eval/Treat Not Completed: PT screened, no needs identified, will sign off. Per chart review, pt is total assist for ADLs/mobility at baseline. HHPT already following. Will defer PT eval to [facility]. Recommend pt resume [place] services at discharge. Please reorder if discharge plan changes. Thanks.    Weston Anna, MPT Pager: [phone]

## 2015-07-05 ENCOUNTER — Telehealth: Payer: Self-pay | Admitting: *Deleted

## 2015-07-05 ENCOUNTER — Other Ambulatory Visit: Payer: Self-pay | Admitting: Licensed Clinical Social Worker

## 2015-07-05 ENCOUNTER — Ambulatory Visit: Payer: Self-pay

## 2015-07-05 LAB — RENAL FUNCTION PANEL
Albumin: 2.3 g/dL — ABNORMAL LOW (ref 3.5–5.0)
Anion gap: 6 (ref 5–15)
BUN: 44 mg/dL — AB (ref 6–20)
CALCIUM: 8.2 mg/dL — AB (ref 8.9–10.3)
CHLORIDE: 107 mmol/L (ref 101–111)
CO2: 23 mmol/L (ref 22–32)
CREATININE: 2.89 mg/dL — AB (ref 0.61–1.24)
GFR, EST AFRICAN AMERICAN: 22 mL/min — AB (ref >60)
GFR, EST NON AFRICAN AMERICAN: 19 mL/min — AB (ref >60)
Glucose, Bld: 100 mg/dL — ABNORMAL HIGH (ref 65–99)
Phosphorus: 2.7 mg/dL (ref 2.5–4.6)
Potassium: 3.9 mmol/L (ref 3.5–5.1)
SODIUM: 136 mmol/L (ref 135–145)

## 2015-07-05 LAB — CULTURE, BLOOD (ROUTINE X 2)
CULTURE: NO GROWTH
Culture: NO GROWTH

## 2015-07-05 LAB — CBC
HCT: 22.1 % — ABNORMAL LOW (ref 39.0–52.0)
Hemoglobin: 7.8 g/dL — ABNORMAL LOW (ref 13.0–17.0)
MCH: 29.9 pg (ref 26.0–34.0)
MCHC: 35.3 g/dL (ref 30.0–36.0)
MCV: 84.7 fL (ref 78.0–100.0)
PLATELETS: 280 10*3/uL (ref 150–400)
RBC: 2.61 MIL/uL — AB (ref 4.22–5.81)
RDW: 17.7 % — AB (ref 11.5–15.5)
WBC: 7.7 10*3/uL (ref 4.0–10.5)

## 2015-07-05 NOTE — Progress Notes (Signed)
PROGRESS NOTE  [name] [id number] DOB: February 26, 1931 DOA: 06/30/2015 PCP: [name], MD UROLOGY: Dr. Diona Fanti Cardiology: Dr. Caryl Comes  Brief Narrative: 82 year old man chronic indwelling Foley catheter just completed 7 day course of ciprofloxacin for possible UTI, presented with nausea and vomiting and diarrhea, decreased appetite, increased weakness.  Assessment/Plan: 1. Sepsis secondary to fungal UTI. Clinically resolved. Afebrile. Hemodynamics stable. Renal ultrasound with chronic findings, unchanged bilateral hydronephrosis. 2. Acute kidney injury superimposed on chronic kidney disease stage III, improving. Management per nephrology. 3. Anemia of chronic kidney disease, stable. Had iron infusion yesterday. 4. Chronic indwelling Foley catheter, bilateral ureteral stents for bilateral ureteral obstruction 5. Bedbound, tremors, treated with pramipexole 6. Dementia   Overall continues to improve. Continue Diflucan. Management per nephrology.  If continues to improve anticipate discharge next 48 hours.  DVT prophylaxis: SCDs Code Status: DNR Family Communication: wife at bedside Disposition Plan: home  Barry Hodgkins, MD  [facility] Direct contact: [phone] --[address]ia [place]  --[email or web address]; password [email or web address]  7PM-7AM contact night coverage as above 07/05/2015, 2:11 PM  LOS: 5 days   Consultants:    Procedures:    Antimicrobials:  Cefepime 6/18 >> 6/19  Fluconazole 6/19 >>  HPI/Subjective: Left sick upset today. Poor appetite secondary to food but otherwise doing well.  Objective: Filed Vitals:   07/04/15 0339 07/04/15 1500 07/04/15 2228 07/05/15 0536  BP: 137/64 154/62 152/80 148/58  Pulse: 47 46 48 49  Temp: 98.4 F (36.9 C)  98.6 F (37 C) 98.4 F (36.9 C)  TempSrc: Oral  Oral Oral  Resp: 18 18 18 20   Height:      Weight: 76.975 kg (169 lb 11.2 oz)   77.61 kg (171 lb 1.6 oz)  SpO2: 99% 99% 97% 95%    Intake/Output  Summary (Last 24 hours) at 07/05/15 1411 Last data filed at 07/05/15 0849  Gross per 24 hour  Intake   1157 ml  Output   2450 ml  Net  -1293 ml     Filed Weights   07/03/15 0458 07/04/15 0339 07/05/15 0536  Weight: 78.472 kg (173 lb) 76.975 kg (169 lb 11.2 oz) 77.61 kg (171 lb 1.6 oz)    Exam: Constitutional:  . Appears calm and comfortable Respiratory:  . CTA bilaterally, no w/r/r.  . Respiratory effort normal. No retractions or accessory muscle use Cardiovascular:  . RRR, no m/r/g . No LE extremity edema   Psychiatric:  . Mental status o Mood, affect appropriate   I have personally reviewed following labs and imaging studies:  BUN and creatinine Trending down 44/2.9  Hemoglobin stable 7.8  Scheduled Meds: . aspirin EC  81 mg Oral Daily  . donepezil  23 mg Oral QHS  . ferumoxytol  510 mg Intravenous Weekly  . fluconazole (DIFLUCAN) IV  100 mg Intravenous Q24H  . levothyroxine  137 mcg Oral QAC breakfast  . megestrol  20 mg Oral BID  . mirabegron ER  25 mg Oral Daily  . polyethylene glycol  17 g Oral BID  . pramipexole  0.25 mg Oral QHS  . propafenone  225 mg Oral BID  . propranolol ER  60 mg Oral Daily  . sodium chloride flush  3 mL Intravenous Q12H   Continuous Infusions: . dextrose 5 % 1,000 mL with sodium acetate 150 mEq infusion 75 mL/hr at 07/05/15 0042    Principal Problem:   AKI (acute kidney injury) [facility]) Active Problems:   ATRIAL FIBRILLATION-paroxysmal   Pacemaker-dual-chamber-[employer]   UTI (  lower urinary tract infection)   Sepsis ([place])   Anemia in chronic kidney disease (CKD)   LOS: 5 days   Time spent 20 minutes

## 2015-07-05 NOTE — Telephone Encounter (Signed)
Patients wife came into office today concerning a form for the [place]. Pt stated her husband is at [place] wife is trying to get some more [employer] benefits for her husband. Pt was last seen January 2017 by Dr.Sethi and has no follow up appt. Rn stated she will need to schedule another appt with Dr. Leonie Man to have her husband evaluate. Pts wife stated her husband cannot come, because he is unable to sit in a wheelchair. Pts wife stated the hospital MD is unable to fill out the form. Pts wife stated the hospital MD recommend Dr. Leonie Man fill the form out because he has been coming her for the last couple of years. Pt stated her husband cannot see the PCP to have the form fill out.Pts wife stated a NP comes to the house from [facility] to see her husband. RN recommend pts wife speak with the NP about having a MD to evaluate her husband and the form. PTs wife stated she knows about the MD who does house calls, because there is a 200 to 250 charge. Rn recommend calling her husbands insurance to see if they have any other services for house MDs. Rn stated she can schedule an appt, but later decline.

## 2015-07-05 NOTE — Progress Notes (Signed)
[facility] Progress Note   Subjective:  Continues with excellent UOP, falling creatinine Wife in room and questions answered  Filed Vitals:   07/04/15 0339 07/04/15 1500 07/04/15 2228 07/05/15 0536  BP: 137/64 154/62 152/80 148/58  Pulse: 47 46 48 49  Temp: 98.4 F (36.9 C)  98.6 F (37 C) 98.4 F (36.9 C)  TempSrc: Oral  Oral Oral  Resp: 18 18 18 20   Height:      Weight: 76.975 kg (169 lb 11.2 oz)   77.61 kg (171 lb 1.6 oz)  SpO2: 99% 99% 97% 95%    Physical Exam: BP 148/58 mmHg  Pulse 49  Temp(Src) 98.4 F (36.9 C) (Oral)  Resp 20  Ht 6\' 1"  (1.854 m)  Wt 77.61 kg (171 lb 1.6 oz)  BMI 22.58 kg/m2  SpO2 95%  Pleasant pale and generally frail appearing WM Seems much more alert and animated today No JVD Chest anteriorly clear S1S2 No S3 or rub Soft non tender abdomen Urine clearer in foley tubing No edema  Inpatient medications: . aspirin EC  81 mg Oral Daily  . donepezil  23 mg Oral QHS  . ferumoxytol  510 mg Intravenous Weekly  . fluconazole (DIFLUCAN) IV  100 mg Intravenous Q24H  . levothyroxine  137 mcg Oral QAC breakfast  . megestrol  20 mg Oral BID  . mirabegron ER  25 mg Oral Daily  . polyethylene glycol  17 g Oral BID  . pramipexole  0.25 mg Oral QHS  . propafenone  225 mg Oral BID  . propranolol ER  60 mg Oral Daily  . sodium chloride flush  3 mL Intravenous Q12H   . dextrose 5 % 1,000 mL with sodium acetate 150 mEq infusion 75 mL/hr at 07/05/15 0042   acetaminophen **OR** acetaminophen, alum & mag hydroxide-simeth, ondansetron **OR** ondansetron (ZOFRAN) IV    Recent Labs Lab 07/03/15 0440 07/04/15 0524 07/05/15 0418  NA 133* 135 136  K 4.5 4.2 3.9  CL 113* 111 107  CO2 12* 17* 23  GLUCOSE 82 92 100*  BUN 54* 51* 44*  CREATININE 3.93* 3.53* 2.89*  CALCIUM 8.3* 8.3* 8.2*  PHOS  --  3.2 2.7    Recent Labs Lab 06/30/15 1613 07/04/15 0524 07/05/15 0418  AST 14*  --   --   ALT 11*  --   --   ALKPHOS 50  --   --    BILITOT 0.6  --   --   PROT 7.1  --   --   ALBUMIN 3.1* 2.2* 2.3*    Recent Labs Lab 07/03/15 0440 07/04/15 0608 07/05/15 0418  WBC 7.6 8.4 7.7  HGB 7.3* 8.1* 7.8*  HCT 21.5* 23.3* 22.1*  MCV 86.3 83.8 84.7  PLT 253 248 280   Lab Results  Component Value Date   IRON 17* 07/04/2015   TIBC 174* 07/04/2015   FERRITIN 38 12/08/2014  TSat 10%   Assessment: 1. Acute on CKD 3 - creat definitely trending down now.  I think euvolemic and could reduce IVF to [facility] until we see if he can keep up po (needs to take in about 2 liters/day).  Not a dialysis candidate due to comorbidities and debility but it looks like we won't have to deal with that issue now.  2. Metabolic acidosis - in part related to volume expansion. Gap normal. CO2 has normalized with acetate. Stop IV acetate. If CO2 drops tomorrow, can add back oral sodium bicarbonate.  3. Anemia - Hb has dropped with volume expansion. Transferrin saturation is 10%. Dosed Feraheme 510 6/20.  4. CKD 3, baseline creat 1.5-1.8 (1.47 05/04/15) - he's about halfway back to his baseline GFR 5. UTI, recurrent - >100,000 colonies yeast. IV diflucan started 6/19 6. Chronic bilat hydro/ indwelling ureteral stents/ locally advance prost cancer - treated w transurethral resection of bladder mass occluding ureteral orifices in May '15 7. Vol depletion -resolved.  8. PPM/ hx afib 9. Hx SDH 10. Debility - bedbound x 6 mos, cared for at home   Jamal Maes, MD [place] [phone] Pager 07/05/2015, 1:01 PM

## 2015-07-05 NOTE — Patient Outreach (Signed)
[facility]) Care Management  07/05/2015  [name] 09-02-1931 [id number]   Assessment- CSW has been made aware of patient's hospitalization by [facility]. Patient's spouse has contacted RNCM and would like for patient to be assessed for W/C modification while he is inpatient.  Plan-CSW will continue to follow case and will await for hospital discharge.  [name], BSW, MSW, [email or web address] Phone: [phone] Fax: [phone]  .

## 2015-07-06 LAB — RENAL FUNCTION PANEL
ALBUMIN: 2.4 g/dL — AB (ref 3.5–5.0)
Anion gap: 8 (ref 5–15)
BUN: 35 mg/dL — AB (ref 6–20)
CHLORIDE: 103 mmol/L (ref 101–111)
CO2: 24 mmol/L (ref 22–32)
Calcium: 8.3 mg/dL — ABNORMAL LOW (ref 8.9–10.3)
Creatinine, Ser: 2.58 mg/dL — ABNORMAL HIGH (ref 0.61–1.24)
GFR calc Af Amer: 25 mL/min — ABNORMAL LOW (ref >60)
GFR, EST NON AFRICAN AMERICAN: 22 mL/min — AB (ref >60)
Glucose, Bld: 87 mg/dL (ref 65–99)
POTASSIUM: 4.2 mmol/L (ref 3.5–5.1)
Phosphorus: 3.2 mg/dL (ref 2.5–4.6)
SODIUM: 135 mmol/L (ref 135–145)

## 2015-07-06 MED ORDER — FLUCONAZOLE 100 MG PO TABS
150.0000 mg | ORAL_TABLET | Freq: Every evening | ORAL | Status: DC
  Administered 2015-07-06 – 2015-07-07 (×2): 150 mg via ORAL
  Filled 2015-07-06 (×2): qty 2

## 2015-07-06 MED ORDER — BENZONATATE 100 MG PO CAPS: 200.0000 mg | ORAL_CAPSULE | Freq: Two times a day (BID) | ORAL | Status: DC | PRN

## 2015-07-06 NOTE — Progress Notes (Addendum)
PROGRESS NOTE  CORD STRAUGHAN [id number] DOB: January 08, 1932 DOA: 06/30/2015 PCP: [name], MD UROLOGY: Dr. Diona Fanti Cardiology: Dr. Caryl Comes  Brief Narrative: 82 year old man chronic indwelling Foley catheter just completed 7 day course of ciprofloxacin for possible UTI, presented with nausea and vomiting and diarrhea, decreased appetite, increased weakness.  Assessment/Plan: 1. Sepsis secondary to fungal UTI.  Remains afebrile Hemodynamics stable. Renal ultrasound with chronic findings, unchanged bilateral hydronephrosis. Appreciate urology recommendations. 2. Acute kidney injury superimposed on chronic kidney disease stage III. Management per nephrology. Continues to improve. 3. Anemia of chronic kidney disease, stable. Had iron infusion 2 days neuro 4. Chronic indwelling Foley catheter, bilateral ureteral stents for bilateral ureteral obstruction 5. Bedbound, tremors, treated with pramipexole 6. Dementia   Renal function continues to improve, potassium is normal, CO2 has normalized  Continues on Diflucan. Urine culture now shows 50,000 colonies enterococcus. Doubt pathogenic given the patient's clinical improvement, resolution of leukocytosis, remains afebrile. Will not treat.  Overall improving, likely home next 24 hours  Will transfuse 1 U PRBC per nephrology. D/w patient and wife who agree.  DVT prophylaxis: SCDs Code Status: DNR Family Communication:  Disposition Plan: home 6/23  [name], MD  [facility] Direct contact: [phone] --Via amion app OR  --[email or web address]; password [email or web address]  7PM-7AM contact night coverage as above 07/06/2015, 4:59 PM  LOS: 6 days   Consultants:    Procedures:    Antimicrobials:  Cefepime 6/18 >> 6/19  Fluconazole 6/19 >>  HPI/Subjective: Overall doing okay. Poor appetite. Hiccups seem to have resolved.  Objective: Filed Vitals:   07/05/15 0536 07/05/15 2021 07/06/15 0441 07/06/15 1348  BP: 148/58  150/67 164/67 166/74  Pulse: 49 49 48 50  Temp: 98.4 F (36.9 C) 97.5 F (36.4 C) 98.6 F (37 C) 98.2 F (36.8 C)  TempSrc: Oral Oral Oral Oral  Resp: 20 20 20 20   Height:      Weight: 77.61 kg (171 lb 1.6 oz)  78.019 kg (172 lb)   SpO2: 95% 95% 94% 99%    Intake/Output Summary (Last 24 hours) at 07/06/15 1659 Last data filed at 07/06/15 1309  Gross per 24 hour  Intake 2210.58 ml  Output   2101 ml  Net 109.58 ml     Filed Weights   07/04/15 0339 07/05/15 0536 07/06/15 0441  Weight: 76.975 kg (169 lb 11.2 oz) 77.61 kg (171 lb 1.6 oz) 78.019 kg (172 lb)    Exam: Constitutional:  Appears calm and comfortable, debilitated, weak Neck:  Poor tone, and has difficulty maintaining had in neutral position Respiratory:  CTA bilaterally, no w/r/r.  Respiratory effort normal. No retractions or accessory muscle use Cardiovascular:  RRR, no m/r/g No LE extremity edema   Musculoskeletal:   Abnormal RUE, LUE, RLE, LLE   strength and tone decreased, tremors all extremities especially with movement No tenderness, masses Range of movement decreased with generalized weakness, unable to sit up Psychiatric:  judgement and insight appear normal Mental status Mood, affect appropriate  I have personally reviewed following labs and imaging studies:  BUN and creatinine Trending down 35/2.58  Scheduled Meds: . aspirin EC  81 mg Oral Daily  . donepezil  23 mg Oral QHS  . ferumoxytol  510 mg Intravenous Weekly  . fluconazole (DIFLUCAN) IV  100 mg Intravenous Q24H  . levothyroxine  137 mcg Oral QAC breakfast  . megestrol  20 mg Oral BID  . mirabegron ER  25 mg Oral Daily  . polyethylene glycol  17 g Oral  BID  . pramipexole  0.25 mg Oral QHS  . propafenone  225 mg Oral BID  . propranolol ER  60 mg Oral Daily  . sodium chloride flush  3 mL Intravenous Q12H   Continuous Infusions: . dextrose 5 % 1,000 mL with sodium acetate 150 mEq infusion 10 mL/hr at 07/05/15 1319    Principal  Problem:   AKI (acute kidney injury) ([place]) Active Problems:   ATRIAL FIBRILLATION-paroxysmal   Pacemaker-dual-chamber-[employer]   UTI (lower urinary tract infection)   Sepsis (HCC)   Anemia in chronic kidney disease (CKD)   LOS: 6 days   Time spent 20 minutes

## 2015-07-06 NOTE — Care Management Note (Signed)
Case Management Note  Patient Details  Name: [name] MRN: [id number] Date of Birth: [birth year]  Subjective/Objective:Would recc-palliative cons if MD agree,                    Action/Plan:d/c plan home.   Expected Discharge Date:   (unknown)               Expected Discharge Plan:  [place]  In-House Referral:     Discharge planning Services  CM Consult  Post Acute Care Choice:  Home Health (Active w/[facility]-HHRN/PT/OT/aide.) Choice offered to:  Patient  DME Arranged:    DME Agency:     HH Arranged:    [place] Agency:     Status of Service:  In process, will continue to follow  If discussed at Long Length of Stay Meetings, dates discussed:    Additional Comments:  Dessa Phi, RN 07/06/2015, 9:25 AM

## 2015-07-06 NOTE — Consult Note (Signed)
   [facility] CM Inpatient Consult   07/06/2015  [name] 1931/02/03 [id number]   [name] is active with [facility] Management services. Spoke with inpatient RNCM who indicated patient's wife mentioned [facility]. Went to bedside to speak with wife and patient. However, [name] was asleep and his wife was not at bedside. Telephone call made to [facility] and spoke with Tammy. She reports that attempts were made in the past to contact patient/wife for [facility] program. However, calls were not returned. Referral made to Tammy for [facility] to please reach out to patient again for potential services with their program. Asked that [facility] make [facility] NP aware if they do indeed pick up [name] up for services. Will make inpatient RNCM aware of conversation and will also notify [facility] Management team as well.    Marthenia Rolling, MSN-Ed, RN,BSN [facility] Liaison [phone]

## 2015-07-06 NOTE — Care Management Note (Signed)
Case Management Note  Patient Details  Name: [name] MRN: [id number] Date of Birth: 05-23-1931  Subjective/Objective: Spoke to spouse about d/c plans-she states she has seen 2 palliative doctors, & has [facility] already following if she needs their service, she has [facility], & [facility]-she will follow on own with process for high back w/c-since a pcp, & PT should confirm the medical need. Informed her that the [place] has their own process for services, she will f/u with them. Will await HHC orders-HHRN/PT/OT/aide,social worker-[facility] rep Abby following. Will need PTAR non emergency @ d/c.                Action/Plan:d/c home w/HHC.   Expected Discharge Date:   (unknown)               Expected Discharge Plan:  [place]  In-House Referral:     Discharge planning Services  CM Consult  Post Acute Care Choice:  Home Health (Active w/[facility]-HHRN/PT/OT/aide.) Choice offered to:  Patient  DME Arranged:    DME Agency:     HH Arranged:    [place] Agency:     Status of Service:  In process, will continue to follow  If discussed at Long Length of Stay Meetings, dates discussed:    Additional Comments:  Dessa Phi, RN 07/06/2015, 12:04 PM

## 2015-07-06 NOTE — Progress Notes (Signed)
[facility] Progress Note   Subjective:  IVF to [facility] yesterday and wife advised yesterday needs to get in about 2 liters/day orally UOP remains excellent Not taking much  Po today however IV fluconazole Day 4 for >100,000 cols yeast Final culture + also for 50,000 colonies enterococcus that is pansensitive (cx was from 6/17)  Filed Vitals:   07/04/15 2228 07/05/15 0536 07/05/15 2021 07/06/15 0441  BP: 152/80 148/58 150/67 164/67  Pulse: 48 49 49 48  Temp: 98.6 F (37 C) 98.4 F (36.9 C) 97.5 F (36.4 C) 98.6 F (37 C)  TempSrc: Oral Oral Oral Oral  Resp: '18 20 20 20  '$ Height:      Weight:  77.61 kg (171 lb 1.6 oz)  78.019 kg (172 lb)  SpO2: 97% 95% 95% 94%    Physical Exam: BP 164/67 mmHg  Pulse 48  Temp(Src) 98.6 F (37 C) (Oral)  Resp 20  Ht '6\' 1"'$  (1.854 m)  Wt 78.019 kg (172 lb)  BMI 22.70 kg/m2  SpO2 94%  Pleasant pale and generally frail appearing WM Alert,  No JVD Chest anteriorly clear S1S2 No S3 or rub Soft non tender abdomen Urine clearer in foley tubing No edema  Inpatient medications: . aspirin EC  81 mg Oral Daily  . donepezil  23 mg Oral QHS  . ferumoxytol  510 mg Intravenous Weekly  . fluconazole (DIFLUCAN) IV  100 mg Intravenous Q24H  . levothyroxine  137 mcg Oral QAC breakfast  . megestrol  20 mg Oral BID  . mirabegron ER  25 mg Oral Daily  . polyethylene glycol  17 g Oral BID  . pramipexole  0.25 mg Oral QHS  . propafenone  225 mg Oral BID  . propranolol ER  60 mg Oral Daily  . sodium chloride flush  3 mL Intravenous Q12H   . dextrose 5 % 1,000 mL with sodium acetate 150 mEq infusion 10 mL/hr at 07/05/15 1319   acetaminophen **OR** acetaminophen, alum & mag hydroxide-simeth, ondansetron **OR** ondansetron (ZOFRAN) IV    Recent Labs Lab 07/04/15 0524 07/05/15 0418 07/06/15 0348  NA 135 136 135  K 4.2 3.9 4.2  CL 111 107 103  CO2 17* 23 24  GLUCOSE 92 100* 87  BUN 51* 44* 35*  CREATININE 3.53* 2.89* 2.58*   CALCIUM 8.3* 8.2* 8.3*  PHOS 3.2 2.7 3.2    Recent Labs Lab 06/30/15 1613 07/04/15 0524 07/05/15 0418 07/06/15 0348  AST 14*  --   --   --   ALT 11*  --   --   --   ALKPHOS 50  --   --   --   BILITOT 0.6  --   --   --   PROT 7.1  --   --   --   ALBUMIN 3.1* 2.2* 2.3* 2.4*    Recent Labs Lab 07/03/15 0440 07/04/15 0608 07/05/15 0418  WBC 7.6 8.4 7.7  HGB 7.3* 8.1* 7.8*  HCT 21.5* 23.3* 22.1*  MCV 86.3 83.8 84.7  PLT 253 248 280   Lab Results  Component Value Date   IRON 17* 07/04/2015   TIBC 174* 07/04/2015   FERRITIN 38 12/08/2014  TSat 10%   Assessment: 1. Acute on CKD 3 - creat continues to slowly fall though not back at baseline from April 2017 as of yet. Off IVF and able to take po.  To keep up po needs to be able to take in about 2 liters/day and has  not had much today so far.  Not a dialysis candidate due to comorbidities and debility but it looks like we won't have to deal with that issue now.  2. Metabolic acidosis - CO2 normalized with acetate containing IVF's that were stopped yesterday.  If CO2 drops, can add back oral sodium bicarbonate.   3. Anemia - Hb dropped with volume expansion. Dosed Feraheme 510 6/20 for transferrin saturation of 10%. Given his age and comorbids could consider a unit of blood.  4. CKD 3, baseline creat 1.5-1.8 (1.47 05/04/15) - he's about halfway back to his baseline GFR (eGFR as of today about 25) 5. UTI, recurrent - >100,000 colonies yeast. IV diflucan started 6/19. 6. Chronic bilat hydro/ indwelling ureteral stents/ locally advance prost cancer - treated w transurethral resection of bladder mass occluding ureteral orifices in May '15 7. Vol depletion -resolved.  8. PPM/ hx afib 9. Hx SDH 10. Debility - bedbound x 6 mos, cared for at home    Disposition I will follow another day or so. See above recommendations.   Jamal Maes, MD [facility] [phone] Pager 07/06/2015, 11:37 AM

## 2015-07-06 NOTE — Progress Notes (Signed)
This is just a social visit. I heard that the patient was in the hospital.  He has progressive, metastatic prostate cancer. Obviously, he is quite debilitated.  Because of his bladder neck involvement, he has had a TURP as well as placement of bilateral double-J stents. His last stent change was in September 2016. His last visit to my office was in December. At that time, he was so debilitated that I did not think that continued androgen deprivation therapy was necessary.  Obviously, he has renal insufficiency. Stable mild to moderate bilateral hydronephrosis. This is not surprising with his long-term indwelling stents.  At this point, I do not think that he needs urgent stent change. However, I think in the next few weeks, if his clinical status remains stable, we would consider doing that. I will look towards setting up the procedure.

## 2015-07-07 LAB — RENAL FUNCTION PANEL
ALBUMIN: 2.4 g/dL — AB (ref 3.5–5.0)
Anion gap: 7 (ref 5–15)
BUN: 32 mg/dL — ABNORMAL HIGH (ref 6–20)
CALCIUM: 8.2 mg/dL — AB (ref 8.9–10.3)
CO2: 25 mmol/L (ref 22–32)
Chloride: 101 mmol/L (ref 101–111)
Creatinine, Ser: 2.47 mg/dL — ABNORMAL HIGH (ref 0.61–1.24)
GFR, EST AFRICAN AMERICAN: 27 mL/min — AB (ref >60)
GFR, EST NON AFRICAN AMERICAN: 23 mL/min — AB (ref >60)
Glucose, Bld: 78 mg/dL (ref 65–99)
PHOSPHORUS: 3.7 mg/dL (ref 2.5–4.6)
POTASSIUM: 4.3 mmol/L (ref 3.5–5.1)
SODIUM: 133 mmol/L — AB (ref 135–145)

## 2015-07-07 LAB — CBC
HEMATOCRIT: 22.4 % — AB (ref 39.0–52.0)
HEMOGLOBIN: 7.5 g/dL — AB (ref 13.0–17.0)
MCH: 29 pg (ref 26.0–34.0)
MCHC: 33.5 g/dL (ref 30.0–36.0)
MCV: 86.5 fL (ref 78.0–100.0)
Platelets: 294 10*3/uL (ref 150–400)
RBC: 2.59 MIL/uL — ABNORMAL LOW (ref 4.22–5.81)
RDW: 17.2 % — ABNORMAL HIGH (ref 11.5–15.5)
WBC: 10.7 10*3/uL — AB (ref 4.0–10.5)

## 2015-07-07 MED ORDER — FLUCONAZOLE 150 MG PO TABS: 150.0000 mg | ORAL_TABLET | Freq: Every evening | ORAL | Status: AC

## 2015-07-07 MED ORDER — ACETAMINOPHEN 325 MG PO TABS
650.0000 mg | ORAL_TABLET | Freq: Once | ORAL | Status: AC
  Administered 2015-07-07: 650 mg via ORAL
  Filled 2015-07-07: qty 2

## 2015-07-07 MED ORDER — SODIUM CHLORIDE 0.9 % IV SOLN
Freq: Once | INTRAVENOUS | Status: AC
  Administered 2015-07-08: 01:00:00 via INTRAVENOUS

## 2015-07-07 NOTE — Discharge Summary (Signed)
Physician Discharge Summary  [name] [id number] DOB: 1931-03-03 DOA: 06/30/2015  PCP: [name], MD  Admit date: 06/30/2015 Discharge date: 07/08/2015  Recommendations for Outpatient Follow-up:  1. Resolution of acute kidney injury, suggest repeat BMP next 3-5 days 2. Anemia of chronic kidney disease, consider repeat CBC as clinically indicated 3. Chronic indwelling Foley catheter with bilateral ureteral stents, urology will arrange outpatient follow-up 4. Unspecified neurologic degenerative process, progressive. Outpatient palliative care involved. 5. Resume home health RN, PT/OT, aide, social work   Discharge Diagnoses:  1. Sepsis secondary to fungal UTI catheter associated, present on admission 2. Acute kidney injury superimposed on chronic kidney disease stage III 3. Anemia of chronic kidney disease 4. Chronic indwelling Foley catheter 5. Unspecified degenerative neurologic process  Discharge Condition: improved Disposition: home   Diet recommendation: regular   Filed Weights   07/05/15 0536 07/06/15 0441 07/07/15 0445  Weight: 77.61 kg (171 lb 1.6 oz) 78.019 kg (172 lb) 77 kg (169 lb 12.1 oz)    History of present illness:  82 year old man chronic indwelling Foley catheter just completed 7 day course of ciprofloxacin for possible UTI, presented with nausea and vomiting and diarrhea, decreased appetite, increased weakness. Admitted for sepsis secondary to presumed fungal UTI based on urinalysis, acute kidney injury superimposed on chronic kidney disease, complicated by bedbound state.   Hospital Course:  Sepsis quickly resolved, treated with Diflucan with total course planned of 2 weeks; urine culture subsequently grew out a modest amount of enterococcus which is felt to be colonization given the patient's clinical improvement and therefore is not treated. Acute kidney injury, gradually resolving though not yet back to baseline. Seen by nephrology who has  now signed off as of 6/23 (recommendations push fluids). Oral intake will be challenging given patient's debilitated state. Risk of rehospitalization and dehydration is high. Patient has a degenerative neurologic process of unclear etiology for which he has been evaluated by neurology in past. This is long-standing and has been progressive over the last several years. He has been bedbound for the last several months at least and is only able to leave the house with an ambulance. Palliative care as an outpatient is already involved, as are home health and [facility]. A list of physicians that make house calls has been provided. Plan is for discharge home 6/24 with resumption of those services. Prognosis long-term is poor and his wife seems to understand this.   1. Sepsis secondary to fungal UTI, clinically resolved. Renal ultrasound chronic findings, unchanged bilateral hydronephrosis with stents in place. Urology saw socially and will follow-up as an outpatient. 2. Acute kidney injury superimposed on chronic kidney disease stage III, very slowly improving, would expect return to near baseline with adequate fluid intake though this will be a challenge in the outpatient setting. 3. Anemia of chronic kidney disease, stable. Receive 2 days of IV iron per nephrology. 4. Chronic indwelling Foley catheter, bilateral ureteral stents for bilateral ureteral obstruction 5. Unspecified neurologic degenerative process, progressive over the last several years to bedbound state, needs assistance with all ADLs.  6. Mild dementia  Consultants:  Nephrology  Procedures:    Antimicrobials:  Cefepime 6/18 >> 6/19  Fluconazole 6/19 >> 7/2  Discharge Instructions   Current Discharge Medication List    START taking these medications   Details  fluconazole (DIFLUCAN) 150 MG tablet Take 1 tablet (150 mg total) by mouth every evening. Qty: 9 tablet, Refills: 0      CONTINUE these medications which have NOT  CHANGED     Details  aspirin EC 81 MG tablet Take 81 mg by mouth daily.    donepezil (ARICEPT) 23 MG TABS tablet Take 23 mg by mouth at bedtime.    levothyroxine (SYNTHROID, LEVOTHROID) 137 MCG tablet Take 137 mcg by mouth daily before breakfast.    megestrol (MEGACE) 20 MG tablet Take 20 mg by mouth 2 (two) times daily.     Melatonin 5 MG CAPS Take 5 mg by mouth at bedtime.    mirabegron ER (MYRBETRIQ) 25 MG TB24 tablet Take 25 mg by mouth daily.    Multiple Vitamin (MULTIVITAMIN WITH MINERALS) TABS tablet Take 1 tablet by mouth daily.    pramipexole (MIRAPEX) 0.25 MG tablet Take 0.25 mg by mouth at bedtime. Refills: 9    Probiotic Product (PROBIOTIC PO) Take 1 tablet by mouth daily.    propafenone (RYTHMOL) 225 MG tablet Take 225 mg by mouth 2 (two) times daily.     propranolol ER (INDERAL LA) 60 MG 24 hr capsule Take 60 mg by mouth daily.    tiZANidine (ZANAFLEX) 2 MG tablet Take 2 mg by mouth every 8 (eight) hours as needed for muscle spasms.    zolpidem (AMBIEN) 10 MG tablet Take 10 mg by mouth at bedtime as needed for sleep. Reported on 06/29/2015 Refills: 1   Associated Diagnoses: Anemia in chronic renal disease      STOP taking these medications     ciprofloxacin (CIPRO) 250 MG tablet        No Known Allergies  The results of significant diagnostics from this hospitalization (including imaging, microbiology, ancillary and laboratory) are listed below for reference.    Significant Diagnostic Studies: US Renal Port  07/01/2015  CLINICAL DATA:  Acute kidney injury. EXAM: RENAL / URINARY TRACT ULTRASOUND COMPLETE COMPARISON:  12/07/2014 FINDINGS: Right Kidney: Length: 11.9 cm. Diffuse parenchymal atrophy. Mild to moderate hydronephrosis, similar to prior study. No solid mass lesions identified. Left Kidney: Length: 10.6 cm. Diffuse parenchymal atrophy. Mild to moderate hydronephrosis, similar to prior study. Bladder: Foley catheter decompresses the bladder. IMPRESSION: Bilateral  renal parenchymal atrophy. Bilateral hydronephrosis. Similar appearance to previous study. Foley catheter decompresses the bladder. Electronically Signed   By: Lucienne Capers M.D.   On: 07/01/2015 02:23    Microbiology: Recent Results (from the past 240 hour(s))  Culture, blood (Routine X 2) w Reflex to ID Panel     Status: None   Collection Time: 06/30/15  8:58 PM  Result Value Ref Range Status   Specimen Description BLOOD LEFT HAND  Final   Special Requests BOTTLES DRAWN AEROBIC AND ANAEROBIC 5CC  Final   Culture   Final    NO GROWTH 5 DAYS Performed at [facility]    Report Status 07/05/2015 FINAL  Final  Culture, blood (Routine X 2) w Reflex to ID Panel     Status: None   Collection Time: 06/30/15  9:07 PM  Result Value Ref Range Status   Specimen Description BLOOD LEFT FOREARM  Final   Special Requests BOTTLES DRAWN AEROBIC AND ANAEROBIC 5CC  Final   Culture   Final    NO GROWTH 5 DAYS Performed at [facility]    Report Status 07/05/2015 FINAL  Final  Urine culture     Status: Abnormal   Collection Time: 07/01/15 12:15 AM  Result Value Ref Range Status   Specimen Description URINE, CLEAN CATCH  Final   Special Requests cipro Normal  Final   Culture (A)  Final    >=100,000 COLONIES/mL YEAST 50,000 COLONIES/mL ENTEROCOCCUS FAECALIS    Report Status 07/04/2015 FINAL  Final   Organism ID, Bacteria ENTEROCOCCUS FAECALIS (A)  Final      Susceptibility   Enterococcus faecalis - MIC*    AMPICILLIN <=2 SENSITIVE Sensitive     LEVOFLOXACIN 1 SENSITIVE Sensitive     NITROFURANTOIN <=16 SENSITIVE Sensitive     VANCOMYCIN 1 SENSITIVE Sensitive     * 50,000 COLONIES/mL ENTEROCOCCUS FAECALIS     Labs: Basic Metabolic Panel:  Recent Labs Lab 07/03/15 0440 07/04/15 0524 07/05/15 0418 07/06/15 0348 07/07/15 0355  NA 133* 135 136 135 133*  K 4.5 4.2 3.9 4.2 4.3  CL 113* 111 107 103 101  CO2 12* 17* 23 24 25   GLUCOSE 82 92 100* 87 78  BUN 54* 51* 44* 35*  32*  CREATININE 3.93* 3.53* 2.89* 2.58* 2.47*  CALCIUM 8.3* 8.3* 8.2* 8.3* 8.2*  PHOS  --  3.2 2.7 3.2 3.7   Liver Function Tests:  Recent Labs Lab 07/04/15 0524 07/05/15 0418 07/06/15 0348 07/07/15 0355  ALBUMIN 2.2* 2.3* 2.4* 2.4*   CBC:  Recent Labs Lab 07/02/15 0457 07/03/15 0440 07/04/15 0608 07/05/15 0418 07/07/15 0355  WBC 8.9 7.6 8.4 7.7 10.7*  HGB 7.6* 7.3* 8.1* 7.8* 7.5*  HCT 22.2* 21.5* 23.3* 22.1* 22.4*  MCV 86.0 86.3 83.8 84.7 86.5  PLT 228 253 248 280 294    Principal Problem:   AKI (acute kidney injury) ([place]) Active Problems:   ATRIAL FIBRILLATION-paroxysmal   Pacemaker-dual-chamber-[employer]   UTI (lower urinary tract infection)   Sepsis (HCC)   Anemia in chronic kidney disease (CKD)   Time coordinating discharge: 35 minutes  Signed:  [name], MD [facility] 07/07/2015, 5:56 PM

## 2015-07-07 NOTE — Progress Notes (Signed)
PROGRESS NOTE  [name] [id number] DOB: Aug 02, 1931 DOA: 06/30/2015 PCP: [name], MD  Brief Narrative: 82 year old man chronic indwelling Foley catheter just completed 7 day course of ciprofloxacin for possible UTI, presented with nausea and vomiting and diarrhea, decreased appetite, increased weakness. Admitted for sepsis secondary to presumed fungal UTI based on urinalysis, acute kidney injury superimposed on chronic kidney disease, complicated by bedbound state.   Sepsis quickly resolved, treated with Diflucan with total course planned of 2 weeks; urine culture subsequently grew out a modest amount of enterococcus which is felt to be colonization given the patient's clinical improvement and therefore is not treated. Acute kidney injury, gradually resolving though not yet back to baseline. Seen by nephrology who has now signed off as of 6/23 (recommendations push fluids). Oral intake will be challenging given patient's debilitated state. Risk of rehospitalization and dehydration is high. Patient has a degenerative neurologic process of unclear etiology for which he has been evaluated by neurology in past. This is long-standing and has been progressive over the last several years. He has been bedbound for the last several months at least and is only able to leave the house with an ambulance. Palliative care as an outpatient is already involved, as are home health and [facility]. A list of physicians that make house calls has been provided. Plan is for discharge home 6/24 with resumption of those services. Prognosis long-term is poor and his wife seems to understand this.   Assessment/Plan: 1. Sepsis secondary to fungal UTI, clinically resolved. Renal ultrasound chronic findings, unchanged bilateral hydronephrosis with stents in place. Urology saw socially and will follow-up as an outpatient. 2. Acute kidney injury superimposed on chronic kidney disease stage III, very slowly improving,  would expect return to near baseline with adequate fluid intake though this will be a challenge in the outpatient setting. 3. Anemia of chronic kidney disease, stable. Receive 2 days of IV iron per nephrology. 4. Chronic indwelling Foley catheter, bilateral ureteral stents for bilateral ureteral obstruction 5. Unspecified neurologic degenerative process, progressive over the last several years to bedbound state, needs assistance with all ADLs.  6. Mild dementia   Renal function likely to improve as long as oral intake can be maintained. Plan to complete course of Diflucan. Continue fluids.  Prognosis is guarded long-term and patient is already followed by palliative care as an outpatient.  Anticipate return home 6/24  DVT prophylaxis: SCDs Code Status: DNR Family Communication: wife at bedside today, daughter by telephone last night Disposition Plan: home with [facility], [facility]  [name], MD  [facility] Direct contact: [phone] --[place] [place]  --[email or web address]; password [email or web address]  7PM-7AM contact night coverage as above 07/07/2015, 5:41 PM  LOS: 7 days   Consultants:  Nephrology   Procedures:    Antimicrobials:  Cefepime 6/18 >> 6/19  Fluconazole 6/19 >> 7/2  HPI/Subjective: Feels ok, hiccups back. Eating ok. Wife reports he is drinking a lot of fluids.  Objective: Filed Vitals:   07/06/15 2127 07/07/15 0445 07/07/15 0947 07/07/15 1338  BP: 156/70 168/70 112/84 137/68  Pulse: 50 46 49 44  Temp: 98.5 F (36.9 C) 98.2 F (36.8 C)  98.4 F (36.9 C)  TempSrc: Oral Oral  Oral  Resp: 20 18  16   Height:      Weight:  77 kg (169 lb 12.1 oz)    SpO2: 94% 96%  96%    Intake/Output Summary (Last 24 hours) at 07/07/15 1741 Last data filed at 07/07/15 1500  Gross  per 24 hour  Intake    450 ml  Output    776 ml  Net   -326 ml     Filed Weights   07/05/15 0536 07/06/15 0441 07/07/15 0445  Weight: 77.61 kg (171 lb 1.6 oz) 78.019 kg (172 lb) 77 kg (169 lb  12.1 oz)    Exam:    Constitutional:  . Appears calm and comfortable, debilitated, weak ENMT:  . grossly normal hearing  Neck:  . Grossly weak, difficulty maintaining the head in neutral position. Trunk is quite weak, unable to sit up Respiratory:  . CTA bilaterally, no w/r/r.  . Respiratory effort normal. No retractions or accessory muscle use Cardiovascular:  . RRR, no m/r/g . No LE extremity edema   Musculoskeletal:  . RUE, LUE, RLE, LLE   o strength grossly decreased, hardly able to lift legs off the bed, able to lift arms off the bed but very tremulous. Tone normal, no atrophy. Tremulous. o Range of motion is limited by poor strength. Psychiatric:  . Mental status o Mood, affect appropriate  I have personally reviewed following labs and imaging studies:  Renal function slowly improving, BUN 32, creatinine 2.47, minimal change since yesterday.  Albumin 2.4  Hemoglobin without significant change, 7.5  Scheduled Meds: . aspirin EC  81 mg Oral Daily  . donepezil  23 mg Oral QHS  . ferumoxytol  510 mg Intravenous Weekly  . fluconazole  150 mg Oral QPM  . levothyroxine  137 mcg Oral QAC breakfast  . megestrol  20 mg Oral BID  . mirabegron ER  25 mg Oral Daily  . polyethylene glycol  17 g Oral BID  . pramipexole  0.25 mg Oral QHS  . propafenone  225 mg Oral BID  . propranolol ER  60 mg Oral Daily  . sodium chloride flush  3 mL Intravenous Q12H   Continuous Infusions: . dextrose 5 % 1,000 mL with sodium acetate 150 mEq infusion 10 mL/hr at 07/05/15 1319    Principal Problem:   AKI (acute kidney injury) ([place]) Active Problems:   ATRIAL FIBRILLATION-paroxysmal   Pacemaker-dual-chamber-[employer]   UTI (lower urinary tract infection)   Sepsis (HCC)   Anemia in chronic kidney disease (CKD)   LOS: 7 days   Time spent 25 minutes

## 2015-07-07 NOTE — Progress Notes (Signed)
[facility] Progress Note   Subjective/Interval Events:  IVF to [facility] 6/21 Recorded po intake inadequate to keep up with UOP right now  Dr. Diona Fanti has seen and is looking to do stent change in the next few weeks.  I have reviewed vitals, weights, intake and output, labs, and medications  BP 112/84 mmHg  Pulse 49  Temp(Src) 98.2 F (36.8 C) (Oral)  Resp 18  Ht 6\' 1"  (1.854 m)  Wt 77 kg (169 lb 12.1 oz)  BMI 22.40 kg/m2  SpO2 96%   Inpatient medications: . aspirin EC  81 mg Oral Daily  . donepezil  23 mg Oral QHS  . ferumoxytol  510 mg Intravenous Weekly  . fluconazole  150 mg Oral QPM  . levothyroxine  137 mcg Oral QAC breakfast  . megestrol  20 mg Oral BID  . mirabegron ER  25 mg Oral Daily  . polyethylene glycol  17 g Oral BID  . pramipexole  0.25 mg Oral QHS  . propafenone  225 mg Oral BID  . propranolol ER  60 mg Oral Daily  . sodium chloride flush  3 mL Intravenous Q12H   . dextrose 5 % 1,000 mL with sodium acetate 150 mEq infusion 10 mL/hr at 07/05/15 1319   acetaminophen **OR** acetaminophen, alum & mag hydroxide-simeth, benzonatate, ondansetron **OR** ondansetron (ZOFRAN) IV    Recent Labs Lab 07/05/15 0418 07/06/15 0348 07/07/15 0355  NA 136 135 133*  K 3.9 4.2 4.3  CL 107 103 101  CO2 23 24 25   GLUCOSE 100* 87 78  BUN 44* 35* 32*  CREATININE 2.89* 2.58* 2.47*  CALCIUM 8.2* 8.3* 8.2*  PHOS 2.7 3.2 3.7    Recent Labs Lab 06/30/15 1613  07/05/15 0418 07/06/15 0348 07/07/15 0355  AST 14*  --   --   --   --   ALT 11*  --   --   --   --   ALKPHOS 50  --   --   --   --   BILITOT 0.6  --   --   --   --   PROT 7.1  --   --   --   --   ALBUMIN 3.1*  < > 2.3* 2.4* 2.4*  < > = values in this interval not displayed.  Recent Labs Lab 07/04/15 0608 07/05/15 0418 07/07/15 0355  WBC 8.4 7.7 10.7*  HGB 8.1* 7.8* 7.5*  HCT 23.3* 22.1* 22.4*  MCV 83.8 84.7 86.5  PLT 248 280 294   Lab Results  Component Value Date   IRON 17*  07/04/2015   TIBC 174* 07/04/2015   FERRITIN 38 12/08/2014  TSat 10%   Assessment/Recommendations: 1. Acute on CKD 3 - creat appears to have reached a plateau, not back at baseline from April 2017 as of yet. Off IVF 48 hours.  To keep up po needs to be able to take in about 2 liters/day and does not appear to be keeping up at this time. Will need to be pushed at home to keep up.  Urology planning for elective stent changes next couple of weeks (which may offer some further improvement in GFR).   Not a dialysis candidate due to comorbidities and debility but it looks like we won't have to deal with that issue now.  2. Metabolic acidosis - CO2 normalized with acetate containing IVF's that were stopped yesterday.  If CO2 drops, can add back oral sodium bicarbonate.   3. Anemia - Hb  dropped with volume expansion. Dosed Feraheme 510 6/20 for transferrin saturation of 10%. Given his age and comorbids could consider a unit of blood.  4. CKD 3, baseline creat 1.5-1.8 (1.47 05/04/15) - he's about halfway back to his baseline GFR  5. UTI, recurrent - >100,000 colonies yeast. IV diflucan started 6/19->switched to oral. No plans to treat the enterococcus. 6. Chronic bilat hydro/ indwelling ureteral stents/ locally advance prost cancer - treated w transurethral resection of bladder mass occluding ureteral orifices in May '15; last stent change 09/2014 and urology planning elective stent change next few weeks 7. Vol depletion -resolved.  8. PPM/ hx afib 9. Hx SDH 10. Debility - bedbound x 6 mos, cared for at home    Disposition: no additional recommendations beyond those outlined above. His primary care (Dr. Felipa Eth) can follow his labs (can be drawn by [facility]). Office follow up would be impossible (wife has said can't go to appts d/t total bedbound status). Call me if questions about these recommendations. Signing off at this time.  Jamal Maes, MD [facility] [phone]  Pager 07/07/2015, 12:45 PM

## 2015-07-08 DIAGNOSIS — N179 Acute kidney failure, unspecified: Secondary | ICD-10-CM

## 2015-07-08 LAB — PREPARE RBC (CROSSMATCH)

## 2015-07-08 NOTE — Progress Notes (Signed)
[facility]                                                                                                                                                                                                          Patient Demographics  [name], is a 82 y.o. male  [id number]  [id number]  DOB - 08/15/1931  Admit date - 06/30/2015  Admitting Physician Lily Kocher, MD  Outpatient Primary MD for the patient is [name], MD  LOS - 8   Chief Complaint  Patient presents with  . Weakness  . Nausea  . Emesis        Assessment & Plan    Patient seen briefly today due for discharge soon per discharge done yesterday by Dr Sarajane Jews, no further issues, Vital signs stable, patient feels fine.      Medications  Scheduled Meds: . aspirin EC  81 mg Oral Daily  . donepezil  23 mg Oral QHS  . ferumoxytol  510 mg Intravenous Weekly  . fluconazole  150 mg Oral QPM  . levothyroxine  137 mcg Oral QAC breakfast  . megestrol  20 mg Oral BID  . mirabegron ER  25 mg Oral Daily  . polyethylene glycol  17 g Oral BID  . pramipexole  0.25 mg Oral QHS  . propafenone  225 mg Oral BID  . propranolol ER  60 mg Oral Daily  . sodium chloride flush  3 mL Intravenous Q12H   Continuous Infusions: . dextrose 5 % 1,000 mL with sodium acetate 150 mEq infusion 10 mL/hr at 07/05/15 1319   PRN Meds:.acetaminophen **OR** acetaminophen, alum & mag hydroxide-simeth, benzonatate, ondansetron **OR** ondansetron (ZOFRAN) IV    Time Spent in minutes   10 minutes   Lala Lund K M.D on 07/08/2015 at 9:57 AM  Between 7am to 7pm - Pager - [phone]  After 7pm go to [email or web address] - password [email or web address]  And look for the night coverage person covering for me after hours  [facility] Office  [phone]    Subjective:   [name] today has, No headache, No chest pain, No abdominal pain -  No Nausea, No new weakness tingling or numbness, No Cough - SOB. Wants to go home.  Objective:   Filed Vitals:   07/08/15 0120 07/08/15 0148 07/08/15 0345 07/08/15 0700  BP: 157/59 141/64 152/59   Pulse: 45 46 49   Temp: 98.9 F (37.2 C) 98.8 F (37.1 C) 98.9 F (37.2 C)   TempSrc: Oral Oral Oral   Resp: 16 16 16  Height:      Weight:    77.2 kg (170 lb 3.1 oz)  SpO2: 95% 97% 97%     Wt Readings from Last 3 Encounters:  07/08/15 77.2 kg (170 lb 3.1 oz)  06/07/15 72.576 kg (160 lb)  05/04/15 72.53 kg (159 lb 14.4 oz)     Intake/Output Summary (Last 24 hours) at 07/08/15 0957 Last data filed at 07/08/15 0530  Gross per 24 hour  Intake 1067.34 ml  Output   1450 ml  Net -382.66 ml    Exam Awake Alert, Oriented X 3, No new F.N deficits, Normal affect [place].AT,PERRAL Supple Neck,No JVD, No cervical lymphadenopathy appriciated.  Symmetrical Chest wall movement, Good air movement bilaterally, CTAB RRR,No Gallops,Rubs or new Murmurs, No Parasternal Heave +ve B.Sounds, Abd Soft, Non tender, No organomegaly appriciated, No rebound - guarding or rigidity. No Cyanosis, Clubbing or edema, No new Rash or bruise     Data Review

## 2015-07-10 ENCOUNTER — Other Ambulatory Visit: Payer: Self-pay | Admitting: *Deleted

## 2015-07-10 ENCOUNTER — Other Ambulatory Visit: Payer: Self-pay | Admitting: Licensed Clinical Social Worker

## 2015-07-10 LAB — TYPE AND SCREEN
ABO/RH(D): O POS
ANTIBODY SCREEN: NEGATIVE
Unit division: 0

## 2015-07-10 NOTE — Patient Outreach (Signed)
[facility]) Care Management  07/10/2015  [name] 02/25/1931 [id number]   Assessment- CSW completed outreach to patient's residence due to recent hospitalization. Patient discharged from hospital on 07/08/15. Patient's spouse answered the phone. She reports that patient is "not doing too well. It's going to take some time for him to recovery but they got his kidneys under control." CSW questioned if she had heard from home health and she said yes that [name] would be completing a visit tomorrow. CSW informed her that [place] is not apart of home health. CSW provided education on the difference between home health and [facility]. Spouse reports that she has not heard back from Hospice or Palliative care as well. Spouse unable to talk on the phone any longer at this time but is agreeable to next outreach in one week.   Plan-CSW will complete outreach in one week.  [name], BSW, MSW, [email or web address] Phone: [phone] Fax: [phone]

## 2015-07-10 NOTE — Patient Outreach (Signed)
Spoke with Mrs. Machamer today. She reports her husband is very weak. She has discharge instructions and understands them, they have all his meds, he will not be able to go to an MD visit as he is homebound. She has not heard from home health yet nor palliative care or Hospice. She asks if I can come as soon as possible and we made an appt for me to visit with them tomorrow at 11:00 am. Pt did receive a blood transfusion before he left the hospital.  [name] [facility] [phone]

## 2015-07-11 ENCOUNTER — Encounter: Payer: Self-pay | Admitting: *Deleted

## 2015-07-11 ENCOUNTER — Other Ambulatory Visit: Payer: Self-pay | Admitting: *Deleted

## 2015-07-11 NOTE — Patient Outreach (Signed)
[facility]) Care Management   07/11/2015  JAHSHAWN SCHULTEIS Aug 25, 1931 [id number]  MUTAZ LUNDAY is an 82 y.o. male  Subjective: Pt has extended hosptialization for sepsis, accute kidney injury, anemia. He came home on Saturday. The family observes a sharp decline in his status. He may have choked on a pill last night which he did expectorate. Today he ate a good breakfast. Pt does have some neck pain on the left.   Palliative care has been requested and will begin this week. [facility].  Objective:   Review of Systems  Eyes: Negative.   Respiratory: Negative.   Cardiovascular: Negative.   Gastrointestinal: Negative.   Genitourinary:       Foley is 73 [place], 10 cc balloon.  Musculoskeletal: Positive for neck pain.  Neurological: Positive for weakness.  Endo/Heme/Allergies: Negative.   Psychiatric/Behavioral: Negative.    BP 136/70 mmHg  Pulse 46  Resp 16  SpO2 93%   Physical Exam  Constitutional: He is oriented to person, place, and time.  Neck:  Pt has limited ROM to neck, favors L side, possible contracture/spasm..   Cardiovascular: Normal rate, regular rhythm and normal heart sounds.   Respiratory: Effort normal and breath sounds normal.  GI: Soft. Bowel sounds are normal.  Musculoskeletal:  Bedbound and limited ROM.  Neurological: He is alert and oriented to person, place, and time.  Skin: Skin is warm and dry.  Psychiatric: He has a normal mood and affect.    Encounter Medications:   Outpatient Encounter Prescriptions as of 07/11/2015  Medication Sig Note  . aspirin EC 81 MG tablet Take 81 mg by mouth daily.   Marland Kitchen donepezil (ARICEPT) 23 MG TABS tablet Take 23 mg by mouth at bedtime.   . fluconazole (DIFLUCAN) 150 MG tablet Take 1 tablet (150 mg total) by mouth every evening.   Marland Kitchen levothyroxine (SYNTHROID, LEVOTHROID) 137 MCG tablet Take 137 mcg by mouth daily before breakfast.   . megestrol (MEGACE) 20 MG tablet Take 20 mg by mouth 2  (two) times daily.  10/01/2014: .  Marland Kitchen Melatonin 5 MG CAPS Take 5 mg by mouth at bedtime.   . mirabegron ER (MYRBETRIQ) 25 MG TB24 tablet Take 25 mg by mouth daily.   . Multiple Vitamin (MULTIVITAMIN WITH MINERALS) TABS tablet Take 1 tablet by mouth daily.   . pramipexole (MIRAPEX) 0.25 MG tablet Take 0.25 mg by mouth at bedtime.   . Probiotic Product (PROBIOTIC PO) Take 1 tablet by mouth daily.   . propafenone (RYTHMOL) 225 MG tablet Take 225 mg by mouth 2 (two) times daily.    . propranolol ER (INDERAL LA) 60 MG 24 hr capsule Take 60 mg by mouth daily.   Marland Kitchen tiZANidine (ZANAFLEX) 2 MG tablet Take 2 mg by mouth every 8 (eight) hours as needed for muscle spasms.   Marland Kitchen zolpidem (AMBIEN) 10 MG tablet Take 10 mg by mouth at bedtime as needed for sleep. Reported on 06/29/2015 06/15/2014: .    No facility-administered encounter medications on file as of 07/11/2015.    Functional Status:   In your present state of health, do you have any difficulty performing the following activities: 06/30/2015 06/07/2015  Hearing? N N  Vision? N N  Difficulty concentrating or making decisions? N N  Walking or climbing stairs? Y Y  Dressing or bathing? Y Y  Doing errands, shopping? Tempie Donning  Preparing Food and eating ? - Y  Using the Toilet? - Y  In the past six  months, have you accidently leaked urine? - N  Do you have problems with loss of bowel control? - N  Managing your Medications? - Y  Managing your Finances? - Y  Housekeeping or managing your Housekeeping? - Y    Fall/Depression Screening:    PHQ 2/9 Scores 06/14/2015 06/07/2015 01/27/2014 01/27/2014  PHQ - 2 Score 0 0 0 0    Assessment:  Resolving UTI.                          Considering [facility]  Plan: Colloborate with [facility].            Pt is due for foley catheter change.           What do we do for home health needs. Pt prior to hosptialization was with [facility] for nursing and PT Lynann Bologna [phone] and Herby Abraham PT).            Discussed reducing medications: stop zolpidem. This is also something that Dr. Felipa Eth needs to weigh in on.            Will probably need labs next week.  THN CM Care Plan Problem Two        Most Recent Value   Care Plan Problem Two  Declining health, nearing end of life.   Role Documenting the Problem Two  Care Management Coordinator   Care Plan for Problem Two  Active   THN CM Short Term Goal #1 (0-30 days)  Patient and family with have a smooth transition to Palliative Care/Hospice over the next 30 days.   THN CM Short Term Goal #1 Start Date  07/11/15   Interventions for Short Term Goal #2   Asked pt and family what current desire for plan of care is (comfort). Discussed this and transition to palliative care/Hospice.     [name] [facility] [phone]

## 2015-07-12 ENCOUNTER — Other Ambulatory Visit: Payer: Self-pay | Admitting: *Deleted

## 2015-07-12 NOTE — Patient Outreach (Addendum)
Called [facility] to report yesterday's visit findings and questions from myself and family. [name] informed me they have an appt on Friday to open the case. I advised I will be calling pt weekly for transition of care calls. We discussed the need for pt foley to be changed and if [facility] would provide Home Health or if this needs to be reordered from [facility] who was providing these services which they pt and family were very happy with. We discussed the request to reduce pt medications. We also discussed the need for smooth transition between our 2 groups. I advised I discussed at length palliative care and hospice. [name] and [name] (daughter) have many questions about what to do in different situations. They are in favor of NO FURTHER HOSPITALIZATIONS, comfort is their main concern and providing a good quality of life. They are very receptive and will be a lovely family to work with. They will probably need to hire some private in home care to give themselves some respite. They need a lot of education on caring for him in bed: moving him, protecting themselves, positioning. I touched on these things briefly.   I called Dr. Carlyle Lipa office and left a message with his nurse to make sure he sees my note from yesterday and the specific needs of this pt: Home health nursing and PT, reduction of his medication list, possibly a Civil Service fast streamer.  I called Chrystal Calloway at [facility] Management and left a message for her to return my call as I have a report to give her.  Called and talked with [name] about these updates. I told her I would be calling again next week.  [name] [facility] [phone]  PT DAUGHTER, ANN, CALLED ME Saturday AND THEY ARE REQUESTING [facility]STEAD OF [facility]) SO IF THEY WANT TO GO TO THE [facility] AT THE END OF LIFE HE WILL BE LOCAL AT [facility]. I WILL ADVISE DR. Felipa Eth. I HAVE CALLED [facility] [place] AND MADE THE INITIAL REFERRAL, WILL SEND DOCUMENTS ON Monday.  [name] [facility] [phone]

## 2015-07-15 ENCOUNTER — Other Ambulatory Visit: Payer: Self-pay | Admitting: Internal Medicine

## 2015-07-17 ENCOUNTER — Other Ambulatory Visit: Payer: Self-pay | Admitting: Licensed Clinical Social Worker

## 2015-07-17 ENCOUNTER — Other Ambulatory Visit: Payer: Self-pay | Admitting: *Deleted

## 2015-07-17 NOTE — Telephone Encounter (Signed)
Spoke with pt's wife and she confirmed that pt is on Propranolol ER 60 mg capsule - 1 QD. She is unsure if pt will ever be able to F/U with Dr. Caryl Comes because the pt is bedridden. I informed her that I will refill the medication today and in the future the refills will need to be managed by his PCP, Dr. Felipa Eth. Pt's wife verbalized understanding and appreciated the call back.

## 2015-07-17 NOTE — Telephone Encounter (Signed)
Refill request recieved for Propranolol ER 60 mg - 1 QD. Pt last seen in office by Tommye Standard, PA-C on 11/29/14. Propranolol 10 mg - 1 tab BID was on the med list then and no changes were made. Pt was supposed to F/U in 3 months with next APP however pt is bedridden and could not come in. Pt was in hospital in 04/2015 and Propranolol ER 60 mg - 1 QD was D/C according to discharge summary. Pt back in hospital in 06/2015 and Propranolol ER 60 mg - 1 QD was back on the med list and states to continue this medication. Which med/dose should the pt be on? Please review and advise. Thanks!

## 2015-07-17 NOTE — Telephone Encounter (Signed)
Would confirm with the patient that he is taking Propranolol ER 60 mg. OK to refill short term if this is the case. Find out if he will be following up at all, and if not, future refills will need to come from the patient's PCP.  Thanks!

## 2015-07-17 NOTE — Patient Outreach (Addendum)
[facility]) Care Management  07/17/2015  REDMOND WHITTLEY [birth year] [id number]   Assessment- CSW completed outreach to patient's residence today on 07/17/15. Patient's spouse answered. Patient's spouse is very confused and overwhelmed with various agencies contacting her and "not know who is which." CSW provided education on the all the programs that are involved with her and the difference between their services. CSW questioned if [facility] completed visit last week and she confirmed. She states that Manus Gunning was helpful and provided a great deal of information but that family has decided that it would be best to go with [facility] instead due to their location in [place]. CSW will update [facility] [employer]. CSW questioned how patient is feeling today. She reports "He seems to be getting weaker with everyday." CSW questioned if she completed visit with [place] [facility] Benefits specialist at [place] and she confirmed that she did. She reports that she went last week and that he was very kind and helpful. She states that unfortunately they are missing an important form that is needed in order to complete application. She states that he completed all other aspects of application but will need that form first before submitting application. She states that she does not know the name of this form but that he ordered it for her and that it will take several weeks in order to retreive document. CSW appraised her for completing this appointment and is happy that family in on the right track with gaining [employer].  Plan-CSW will update THN [name] on Palliative Care [facility] decision. CSW will follow up with [facility] Specialist with DSS in two weeks and follow up with spouse in two weeks as well.  THN CM Care Plan Problem One        Most Recent Value   Care Plan Problem One  Lack of community resources including Veteran Benefits   Role Documenting the  Problem One  Clinical Social Worker   Care Plan for Problem One  Active   THN Long Term Goal (31-90 days)  Patient and family will be educated on how to apply for [facility] benefits and will follow up with resources provided within 90 days   THN Long Term Goal Start Date  06/13/15   Interventions for Problem One Long Term Goal  CSW will complete home visit and provide education on how and where to apply for [facility] benefits and will review these resources as needed. CSW will monitor this process as well.    THN CM Short Term Goal #1 (0-30 days)  Patient and family will be educated on what Veteran benefits look like within 30 days and the various services they can provide for family   THN CM Short Term Goal #1 Start Date  06/13/15   [facility] CM Short Term Goal #1 Met Date  07/17/15   Interventions for Short Term Goal #1  Education provided. Patient 's spouse did complete appointment at [place] Specialist office at [place] last week. Form has been ordered in order to complete application.   THN CM Short Term Goal #2 (0-30 days)  Patient and family will be educated on Development worker, community within [facility] within 30 days   [facility] CM Short Term Goal #2 Start Date  06/13/15   [facility] CM Short Term Goal #2 Met Date  07/17/15   Interventions for Short Term Goal #2  CSW completed education on Senior Resources with family.   THN CM Short Term  Goal #3 (0-30 days)  Family will gain needed form in order to complete Veteran Benefits application within 30 days   THN CM Short Term Goal #3 Start Date  07/17/15   Interventions for Short Tern Goal #3  CSW will monitor this process and contact Veteran Specialist at DSS if needed. Family has met with Veteran Specialist at DSS already as suggested by CSW.        , BSW, MSW, LCSW [facility] Care Management [email or web address] Phone: [phone] Fax: [phone]   

## 2015-07-17 NOTE — Telephone Encounter (Signed)
LM with daughter to have pt's wife call me back to verify dose.

## 2015-07-17 NOTE — Patient Outreach (Signed)
[name] will be seen by [place] this evening. I spoke with daughter, [name], this afternoon and all has been coordinated. I requested EPIC documents be sent from [facility] office: hospital discharge, my last note and first visit note after hospitalization. Referral was requested and given by MD on call (Dr. Hal Neer) is away from the office this week. [name] says everything seems to be falling into place.   I have advised her that my co-worker will be calling next week to check in on them. I will call pt & family the next week.  [name] [facility] [phone]

## 2015-07-24 ENCOUNTER — Other Ambulatory Visit: Payer: Self-pay | Admitting: *Deleted

## 2015-07-24 NOTE — Patient Outreach (Signed)
07/24/15- Telephone call to patient for transition of care week 3, spoke with daughter Webb Silversmith (permission given by pt), Webb Silversmith reports pt is active with hospice and is attending MD appointments, reports pt has all medications, is taking morphine for pain (mostly leg pain) and daughter states medication is helping.  Pt has  RN visiting once weekly, CNA services 3 times per week plus volunteer services.  Appetite is fair.  RN CM reiterated resources for hospice and that they are a 24/7 service.  THN CM Care Plan Problem Two        Most Recent Value   THN CM Short Term Goal #1 (0-30 days)  Patient and family with have a smooth transition to Palliative Care/Hospice over the next 30 days.   THN CM Short Term Goal #1 Start Date  07/11/15   Interventions for Short Term Goal #2   Pt is active with hospice, RN CM reviewed resources for contacting hospice and reminded they are a 24/7 service.      PLAN Continue weekly transition of care calls  Jacqlyn Larsen [facility] [place] Coordinator [phone]

## 2015-07-27 ENCOUNTER — Encounter: Payer: Self-pay | Admitting: Licensed Clinical Social Worker

## 2015-07-27 ENCOUNTER — Other Ambulatory Visit: Payer: Self-pay | Admitting: Licensed Clinical Social Worker

## 2015-07-27 NOTE — Patient Outreach (Signed)
[facility]) Care Management  07/27/2015  [name] 26-Apr-1931 [id number]   Assessment- CSW completed outreach call to patient's residence on 07/27/15. Patient's spouse answered. She shares that patient is now involved with Hospice [facility] She states "They are taking really good care of him." She reports that she chose [facility] over [facility] in the case patient ever needed residential care then family would want him close by. She shares that the [employer] form that they needed in order to complete application has still not arrived but can take several weeks to gain. She shared "I'mt not sure if I still need their benefits now that hospice is involved." CSW spent time educating her on the benefits that the [place] offers. She is agreeable to still apply for benefits for patient. She reports that patient is "not moving at all and he's still in a lot of pain." However, she reports that Hospice has assisted with patient's comfort. CSW will complete social work discharge at this time since Hospice is now involved. Spouse is agreeable to social work discharge and is extremely grateful for [facility] assistance. CSW informed her that [name], NP with [facility] is still involved at this time.   THN CM Care Plan Problem One        Most Recent Value   THN CM Short Term Goal #3 (0-30 days)  Family will gain needed form in order to complete [facility] Benefits application within 30 days   THN CM Short Term Goal #3 Start Date  07/17/15   [facility] CM Short Term Goal #3 Met Date  -- [Goal not met but in the process]   Interventions for Short Tern Goal #3  Form will take several more weeks in order to gain but family has taken the necessary steps in order to gain form and apply for VA benefits     Plan-CSW will update [facility] NP [name] of discharge. CSW will send PCP update of social work discharge. CSW will discharge patient from  case load at this time.  [name], BSW, MSW, [email or web address] Phone: [phone] Fax: [phone]

## 2015-07-31 ENCOUNTER — Other Ambulatory Visit: Payer: Self-pay | Admitting: *Deleted

## 2015-07-31 NOTE — Patient Outreach (Signed)
Talked with pt's daughter, Sherle Poe, who informs me pt is in good hands with [facility]. She says they are helping them with everything and being very supportive. She says her Dad has good days and poor days but he is dwindling. He is getting some skin breakdown and sometimes doesn't eat at all in a day. He has had some prime time with family visitations which has been very good.  I reminded her I am available to them should they need anything. I will call again next week.  [name] [facility] [place] [phone]

## 2015-08-07 ENCOUNTER — Encounter: Payer: Self-pay | Admitting: *Deleted

## 2015-08-07 ENCOUNTER — Other Ambulatory Visit: Payer: Self-pay | Admitting: *Deleted

## 2015-08-07 NOTE — Patient Outreach (Signed)
Case closure telephone call. I spoke to [name] today who reports her husand and the family are being well cared for by the [facility]. She cannot think of anything else that I may be able to offer to them at this time. I am going to close the case as he is under the Hospice care and they are well established with them now. I advised I will always be available by phone if they should ever need other nursing advise if their Hospice nurse is not available. I have advised her I am going to close his case. She said she was thankful for my care and practical advice during [facility] involvement.  [name] [facility] [place] [phone]

## 2015-08-15 DEATH — deceased

## 2015-12-24 IMAGING — DX DG CHEST 2V
2 series · 2 of 2 positions shown · non-contrast
Comparison: Radiograph dated 07/10/2013

CLINICAL DATA: 83-year-old male with weakness

EXAM:
CHEST  2 VIEW

[chest lat]
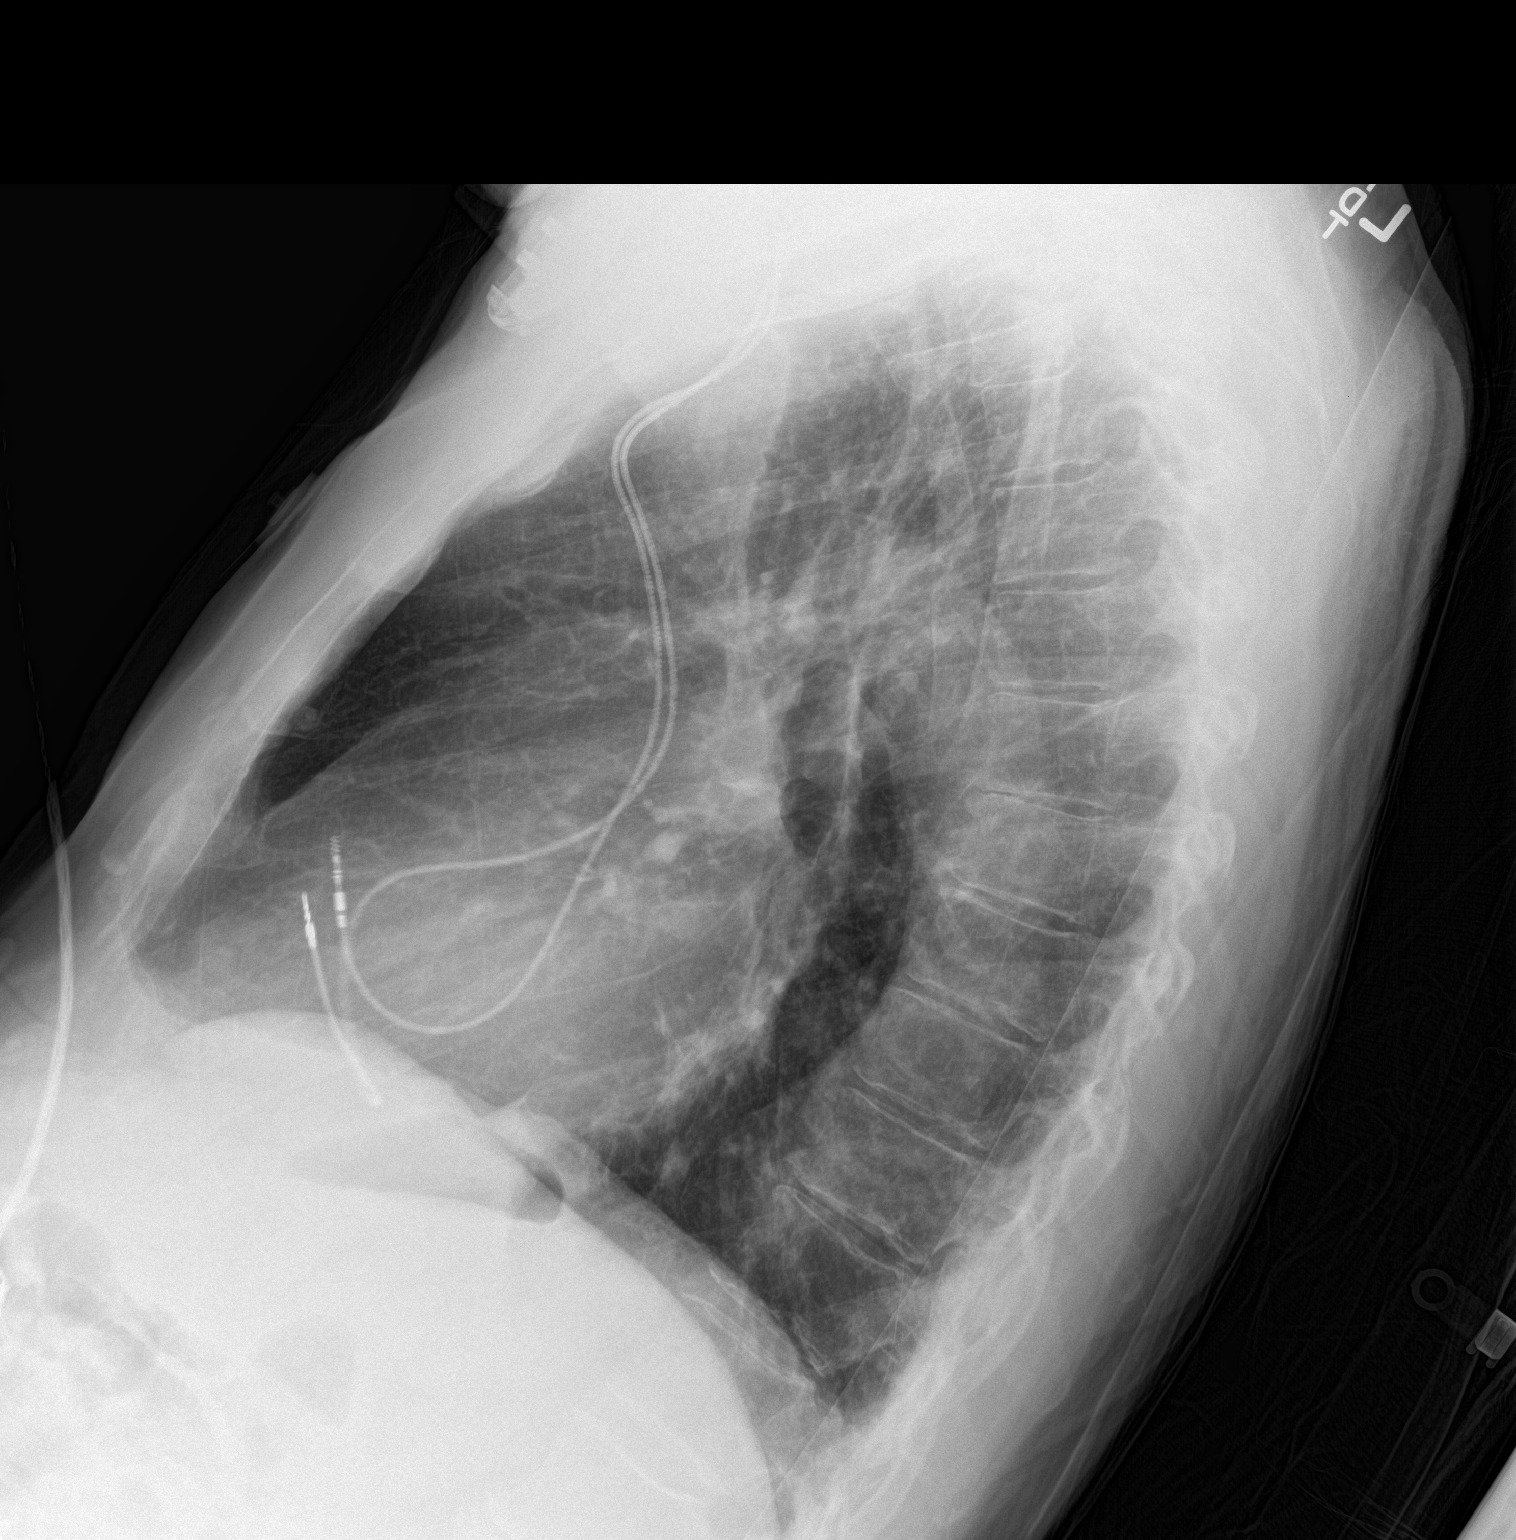

[chest ap]
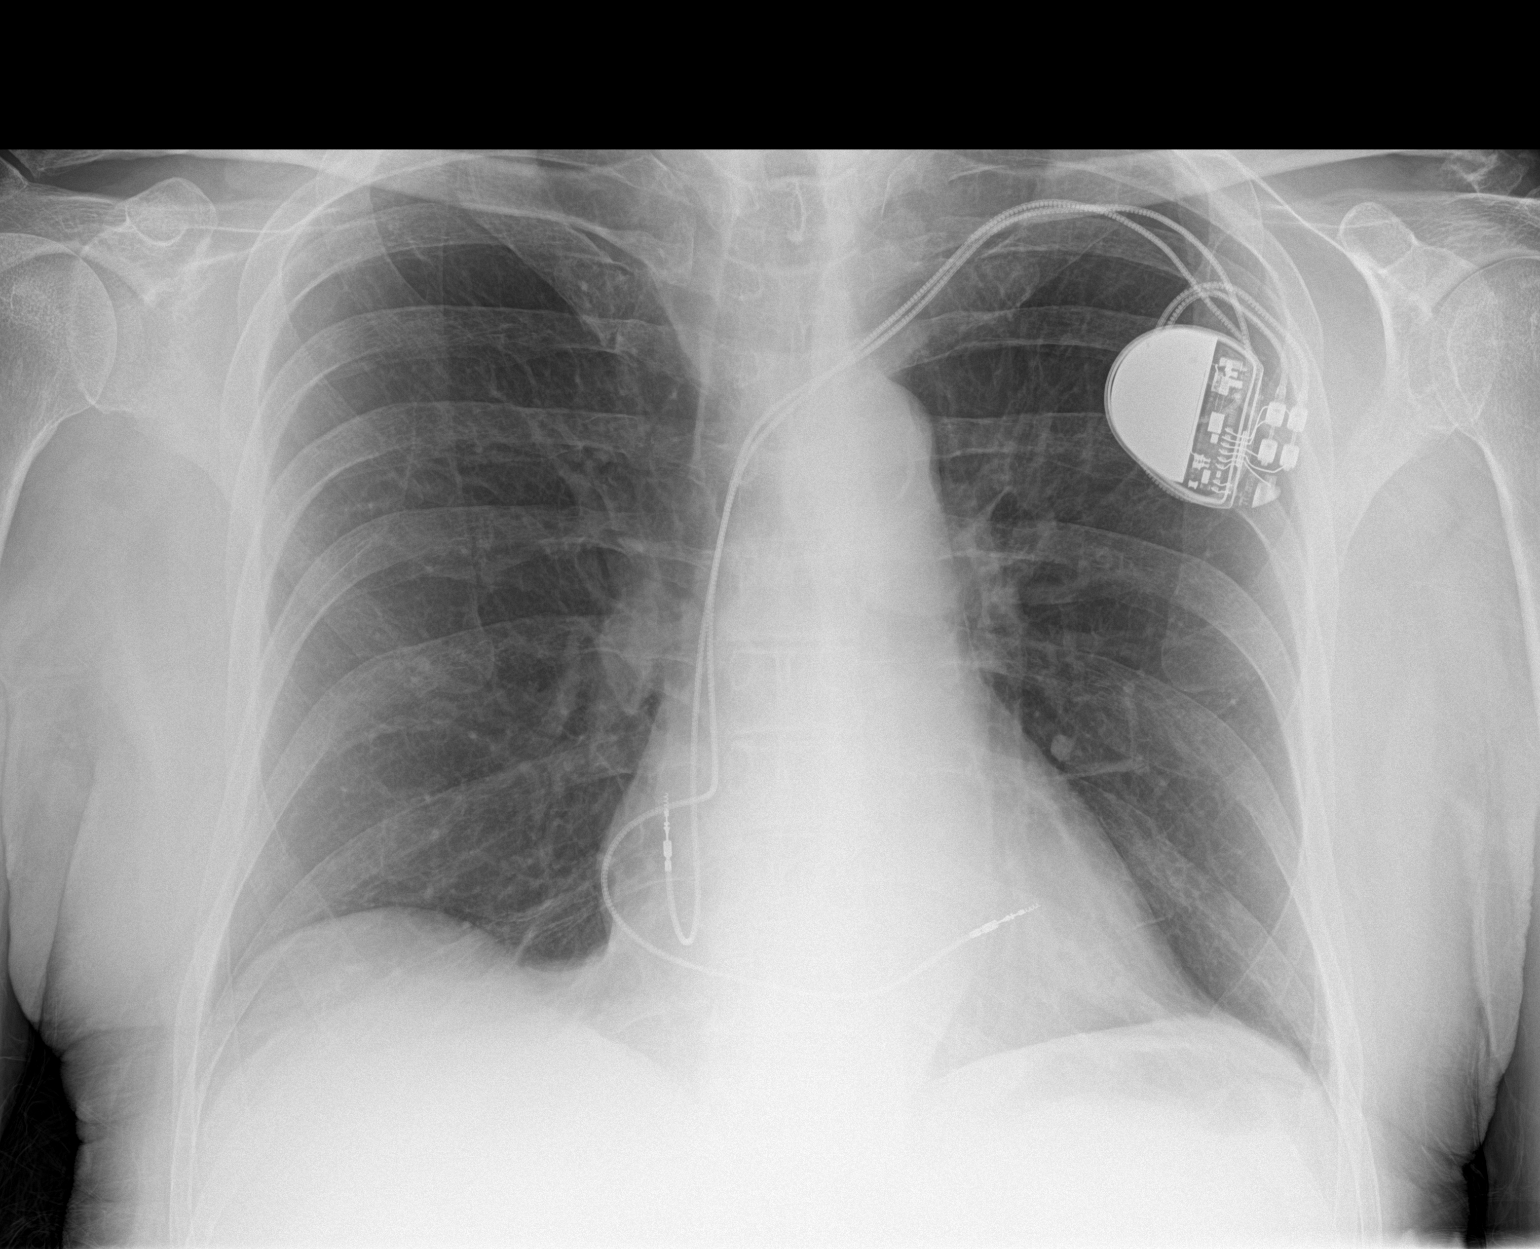

[2 of 2 positions shown; findings below may reference images not displayed]

FINDINGS: Left pectoral pacemaker device. The heart size and mediastinal
contours are within normal limits. Both lungs are clear. The
visualized skeletal structures are unremarkable.
IMPRESSION: No active cardiopulmonary disease.

## 2016-02-10 IMAGING — CT CT L SPINE W/ CM
3 of 8 series · 9 of 33 positions shown, 11 images · non-contrast
Comparison: Lumbar spine CT 07/28/2014

CLINICAL DATA: Normal pressure hydrocephalus. Progressive weakness
in both legs.

EXAM:
LUMBAR MYELOGRAM
PROCEDURE:
Lumbar puncture and intrathecal contrast administration were
performed by Dr. Mazaleni who will separately report for the portion
of the procedure. I personally supervised acquisition of the
myelogram images.
TECHNIQUE: Contiguous axial images were obtained through the Lumbar spine after
the intrathecal infusion of infusion. Coronal and sagittal
reconstructions were obtained of the axial image sets.

[Series 208: cor std · coronal · 0.37mm/px · 3 of 58 slices shown]
[im 12/58  bone]
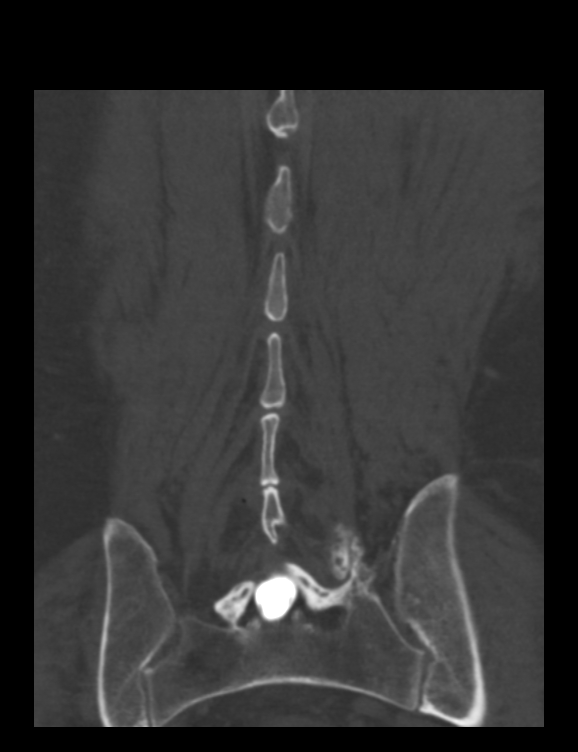
[im 23/58  bone]
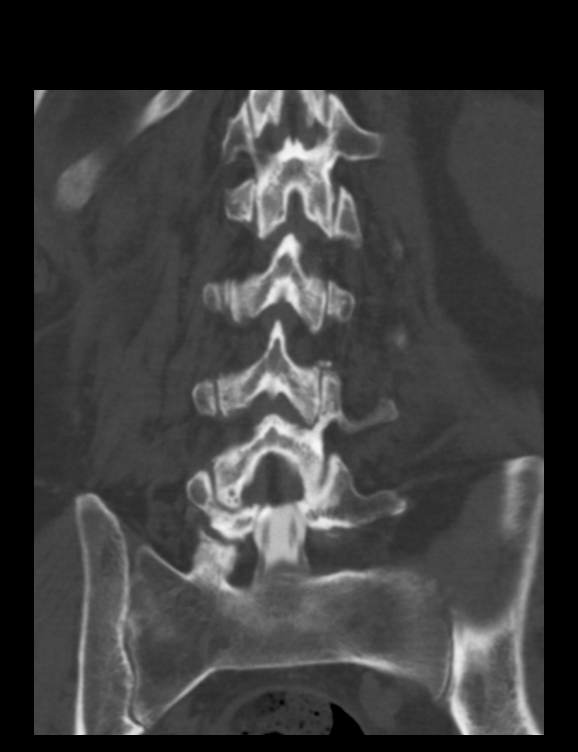
[im 35/58  bone]
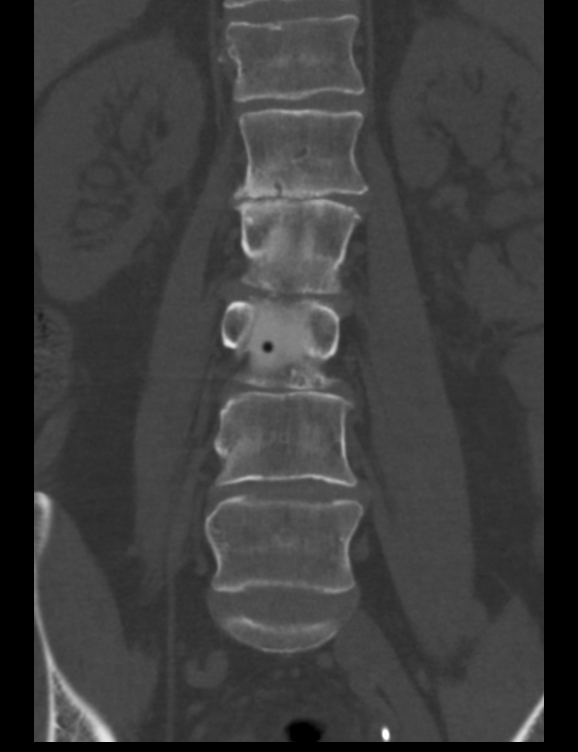

[Series 209: sag std · sagittal · 0.37mm/px · 5 of 56 slices shown, 6 images]
[im 19/56  bone]
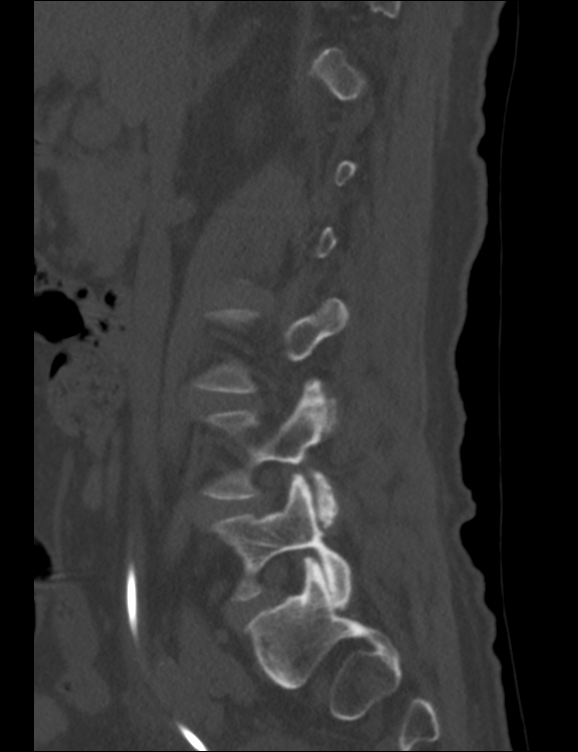
[im 23/56  bone]
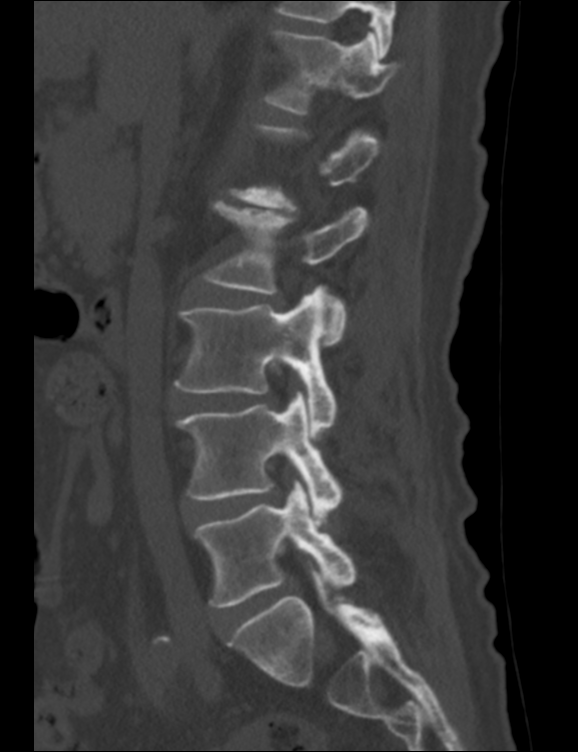
[im 28/56  soft-tissue]
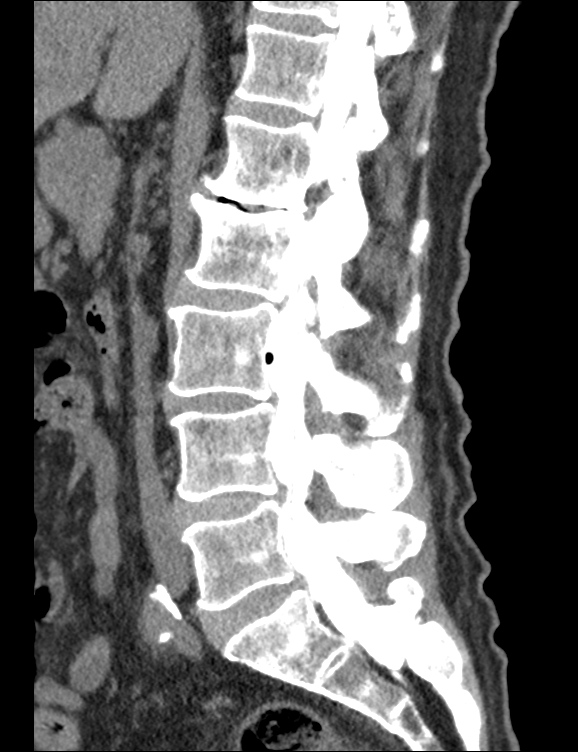
[im 28/56  bone]
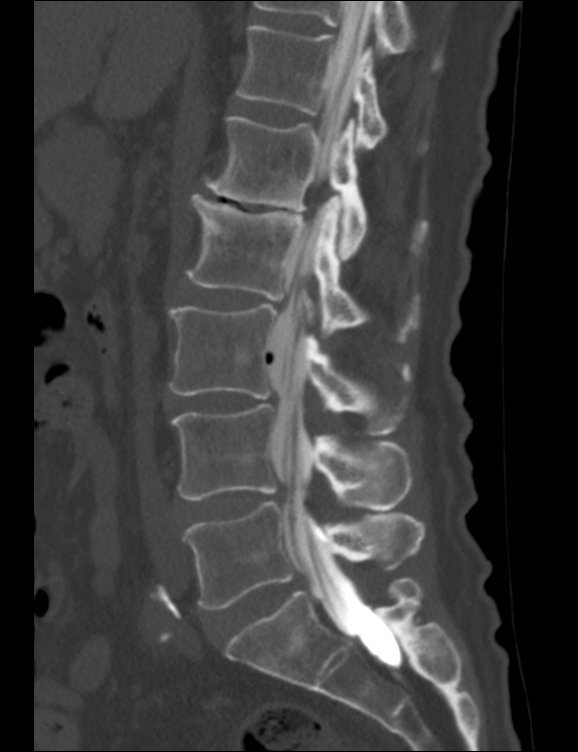
[im 33/56  bone]
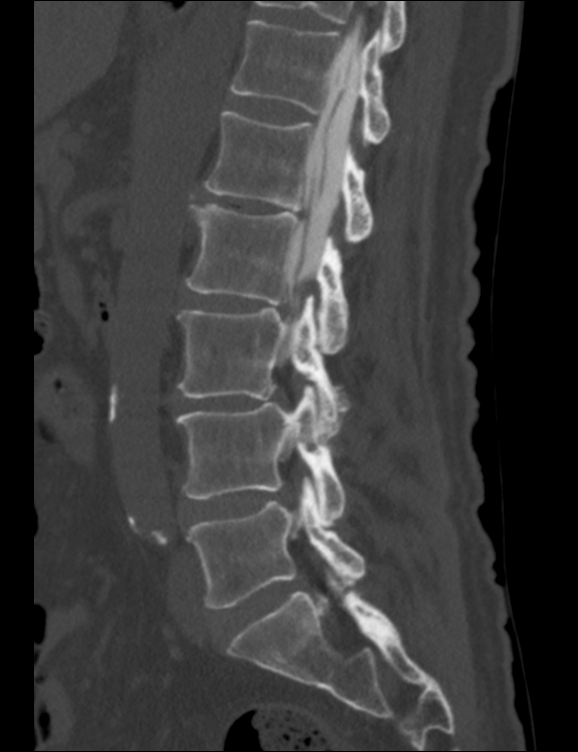
[im 37/56  bone]
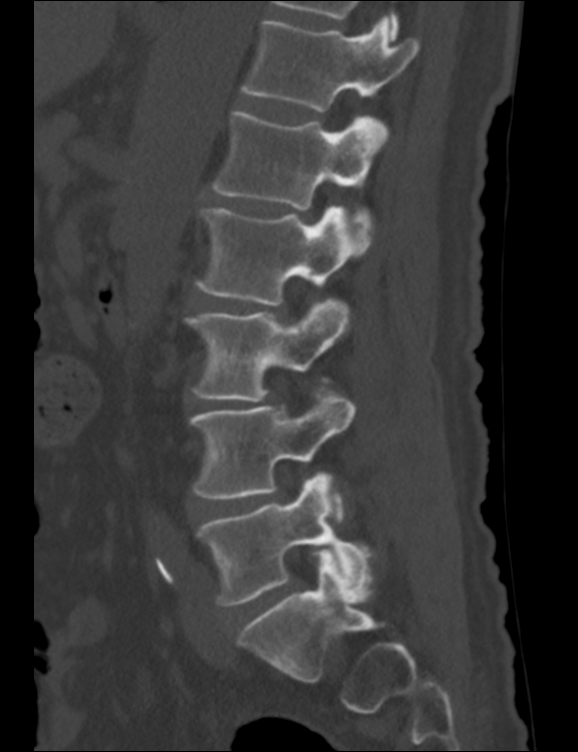

[Series 2010: disc space · axial · 0.37mm/px · z∈[-106,-106]mm · 1 of 8 slices shown, 2 images]
[im 1/8  soft-tissue]
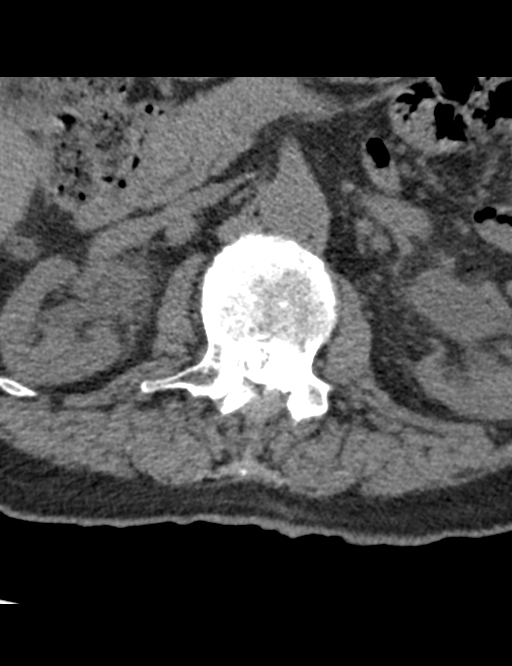
[im 1/8  bone]
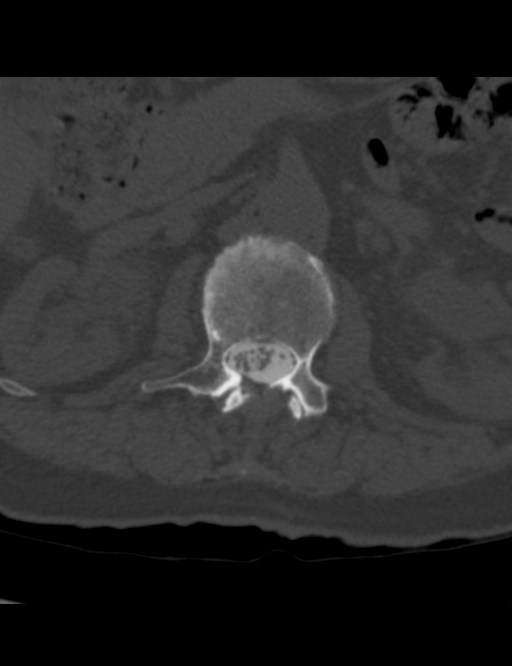

[9 of 33 positions shown; findings below may reference images not displayed]

FINDINGS: LUMBAR MYELOGRAM FINDINGS:

Normal lumbar segmentation

Free-flowing appearance of intrathecal injection. There is a 3 mm
nodular density along the left L5 nerve root at the level of the
L4-5 disc which is a gas bubble based on CT.

Mild L3-4 retrolisthesis which does not change with flexion or
extension.

Degenerative changes are discussed below.

CT LUMBAR MYELOGRAM FINDINGS:

No evidence of fracture, infection, or neoplasm. Small injected gas
bubbles. Normal conus bulk and morphology.

Degenerative changes:

T12- L1: Unremarkable.

L1-L2: Advanced disc narrowing the subchondral erosive changes with
endplate ridging with disc bulging. Mild facet overgrowth. Nerve
roots are somewhat clustered in the right thecal sac without visible
cause. Mild right subarticular recess stenosis.

L2-L3: Disc bulging, greater to the right far-lateral region where
there is L2 contact but no compression. Minimal facet overgrowth. No
impingement.

L3-L4: Slight anterolisthesis. Disc bulging and endplate spurring
greater to the left. Mild to moderate facet overgrowth and articular
surface irregularity. Flattening of the ventral thecal sac
consistent with mild canal and lateral recess stenosis. No nerve
compression. Disc contacts the left L3 nerve within the far-lateral
space without deflection.

L4-L5: Disc narrowing and moderate bulging circumferentially. Mild
facet overgrowth with small subchondral cysts on the right.
Ligamentum flavum hypertrophy. Moderate spinal canal stenosis with
flattening of the thecal sac. Moderate subarticular recess stenosis
with L5 neural contact but no compression.

L5-S1:Mild disc bulging. Moderate facet arthropathy with overgrowth
and subchondral cysts. No impingement.

Bilateral ureteral stents. Moderate bilateral hydronephrosis and
upper hydroureter with periureteric edema, stable from 07/28/2014
CT.
IMPRESSION: LUMBAR MYELOGRAM IMPRESSION:

1. Mild grade 1 anterolisthesis at L3-4.
2. No abnormal motion with flexion or extension.

CT LUMBAR MYELOGRAM IMPRESSION:

1. No high-grade stenosis or impingement to explain progressive
lower extremity symptoms.
2. Up to moderate spinal canal and bilateral subarticular recess
stenosis at L4-5.
3. Noncompressive multilevel disc and facet degeneration is
described above.
4. Chronic moderate bilateral hydronephrosis with ureteral stents in
place.

## 2016-12-14 ENCOUNTER — Other Ambulatory Visit: Payer: Self-pay | Admitting: Nurse Practitioner

## 2018-05-13 ENCOUNTER — Other Ambulatory Visit: Payer: Self-pay | Admitting: *Deleted

## 2020-11-18 ENCOUNTER — Other Ambulatory Visit: Payer: Self-pay | Admitting: Nurse Practitioner
